# Patient Record
Sex: Male | Born: 1955
Health system: Southern US, Community
[De-identification: ages and names within clinical notes are randomized; demographics above are authoritative.]

## PROBLEM LIST (undated history)

## (undated) DIAGNOSIS — I499 Cardiac arrhythmia, unspecified: Secondary | ICD-10-CM

## (undated) DIAGNOSIS — I2510 Atherosclerotic heart disease of native coronary artery without angina pectoris: Secondary | ICD-10-CM

## (undated) DIAGNOSIS — R42 Dizziness and giddiness: Secondary | ICD-10-CM

## (undated) DIAGNOSIS — S72001A Fracture of unspecified part of neck of right femur, initial encounter for closed fracture: Secondary | ICD-10-CM

## (undated) DIAGNOSIS — K7460 Unspecified cirrhosis of liver: Secondary | ICD-10-CM

## (undated) DIAGNOSIS — I10 Essential (primary) hypertension: Secondary | ICD-10-CM

## (undated) DIAGNOSIS — K219 Gastro-esophageal reflux disease without esophagitis: Secondary | ICD-10-CM

## (undated) DIAGNOSIS — M1711 Unilateral primary osteoarthritis, right knee: Secondary | ICD-10-CM

## (undated) DIAGNOSIS — R9431 Abnormal electrocardiogram [ECG] [EKG]: Secondary | ICD-10-CM

## (undated) DIAGNOSIS — I4891 Unspecified atrial fibrillation: Secondary | ICD-10-CM

## (undated) DIAGNOSIS — R7989 Other specified abnormal findings of blood chemistry: Secondary | ICD-10-CM

## (undated) DIAGNOSIS — R945 Abnormal results of liver function studies: Secondary | ICD-10-CM

## (undated) DIAGNOSIS — K769 Liver disease, unspecified: Secondary | ICD-10-CM

## (undated) DIAGNOSIS — M1990 Unspecified osteoarthritis, unspecified site: Secondary | ICD-10-CM

## (undated) DIAGNOSIS — M1710 Unilateral primary osteoarthritis, unspecified knee: Secondary | ICD-10-CM

## (undated) DIAGNOSIS — Z7289 Other problems related to lifestyle: Secondary | ICD-10-CM

## (undated) DIAGNOSIS — D696 Thrombocytopenia, unspecified: Secondary | ICD-10-CM

## (undated) DIAGNOSIS — S72009A Fracture of unspecified part of neck of unspecified femur, initial encounter for closed fracture: Secondary | ICD-10-CM

## (undated) DIAGNOSIS — E785 Hyperlipidemia, unspecified: Secondary | ICD-10-CM

## (undated) HISTORY — DX: Abnormal results of liver function studies: R94.5

## (undated) HISTORY — DX: Fracture of unspecified part of neck of unspecified femur, initial encounter for closed fracture: S72.009A

## (undated) HISTORY — DX: Liver disease, unspecified: K76.9

## (undated) HISTORY — DX: Other specified abnormal findings of blood chemistry: R79.89

## (undated) HISTORY — DX: Essential (primary) hypertension: I10

## (undated) HISTORY — DX: Unilateral primary osteoarthritis, right knee: M17.11

## (undated) HISTORY — DX: Atherosclerotic heart disease of native coronary artery without angina pectoris: I25.10

## (undated) HISTORY — DX: Thrombocytopenia, unspecified: D69.6

## (undated) HISTORY — DX: Fracture of unspecified part of neck of right femur, initial encounter for closed fracture: S72.001A

## (undated) HISTORY — DX: Hyperlipidemia, unspecified: E78.5

## (undated) HISTORY — DX: Other problems related to lifestyle: Z72.89

## (undated) HISTORY — DX: Dizziness and giddiness: R42

## (undated) HISTORY — DX: Abnormal electrocardiogram (ECG) (EKG): R94.31

## (undated) HISTORY — PX: HERNIA REPAIR: SHX51

## (undated) HISTORY — DX: Unilateral primary osteoarthritis, unspecified knee: M17.10

## (undated) HISTORY — PX: FRACTURE SURGERY: SHX138

## (undated) HISTORY — DX: Unspecified cirrhosis of liver: K74.60

## (undated) HISTORY — PX: SHOULDER SURGERY: SHX246

---

## 1998-08-30 ENCOUNTER — Ambulatory Visit (HOSPITAL_BASED_OUTPATIENT_CLINIC_OR_DEPARTMENT_OTHER): Admission: RE | Admit: 1998-08-30 | Discharge: 1998-08-30 | Payer: Self-pay | Admitting: General Surgery

## 2012-05-24 ENCOUNTER — Encounter: Payer: Self-pay | Admitting: Internal Medicine

## 2012-05-24 ENCOUNTER — Encounter: Payer: Self-pay | Admitting: *Deleted

## 2012-05-24 ENCOUNTER — Ambulatory Visit (INDEPENDENT_AMBULATORY_CARE_PROVIDER_SITE_OTHER): Payer: BC Managed Care – PPO | Admitting: Internal Medicine

## 2012-05-24 VITALS — BP 140/84 | HR 68 | Ht 73.0 in | Wt 205.0 lb

## 2012-05-24 DIAGNOSIS — R931 Abnormal findings on diagnostic imaging of heart and coronary circulation: Secondary | ICD-10-CM

## 2012-05-24 DIAGNOSIS — R9389 Abnormal findings on diagnostic imaging of other specified body structures: Secondary | ICD-10-CM

## 2012-05-24 DIAGNOSIS — I10 Essential (primary) hypertension: Secondary | ICD-10-CM

## 2012-05-24 LAB — LIPID PANEL
Cholesterol: 226 mg/dL — ABNORMAL HIGH (ref 0–200)
HDL: 107.3 mg/dL (ref >39.00)
Total CHOL/HDL Ratio: 2
Triglycerides: 51 mg/dL (ref 0.0–149.0)
VLDL: 10.2 mg/dL (ref 0.0–40.0)

## 2012-05-24 LAB — LDL CHOLESTEROL, DIRECT: Direct LDL: 108.9 mg/dL

## 2012-05-24 NOTE — Progress Notes (Signed)
HPI Patient is a 57 yo wh was referred from [facility] family medicine for cardiac evaluation. The patient has a histroy of HTN  He was seen in February.  EKG was done that was felt to be abnormal. Cardiac CT was done that showed a calcium score of 784.  LAD was 553; LCx was 295  LM 26.  The patient denies CP  No SOB  He is active.  No Known Allergies  Current Outpatient Prescriptions  Medication Sig Dispense Refill  . aspirin 81 MG tablet Take 81 mg by mouth daily.      . naproxen sodium (ANAPROX) 220 MG tablet Take 220 mg by mouth 2 (two) times daily with a meal.      . amLODipine-benazepril (LOTREL) 5-20 MG per capsule Take 1 capsule by mouth daily.       No current facility-administered medications for this visit.    Past Medical History  Diagnosis Date  . Hypertension   . Elevated liver function tests   . Chronic liver disease   . Abnormal EKG   . Dizziness     No past surgical history on file.  No family history on file.  History   Social History  . Marital Status: Divorced    Spouse Name: N/A    Number of Children: N/A  . Years of Education: N/A   Occupational History  . Not on file.   Social History Main Topics  . Smoking status: Never Smoker   . Smokeless tobacco: Not on file  . Alcohol Use: Not on file  . Drug Use: Not on file  . Sexually Active: Not on file   Other Topics Concern  . Not on file   Social History Narrative  . No narrative on file    Review of Systems:  All systems reviewed.  They are negative to the above problem except as previously stated.  Vital Signs: BP 140/84  Pulse 68  Ht 6\' 1"  (1.854 m)  Wt 205 lb (92.987 kg)  BMI 27.05 kg/m2  Physical Exam Patient is in NAD HEENT:  Normocephalic, atraumatic. EOMI, PERRLA.  Neck: JVP is normal.  No bruits.  Lungs: clear to auscultation. No rales no wheezes.  Heart: Regular rate and rhythm. Normal S1, S2. No S3.   No significant murmurs. PMI not displaced.  Abdomen:  Supple, nontender.  Normal bowel sounds. No masses. No hepatomegaly.  Extremities:   Good distal pulses throughout. No lower extremity edema.  Musculoskeletal :moving all extremities.  Neuro:   alert and oriented x3.  CN II-XII grossly intact.  EKG  SR 68 bpm.  Assessment and Plan:  1.  CAD  The patient's recent CT shows indeed that he has CAD  He is asymptomatic. I would recomm a stress myoview to evaluate for ischemia  2.  HTN  Continue meds  3.  HL  WIll need fasting lipids today  He had coffee this AM.

## 2012-05-24 NOTE — Patient Instructions (Addendum)
LABS TODAY:  Lipid Panel  Your physician has requested that you have an exercise stress myoview. For further information please visit [email or web address] Please follow instruction sheet, as given.

## 2012-05-26 ENCOUNTER — Other Ambulatory Visit: Payer: Self-pay | Admitting: *Deleted

## 2012-05-26 ENCOUNTER — Telehealth: Payer: Self-pay | Admitting: Internal Medicine

## 2012-05-26 ENCOUNTER — Encounter: Payer: Self-pay | Admitting: Internal Medicine

## 2012-05-26 DIAGNOSIS — E785 Hyperlipidemia, unspecified: Secondary | ICD-10-CM

## 2012-05-26 MED ORDER — ATORVASTATIN CALCIUM 20 MG PO TABS: 20.0000 mg | ORAL_TABLET | Freq: Every day | ORAL | Status: DC

## 2012-05-26 NOTE — Telephone Encounter (Signed)
New Problem:    Patient's wife called in wanting to know if Dr. Tenny Craw had decided to order cholesterolol medication for her husband.  Please call back.

## 2012-05-26 NOTE — Telephone Encounter (Signed)
See lab results.  

## 2012-06-08 ENCOUNTER — Ambulatory Visit (HOSPITAL_COMMUNITY): Payer: BC Managed Care – PPO | Attending: Cardiology | Admitting: Radiology

## 2012-06-08 VITALS — BP 161/76 | HR 70 | Ht 73.0 in | Wt 200.0 lb

## 2012-06-08 DIAGNOSIS — R931 Abnormal findings on diagnostic imaging of heart and coronary circulation: Secondary | ICD-10-CM

## 2012-06-08 DIAGNOSIS — R42 Dizziness and giddiness: Secondary | ICD-10-CM | POA: Insufficient documentation

## 2012-06-08 DIAGNOSIS — R9439 Abnormal result of other cardiovascular function study: Secondary | ICD-10-CM

## 2012-06-08 DIAGNOSIS — E785 Hyperlipidemia, unspecified: Secondary | ICD-10-CM | POA: Insufficient documentation

## 2012-06-08 DIAGNOSIS — R9389 Abnormal findings on diagnostic imaging of other specified body structures: Secondary | ICD-10-CM | POA: Insufficient documentation

## 2012-06-08 DIAGNOSIS — I10 Essential (primary) hypertension: Secondary | ICD-10-CM | POA: Insufficient documentation

## 2012-06-08 MED ORDER — TECHNETIUM TC 99M SESTAMIBI GENERIC - CARDIOLITE
11.0000 | Freq: Once | INTRAVENOUS | Status: AC | PRN
  Administered 2012-06-08: 11 via INTRAVENOUS

## 2012-06-08 MED ORDER — TECHNETIUM TC 99M SESTAMIBI GENERIC - CARDIOLITE
33.0000 | Freq: Once | INTRAVENOUS | Status: AC | PRN
  Administered 2012-06-08: 33 via INTRAVENOUS

## 2012-06-08 NOTE — Progress Notes (Signed)
[facility] SITE 3 NUCLEAR MED [address] [place], [place] [address] [phone]    Cardiology Nuclear Med Study  EESA JUSTISS is a 57 y.o. male     MRN : [id number]     DOB: 1956/02/11  Procedure Date: 06/08/2012  Nuclear Med Background Indication for Stress Test:  Evaluation for Ischemia, and Abnormal Cardiac CT History:  2/14 Cardiac OZ:HYQMVHQ score=784 with calcification in LAD, LCX and LM Cardiac Risk Factors: Hypertension and Lipids  Symptoms:  No cardiac complaints, other than dizziness he relates to his BP medicine.   Nuclear Pre-Procedure Caffeine/Decaff Intake:  None > 12 hrs NPO After: 8:00pm   Lungs:  Clear. O2 Sat: 97% on room air. IV 0.9% NS with Angio Cath:  20g  IV Site: R Antecubital x 1, tolerated well IV Started by:  Irean Hong, RN  Chest Size (in):  42 Cup Size: n/a  Height: 6\' 1"  (1.854 m)  Weight:  200 lb (90.719 kg)  BMI:  Body mass index is 26.39 kg/(m^2). Tech Comments:  n/a    Nuclear Med Study 1 or 2 day study: 1 day  Stress Test Type:  Stress  Reading MD: Marca Ancona, MD  Order Authorizing Provider:  Dietrich Pates, MD  Resting Radionuclide: Technetium 67m Sestamibi  Resting Radionuclide Dose: 11.0 mCi   Stress Radionuclide:  Technetium 47m Sestamibi  Stress Radionuclide Dose: 33.0 mCi           Stress Protocol Rest HR: 70 Stress HR: 144  Rest BP: 161/76 Stress BP: 207/85  Exercise Time (min): 4:00 METS: 5.3   Predicted Max HR: 163 bpm % Max HR: 88.34 bpm Rate Pressure Product: 46962   Dose of Adenosine (mg):  n/a Dose of Lexiscan: n/a mg  Dose of Atropine (mg): n/a Dose of Dobutamine: n/a mcg/kg/min (at max HR)  Stress Test Technologist: Smiley Houseman, CMA-N  Nuclear Technologist:  Domenic Polite, CNMT     Rest Procedure:  Myocardial perfusion imaging was performed at rest 45 minutes following the intravenous administration of Technetium 58m Sestamibi.  Rest ECG: NSR - Normal EKG  Stress Procedure:  The patient  exercised on the treadmill utilizing the Bruce Protocol for four minutes. The patient stopped due to right knee pain and denied any chest pain.  Technetium 20m Sestamibi was injected at peak exercise and myocardial perfusion imaging was performed after a brief delay.  Stress ECG: No significant change from baseline ECG  QPS Raw Data Images:  Normal; no motion artifact; normal heart/lung ratio. Stress Images:  Small, mild basal inferior perfusion defect.  Rest Images:  Small, mild basal inferior perfusion defect.  Subtraction (SDS):  Fixed small, mild basal inferior perfusion defect.  Transient Ischemic Dilatation (Normal <1.22):  0.89 Lung/Heart Ratio (Normal <0.45):  0.32  Quantitative Gated Spect Images QGS EDV:  132 ml QGS ESV:  49 ml  Impression Exercise Capacity:  Poor exercise capacity. BP Response:  Hypertensive blood pressure response. Clinical Symptoms:  Knee pain ECG Impression:  No significant ST segment change suggestive of ischemia. Comparison with Prior Nuclear Study: No previous nuclear study performed  Overall Impression:  Low risk stress nuclear study.  Fixed small mild basal inferior perfusion defect with normal wall motion.  Suspect this is most likely diaphragmatic attenuation.  No ischemia.   LV Ejection Fraction: 63%.  LV Wall Motion:  NL LV Function; NL Wall Motion  Marca Ancona 06/08/2012

## 2012-06-15 ENCOUNTER — Telehealth: Payer: Self-pay | Admitting: Internal Medicine

## 2012-06-15 NOTE — Telephone Encounter (Signed)
New problem:  Test results.  

## 2012-06-16 NOTE — Telephone Encounter (Signed)
Pt.notified

## 2012-07-22 ENCOUNTER — Other Ambulatory Visit (INDEPENDENT_AMBULATORY_CARE_PROVIDER_SITE_OTHER): Payer: BC Managed Care – PPO

## 2012-07-22 DIAGNOSIS — E785 Hyperlipidemia, unspecified: Secondary | ICD-10-CM

## 2012-07-22 LAB — LIPID PANEL
Cholesterol: 209 mg/dL — ABNORMAL HIGH (ref 0–200)
HDL: 140.1 mg/dL (ref >39.00)
Total CHOL/HDL Ratio: 1
Triglycerides: 36 mg/dL (ref 0.0–149.0)
VLDL: 7.2 mg/dL (ref 0.0–40.0)

## 2012-07-22 LAB — AST: AST: 97 U/L — ABNORMAL HIGH (ref 0–37)

## 2012-07-22 LAB — LDL CHOLESTEROL, DIRECT: Direct LDL: 58.5 mg/dL

## 2012-07-29 ENCOUNTER — Telehealth: Payer: Self-pay | Admitting: Internal Medicine

## 2012-07-29 ENCOUNTER — Other Ambulatory Visit: Payer: Self-pay | Admitting: *Deleted

## 2012-07-29 DIAGNOSIS — E785 Hyperlipidemia, unspecified: Secondary | ICD-10-CM

## 2012-07-29 MED ORDER — ATORVASTATIN CALCIUM 20 MG PO TABS: 10.0000 mg | ORAL_TABLET | Freq: Every day | ORAL | Status: DC

## 2012-07-29 NOTE — Telephone Encounter (Signed)
Spoke with pt

## 2012-07-29 NOTE — Telephone Encounter (Signed)
New problem   Pt want to know if his atorvastatin is going to be change. Please call pt

## 2014-02-18 ENCOUNTER — Encounter (HOSPITAL_COMMUNITY): Payer: Self-pay | Admitting: Emergency Medicine

## 2014-02-18 ENCOUNTER — Emergency Department (HOSPITAL_COMMUNITY): Payer: BC Managed Care – PPO

## 2014-02-18 ENCOUNTER — Inpatient Hospital Stay (HOSPITAL_COMMUNITY)
Admission: EM | Admit: 2014-02-18 | Discharge: 2014-02-20 | DRG: 482 | Disposition: A | Discharge status: Home / Self Care | Payer: BC Managed Care – PPO | Attending: Internal Medicine | Admitting: Internal Medicine

## 2014-02-18 DIAGNOSIS — F1090 Alcohol use, unspecified, uncomplicated: Secondary | ICD-10-CM | POA: Diagnosis present

## 2014-02-18 DIAGNOSIS — S72009A Fracture of unspecified part of neck of unspecified femur, initial encounter for closed fracture: Secondary | ICD-10-CM

## 2014-02-18 DIAGNOSIS — R9431 Abnormal electrocardiogram [ECG] [EKG]: Secondary | ICD-10-CM | POA: Diagnosis present

## 2014-02-18 DIAGNOSIS — F1099 Alcohol use, unspecified with unspecified alcohol-induced disorder: Secondary | ICD-10-CM

## 2014-02-18 DIAGNOSIS — Z419 Encounter for procedure for purposes other than remedying health state, unspecified: Secondary | ICD-10-CM

## 2014-02-18 DIAGNOSIS — Z789 Other specified health status: Secondary | ICD-10-CM | POA: Diagnosis present

## 2014-02-18 DIAGNOSIS — S72001A Fracture of unspecified part of neck of right femur, initial encounter for closed fracture: Secondary | ICD-10-CM | POA: Diagnosis present

## 2014-02-18 DIAGNOSIS — K769 Liver disease, unspecified: Secondary | ICD-10-CM | POA: Diagnosis present

## 2014-02-18 DIAGNOSIS — S72141A Displaced intertrochanteric fracture of right femur, initial encounter for closed fracture: Secondary | ICD-10-CM | POA: Diagnosis present

## 2014-02-18 DIAGNOSIS — Z7982 Long term (current) use of aspirin: Secondary | ICD-10-CM

## 2014-02-18 DIAGNOSIS — W000XXA Fall on same level due to ice and snow, initial encounter: Secondary | ICD-10-CM | POA: Diagnosis present

## 2014-02-18 DIAGNOSIS — I10 Essential (primary) hypertension: Secondary | ICD-10-CM | POA: Diagnosis present

## 2014-02-18 DIAGNOSIS — Z7289 Other problems related to lifestyle: Secondary | ICD-10-CM | POA: Diagnosis present

## 2014-02-18 DIAGNOSIS — K5900 Constipation, unspecified: Secondary | ICD-10-CM | POA: Diagnosis present

## 2014-02-18 HISTORY — DX: Fracture of unspecified part of neck of right femur, initial encounter for closed fracture: S72.001A

## 2014-02-18 HISTORY — DX: Other specified health status: Z78.9

## 2014-02-18 HISTORY — DX: Other problems related to lifestyle: Z72.89

## 2014-02-18 HISTORY — DX: Unspecified osteoarthritis, unspecified site: M19.90

## 2014-02-18 HISTORY — DX: Alcohol use, unspecified, uncomplicated: F10.90

## 2014-02-18 LAB — I-STAT CHEM 8, ED
BUN: 15 mg/dL (ref 6–23)
Calcium, Ion: 1.27 mmol/L — ABNORMAL HIGH (ref 1.12–1.23)
Chloride: 106 mEq/L (ref 96–112)
Creatinine, Ser: 0.6 mg/dL (ref 0.50–1.35)
Glucose, Bld: 116 mg/dL — ABNORMAL HIGH (ref 70–99)
HCT: 41 % (ref 39.0–52.0)
Hemoglobin: 13.9 g/dL (ref 13.0–17.0)
Potassium: 4.3 mmol/L (ref 3.5–5.1)
Sodium: 139 mmol/L (ref 135–145)
TCO2: 20 mmol/L (ref 0–100)

## 2014-02-18 LAB — URINALYSIS, ROUTINE W REFLEX MICROSCOPIC
Bilirubin Urine: NEGATIVE
Glucose, UA: NEGATIVE mg/dL
Hgb urine dipstick: NEGATIVE
Ketones, ur: 15 mg/dL — AB
Leukocytes, UA: NEGATIVE
Nitrite: NEGATIVE
Protein, ur: NEGATIVE mg/dL
Specific Gravity, Urine: 1.016 (ref 1.005–1.030)
Urobilinogen, UA: 0.2 mg/dL (ref 0.0–1.0)
pH: 6 (ref 5.0–8.0)

## 2014-02-18 LAB — BASIC METABOLIC PANEL
Anion gap: 9 (ref 5–15)
BUN: 10 mg/dL (ref 6–23)
CO2: 22 mmol/L (ref 19–32)
Calcium: 9.2 mg/dL (ref 8.4–10.5)
Chloride: 106 mEq/L (ref 96–112)
Creatinine, Ser: 0.66 mg/dL (ref 0.50–1.35)
GFR calc Af Amer: >90 mL/min (ref >90)
GFR calc non Af Amer: >90 mL/min (ref >90)
Glucose, Bld: 110 mg/dL — ABNORMAL HIGH (ref 70–99)
Potassium: 4.2 mmol/L (ref 3.5–5.1)
Sodium: 137 mmol/L (ref 135–145)

## 2014-02-18 LAB — CBC WITH DIFFERENTIAL/PLATELET
Basophils Absolute: 0 10*3/uL (ref 0.0–0.1)
Basophils Relative: 0 % (ref 0–1)
Eosinophils Absolute: 0.2 10*3/uL (ref 0.0–0.7)
Eosinophils Relative: 2 % (ref 0–5)
HCT: 38.1 % — ABNORMAL LOW (ref 39.0–52.0)
Hemoglobin: 13 g/dL (ref 13.0–17.0)
Lymphocytes Relative: 18 % (ref 12–46)
Lymphs Abs: 1.2 10*3/uL (ref 0.7–4.0)
MCH: 31.1 pg (ref 26.0–34.0)
MCHC: 34.1 g/dL (ref 30.0–36.0)
MCV: 91.1 fL (ref 78.0–100.0)
Monocytes Absolute: 0.7 10*3/uL (ref 0.1–1.0)
Monocytes Relative: 10 % (ref 3–12)
Neutro Abs: 4.5 10*3/uL (ref 1.7–7.7)
Neutrophils Relative %: 70 % (ref 43–77)
Platelets: 104 10*3/uL — ABNORMAL LOW (ref 150–400)
RBC: 4.18 MIL/uL — ABNORMAL LOW (ref 4.22–5.81)
RDW: 13.9 % (ref 11.5–15.5)
WBC: 6.5 10*3/uL (ref 4.0–10.5)

## 2014-02-18 LAB — COMPREHENSIVE METABOLIC PANEL
ALT: 29 U/L (ref 0–53)
AST: 35 U/L (ref 0–37)
Albumin: 3.5 g/dL (ref 3.5–5.2)
Alkaline Phosphatase: 103 U/L (ref 39–117)
Anion gap: 8 (ref 5–15)
BUN: 11 mg/dL (ref 6–23)
CO2: 23 mmol/L (ref 19–32)
Calcium: 9.4 mg/dL (ref 8.4–10.5)
Chloride: 108 mEq/L (ref 96–112)
Creatinine, Ser: 0.68 mg/dL (ref 0.50–1.35)
GFR calc Af Amer: >90 mL/min (ref >90)
GFR calc non Af Amer: >90 mL/min (ref >90)
Glucose, Bld: 111 mg/dL — ABNORMAL HIGH (ref 70–99)
Potassium: 4.2 mmol/L (ref 3.5–5.1)
Sodium: 139 mmol/L (ref 135–145)
Total Bilirubin: 1.1 mg/dL (ref 0.3–1.2)
Total Protein: 6.2 g/dL (ref 6.0–8.3)

## 2014-02-18 LAB — TYPE AND SCREEN
ABO/RH(D): B POS
Antibody Screen: NEGATIVE

## 2014-02-18 LAB — PROTIME-INR
INR: 1.32 (ref 0.00–1.49)
Prothrombin Time: 16.5 seconds — ABNORMAL HIGH (ref 11.6–15.2)

## 2014-02-18 MED ORDER — SODIUM CHLORIDE 0.9 % IV SOLN
INTRAVENOUS | Status: DC
  Administered 2014-02-18 – 2014-02-20 (×2): 75 mL/h via INTRAVENOUS

## 2014-02-18 MED ORDER — ONDANSETRON HCL 4 MG PO TABS: 4.0000 mg | ORAL_TABLET | Freq: Four times a day (QID) | ORAL | Status: DC | PRN

## 2014-02-18 MED ORDER — ONDANSETRON 4 MG PO TBDP
4.0000 mg | ORAL_TABLET | Freq: Once | ORAL | Status: AC
  Administered 2014-02-18: 4 mg via ORAL
  Filled 2014-02-18: qty 1

## 2014-02-18 MED ORDER — OXYCODONE HCL 5 MG PO TABS
5.0000 mg | ORAL_TABLET | ORAL | Status: DC | PRN
  Administered 2014-02-19: 5 mg via ORAL
  Filled 2014-02-18: qty 1

## 2014-02-18 MED ORDER — ACETAMINOPHEN 650 MG RE SUPP: 650.0000 mg | Freq: Four times a day (QID) | RECTAL | Status: DC | PRN

## 2014-02-18 MED ORDER — ALUM & MAG HYDROXIDE-SIMETH 200-200-20 MG/5ML PO SUSP: 30.0000 mL | Freq: Four times a day (QID) | ORAL | Status: DC | PRN

## 2014-02-18 MED ORDER — ACETAMINOPHEN 325 MG PO TABS: 650.0000 mg | ORAL_TABLET | Freq: Four times a day (QID) | ORAL | Status: DC | PRN

## 2014-02-18 MED ORDER — HYDRALAZINE HCL 20 MG/ML IJ SOLN: 10.0000 mg | Freq: Four times a day (QID) | INTRAMUSCULAR | Status: DC | PRN

## 2014-02-18 MED ORDER — ONDANSETRON HCL 4 MG/2ML IJ SOLN
4.0000 mg | Freq: Four times a day (QID) | INTRAMUSCULAR | Status: DC | PRN
  Administered 2014-02-18 – 2014-02-19 (×2): 4 mg via INTRAVENOUS
  Filled 2014-02-18: qty 2

## 2014-02-18 MED ORDER — ATORVASTATIN CALCIUM 10 MG PO TABS
10.0000 mg | ORAL_TABLET | Freq: Every day | ORAL | Status: DC
  Administered 2014-02-20: 10 mg via ORAL
  Filled 2014-02-18 (×2): qty 1

## 2014-02-18 MED ORDER — HYDROCODONE-ACETAMINOPHEN 5-325 MG PO TABS
2.0000 | ORAL_TABLET | Freq: Once | ORAL | Status: AC
  Administered 2014-02-18: 2 via ORAL
  Filled 2014-02-18: qty 2

## 2014-02-18 MED ORDER — HYDROMORPHONE HCL 1 MG/ML IJ SOLN
0.5000 mg | INTRAMUSCULAR | Status: DC | PRN
  Administered 2014-02-18 – 2014-02-19 (×3): 1 mg via INTRAVENOUS
  Filled 2014-02-18 (×3): qty 1

## 2014-02-18 NOTE — ED Notes (Signed)
Per EMS, pt slipped and fell yesterday. Was working in yard today and felt R hip pop and had trouble bearing weight after that.

## 2014-02-18 NOTE — ED Provider Notes (Signed)
CSN: [id number]     Arrival date & time 02/18/14  1700 History   First MD Initiated Contact with Patient 02/18/14 1721     Chief Complaint  Patient presents with  . Hip Pain  . Fall     (Consider location/radiation/quality/duration/timing/severity/associated sxs/prior Treatment) HPI   Social 58 year old male who presents the emergency department with chief complaint of hip pain. Patient states that 2 nights ago he slipped coming in the door and fell directly onto his right hip and right elbow. Patient states he was sore and had pain, however, he was able to ambulate. Yesterday the patient was outside moving a large rock in his front yard when he lifted, he felt a pop in his right leg. He had immediate severe pain and was unable to ambulate on the leg. The patient states that his riding lawnmower was nearby and he rode it toward the house. He said his pain was diminished somewhat and he got up to stand on the leg when it popped again. Patient held onto the lawn mower and called for his wife who brought him up here for crutches. The patient recently had a left knee arthroscopy and had left over Norco which he took that helped relieve his pain somewhat. Pain is worse with movements of the hip. He has pain on the lateral hip as well as radiating into the left groin. He is still unable to bear weight on the hip. He denies any numbness or tingling. He has a history of right knee osteoarthritis as scheduled for arthroscopy.  Past Medical History  Diagnosis Date  . Hypertension   . Elevated liver function tests   . Chronic liver disease   . Abnormal EKG   . Dizziness    History reviewed. No pertinent past surgical history. History reviewed. No pertinent family history. History  Substance Use Topics  . Smoking status: Never Smoker   . Smokeless tobacco: Not on file  . Alcohol Use: Not on file    Review of Systems  Ten systems reviewed and are negative for acute change, except as noted in  the HPI.   Allergies  Review of patient's allergies indicates no known allergies.  Home Medications   Prior to Admission medications   Medication Sig Start Date End Date Taking? Authorizing Provider  amLODipine-benazepril (LOTREL) 5-20 MG per capsule Take 1 capsule by mouth daily.   Yes Historical Provider, MD  aspirin 81 MG tablet Take 81 mg by mouth daily.   Yes Historical Provider, MD  atorvastatin (LIPITOR) 10 MG tablet Take 10 mg by mouth daily. 11/21/13  Yes Historical Provider, MD  HYDROcodone-acetaminophen (NORCO/VICODIN) 5-325 MG per tablet Take 1-2 tablets by mouth every 6 (six) hours as needed for moderate pain.   Yes Historical Provider, MD  meloxicam (MOBIC) 15 MG tablet Take 15 mg by mouth daily.   Yes Historical Provider, MD  naproxen sodium (ANAPROX) 220 MG tablet Take 440 mg by mouth 2 (two) times daily with a meal.    Yes Historical Provider, MD  atorvastatin (LIPITOR) 20 MG tablet Take 0.5 tablets (10 mg total) by mouth daily. Patient not taking: Reported on 02/18/2014 07/29/12   Fay Records, MD   BP 170/86 mmHg  Pulse 89  SpO2 97% Physical Exam  Constitutional: He appears well-developed and well-nourished. No distress.  HENT:  Head: Normocephalic and atraumatic.  Eyes: Conjunctivae are normal. No scleral icterus.  Neck: Normal range of motion. Neck supple.  Cardiovascular: Normal rate, regular rhythm and  normal heart sounds.   Pulmonary/Chest: Effort normal and breath sounds normal. No respiratory distress.  Abdominal: Soft. There is no tenderness.  Musculoskeletal: He exhibits no edema.       Legs: Right hip examination is performed. There is a 2 inch right leg length inequality with the right leg being shorter. Patient is unsure if this is old or new. There is no tenderness to palpation of the hip, however there is pain with internal and external rotation and flexion. Pain is worse with extension of the hip. Pulses and sensation intact. Strength is decreased due  to pain.  Neurological: He is alert.  Skin: Skin is warm and dry. He is not diaphoretic.  Psychiatric: His behavior is normal.  Nursing note and vitals reviewed.   ED Course  Procedures (including critical care time) Labs Review Labs Reviewed - No data to display  Imaging Review No results found.   EKG Interpretation None      MDM   Final diagnoses:  None    Patient here with complaint of right hip pain after injury. We'll obtain a hip x-ray. I have given the patient pain medication.  8:42 PM Patient with intertrochanteric right hip fracture. I spoken with Dr. Tamera Punt, who is on call for [facility] with the patient is established as a patient. He asked that the patient remain nothing by mouth after midnight and he will see the patient first thing in the morning to schedule his surgery for tomorrow. I have discussed the findings with the patient who understands his diagnosis and agrees with plan of care. Patient's other labs are currently pending.  ? Pathologic fracture.  Patient admitted by Dr. Arnoldo Morale. I have discussed the patient with Dr. Canary Brim. Patient admitted. To medsurege. I personally reviewed the imaging tests through PACS system. I have reviewed and interpreted Lab values. I reviewed available ER/hospitalization records through the EMR  The patient appears reasonably stabilized for admission considering the current resources, flow, and capabilities available in the ED at this time, and I doubt any other [facility] requiring further screening and/or treatment in the ED prior to admission.   Margarita Mail, PA-C 02/19/14 [place], MD 02/19/14 [phone]

## 2014-02-18 NOTE — H&P (Signed)
[facility] Admission History and Physical       DESTAN FRANCHINI QVZ:[id number] DOB: 1956/02/07 DOA: 02/18/2014  Referring physician: EDP PCP: Vicenta Aly, [place]  Specialists:   Chief Complaint: Right Hip Pain    HPI: MATIN MATTIOLI is a 58 y.o. male with a history of HTN who presents to the ED with increased Right Hip pain and being unable to bear weight on his right leg today since an initial fall 2 days ago.   He reports the fall occurred when he slipped on his wet porch and fell onto his right side. He reports having pain on his right hip and elbow, but he was able to walk  At that time.  He reports that he was working in his yard today and he was squatting to pick up a boulder in his yard and when he did he heard a pop in his hip and he had increased pain.   He straightened up and heard another pop and head increased pain in his right hip  and could not bear weight on his right leg and foot.   He reports that he got on his riding Conservation officer, nature and rode back to his house and told his wife that he needed to go to the hospital.    He was evaluated in the ED and was found to have a right Intertrochanteric fracture and Orthopedics on Call for Dr. [name] were contacted for consultation, and the plan is for him to be NPO after midnight for surgery in the AM.        Review of Systems:  Constitutional: No Weight Loss, No Weight Gain, Night Sweats, Fevers, Chills, Dizziness, Fatigue, or Generalized Weakness HEENT: No Headaches, Difficulty Swallowing,Tooth/Dental Problems,Sore Throat,  No Sneezing, Rhinitis, Ear Ache, Nasal Congestion, or Post Nasal Drip,  Cardio-vascular:  No Chest pain, Orthopnea, PND, Edema in Lower Extremities, Anasarca, Dizziness, Palpitations  Resp: No Dyspnea, No DOE, No Productive Cough, No Non-Productive Cough, No Hemoptysis, No Wheezing.    GI: No Heartburn, Indigestion, Abdominal Pain, Nausea, Vomiting, Diarrhea, Hematemesis, Hematochezia, Melena,  Change in Bowel Habits,  Loss of Appetite  GU: No Dysuria, Change in Color of Urine, No Urgency or Frequency, No Flank pain.  Musculoskeletal: +Right Hip Pain, No Decreased Range of Motion, No Back Pain.  Neurologic: No Syncope, No Seizures, Muscle Weakness, Paresthesia, Vision Disturbance or Loss, No Diplopia, No Vertigo, No Difficulty Walking,  Skin: No Rash or Lesions. Psych: No Change in Mood or Affect, No Depression or Anxiety, No Memory loss, No Confusion, or Hallucinations   Past Medical History  Diagnosis Date  . Hypertension   . Elevated liver function tests   . Chronic liver disease   . Abnormal EKG   . Dizziness      History reviewed. No pertinent past surgical history.     Prior to Admission medications   Medication Sig Start Date End Date Taking? Authorizing Provider  amLODipine-benazepril (LOTREL) 5-20 MG per capsule Take 1 capsule by mouth daily.   Yes Historical Provider, MD  aspirin 81 MG tablet Take 81 mg by mouth daily.   Yes Historical Provider, MD  atorvastatin (LIPITOR) 10 MG tablet Take 10 mg by mouth daily. 11/21/13  Yes Historical Provider, MD  HYDROcodone-acetaminophen (NORCO/VICODIN) 5-325 MG per tablet Take 1-2 tablets by mouth every 6 (six) hours as needed for moderate pain.   Yes Historical Provider, MD  meloxicam (MOBIC) 15 MG tablet Take 15 mg by mouth daily.   Yes Historical  Provider, MD  naproxen sodium (ANAPROX) 220 MG tablet Take 440 mg by mouth 2 (two) times daily with a meal.    Yes Historical Provider, MD  atorvastatin (LIPITOR) 20 MG tablet Take 0.5 tablets (10 mg total) by mouth daily. Patient not taking: Reported on 02/18/2014 07/29/12   Fay Records, MD     No Known Allergies   Social History:  reports that he has never smoked. He does not have any smokeless tobacco history on file. His alcohol and drug histories are not on file.     History reviewed. No pertinent family history.     Physical Exam:  GEN:  Pleasant Well Nourished  and Well Developed 58 y.o. male  examined  and in no acute distress; cooperative with exam Filed Vitals:   02/18/14 1704 02/18/14 1829 02/18/14 1915 02/18/14 2130  BP:  146/83 130/83 142/81  Pulse: 89 69 69 70  Resp:  15    SpO2: 97% 97% 95% 96%   Blood pressure 142/81, pulse 70, resp. rate 15, SpO2 96 %. PSYCH: He is alert and oriented x4; does not appear anxious does not appear depressed; affect is normal HEENT: Normocephalic and Atraumatic, Mucous membranes pink; PERRLA; EOM intact; Fundi:  Benign;  No scleral icterus, Nares: Patent, Oropharynx: Clear, Fair Dentition,    Neck:  FROM, No Cervical Lymphadenopathy nor Thyromegaly or Carotid Bruit; No JVD; Breasts:: Not examined CHEST WALL: No tenderness CHEST: Normal respiration, clear to auscultation bilaterally HEART: Regular rate and rhythm; no murmurs rubs or gallops BACK: No kyphosis or scoliosis; No CVA tenderness ABDOMEN: Positive Bowel Sounds, Obese, Soft Non-Tender; No Masses, No Organomegaly, No Pannus; No Intertriginous candida. Rectal Exam: Not done EXTREMITIES: No Cyanosis, Clubbing, or Edema; No Ulcerations. Genitalia: not examined PULSES: 2+ and symmetric SKIN: Normal hydration no rash or ulceration CNS:  Alert and Oriented x 4,   No Focal Deficits Vascular: pulses palpable throughout    Labs on Admission:  Basic Metabolic Panel:  Recent Labs Lab 02/18/14 2021 02/18/14 2030  NA 139 137  K 4.3 4.2  CL 106 106  CO2  --  22  GLUCOSE 116* 110*  BUN 15 10  CREATININE 0.60 0.66  CALCIUM  --  9.2   Liver Function Tests: No results for input(s): AST, ALT, ALKPHOS, BILITOT, PROT, ALBUMIN in the last 168 hours. No results for input(s): LIPASE, AMYLASE in the last 168 hours. No results for input(s): AMMONIA in the last 168 hours. CBC:  Recent Labs Lab 02/18/14 2021 02/18/14 2030  WBC  --  6.5  NEUTROABS  --  4.5  HGB 13.9 13.0  HCT 41.0 38.1*  MCV  --  91.1  PLT  --  PENDING   Cardiac Enzymes: No  results for input(s): CKTOTAL, CKMB, CKMBINDEX, TROPONINI in the last 168 hours.  BNP (last 3 results) No results for input(s): PROBNP in the last 8760 hours. CBG: No results for input(s): GLUCAP in the last 168 hours.  Radiological Exams on Admission: Dg Hip Complete Right  02/18/2014   CLINICAL DATA:  Recent fall with right hip pain, additional encounter  EXAM: RIGHT HIP - COMPLETE 2+ VIEW  COMPARISON:  None.  FINDINGS: There is an intratrochanteric fracture of the proximal right femur identified with impaction and angulation at the fracture site. No other acute abnormality is noted. Pubic symphysis degenerative changes are seen.  IMPRESSION: Right intratrochanteric fracture   Electronically Signed   By: Inez Catalina M.D.   On: 02/18/2014 19:45  EKG: Ordered for Pre-Op.    Assessment/Plan:   58 y.o. male with  Principal Problem:   1.   Closed right hip fracture   NPO after Midnight   Ortho: Chandler et al. to see in AM   Pain Control PRN   Active Problems:   2.   Hypertension   PRN IV Hydralazine     3.   Chronic liver disease- Due to ETOH hx, reports cutting down, and havign improvement in LFTs,    Monitor LFTs     4.   Alcohol use- Reported Decrease, and reports no problems without Alcohol   CIWA Protocol , Monitor for Signs of Withdrawal    Institute PRN     5.   DVT Prophylaxis    SCDs    Code Status:   FULL CODE    Family Communication:    Wife at Bedside Disposition Plan:      Inpatient  Time spent:  [place] Pager [phone]   If [place] Please Contact the Day Rounding Team MD for [facility]  If 7PM-7AM, Please Contact Night-Floor Coverage  [email or web address] Password [facility] 02/18/2014, 9:39 PM

## 2014-02-19 ENCOUNTER — Encounter (HOSPITAL_COMMUNITY): Payer: Self-pay | Admitting: Anesthesiology

## 2014-02-19 ENCOUNTER — Inpatient Hospital Stay: Admit: 2014-02-19 | Payer: Self-pay | Admitting: Orthopedic Surgery

## 2014-02-19 ENCOUNTER — Inpatient Hospital Stay (HOSPITAL_COMMUNITY): Payer: BC Managed Care – PPO

## 2014-02-19 ENCOUNTER — Inpatient Hospital Stay (HOSPITAL_COMMUNITY): Payer: BC Managed Care – PPO | Admitting: Anesthesiology

## 2014-02-19 ENCOUNTER — Encounter (HOSPITAL_COMMUNITY)
Admission: EM | Disposition: A | Discharge status: Home / Self Care | Payer: Self-pay | Source: Home / Self Care | Attending: Internal Medicine

## 2014-02-19 DIAGNOSIS — K5900 Constipation, unspecified: Secondary | ICD-10-CM

## 2014-02-19 DIAGNOSIS — S72009A Fracture of unspecified part of neck of unspecified femur, initial encounter for closed fracture: Secondary | ICD-10-CM

## 2014-02-19 DIAGNOSIS — S72001D Fracture of unspecified part of neck of right femur, subsequent encounter for closed fracture with routine healing: Secondary | ICD-10-CM

## 2014-02-19 HISTORY — DX: Fracture of unspecified part of neck of unspecified femur, initial encounter for closed fracture: S72.009A

## 2014-02-19 HISTORY — PX: INTRAMEDULLARY (IM) NAIL INTERTROCHANTERIC: SHX5875

## 2014-02-19 HISTORY — PX: INTERTROCHANTERIC HIP FRACTURE SURGERY: SHX681

## 2014-02-19 LAB — CBC
HCT: 37.3 % — ABNORMAL LOW (ref 39.0–52.0)
Hemoglobin: 12.5 g/dL — ABNORMAL LOW (ref 13.0–17.0)
MCH: 31.7 pg (ref 26.0–34.0)
MCHC: 33.5 g/dL (ref 30.0–36.0)
MCV: 94.7 fL (ref 78.0–100.0)
Platelets: 104 10*3/uL — ABNORMAL LOW (ref 150–400)
RBC: 3.94 MIL/uL — ABNORMAL LOW (ref 4.22–5.81)
RDW: 14.2 % (ref 11.5–15.5)
WBC: 7.2 10*3/uL (ref 4.0–10.5)

## 2014-02-19 LAB — BASIC METABOLIC PANEL
Anion gap: 8 (ref 5–15)
BUN: 11 mg/dL (ref 6–23)
CO2: 23 mmol/L (ref 19–32)
Calcium: 9 mg/dL (ref 8.4–10.5)
Chloride: 107 mEq/L (ref 96–112)
Creatinine, Ser: 0.63 mg/dL (ref 0.50–1.35)
GFR calc Af Amer: >90 mL/min (ref >90)
GFR calc non Af Amer: >90 mL/min (ref >90)
Glucose, Bld: 93 mg/dL (ref 70–99)
Potassium: 4 mmol/L (ref 3.5–5.1)
Sodium: 138 mmol/L (ref 135–145)

## 2014-02-19 LAB — ABO/RH: ABO/RH(D): B POS

## 2014-02-19 LAB — MRSA PCR SCREENING: MRSA by PCR: NEGATIVE

## 2014-02-19 SURGERY — FIXATION, FRACTURE, INTERTROCHANTERIC, WITH INTRAMEDULLARY ROD
Anesthesia: General | Site: Hip | Laterality: Right

## 2014-02-19 MED ORDER — MIDAZOLAM HCL 2 MG/2ML IJ SOLN: 0.5000 mg | Freq: Once | INTRAMUSCULAR | Status: DC | PRN

## 2014-02-19 MED ORDER — HYDROCODONE-ACETAMINOPHEN 5-325 MG PO TABS: 1.0000 | ORAL_TABLET | ORAL | Status: DC | PRN

## 2014-02-19 MED ORDER — ONDANSETRON HCL 4 MG/2ML IJ SOLN: 4.0000 mg | Freq: Four times a day (QID) | INTRAMUSCULAR | Status: DC | PRN

## 2014-02-19 MED ORDER — POLYETHYLENE GLYCOL 3350 17 G PO PACK
17.0000 g | PACK | Freq: Every day | ORAL | Status: DC
Start: 2014-02-19 — End: 2014-02-20
  Administered 2014-02-19 – 2014-02-20 (×2): 17 g via ORAL
  Filled 2014-02-19 (×2): qty 1

## 2014-02-19 MED ORDER — EPHEDRINE SULFATE 50 MG/ML IJ SOLN
INTRAMUSCULAR | Status: DC | PRN
Start: 2014-02-19 — End: 2014-02-19
  Administered 2014-02-19 (×2): 5 mg via INTRAVENOUS

## 2014-02-19 MED ORDER — HYDROMORPHONE HCL 1 MG/ML IJ SOLN: 0.2500 mg | INTRAMUSCULAR | Status: DC | PRN

## 2014-02-19 MED ORDER — DEXAMETHASONE SODIUM PHOSPHATE 4 MG/ML IJ SOLN
INTRAMUSCULAR | Status: DC | PRN
  Administered 2014-02-19: 4 mg via INTRAVENOUS

## 2014-02-19 MED ORDER — DIPHENHYDRAMINE HCL 50 MG/ML IJ SOLN
INTRAMUSCULAR | Status: AC
  Filled 2014-02-19: qty 1

## 2014-02-19 MED ORDER — NEOSTIGMINE METHYLSULFATE 10 MG/10ML IV SOLN
INTRAVENOUS | Status: AC
  Filled 2014-02-19: qty 1

## 2014-02-19 MED ORDER — ASPIRIN EC 325 MG PO TBEC: 325.0000 mg | DELAYED_RELEASE_TABLET | Freq: Two times a day (BID) | ORAL | Status: DC

## 2014-02-19 MED ORDER — LIDOCAINE HCL (CARDIAC) 20 MG/ML IV SOLN
INTRAVENOUS | Status: DC | PRN
Start: 2014-02-19 — End: 2014-02-19
  Administered 2014-02-19: 35 mg via INTRAVENOUS

## 2014-02-19 MED ORDER — CEFAZOLIN SODIUM-DEXTROSE 2-3 GM-% IV SOLR
2.0000 g | Freq: Four times a day (QID) | INTRAVENOUS | Status: AC
  Administered 2014-02-19 (×2): 2 g via INTRAVENOUS
  Filled 2014-02-19 (×2): qty 50

## 2014-02-19 MED ORDER — MIDAZOLAM HCL 2 MG/2ML IJ SOLN
INTRAMUSCULAR | Status: AC
  Filled 2014-02-19: qty 2

## 2014-02-19 MED ORDER — MORPHINE SULFATE 2 MG/ML IJ SOLN: 0.5000 mg | INTRAMUSCULAR | Status: DC | PRN

## 2014-02-19 MED ORDER — PHENYLEPHRINE HCL 10 MG/ML IJ SOLN
10.0000 mg | INTRAVENOUS | Status: DC | PRN
  Administered 2014-02-19: 15 ug/min via INTRAVENOUS

## 2014-02-19 MED ORDER — AMLODIPINE BESY-BENAZEPRIL HCL 5-20 MG PO CAPS: 1.0000 | ORAL_CAPSULE | Freq: Every day | ORAL | Status: DC

## 2014-02-19 MED ORDER — CHLORHEXIDINE GLUCONATE 4 % EX LIQD
60.0000 mL | Freq: Once | CUTANEOUS | Status: AC
  Administered 2014-02-19: 4 via TOPICAL
  Filled 2014-02-19 (×3): qty 60

## 2014-02-19 MED ORDER — MENTHOL 3 MG MT LOZG: 1.0000 | LOZENGE | OROMUCOSAL | Status: DC | PRN

## 2014-02-19 MED ORDER — METOCLOPRAMIDE HCL 5 MG/ML IJ SOLN: 5.0000 mg | Freq: Three times a day (TID) | INTRAMUSCULAR | Status: DC | PRN

## 2014-02-19 MED ORDER — FENTANYL CITRATE 0.05 MG/ML IJ SOLN
INTRAMUSCULAR | Status: DC | PRN
  Administered 2014-02-19: 150 ug via INTRAVENOUS
  Administered 2014-02-19: 50 ug via INTRAVENOUS
  Administered 2014-02-19 (×4): 25 ug via INTRAVENOUS
  Administered 2014-02-19: 50 ug via INTRAVENOUS

## 2014-02-19 MED ORDER — GLYCOPYRROLATE 0.2 MG/ML IJ SOLN
INTRAMUSCULAR | Status: DC | PRN
  Administered 2014-02-19: .6 mg via INTRAVENOUS

## 2014-02-19 MED ORDER — BENAZEPRIL HCL 20 MG PO TABS
20.0000 mg | ORAL_TABLET | Freq: Every day | ORAL | Status: DC
  Administered 2014-02-19 – 2014-02-20 (×2): 20 mg via ORAL
  Filled 2014-02-19 (×2): qty 1

## 2014-02-19 MED ORDER — PROPOFOL 10 MG/ML IV BOLUS
INTRAVENOUS | Status: DC | PRN
  Administered 2014-02-19: 200 mg via INTRAVENOUS
  Administered 2014-02-19: 30 mg via INTRAVENOUS

## 2014-02-19 MED ORDER — FENTANYL CITRATE 0.05 MG/ML IJ SOLN
INTRAMUSCULAR | Status: AC
  Filled 2014-02-19: qty 5

## 2014-02-19 MED ORDER — METOCLOPRAMIDE HCL 5 MG PO TABS
5.0000 mg | ORAL_TABLET | Freq: Three times a day (TID) | ORAL | Status: DC | PRN
  Filled 2014-02-19: qty 2

## 2014-02-19 MED ORDER — DEXAMETHASONE SODIUM PHOSPHATE 4 MG/ML IJ SOLN
INTRAMUSCULAR | Status: AC
  Filled 2014-02-19: qty 1

## 2014-02-19 MED ORDER — ROCURONIUM BROMIDE 100 MG/10ML IV SOLN
INTRAVENOUS | Status: DC | PRN
  Administered 2014-02-19: 50 mg via INTRAVENOUS

## 2014-02-19 MED ORDER — NEOSTIGMINE METHYLSULFATE 10 MG/10ML IV SOLN
INTRAVENOUS | Status: DC | PRN
  Administered 2014-02-19: 4 mg via INTRAVENOUS

## 2014-02-19 MED ORDER — ONDANSETRON HCL 4 MG PO TABS: 4.0000 mg | ORAL_TABLET | Freq: Four times a day (QID) | ORAL | Status: DC | PRN

## 2014-02-19 MED ORDER — ACETAMINOPHEN 325 MG PO TABS
650.0000 mg | ORAL_TABLET | Freq: Four times a day (QID) | ORAL | Status: DC | PRN
  Administered 2014-02-19 – 2014-02-20 (×4): 650 mg via ORAL
  Filled 2014-02-19 (×4): qty 2

## 2014-02-19 MED ORDER — CEFAZOLIN SODIUM 10 G IJ SOLR
3.0000 g | INTRAMUSCULAR | Status: DC
  Filled 2014-02-19: qty 3000

## 2014-02-19 MED ORDER — PROMETHAZINE HCL 25 MG/ML IJ SOLN: 6.2500 mg | INTRAMUSCULAR | Status: DC | PRN

## 2014-02-19 MED ORDER — GLYCOPYRROLATE 0.2 MG/ML IJ SOLN
INTRAMUSCULAR | Status: AC
  Filled 2014-02-19: qty 4

## 2014-02-19 MED ORDER — DEXTROSE 5 % IV SOLN
3.0000 g | INTRAVENOUS | Status: AC
  Administered 2014-02-19: 3 g via INTRAVENOUS
  Filled 2014-02-19: qty 3000

## 2014-02-19 MED ORDER — MIDAZOLAM HCL 5 MG/5ML IJ SOLN
INTRAMUSCULAR | Status: DC | PRN
  Administered 2014-02-19: 2 mg via INTRAVENOUS

## 2014-02-19 MED ORDER — LACTATED RINGERS IV SOLN
INTRAVENOUS | Status: DC | PRN
  Administered 2014-02-19 (×2): via INTRAVENOUS

## 2014-02-19 MED ORDER — PHENOL 1.4 % MT LIQD: 1.0000 | OROMUCOSAL | Status: DC | PRN

## 2014-02-19 MED ORDER — DEXAMETHASONE SODIUM PHOSPHATE 10 MG/ML IJ SOLN
INTRAMUSCULAR | Status: AC
  Filled 2014-02-19: qty 2

## 2014-02-19 MED ORDER — ASPIRIN EC 325 MG PO TBEC
325.0000 mg | DELAYED_RELEASE_TABLET | Freq: Two times a day (BID) | ORAL | Status: DC
  Administered 2014-02-19 – 2014-02-20 (×2): 325 mg via ORAL
  Filled 2014-02-19 (×3): qty 1

## 2014-02-19 MED ORDER — PHENYLEPHRINE HCL 10 MG/ML IJ SOLN
INTRAMUSCULAR | Status: DC | PRN
  Administered 2014-02-19: 80 ug via INTRAVENOUS
  Administered 2014-02-19: 40 ug via INTRAVENOUS
  Administered 2014-02-19 (×2): 80 ug via INTRAVENOUS

## 2014-02-19 MED ORDER — 0.9 % SODIUM CHLORIDE (POUR BTL) OPTIME
TOPICAL | Status: DC | PRN
  Administered 2014-02-19: 1000 mL

## 2014-02-19 MED ORDER — ACETAMINOPHEN 650 MG RE SUPP: 650.0000 mg | Freq: Four times a day (QID) | RECTAL | Status: DC | PRN

## 2014-02-19 MED ORDER — OXYCODONE HCL 5 MG PO TABS
5.0000 mg | ORAL_TABLET | ORAL | Status: DC | PRN
  Administered 2014-02-19: 10 mg via ORAL
  Administered 2014-02-19: 5 mg via ORAL
  Administered 2014-02-20 (×3): 10 mg via ORAL
  Filled 2014-02-19 (×5): qty 2

## 2014-02-19 MED ORDER — HYDROCODONE-ACETAMINOPHEN 5-325 MG PO TABS: 1.0000 | ORAL_TABLET | Freq: Four times a day (QID) | ORAL | Status: DC | PRN

## 2014-02-19 MED ORDER — MEPERIDINE HCL 25 MG/ML IJ SOLN: 6.2500 mg | INTRAMUSCULAR | Status: DC | PRN

## 2014-02-19 MED ORDER — DIPHENHYDRAMINE HCL 50 MG/ML IJ SOLN
10.0000 mg | Freq: Once | INTRAMUSCULAR | Status: AC
  Administered 2014-02-19: 12.5 mg via INTRAVENOUS

## 2014-02-19 MED ORDER — AMLODIPINE BESYLATE 5 MG PO TABS
5.0000 mg | ORAL_TABLET | Freq: Every day | ORAL | Status: DC
  Administered 2014-02-19 – 2014-02-20 (×2): 5 mg via ORAL
  Filled 2014-02-19 (×2): qty 1

## 2014-02-19 SURGICAL SUPPLY — 42 items
BIT DRILL 4.3MMS DISTAL GRDTED (BIT) ×1 IMPLANT
BNDG COHESIVE 4X5 TAN STRL (GAUZE/BANDAGES/DRESSINGS) ×3 IMPLANT
BNDG GAUZE ELAST 4 BULKY (GAUZE/BANDAGES/DRESSINGS) ×3 IMPLANT
CHLORAPREP W/TINT 26ML (MISCELLANEOUS) ×3 IMPLANT
CORTICAL BONE SCR 5.0MM X 46MM (Screw) ×3 IMPLANT
COVER PERINEAL POST (MISCELLANEOUS) ×3 IMPLANT
COVER SURGICAL LIGHT HANDLE (MISCELLANEOUS) ×3 IMPLANT
DRAPE INCISE IOBAN 66X45 STRL (DRAPES) ×3 IMPLANT
DRAPE STERI IOBAN 125X83 (DRAPES) ×3 IMPLANT
DRAPE SURG 17X23 STRL (DRAPES) ×12 IMPLANT
DRILL 4.3MMS DISTAL GRADUATED (BIT) ×3
DRSG MEPILEX BORDER 4X4 (GAUZE/BANDAGES/DRESSINGS) ×3 IMPLANT
DRSG PAD ABDOMINAL 8X10 ST (GAUZE/BANDAGES/DRESSINGS) ×3 IMPLANT
ELECT REM PT RETURN 9FT ADLT (ELECTROSURGICAL) ×3
ELECTRODE REM PT RTRN 9FT ADLT (ELECTROSURGICAL) ×1 IMPLANT
GLOVE BIO SURGEON STRL SZ7 (GLOVE) ×3 IMPLANT
GLOVE BIO SURGEON STRL SZ7.5 (GLOVE) ×3 IMPLANT
GLOVE BIOGEL PI IND STRL 8 (GLOVE) ×1 IMPLANT
GLOVE BIOGEL PI INDICATOR 8 (GLOVE) ×2
GOWN STRL REUS W/ TWL LRG LVL3 (GOWN DISPOSABLE) ×2 IMPLANT
GOWN STRL REUS W/TWL LRG LVL3 (GOWN DISPOSABLE) ×4
GUIDEPIN 3.2X17.5 THRD DISP (PIN) ×6 IMPLANT
GUIDEWIRE BALL NOSE 100CM (WIRE) ×3 IMPLANT
HFN RH 130 DEG 11MM X 420MM (Nail) ×3 IMPLANT
HIP FR NAIL LAG SCREW 10.5X110 (Orthopedic Implant) ×3 IMPLANT
KIT ROOM TURNOVER OR (KITS) ×3 IMPLANT
LINER BOOT UNIVERSAL DISP (MISCELLANEOUS) ×3 IMPLANT
MANIFOLD NEPTUNE II (INSTRUMENTS) ×3 IMPLANT
NS IRRIG 1000ML POUR BTL (IV SOLUTION) ×3 IMPLANT
PACK GENERAL/GYN (CUSTOM PROCEDURE TRAY) ×3 IMPLANT
PAD ARMBOARD 7.5X6 YLW CONV (MISCELLANEOUS) ×6 IMPLANT
SCREW CORTICL BON 5.0MM X 46MM (Screw) ×1 IMPLANT
SCREW LAG HIP FR NAIL 10.5X110 (Orthopedic Implant) ×1 IMPLANT
SCREWDRIVER HEX TIP 3.5MM (MISCELLANEOUS) ×3 IMPLANT
STAPLER VISISTAT 35W (STAPLE) ×3 IMPLANT
SUT VIC AB 1 CT1 27 (SUTURE) ×2
SUT VIC AB 1 CT1 27XBRD ANBCTR (SUTURE) ×1 IMPLANT
SUT VIC AB 2-0 CT1 27 (SUTURE) ×2
SUT VIC AB 2-0 CT1 TAPERPNT 27 (SUTURE) ×1 IMPLANT
TOWEL OR 17X24 6PK STRL BLUE (TOWEL DISPOSABLE) ×3 IMPLANT
TOWEL OR 17X26 10 PK STRL BLUE (TOWEL DISPOSABLE) ×3 IMPLANT
WATER STERILE IRR 1000ML POUR (IV SOLUTION) ×3 IMPLANT

## 2014-02-19 NOTE — Transfer of Care (Addendum)
Immediate Anesthesia Transfer of Care Note  Patient: Kerry Adams  Procedure(s) Performed: Procedure(s): INTRAMEDULLARY (IM) NAIL INTERTROCHANTRIC (Right)  Patient Location: PACU  Anesthesia Type:General  Level of Consciousness: awake, alert  and oriented  Airway & Oxygen Therapy: Patient Spontanous Breathing and Patient connected to nasal cannula oxygen  Post-op Assessment: Report given to PACU RN and Post -op Vital signs reviewed and stable  Post vital signs: Reviewed and stable  Complications: No apparent anesthesia complications    Pt states that he is comfortable. Denies pain.

## 2014-02-19 NOTE — Progress Notes (Signed)
Orthopedic Tech Progress Note Patient Details:  Kerry Adams [birth year] [id number]  Ortho Devices Ortho Device/Splint Location: trapeze bar patient helper Ortho Device/Splint Interventions: Application   [name] 02/19/2014, 3:12 PM

## 2014-02-19 NOTE — Anesthesia Preprocedure Evaluation (Addendum)
Anesthesia Evaluation  Patient identified by MRN, date of birth, ID band Patient awake    Reviewed: Allergy & Precautions, H&P , NPO status , Patient's Chart, lab work & pertinent test results  History of Anesthesia Complications Negative for: history of anesthetic complications  Airway Mallampati: II  TM Distance: >3 FB Neck ROM: Full    Dental  (+) Dental Advisory Given, Teeth Intact   Pulmonary neg pulmonary ROS,  breath sounds clear to auscultation        Cardiovascular hypertension, Pt. on medications - angina+ CAD (non-obstructive on cardiac CT) Rhythm:Regular Rate:Normal  '14 Myoview: normal   Neuro/Psych negative neurological ROS     GI/Hepatic negative GI ROS, H/o elevated LFTs with alcohol use   Endo/Other  negative endocrine ROS  Renal/GU negative Renal ROS     Musculoskeletal   Abdominal   Peds  Hematology negative hematology ROS (+)   Anesthesia Other Findings   Reproductive/Obstetrics                           Anesthesia Physical Anesthesia Plan  ASA: II  Anesthesia Plan: General   Post-op Pain Management:    Induction: Intravenous  Airway Management Planned: Oral ETT  Additional Equipment:   Intra-op Plan:   Post-operative Plan: Extubation in OR  Informed Consent: I have reviewed the patients History and Physical, chart, labs and discussed the procedure including the risks, benefits and alternatives for the proposed anesthesia with the patient or authorized representative who has indicated his/her understanding and acceptance.   Dental advisory given  Plan Discussed with: CRNA and Surgeon  Anesthesia Plan Comments: (Plan routine monitors, GETA)        Anesthesia Quick Evaluation

## 2014-02-19 NOTE — Anesthesia Procedure Notes (Signed)
Procedure Name: Intubation Date/Time: 02/19/2014 9:42 AM Performed by: Garner Nash Pre-anesthesia Checklist: Patient identified, Emergency Drugs available, Suction available, Patient being monitored and Timeout performed Patient Re-evaluated:Patient Re-evaluated prior to inductionOxygen Delivery Method: Circle system utilized Preoxygenation: Pre-oxygenation with 100% oxygen Intubation Type: IV induction Ventilation: Mask ventilation without difficulty Laryngoscope Size: Mac and 4 Grade View: Grade I Tube size: 7.5 mm Number of attempts: 1 Airway Equipment and Method: Stylet Placement Confirmation: ETT inserted through vocal cords under direct vision,  CO2 detector,  breath sounds checked- equal and bilateral and positive ETCO2 Secured at: 23 cm Tube secured with: Tape Dental Injury: Teeth and Oropharynx as per pre-operative assessment

## 2014-02-19 NOTE — Discharge Instructions (Signed)
Discharge Instructions after Hip Surgery ° ° °You can bear weight and ambulate as tolerated on both legs °Use ice on the hip intermittently over the first 48 hours after surgery.  °Pain medicine has been prescribed for you.  °Use your medicine liberally over the first 48 hours, and then you can begin to taper your use. You may take Extra Strength Tylenol or Tylenol only in place of the pain pills. DO NOT take ANY nonsteroidal anti-inflammatory pain medications: Advil, Motrin, Ibuprofen, Aleve, Naproxen or Naprosyn.  °Take aspirin or anticoagulant medication as prescribed for 2 weeks after surgery. Please notify if allergic or sensitivity to aspirin. °You may remove your dressing after two days.  °You may shower 5 days after surgery. The incisions CANNOT get wet prior to 5 days. Simply allow the water to wash over the site and then pat dry. Do not rub the incisions. ° ° ° °Please call [phone] during normal business hours or [phone] after hours for any problems. Including the following: ° °- excessive redness of the incisions °- drainage for more than 4 days °- fever of more than 101.5 F ° °*Please note that pain medications will not be refilled after hours or on weekends. ° °

## 2014-02-19 NOTE — Progress Notes (Signed)
PROGRESS NOTE  Kerry Adams CXK:[id number] DOB: [birth year] DOA: 02/18/2014 PCP: Vicenta Aly, FNP  HPI/Recap of past 24 hours: Patient is a 58 year old male past medical history of liver disease from alcohol use plus hypertension who presented to the emergency room after a fall 2 days prior and found to have a right-sided hip fracture. Following admission, patient was taken to the operating room on the early morning of 12/27.  Seen after surgery, patient doing okay. Has not had bowel movement for 2 days, otherwise no complaints  Assessment/Plan: Principal Problem:   Closed right hip fracture: Monitor closely for blood loss expected from surgery. Patient's wife is adamant that he did not go to skilled nursing. Will see with physical therapy if he is able to go home with home health or even possibly be candidate for inpatient rehabilitation Active Problems:   Hypertension: Stable   Chronic liver disease   Alcohol use: Has not had a drink in several days, monitor closely for I'll call withdrawal   Hip fracture requiring operative repair Constipation: We'll try some MiraLAX  Code Status: Full code  Family Communication: Wife at the bedside  Disposition Plan: Skilled nursing in a few days   Consultants:  Orthopedic surgery  Procedures:  Status post right IM nail  Antibiotics:  Ancef times one for surgery   Objective: BP 153/92 mmHg  Pulse 90  Temp(Src) 99.3 F (37.4 C) (Oral)  Resp 18  Wt 96.389 kg (212 lb 8 oz)  SpO2 97%  Intake/Output Summary (Last 24 hours) at 02/19/14 1424 Last data filed at 02/19/14 1300  Gross per 24 hour  Intake   1740 ml  Output   1000 ml  Net    740 ml   Filed Weights   02/18/14 2211  Weight: 96.389 kg (212 lb 8 oz)    Exam:   General:  Alert and oriented 3, no acute distress  Cardiovascular: Regular rate and rhythm, S1-S2  Respiratory: Clear to auscultation bilaterally  Abdomen: Soft, obese, nontender, distended, few  bowel sounds  Musculoskeletal: No clubbing or cyanosis, trace edema   Data Reviewed: Basic Metabolic Panel:  Recent Labs Lab 02/18/14 2021 02/18/14 2030 02/18/14 2110 02/19/14 0530  NA 139 137 139 138  K 4.3 4.2 4.2 4.0  CL 106 106 108 107  CO2  --  22 23 23   GLUCOSE 116* 110* 111* 93  BUN 15 10 11 11   CREATININE 0.60 0.66 0.68 0.63  CALCIUM  --  9.2 9.4 9.0   Liver Function Tests:  Recent Labs Lab 02/18/14 2110  AST 35  ALT 29  ALKPHOS 103  BILITOT 1.1  PROT 6.2  ALBUMIN 3.5   No results for input(s): LIPASE, AMYLASE in the last 168 hours. No results for input(s): AMMONIA in the last 168 hours. CBC:  Recent Labs Lab 02/18/14 2021 02/18/14 2030 02/19/14 0530  WBC  --  6.5 7.2  NEUTROABS  --  4.5  --   HGB 13.9 13.0 12.5*  HCT 41.0 38.1* 37.3*  MCV  --  91.1 94.7  PLT  --  104* 104*   Cardiac Enzymes:   No results for input(s): CKTOTAL, CKMB, CKMBINDEX, TROPONINI in the last 168 hours. BNP (last 3 results) No results for input(s): PROBNP in the last 8760 hours. CBG: No results for input(s): GLUCAP in the last 168 hours.  Recent Results (from the past 240 hour(s))  MRSA PCR Screening     Status: None   Collection Time: 02/18/14 10:56  PM  Result Value Ref Range Status   MRSA by PCR NEGATIVE NEGATIVE Final    Comment:        The GeneXpert MRSA Assay (FDA approved for NASAL specimens only), is one component of a comprehensive MRSA colonization surveillance program. It is not intended to diagnose MRSA infection nor to guide or monitor treatment for MRSA infections.      Studies: Dg Hip Complete Right  02/18/2014   CLINICAL DATA:  Recent fall with right hip pain, additional encounter  EXAM: RIGHT HIP - COMPLETE 2+ VIEW  COMPARISON:  None.  FINDINGS: There is an intratrochanteric fracture of the proximal right femur identified with impaction and angulation at the fracture site. No other acute abnormality is noted. Pubic symphysis degenerative  changes are seen.  IMPRESSION: Right intratrochanteric fracture   Electronically Signed   By: Inez Catalina M.D.   On: 02/18/2014 19:45    Scheduled Meds: . amLODipine-benazepril  1 capsule Oral Daily  . aspirin EC  325 mg Oral BID  . atorvastatin  10 mg Oral Daily  .  ceFAZolin (ANCEF) IV  2 g Intravenous Q6H    Continuous Infusions: . sodium chloride 75 mL/hr (02/18/14 2230)     Time spent: 15 minutes  [facility] Pager [phone]. If 7PM-7AM, please contact night-coverage at [email or web address], password [facility] 02/19/2014, 2:24 PM  LOS: 1 day

## 2014-02-19 NOTE — Consult Note (Signed)
Reason for Consult: Right hip fracture Referring Physician: Dr. Rufina Falco Kerry Adams is an 58 y.o. male.  HPI: 58 year old male well-known to me, who had a fall onto his right hip 3 days ago. He initially had soreness, but yesterday was trying to move a large rock and felt a pop in his hip. He immediately had severe pain. He was seen in the emergency department where he was found to have a right intertrochanteric proximal femur fracture. I was consulted for evaluation and management. Denies other injuries with the fall.  He recently had a left knee arthroscopy and was recovering.  Past Medical History  Diagnosis Date  . Hypertension   . Elevated liver function tests   . Chronic liver disease   . Abnormal EKG   . Dizziness     History reviewed. No pertinent past surgical history.  History reviewed. No pertinent family history.  Social History:  reports that he has never smoked. He does not have any smokeless tobacco history on file. His alcohol and drug histories are not on file.  Allergies: No Known Allergies  Medications: I have reviewed the patient's current medications.  Results for orders placed or performed during the hospital encounter of 02/18/14 (from the past 48 hour(s))  Type and screen     Status: None   Collection Time: 02/18/14  8:10 PM  Result Value Ref Range   ABO/RH(D) B POS    Antibody Screen NEG    Sample Expiration 02/21/2014   ABO/Rh     Status: None   Collection Time: 02/18/14  8:10 PM  Result Value Ref Range   ABO/RH(D) B POS   I-stat chem 8, ed     Status: Abnormal   Collection Time: 02/18/14  8:21 PM  Result Value Ref Range   Sodium 139 135 - 145 mmol/L   Potassium 4.3 3.5 - 5.1 mmol/L   Chloride 106 96 - 112 mEq/L   BUN 15 6 - 23 mg/dL   Creatinine, Ser 0.60 0.50 - 1.35 mg/dL   Glucose, Bld 116 (H) 70 - 99 mg/dL   Calcium, Ion 1.27 (H) 1.12 - 1.23 mmol/L   TCO2 20 0 - 100 mmol/L   Hemoglobin 13.9 13.0 - 17.0 g/dL   HCT 41.0 39.0 - 52.0 %   Basic metabolic panel     Status: Abnormal   Collection Time: 02/18/14  8:30 PM  Result Value Ref Range   Sodium 137 135 - 145 mmol/L    Comment: Please note change in reference range.   Potassium 4.2 3.5 - 5.1 mmol/L    Comment: Please note change in reference range.   Chloride 106 96 - 112 mEq/L   CO2 22 19 - 32 mmol/L   Glucose, Bld 110 (H) 70 - 99 mg/dL   BUN 10 6 - 23 mg/dL   Creatinine, Ser 0.66 0.50 - 1.35 mg/dL   Calcium 9.2 8.4 - 10.5 mg/dL   GFR calc non Af Amer >90 >90 mL/min   GFR calc Af Amer >90 >90 mL/min    Comment: (NOTE) The eGFR has been calculated using the CKD EPI equation. This calculation has not been validated in all clinical situations. eGFR's persistently <90 mL/min signify possible Chronic Kidney Disease.    Anion gap 9 5 - 15  CBC WITH DIFFERENTIAL     Status: Abnormal   Collection Time: 02/18/14  8:30 PM  Result Value Ref Range   WBC 6.5 4.0 - 10.5 K/uL  RBC 4.18 (L) 4.22 - 5.81 MIL/uL   Hemoglobin 13.0 13.0 - 17.0 g/dL   HCT 38.1 (L) 39.0 - 52.0 %   MCV 91.1 78.0 - 100.0 fL   MCH 31.1 26.0 - 34.0 pg   MCHC 34.1 30.0 - 36.0 g/dL   RDW 13.9 11.5 - 15.5 %   Platelets 104 (L) 150 - 400 K/uL    Comment: REPEATED TO VERIFY PLATELET COUNT CONFIRMED BY SMEAR    Neutrophils Relative % 70 43 - 77 %   Neutro Abs 4.5 1.7 - 7.7 K/uL   Lymphocytes Relative 18 12 - 46 %   Lymphs Abs 1.2 0.7 - 4.0 K/uL   Monocytes Relative 10 3 - 12 %   Monocytes Absolute 0.7 0.1 - 1.0 K/uL   Eosinophils Relative 2 0 - 5 %   Eosinophils Absolute 0.2 0.0 - 0.7 K/uL   Basophils Relative 0 0 - 1 %   Basophils Absolute 0.0 0.0 - 0.1 K/uL  Protime-INR     Status: Abnormal   Collection Time: 02/18/14  8:30 PM  Result Value Ref Range   Prothrombin Time 16.5 (H) 11.6 - 15.2 seconds   INR 1.32 0.00 - 1.49  Urinalysis, Routine w reflex microscopic     Status: Abnormal   Collection Time: 02/18/14  8:43 PM  Result Value Ref Range   Color, Urine YELLOW YELLOW   APPearance  CLEAR CLEAR   Specific Gravity, Urine 1.016 1.005 - 1.030   pH 6.0 5.0 - 8.0   Glucose, UA NEGATIVE NEGATIVE mg/dL   Hgb urine dipstick NEGATIVE NEGATIVE   Bilirubin Urine NEGATIVE NEGATIVE   Ketones, ur 15 (A) NEGATIVE mg/dL   Protein, ur NEGATIVE NEGATIVE mg/dL   Urobilinogen, UA 0.2 0.0 - 1.0 mg/dL   Nitrite NEGATIVE NEGATIVE   Leukocytes, UA NEGATIVE NEGATIVE    Comment: MICROSCOPIC NOT DONE ON URINES WITH NEGATIVE PROTEIN, BLOOD, LEUKOCYTES, NITRITE, OR GLUCOSE <1000 mg/dL.  Comprehensive metabolic panel     Status: Abnormal   Collection Time: 02/18/14  9:10 PM  Result Value Ref Range   Sodium 139 135 - 145 mmol/L    Comment: Please note change in reference range.   Potassium 4.2 3.5 - 5.1 mmol/L    Comment: Please note change in reference range.   Chloride 108 96 - 112 mEq/L   CO2 23 19 - 32 mmol/L   Glucose, Bld 111 (H) 70 - 99 mg/dL   BUN 11 6 - 23 mg/dL   Creatinine, Ser 0.68 0.50 - 1.35 mg/dL   Calcium 9.4 8.4 - 10.5 mg/dL   Total Protein 6.2 6.0 - 8.3 g/dL   Albumin 3.5 3.5 - 5.2 g/dL   AST 35 0 - 37 U/L   ALT 29 0 - 53 U/L   Alkaline Phosphatase 103 39 - 117 U/L   Total Bilirubin 1.1 0.3 - 1.2 mg/dL   GFR calc non Af Amer >90 >90 mL/min   GFR calc Af Amer >90 >90 mL/min    Comment: (NOTE) The eGFR has been calculated using the CKD EPI equation. This calculation has not been validated in all clinical situations. eGFR's persistently <90 mL/min signify possible Chronic Kidney Disease.    Anion gap 8 5 - 15  MRSA PCR Screening     Status: None   Collection Time: 02/18/14 10:56 PM  Result Value Ref Range   MRSA by PCR NEGATIVE NEGATIVE    Comment:        The GeneXpert MRSA  Assay (FDA approved for NASAL specimens only), is one component of a comprehensive MRSA colonization surveillance program. It is not intended to diagnose MRSA infection nor to guide or monitor treatment for MRSA infections.   Basic metabolic panel     Status: None   Collection Time:  02/19/14  5:30 AM  Result Value Ref Range   Sodium 138 135 - 145 mmol/L    Comment: Please note change in reference range.   Potassium 4.0 3.5 - 5.1 mmol/L    Comment: Please note change in reference range.   Chloride 107 96 - 112 mEq/L   CO2 23 19 - 32 mmol/L   Glucose, Bld 93 70 - 99 mg/dL   BUN 11 6 - 23 mg/dL   Creatinine, Ser 0.63 0.50 - 1.35 mg/dL   Calcium 9.0 8.4 - 10.5 mg/dL   GFR calc non Af Amer >90 >90 mL/min   GFR calc Af Amer >90 >90 mL/min    Comment: (NOTE) The eGFR has been calculated using the CKD EPI equation. This calculation has not been validated in all clinical situations. eGFR's persistently <90 mL/min signify possible Chronic Kidney Disease.    Anion gap 8 5 - 15  CBC     Status: Abnormal   Collection Time: 02/19/14  5:30 AM  Result Value Ref Range   WBC 7.2 4.0 - 10.5 K/uL   RBC 3.94 (L) 4.22 - 5.81 MIL/uL   Hemoglobin 12.5 (L) 13.0 - 17.0 g/dL   HCT 37.3 (L) 39.0 - 52.0 %   MCV 94.7 78.0 - 100.0 fL   MCH 31.7 26.0 - 34.0 pg   MCHC 33.5 30.0 - 36.0 g/dL   RDW 14.2 11.5 - 15.5 %   Platelets 104 (L) 150 - 400 K/uL    Comment: CONSISTENT WITH PREVIOUS RESULT    Dg Hip Complete Right  02/18/2014   CLINICAL DATA:  Recent fall with right hip pain, additional encounter  EXAM: RIGHT HIP - COMPLETE 2+ VIEW  COMPARISON:  None.  FINDINGS: There is an intratrochanteric fracture of the proximal right femur identified with impaction and angulation at the fracture site. No other acute abnormality is noted. Pubic symphysis degenerative changes are seen.  IMPRESSION: Right intratrochanteric fracture   Electronically Signed   By: Inez Catalina M.D.   On: 02/18/2014 19:45    Review of Systems  All other systems reviewed and are negative.  Blood pressure 141/93, pulse 71, temperature 98.4 F (36.9 C), temperature source Oral, resp. rate 18, weight 96.389 kg (212 lb 8 oz), SpO2 99 %. Physical Exam  Constitutional: He is oriented to person, place, and time. He  appears well-developed and well-nourished.  HENT:  Head: Atraumatic.  Eyes: EOM are normal.  Cardiovascular: Intact distal pulses.   Respiratory: Effort normal.  Musculoskeletal:  RLE shortened, ER, NVID No other musculoskeletal TTP or pain with ROM.  Neurological: He is alert and oriented to person, place, and time.  Skin: Skin is warm and dry.  Psychiatric: He has a normal mood and affect.    Assessment/Plan: Right hip intertrochanteric proximal femur fracture Plan: Right intramedullary nail Risks / benefits of surgery discussed Consent on chart  NPO for OR Preop antibiotics   Kerry Adams 02/19/2014, 8:00 AM

## 2014-02-19 NOTE — Op Note (Signed)
Procedure(s): INTRAMEDULLARY (IM) NAIL INTERTROCHANTRIC Procedure Note  LILY KERNEN male 58 y.o. 02/19/2014  Procedure(s) and Anesthesia Type:    * RIGHT INTRAMEDULLARY (IM) NAIL INTERTROCHANTRIC - General  Surgeon(s) and Role:    * [name], MD - Primary   Indications:  58 y.o. male s/p fall with right hip fracture. Indicated for surgery to promote early ambulation, pain control and prevent complications of bed rest.     Surgeon: [name]   Assistants: Jeanmarie Hubert PA-C (Danielle was present and scrubbed throughout the procedure and was essential in positioning, retraction, exposure, and closure)  Anesthesia: General endotracheal anesthesia    Procedure Detail  INTRAMEDULLARY (IM) NAIL INTERTROCHANTRIC  Findings: Biomet cephalo-medullary nail with a 420 mm nail by 11 mm. 110 mm lag screw. Anatomic reduction.  Estimated Blood Loss:  200 mL         Drains: none  Blood Given: none          Specimens: none        Complications:  * No complications entered in OR log *         Disposition: PACU - hemodynamically stable.         Condition: stable    Procedure:  The patient was identified in the preoperative holding area  where I personally marked the operative site after verifying site, side,  and procedure with the patient. She was taken back to the operating  room where general anesthesia was induced without complication. She was  placed on the fracture table with the right lower extremity in traction,  and opposite lower extremity in a flexed abducted position. The arms were well  padded. Fluoroscopic imaging was used to verify reduction with gentle traction  and internal rotation. The right hip was then prepped and draped in the standard sterile fashion. An approximately 3 cm incision was made proximal  to the palpable greater trochanter tip. Dissection was carried down to  the tip and the short guidewire was placed under  fluoroscopic imaging.  The proximal entry reamer was used to open the canal and the ball-tipped  guidewire was then placed and advanced down the femoral canal. This was  used to obtain a measurement for the implant and then was subsequently  over reamed up to 12.5 mm by 0.5 mm increments. The 11 x 420 mm nail was then  advanced over the guidewire without difficulty and the proximal jig was  then placed and a small 1.5 cm incision was made on the lateral thigh to  advance the lag screw guide against the lateral aspect of the femur.  The guide pin was advanced and its position was verified in AP and  lateral planes to be centered in the head.  The guidewire was over  reamed and the appropriate size lag screw was advanced. The  proximal set screw was then advanced, backed off a quarter turn to allow  sliding. AP and lateral imaging demonstrated appropriate position of  the screw and reduction of the fracture. The proximal jig was then  removed. Attention was turned to the knee. Where using perfect  circles technique on the lateral x-ray, one distal interlocking screw  was placed from lateral to medial through a percutaneous incision.  Final fluoroscopic imaging in AP and lateral planes at the hip and the  knee demonstrated near anatomic reduction with appropriate length and  position of the hardware. All wounds were then copiously irrigated with  normal saline and subsequently closed in  layers with #1 Vicryl in a deep  fascia layer, 2-0 Vicryl in a deep dermal layer, and staples for skin  closure. Sterile dressings were then applied including 4x4s and Mepilex  dressings. The patient was then taken off the fracture table,  transferred to the stretcher, and taken to the recovery room in stable  condition after she was extubated.   POSTOPERATIVE PLAN: He will be weightbearing as tolerated on the operative extremity. She will have DVT prophylaxis of aspirin twice a day and SCDs.

## 2014-02-19 NOTE — Anesthesia Postprocedure Evaluation (Signed)
  Anesthesia Post-op Note  Patient: Kerry Adams  Procedure(s) Performed: Procedure(s): INTRAMEDULLARY (IM) NAIL INTERTROCHANTRIC (Right)  Patient Location: PACU  Anesthesia Type:General  Level of Consciousness: awake, alert , oriented and patient cooperative  Airway and Oxygen Therapy: Patient Spontanous Breathing and Patient connected to nasal cannula oxygen  Post-op Pain: none  Post-op Assessment: Post-op Vital signs reviewed, Patient's Cardiovascular Status Stable, Respiratory Function Stable, Patent Airway, No signs of Nausea or vomiting and Pain level controlled  Post-op Vital Signs: Reviewed and stable  Last Vitals:  Filed Vitals:   02/19/14 1113  BP: 140/78  Pulse: 79  Temp:   Resp: 21    Complications: No apparent anesthesia complications

## 2014-02-20 ENCOUNTER — Encounter (HOSPITAL_COMMUNITY): Payer: Self-pay | Admitting: General Practice

## 2014-02-20 LAB — CBC
HCT: 35.2 % — ABNORMAL LOW (ref 39.0–52.0)
Hemoglobin: 11.7 g/dL — ABNORMAL LOW (ref 13.0–17.0)
MCH: 31.3 pg (ref 26.0–34.0)
MCHC: 33.2 g/dL (ref 30.0–36.0)
MCV: 94.1 fL (ref 78.0–100.0)
Platelets: 116 10*3/uL — ABNORMAL LOW (ref 150–400)
RBC: 3.74 MIL/uL — ABNORMAL LOW (ref 4.22–5.81)
RDW: 14.1 % (ref 11.5–15.5)
WBC: 9.5 10*3/uL (ref 4.0–10.5)

## 2014-02-20 MED ORDER — INFLUENZA VAC SPLIT QUAD 0.5 ML IM SUSY: 0.5000 mL | PREFILLED_SYRINGE | INTRAMUSCULAR | Status: DC

## 2014-02-20 NOTE — Progress Notes (Signed)
CARE MANAGEMENT NOTE 02/20/2014  Patient:  Kerry Adams, Kerry Adams   Account Number:  [id number]  Date Initiated:  02/20/2014  Documentation initiated by:  [facility]  Subjective/Objective Assessment:   rt intertrochanteric hip fx, s/p IM nail     Action/Plan:   PT  eval-no home care or equipment recommended   Anticipated DC Date:  02/20/2014   Anticipated DC Plan:  [place]  CM consult      Choice offered to / List presented to:             Status of service:  Completed, signed off Medicare Important Message given?   (If response is "NO", the following Medicare IM given date fields will be blank) Date Medicare IM given:   Medicare IM given by:   Date Additional Medicare IM given:   Additional Medicare IM given by:    Discharge Disposition:  HOME/SELF CARE  Per UR Regulation:  Reviewed for med. necessity/level of care/duration of stay

## 2014-02-20 NOTE — Progress Notes (Signed)
PATIENT ID: Kerry Adams  MRN: [id number]  DOB/AGE:  Dec 18, 1955 / 58 y.o.  1 Day Post-Op Procedure(s) (LRB): INTRAMEDULLARY (IM) NAIL INTERTROCHANTRIC (Right)    PROGRESS NOTE Subjective: Patient is alert, oriented, no Nausea, no Vomiting, yes passing gas, no Bowel Movement. Taking PO well. Denies SOB, Chest or Calf Pain. Using Incentive Spirometer, PAS in place. Ambulate 50% to right lower extremity Patient reports pain as 6 on 0-10 scale  .    Objective: Vital signs in last 24 hours: Filed Vitals:   02/19/14 2034 02/20/14 0145 02/20/14 0512 02/20/14 0800  BP: 114/73 106/68 115/82   Pulse: 85 67 65   Temp: 99.5 F (37.5 C) 98.4 F (36.9 C) 97.8 F (36.6 C)   TempSrc: Oral Oral Oral   Resp: 19 16 17 18   Weight:      SpO2: 98% 95% 98% 98%      Intake/Output from previous day: I/O last 3 completed shifts: In: 2040 [P.O.:540; I.V.:1500] Out: 2065 [Urine:1990; Blood:75]   Intake/Output this shift: Total I/O In: 360 [P.O.:360] Out: -    LABORATORY DATA:  Recent Labs  02/18/14 2030 02/18/14 2110 02/19/14 0530  WBC 6.5  --  7.2  HGB 13.0  --  12.5*  HCT 38.1*  --  37.3*  PLT 104*  --  104*  NA 137 139 138  K 4.2 4.2 4.0  CL 106 108 107  CO2 22 23 23   BUN 10 11 11   CREATININE 0.66 0.68 0.63  GLUCOSE 110* 111* 93  INR 1.32  --   --   CALCIUM 9.2 9.4 9.0    Examination: Neurologically intact Neurovascular intact Sensation intact distally Intact pulses distally Dorsiflexion/Plantar flexion intact Incision: dressing C/D/I No cellulitis present Compartment soft} XR AP&Lat of hip shows well placed\fixed THA  Assessment:   1 Day Post-Op Procedure(s) (LRB): INTRAMEDULLARY (IM) NAIL INTERTROCHANTRIC (Right) ADDITIONAL DIAGNOSIS:  Expected Acute Blood Loss Anemia, Hypertension and chronic liver disease  Plan: PT/OT WBAT, THA  posterior precautions  DVT Prophylaxis: SCDx72 hrs, ASA 325 mg BID x 2 weeks  DISCHARGE PLAN: Home, today after pt meets PT  goals  DISCHARGE NEEDS: HHPT, HHRN, Walker and 3-in-1 comode seat

## 2014-02-20 NOTE — Progress Notes (Signed)
Utilization review completed. Lucca Greggs, RN, BSN. 

## 2014-02-20 NOTE — Discharge Summary (Signed)
Patient ID: Kerry Adams MRN: [id number] DOB/AGE: 10/11/1955 58 y.o.  Admit date: 02/18/2014 Discharge date: 02/20/2014  Admission Diagnoses:  Principal Problem:   Closed right hip fracture Active Problems:   Hypertension   Chronic liver disease   Alcohol use   Hip fracture requiring operative repair   Discharge Diagnoses:  Same  Past Medical History  Diagnosis Date  . Hypertension   . Elevated liver function tests   . Chronic liver disease   . Abnormal EKG   . Dizziness   . Arthritis     osteo knees and hips     Surgeries: Procedure(s): INTRAMEDULLARY (IM) NAIL INTERTROCHANTRIC on 02/18/2014 - 02/19/2014   Consultants: Treatment Team:  [name], MD  Discharged Condition: Improved  Hospital Course: Kerry Adams is an 58 y.o. male who was admitted 02/18/2014 for operative treatment ofClosed right hip fracture. Patient has severe unremitting pain that affects sleep, daily activities, and work/hobbies. After pre-op clearance the patient was taken to the operating room on 02/18/2014 - 02/19/2014 and underwent  Procedure(s): INTRAMEDULLARY (IM) NAIL INTERTROCHANTRIC.    Patient was given perioperative antibiotics: Anti-infectives    Start     Dose/Rate Route Frequency Ordered Stop   02/19/14 1400  ceFAZolin (ANCEF) IVPB 2 g/50 mL premix     2 g100 mL/hr over 30 Minutes Intravenous Every 6 hours 02/19/14 1205 02/19/14 2142   02/19/14 0900  ceFAZolin (ANCEF) 3 g in dextrose 5 % 50 mL IVPB     3 g160 mL/hr over 30 Minutes Intravenous On call to O.R. 02/19/14 6237 02/19/14 0923   02/19/14 0600  ceFAZolin (ANCEF) 3 g in dextrose 5 % 50 mL IVPB  Status:  Discontinued    Comments:  To give intraoperatively   3 g160 mL/hr over 30 Minutes Intravenous On call to O.R. 02/19/14 0126 02/19/14 6283       Patient was given sequential compression devices, early ambulation, and chemoprophylaxis to prevent DVT.  Patient benefited maximally from hospital stay and  there were no complications.    Recent vital signs: Patient Vitals for the past 24 hrs:  BP Temp Temp src Pulse Resp SpO2  02/20/14 0800 - - - - 18 98 %  02/20/14 0512 115/82 mmHg 97.8 F (36.6 C) Oral 65 17 98 %  02/20/14 0145 106/68 mmHg 98.4 F (36.9 C) Oral 67 16 95 %  02/19/14 2034 114/73 mmHg 99.5 F (37.5 C) Oral 85 19 98 %  02/19/14 1600 - - - - 18 98 %  02/19/14 1341 (!) 153/92 mmHg 99.3 F (37.4 C) Oral 90 18 97 %  02/19/14 1153 (!) 149/85 mmHg 98.2 F (36.8 C) - - 18 96 %  02/19/14 1128 (!) 150/87 mmHg 98.3 F (36.8 C) - 82 16 98 %     Recent laboratory studies:  Recent Labs  02/18/14 2030 02/18/14 2110 02/19/14 0530  WBC 6.5  --  7.2  HGB 13.0  --  12.5*  HCT 38.1*  --  37.3*  PLT 104*  --  104*  NA 137 139 138  K 4.2 4.2 4.0  CL 106 108 107  CO2 22 23 23   BUN 10 11 11   CREATININE 0.66 0.68 0.63  GLUCOSE 110* 111* 93  INR 1.32  --   --   CALCIUM 9.2 9.4 9.0     Discharge Medications:     Medication List    STOP taking these medications        aspirin 81  MG tablet  Replaced by:  aspirin EC 325 MG tablet     naproxen sodium 220 MG tablet  Commonly known as:  ANAPROX      TAKE these medications        amLODipine-benazepril 5-20 MG per capsule  Commonly known as:  LOTREL  Take 1 capsule by mouth daily.     aspirin EC 325 MG tablet  Take 1 tablet (325 mg total) by mouth 2 (two) times daily.     atorvastatin 20 MG tablet  Commonly known as:  LIPITOR  Take 0.5 tablets (10 mg total) by mouth daily.     atorvastatin 10 MG tablet  Commonly known as:  LIPITOR  Take 10 mg by mouth daily.     HYDROcodone-acetaminophen 5-325 MG per tablet  Commonly known as:  NORCO  Take 1-2 tablets by mouth every 4 (four) hours as needed for moderate pain or severe pain.     meloxicam 15 MG tablet  Commonly known as:  MOBIC  Take 15 mg by mouth daily.        Diagnostic Studies: Dg Hip Complete Right  02/18/2014   CLINICAL DATA:  Recent fall with  right hip pain, additional encounter  EXAM: RIGHT HIP - COMPLETE 2+ VIEW  COMPARISON:  None.  FINDINGS: There is an intratrochanteric fracture of the proximal right femur identified with impaction and angulation at the fracture site. No other acute abnormality is noted. Pubic symphysis degenerative changes are seen.  IMPRESSION: Right intratrochanteric fracture   Electronically Signed   By: Inez Catalina M.D.   On: 02/18/2014 19:45   Dg Femur Right  02/19/2014   CLINICAL DATA:  Right femur fracture repair  EXAM: RIGHT FEMUR - 2 VIEW; DG C-ARM 61-120 MIN  COMPARISON:  02/18/2014  FINDINGS: 4 intraoperative views of the right femur submitted. The patient is status post repair of intertrochanteric right femoral fracture. A intra medullary rod and metallic fixation pin is noted in right femur. A locking screw is noted in distal femur. There is anatomic alignment.  IMPRESSION: Status post intertrochanteric fracture repair of proximal right femur. There is intra medullary rod. A metallic fixation pin is noted in proximal right femur. A locking screw is noted in distal femur. There is anatomic alignment.  Fluoroscopy time was 47 seconds.   Electronically Signed   By: Lahoma Crocker M.D.   On: 02/19/2014 12:20   Dg Femur Right Port  02/19/2014   CLINICAL DATA:  Hip fracture, ORIF  EXAM: PORTABLE RIGHT FEMUR - 2 VIEW  COMPARISON:  Portable exam 1113 hr compared to intraoperative images of 02/19/2014  FINDINGS: IM nail with compression screw identified in RIGHT femur across a reduced intertrochanteric fracture.  Distal locking screw present.  Osseous demineralization.  No additional fracture or dislocation.  Degenerative changes RIGHT knee.  IMPRESSION: Post ORIF of a reduced RIGHT intertrochanteric fracture.   Electronically Signed   By: Lavonia Dana M.D.   On: 02/19/2014 13:08   Dg C-arm 1-60 Min  02/19/2014   CLINICAL DATA:  Right femur fracture repair  EXAM: RIGHT FEMUR - 2 VIEW; DG C-ARM 61-120 MIN  COMPARISON:   02/18/2014  FINDINGS: 4 intraoperative views of the right femur submitted. The patient is status post repair of intertrochanteric right femoral fracture. A intra medullary rod and metallic fixation pin is noted in right femur. A locking screw is noted in distal femur. There is anatomic alignment.  IMPRESSION: Status post intertrochanteric fracture repair of proximal right femur.  There is intra medullary rod. A metallic fixation pin is noted in proximal right femur. A locking screw is noted in distal femur. There is anatomic alignment.  Fluoroscopy time was 47 seconds.   Electronically Signed   By: Lahoma Crocker M.D.   On: 02/19/2014 12:20    Disposition: Final discharge disposition not confirmed      Discharge Instructions    Call MD / Call 911    Complete by:  As directed   If you experience chest pain or shortness of breath, CALL 911 and be transported to the hospital emergency room.  If you develope a fever above 101 F, pus (white drainage) or increased drainage or redness at the wound, or calf pain, call your surgeon's office.     Constipation Prevention    Complete by:  As directed   Drink plenty of fluids.  Prune juice may be helpful.  You may use a stool softener, such as Colace (over the counter) 100 mg twice a day.  Use MiraLax (over the counter) for constipation as needed.     DO NOT drive, shower or take a tub bath until instructed by your physician    Complete by:  As directed      Diet - low sodium heart healthy    Complete by:  As directed      Discharge instructions    Complete by:  As directed   Follow up in office with Dr. Tamera Punt in 10-14 days.     Discharge wound care:    Complete by:  As directed   If you have a hip bandage, keep it clean and dry.  Change your bandage as instructed by your health care providers.  If your bandage has been discontinued, keep your incision clean and dry.  Pat dry after bathing.  DO NOT put lotion or powder on your incision.     Do not sit on  low chairs, stoools or toilet seats, as it may be difficult to get up from low surfaces    Complete by:  As directed      Driving restrictions    Complete by:  As directed   No driving for 2 weeks     Increase activity slowly as tolerated    Complete by:  As directed      Patient may shower    Complete by:  As directed   You may shower without a dressing once there is no drainage.  Do not wash over the wound.  If drainage remains, cover wound with plastic wrap and then shower.     Weight bearing as tolerated    Complete by:  As directed            Follow-up Information    Follow up with [name], MD. Schedule an appointment as soon as possible for a visit in 1 week.   Specialty:  Orthopedic Surgery   Contact information:   [place] [address] [phone]        Signed: Hardin Negus, Rhylee Pucillo R 02/20/2014, 11:14 AM

## 2014-02-20 NOTE — Evaluation (Signed)
Physical Therapy Evaluation Patient Details Name: Kerry Adams MRN: [id number] DOB: 09-Jul-1955 Today's Date: 02/20/2014   History of Present Illness  58 y.o. male admitted to [facility] on 02/18/14 due to fall with resultant R hip fx requring IM nailing on 02/19/14.  Pt with significant PMHx of recent L knee arthroscopy (per pt ~4 weeks ago), HTN, R shoulder surgery, chronic liver disease, dizziness, and arthritis of knees and hips.    Clinical Impression  Pt is POD #1 s/p hip surgery and is moving well, mostly supervision level for all mobility and stairs.  HEP given and reviewed and pt reports he will be compliant with these.  PT will follow acutely until d/c, however, if pt and MD decide, he is safe to d/c home today.  PT recommending he do his HEP and at 2 week f/u with ortho MD discuss the need or not need for OP PT f/u.      Follow Up Recommendations No PT follow up;Other (comment) (defer OP PT to MD at f/u appointment)    Equipment Recommendations  None recommended by PT    Recommendations for Other Services   NA    Precautions / Restrictions Restrictions Weight Bearing Restrictions: Yes RLE Weight Bearing: Weight bearing as tolerated      Mobility  Bed Mobility Overal bed mobility: Modified Independent             General bed mobility comments: slow, but able to get OOB without assist.   Transfers Overall transfer level: Needs assistance Equipment used: Rolling walker (2 wheeled) Transfers: Sit to/from Stand Sit to Stand: Supervision         General transfer comment: supervision for safety and verbal cues for safe hand placement during transitions.   Ambulation/Gait Ambulation/Gait assistance: Supervision Ambulation Distance (Feet): 200 Feet Assistive device: Rolling walker (2 wheeled) Gait Pattern/deviations: Step-through pattern;Antalgic Gait velocity: decreased Gait velocity interpretation: Below normal speed for age/gender General Gait Details: mildly  antalgic gait pattern, which improved with increased gait distance.  RW adjusted down for his height.   Stairs Stairs: Yes Stairs assistance: Supervision Stair Management: One rail Right;Step to pattern;Forwards;With crutches Number of Stairs: 5 General stair comments: Verbal cues for correct LE sequencing and crutch use.  Pt preferred using the crutch for stability vs both railings.          Balance Overall balance assessment: Needs assistance Sitting-balance support: Feet supported;No upper extremity supported Sitting balance-Leahy Scale: Good     Standing balance support: No upper extremity supported;Bilateral upper extremity supported;Single extremity supported Standing balance-Leahy Scale: Good                               Pertinent Vitals/Pain Pain Assessment: 0-10 Pain Score: 5  Pain Location: right hip fracture, sore Pain Descriptors / Indicators: Burning;Aching;Sore Pain Intervention(s): Limited activity within patient's tolerance;Monitored during session;Repositioned    Home Living Family/patient expects to be discharged to:: Private residence Living Arrangements: Spouse/significant other Available Help at Discharge: Family;Available 24 hours/day Type of Home: House Home Access: Stairs to enter Entrance Stairs-Rails: Right;Left;Can reach both Entrance Stairs-Number of Steps: 2 Home Layout: Two level;Able to live on main level with bedroom/bathroom Home Equipment: Gilford Rile - 2 wheels;Crutches;Shower seat (walk in shower)      Prior Function Level of Independence: Independent         Comments: still works, Aeronautical engineer, part time- standing, minimal lifting.      Hand Dominance  Dominant Hand: Right    Extremity/Trunk Assessment   Upper Extremity Assessment: Defer to OT evaluation           Lower Extremity Assessment: RLE deficits/detail;LLE deficits/detail RLE Deficits / Details: right leg with normal post op pain and weakness.   Ankle 4/5, knee 3+/5, and hip 2+/5. LLE Deficits / Details: Left leg with recent L knee arthroscopy (per pt ~4 weeks ago).  decreased knee flexion ROM to ~110 degrees.  Strength grossly 4/5.  Pt was doing his own HEP program and was not recieving any formal PT for Knee.   Cervical / Trunk Assessment: Normal  Communication   Communication: No difficulties  Cognition Arousal/Alertness: Awake/alert Behavior During Therapy: WFL for tasks assessed/performed Overall Cognitive Status: Within Functional Limits for tasks assessed                         Exercises Total Joint Exercises Ankle Circles/Pumps: AROM;Both;10 reps;Supine Quad Sets: AROM;Both;10 reps;Supine Short Arc Quad: AROM;Both;10 reps;Supine Heel Slides: AROM;Both;10 reps;Supine Hip ABduction/ADduction: AROM;Both;10 reps;Supine;Standing (hip abduction in standing R only) Long Arc Quad: AROM;Both;10 reps;Supine Knee Flexion: AROM;Right;10 reps;Standing Marching in Standing: AROM;Right;10 reps;Standing Standing Hip Extension: AROM;Right;10 reps;Standing      Assessment/Plan    PT Assessment Patient needs continued PT services  PT Diagnosis Difficulty walking;Abnormality of gait;Generalized weakness;Acute pain   PT Problem List Decreased strength;Decreased range of motion;Decreased activity tolerance;Decreased balance;Decreased mobility;Decreased knowledge of use of DME;Pain  PT Treatment Interventions DME instruction;Gait training;Stair training;Functional mobility training;Therapeutic activities;Therapeutic exercise;Balance training;Neuromuscular re-education;Patient/family education;Modalities   PT Goals (Current goals can be found in the Care Plan section) Acute Rehab PT Goals Patient Stated Goal: to get back to normal ASAP PT Goal Formulation: With patient Time For Goal Achievement: 02/27/14 Potential to Achieve Goals: Good    Frequency Min 5X/week    End of Session   Activity Tolerance: Patient tolerated  treatment well Patient left: in chair;with call bell/phone within reach Nurse Communication: Mobility status         Time: 1215-1257 PT Time Calculation (min) (ACUTE ONLY): 42 min   Charges:   PT Evaluation $Initial PT Evaluation Tier I: 1 Procedure PT Treatments $Gait Training: 8-22 mins $Therapeutic Exercise: 8-22 mins        Ladye Macnaughton B. Kipton Skillen, [place], DPT [phone]   02/20/2014, 1:14 PM

## 2014-02-20 NOTE — Progress Notes (Signed)
[name] to be D/C'd Home per MD order. Discussed with the patient and all questions fully answered.    Medication List    STOP taking these medications        aspirin 81 MG tablet  Replaced by:  aspirin EC 325 MG tablet     naproxen sodium 220 MG tablet  Commonly known as:  ANAPROX      TAKE these medications        amLODipine-benazepril 5-20 MG per capsule  Commonly known as:  LOTREL  Take 1 capsule by mouth daily.     aspirin EC 325 MG tablet  Take 1 tablet (325 mg total) by mouth 2 (two) times daily.     atorvastatin 20 MG tablet  Commonly known as:  LIPITOR  Take 0.5 tablets (10 mg total) by mouth daily.     atorvastatin 10 MG tablet  Commonly known as:  LIPITOR  Take 10 mg by mouth daily.     HYDROcodone-acetaminophen 5-325 MG per tablet  Commonly known as:  NORCO  Take 1-2 tablets by mouth every 4 (four) hours as needed for moderate pain or severe pain.     meloxicam 15 MG tablet  Commonly known as:  MOBIC  Take 15 mg by mouth daily.        VVS, Skin clean, dry and intact without evidence of skin break down, no evidence of skin tears noted.  IV catheter discontinued intact. Site without signs and symptoms of complications. Dressing and pressure applied.  An After Visit Summary was printed and given to the patient.  Patient escorted via [place], and D/C home via private auto.  [name]  02/20/2014 4:52 PM

## 2014-02-20 NOTE — Progress Notes (Signed)
Occupational Therapy Evaluation Patient Details Name: Kerry Adams MRN: [id number] DOB: [birth year] Today's Date: 02/20/2014    History of Present Illness 58 y.o. male admitted to [facility] on 02/18/14 due to fall with resultant R hip fx requring IM nailing on 02/19/14.  Pt with significant PMHx of recent L knee arthroscopy (per pt ~4 weeks ago), HTN, R shoulder surgery, chronic liver disease, dizziness, and arthritis of knees and hips.     Clinical Impression   PTA pt lived at home and was independent with ADLs. Pt is moving well today at Supervision level and was able to don underwear without assist. Pt will require min (A) for LB ADLs and all education and training completed. Acute OT to sign off.     Follow Up Recommendations  No OT follow up;Supervision - Intermittent    Equipment Recommendations  None recommended by OT    Recommendations for Other Services       Precautions / Restrictions Restrictions Weight Bearing Restrictions: Yes RLE Weight Bearing: Weight bearing as tolerated      Mobility Bed Mobility Overal bed mobility: Modified Independent             General bed mobility comments: improved mobility with use of leg lifter  Transfers Overall transfer level: Needs assistance Equipment used: Rolling walker (2 wheeled) Transfers: Sit to/from Stand Sit to Stand: Supervision         General transfer comment: supervision for safety and verbal cues for safe hand placement during transitions.          ADL Overall ADL's : Needs assistance/impaired Eating/Feeding: Independent;Sitting   Grooming: Supervision/safety;Standing   Upper Body Bathing: Set up;Sitting   Lower Body Bathing: Minimal assistance;Sit to/from stand   Upper Body Dressing : Set up;Sitting   Lower Body Dressing: Minimal assistance;Sit to/from stand   Toilet Transfer: Supervision/safety;Ambulation;BSC;RW       Tub/ Shower Transfer: Supervision/safety;Ambulation;3 in 1;Rolling  walker   Functional mobility during ADLs: Supervision/safety;Rolling walker General ADL Comments: Pt d/c home today and addressed all ADL concerns. Educated pt on compensatory techniques, bathroom transfers, and  fall prevention. Pt donned underpants without assist using compensatory technique. Discussed bed mobility and demonstrated use of leg lifter (tie, belt, sheet, etc). Pt satisfied and assisted to dress to prepare for d/c.         Perception Perception Perception Tested?: No   Praxis Praxis Praxis tested?: Within functional limits    Pertinent Vitals/Pain Pain Assessment: 0-10 Pain Score: 7  Pain Location: right hip Pain Descriptors / Indicators: Sore Pain Intervention(s): Limited activity within patient's tolerance;Monitored during session;Repositioned;Ice applied     Hand Dominance Right   Extremity/Trunk Assessment Upper Extremity Assessment Upper Extremity Assessment: Overall WFL for tasks assessed   Lower Extremity Assessment Lower Extremity Assessment: Defer to PT evaluation   Cervical / Trunk Assessment Cervical / Trunk Assessment: Normal   Communication Communication Communication: No difficulties   Cognition Arousal/Alertness: Awake/alert Behavior During Therapy: WFL for tasks assessed/performed Overall Cognitive Status: Within Functional Limits for tasks assessed                                Home Living Family/patient expects to be discharged to:: Private residence Living Arrangements: Spouse/significant other Available Help at Discharge: Family;Available 24 hours/day Type of Home: House Home Access: Stairs to enter [employer] of Steps: 2 Entrance Stairs-Rails: Right;Left;Can reach both Home Layout: Two level;Able to live on main level with  bedroom/bathroom     Bathroom Shower/Tub: Walk-in shower;Curtain         Home Equipment: Environmental consultant - 2 wheels;Crutches;Shower seat;Bedside commode          Prior  Functioning/Environment Level of Independence: Independent        Comments: still works, Aeronautical engineer, part time- standing, minimal lifting.     OT Diagnosis: Generalized weakness;Acute pain    End of Session Equipment Utilized During Treatment: Gait belt;Rolling walker  Activity Tolerance: Patient tolerated treatment well Patient left: Other (comment);with family/visitor present;with nursing/sitter in room   Time: [phone] OT Time Calculation (min): 33 min Charges:  OT General Charges $OT Visit: 1 Procedure OT Evaluation $Initial OT Evaluation Tier I: 1 Procedure OT Treatments $Self Care/Home Management : 23-37 mins G-Codes:    Kerry Adams 2014/02/27, 5:31 PM  Kerry Adams, OTR/L Occupational Therapist [phone] (pager)

## 2014-12-24 NOTE — Progress Notes (Signed)
Cardiology Office Note   Date:  12/25/2014   ID:  Kerry Adams, Kerry Adams 04-Nov-1955, MRN [id number]  PCP:  Vicenta Aly, FNP  Cardiologist:   Dorris Carnes, MD   No chief complaint on file.  Preop risk assessment     History of Present Illness: Kerry Adams is a 59 y.o. male with a  EKG was done that was felt to be abnormal. Cardiac CT was done that showed a calcium score of 784. LAD was 553; LCx was 295 LM 26. I saw him in 2014  Myoview after this visit showed normal perfusion.  He denies CP  Breathing is OK   Does lifing  Can bike.  Otherwise limited by knee         Current Outpatient Prescriptions  Medication Sig Dispense Refill  . acetaminophen (TYLENOL) 500 MG tablet Take 1,000 mg by mouth every 6 (six) hours as needed for mild pain.    Marland Kitchen amLODipine-benazepril (LOTREL) 5-20 MG per capsule Take 1 capsule by mouth daily.    Marland Kitchen aspirin 81 MG tablet Take 81 mg by mouth daily.    Marland Kitchen atorvastatin (LIPITOR) 20 MG tablet Take 10 mg by mouth daily.    Marland Kitchen OVER THE COUNTER MEDICATION Take 1 tablet by mouth daily. Med Name: ACID REDUCER     No current facility-administered medications for this visit.    Allergies:   Review of patient's allergies indicates no known allergies.   Past Medical History  Diagnosis Date  . Hypertension   . Elevated liver function tests   . Chronic liver disease   . Abnormal EKG   . Dizziness   . Arthritis     osteo knees and hips     Past Surgical History  Procedure Laterality Date  . Intertrochanteric hip fracture surgery Right 02/19/2014  . Shoulder surgery Right   . Intramedullary (im) nail intertrochanteric Right 02/19/2014    Procedure: INTRAMEDULLARY (IM) NAIL INTERTROCHANTRIC;  Surgeon: [name], MD;  Location: [facility];  Service: Orthopedics;  Laterality: Right;     Social History:  The patient  reports that he has never smoked. He has never used smokeless tobacco. He reports that he drinks alcohol. He reports that  he does not use illicit drugs.   Family History:  The patient's family history includes Healthy in his brother; Hypertension in his mother; Stroke in his father.    ROS:  Please see the history of present illness. All other systems are reviewed and  Negative to the above problem except as noted.    PHYSICAL EXAM: VS:  BP 124/76 mmHg  Pulse 80  Ht 6\' 1"  (1.854 m)  Wt 231 lb 6.4 oz (104.962 kg)  BMI 30.54 kg/m2  GEN: Well nourished, well developed, in no acute distress HEENT: normal Neck: no JVD, carotid bruits, or masses Cardiac: RRR; no murmurs, rubs, or gallops,no edema  Respiratory:  clear to auscultation bilaterally, normal work of breathing GI: soft, nontender, nondistended, + BS  No hepatomegaly  MS: no deformity Moving all extremities   Skin: warm and dry, no rash Neuro:  Strength and sensation are intact Psych: euthymic mood, full affect   EKG:  EKG is ordered today.  SR 80     Lipid Panel    Component Value Date/Time   CHOL 209* 07/22/2012 0759   TRIG 36.0 07/22/2012 0759   HDL 140.10 07/22/2012 0759   CHOLHDL 1 07/22/2012 0759   VLDL 7.2 07/22/2012 0759   LDLDIRECT 58.5 07/22/2012  0759      Wt Readings from Last 3 Encounters:  12/25/14 231 lb 6.4 oz (104.962 kg)  02/18/14 212 lb 8 oz (96.389 kg)  06/08/12 200 lb (90.719 kg)      ASSESSMENT AND PLAN:  1.  Preop evaluation.  Pt is being evaluated for knee surgery  From a cardiac standpoint I feel he is at low risk and OK to proceed  2.  CAD  Pt with evid of CAD on cardiac CT  Myoview neg  I am not convincied of any acitve symptoms  Will need to f/u with lpids  3.  HTN  BP good  One spell of dizziness appears situational  I would not change meds  4.  HL  Will check wth labs from Dr Tonette Bihari office    F/u 1 year   Signed, Dorris Carnes, MD  12/25/2014 4:55 PM    [address], [place],   [address] Phone: [phone]; Fax: [phone]

## 2014-12-25 ENCOUNTER — Encounter: Payer: Self-pay | Admitting: Internal Medicine

## 2014-12-25 ENCOUNTER — Ambulatory Visit (INDEPENDENT_AMBULATORY_CARE_PROVIDER_SITE_OTHER): Payer: BLUE CROSS/BLUE SHIELD | Admitting: Internal Medicine

## 2014-12-25 VITALS — BP 124/76 | HR 80 | Ht 73.0 in | Wt 231.4 lb

## 2014-12-25 DIAGNOSIS — Z0000 Encounter for general adult medical examination without abnormal findings: Secondary | ICD-10-CM

## 2014-12-25 NOTE — Patient Instructions (Signed)
Medication Instructions:  Your physician recommends that you continue on your current medications as directed. Please refer to the Current Medication list given to you today.   Labwork: none  Testing/Procedures: none  Follow-Up: Your physician wants you to follow-up in: 12 months with Dr. Harrington Challenger. You will receive a reminder letter in the mail two months in advance. If you don't receive a letter, please call our office to schedule the follow-up appointment.   Any Other Special Instructions Will Be Listed Below (If Applicable).     If you need a refill on your cardiac medications before your next appointment, please call your pharmacy.

## 2014-12-27 ENCOUNTER — Telehealth: Payer: Self-pay | Admitting: *Deleted

## 2014-12-27 NOTE — Telephone Encounter (Signed)
Signed surgical clearance for [facility] placed in nurse fax bin in medical records to be faxed.

## 2014-12-27 NOTE — Telephone Encounter (Signed)
Signed surgical clearance request

## 2015-02-02 ENCOUNTER — Other Ambulatory Visit: Payer: Self-pay | Admitting: Orthopedic Surgery

## 2015-02-08 ENCOUNTER — Telehealth: Payer: Self-pay | Admitting: Internal Medicine

## 2015-02-08 NOTE — Telephone Encounter (Signed)
New Message  Pt wife calling to see if Dr Harrington Challenger would prescribe Lotrel for pt- since he was just seen in office (10/31). Please call back and discuss.

## 2015-02-09 MED ORDER — AMLODIPINE BESY-BENAZEPRIL HCL 5-20 MG PO CAPS: 1.0000 | ORAL_CAPSULE | Freq: Every day | ORAL | Status: DC

## 2015-02-09 NOTE — Telephone Encounter (Signed)
Refilled medication; called patient's wife to inform.

## 2015-02-20 ENCOUNTER — Encounter (HOSPITAL_COMMUNITY): Payer: Self-pay

## 2015-02-20 ENCOUNTER — Ambulatory Visit (HOSPITAL_COMMUNITY)
Admission: RE | Admit: 2015-02-20 | Discharge: 2015-02-20 | Disposition: A | Discharge status: Home / Self Care | Payer: BLUE CROSS/BLUE SHIELD | Source: Ambulatory Visit | Attending: Orthopedic Surgery | Admitting: Orthopedic Surgery

## 2015-02-20 ENCOUNTER — Encounter (HOSPITAL_COMMUNITY)
Admission: RE | Admit: 2015-02-20 | Discharge: 2015-02-20 | Disposition: A | Discharge status: Home / Self Care | Payer: BLUE CROSS/BLUE SHIELD | Source: Ambulatory Visit | Attending: Orthopedic Surgery | Admitting: Orthopedic Surgery

## 2015-02-20 DIAGNOSIS — Z01818 Encounter for other preprocedural examination: Secondary | ICD-10-CM | POA: Insufficient documentation

## 2015-02-20 DIAGNOSIS — Z01812 Encounter for preprocedural laboratory examination: Secondary | ICD-10-CM | POA: Diagnosis not present

## 2015-02-20 HISTORY — DX: Gastro-esophageal reflux disease without esophagitis: K21.9

## 2015-02-20 LAB — COMPREHENSIVE METABOLIC PANEL WITH GFR
ALT: 56 U/L (ref 17–63)
AST: 51 U/L — ABNORMAL HIGH (ref 15–41)
Albumin: 4.1 g/dL (ref 3.5–5.0)
Alkaline Phosphatase: 100 U/L (ref 38–126)
Anion gap: 10 (ref 5–15)
BUN: 10 mg/dL (ref 6–20)
CO2: 25 mmol/L (ref 22–32)
Calcium: 9.8 mg/dL (ref 8.9–10.3)
Chloride: 104 mmol/L (ref 101–111)
Creatinine, Ser: 0.66 mg/dL (ref 0.61–1.24)
GFR calc Af Amer: >60 mL/min
GFR calc non Af Amer: >60 mL/min
Glucose, Bld: 114 mg/dL — ABNORMAL HIGH (ref 65–99)
Potassium: 4.1 mmol/L (ref 3.5–5.1)
Sodium: 139 mmol/L (ref 135–145)
Total Bilirubin: 1.4 mg/dL — ABNORMAL HIGH (ref 0.3–1.2)
Total Protein: 7 g/dL (ref 6.5–8.1)

## 2015-02-20 LAB — URINALYSIS, ROUTINE W REFLEX MICROSCOPIC
Bilirubin Urine: NEGATIVE
Glucose, UA: NEGATIVE mg/dL
Hgb urine dipstick: NEGATIVE
Ketones, ur: NEGATIVE mg/dL
Leukocytes, UA: NEGATIVE
Nitrite: NEGATIVE
Protein, ur: NEGATIVE mg/dL
Specific Gravity, Urine: 1.011 (ref 1.005–1.030)
pH: 6.5 (ref 5.0–8.0)

## 2015-02-20 LAB — PROTIME-INR
INR: 1.13 (ref 0.00–1.49)
Prothrombin Time: 14.7 seconds (ref 11.6–15.2)

## 2015-02-20 LAB — CBC WITH DIFFERENTIAL/PLATELET
Basophils Absolute: 0 10*3/uL (ref 0.0–0.1)
Basophils Relative: 1 %
Eosinophils Absolute: 0.1 10*3/uL (ref 0.0–0.7)
Eosinophils Relative: 1 %
HCT: 45.4 % (ref 39.0–52.0)
Hemoglobin: 15.4 g/dL (ref 13.0–17.0)
Lymphocytes Relative: 26 %
Lymphs Abs: 1.3 10*3/uL (ref 0.7–4.0)
MCH: 32.2 pg (ref 26.0–34.0)
MCHC: 33.9 g/dL (ref 30.0–36.0)
MCV: 94.8 fL (ref 78.0–100.0)
Monocytes Absolute: 0.8 10*3/uL (ref 0.1–1.0)
Monocytes Relative: 16 %
Neutro Abs: 2.8 10*3/uL (ref 1.7–7.7)
Neutrophils Relative %: 56 %
Platelets: 111 10*3/uL — ABNORMAL LOW (ref 150–400)
RBC: 4.79 MIL/uL (ref 4.22–5.81)
RDW: 13.3 % (ref 11.5–15.5)
WBC: 4.9 10*3/uL (ref 4.0–10.5)

## 2015-02-20 LAB — APTT: aPTT: 31 seconds (ref 24–37)

## 2015-02-20 LAB — SURGICAL PCR SCREEN
MRSA, PCR: NEGATIVE
Staphylococcus aureus: POSITIVE — AB

## 2015-02-20 NOTE — Pre-Procedure Instructions (Signed)
WAIN MURANO  02/20/2015      [id number] - [place], [place] - [address] [place] [address] Phone: [phone] Fax: [phone]  [facility] [place], [place] [address] Phone: [phone] Fax: [phone]    Your procedure is scheduled on Thursday, January 5th   Report to [facility] Admitting at 8:45 Am   Call this number if you have problems the morning of surgery:  [phone]   Remember:  Do not eat food or drink liquids after midnight Wednesday.  Take these medicines the morning of surgery with A SIP OF WATER : Rantidine   Do not wear jewelry - no rings or watches.  Do not wear lotions or colognes.    You may NOT wear deodorant the day of surgery.             Men may shave face and neck.   Do not bring valuables to the hospital.  [facility] is not responsible for any belongings or valuables.  Contacts, dentures or bridgework may not be worn into surgery.  Leave your suitcase in the car.  After surgery it may be brought to your room. For patients admitted to the hospital, discharge time will be determined by your treatment team.   Name and phone number of your driver:     Please read over the following fact sheets that you were given. Pain Booklet, Coughing and Deep Breathing, MRSA Information and Surgical Site Infection Prevention

## 2015-02-20 NOTE — Progress Notes (Addendum)
PCP is Dr. Cherylann Banas @ [facility]? EKG (2012 or 13) done by her and she was concerned over results.  Send him to see Dr. Dorris Carnes - LOV 11/2014 EKG then was NSR Stress test (he said) 2-3 yrs ago.  Normal.  He said he had Echo--don't see anything in EPIC Denies any chest pains or discomfort.

## 2015-02-28 MED ORDER — CEFAZOLIN SODIUM-DEXTROSE 2-3 GM-% IV SOLR
2.0000 g | INTRAVENOUS | Status: AC
  Administered 2015-03-01: 2 g via INTRAVENOUS
  Filled 2015-02-28: qty 50

## 2015-02-28 MED ORDER — POVIDONE-IODINE 7.5 % EX SOLN
Freq: Once | CUTANEOUS | Status: DC
  Filled 2015-02-28: qty 118

## 2015-03-01 ENCOUNTER — Encounter (HOSPITAL_COMMUNITY): Payer: Self-pay | Admitting: *Deleted

## 2015-03-01 ENCOUNTER — Encounter (HOSPITAL_COMMUNITY)
Admission: RE | Disposition: A | Discharge status: Home Health | Payer: Self-pay | Source: Ambulatory Visit | Attending: Orthopedic Surgery

## 2015-03-01 ENCOUNTER — Inpatient Hospital Stay (HOSPITAL_COMMUNITY): Payer: BLUE CROSS/BLUE SHIELD | Admitting: Anesthesiology

## 2015-03-01 ENCOUNTER — Inpatient Hospital Stay (HOSPITAL_COMMUNITY)
Admission: RE | Admit: 2015-03-01 | Discharge: 2015-03-02 | DRG: 470 | Disposition: A | Discharge status: Home Health | Payer: BLUE CROSS/BLUE SHIELD | Source: Ambulatory Visit | Attending: Orthopedic Surgery | Admitting: Orthopedic Surgery

## 2015-03-01 DIAGNOSIS — Z791 Long term (current) use of non-steroidal anti-inflammatories (NSAID): Secondary | ICD-10-CM

## 2015-03-01 DIAGNOSIS — Z7982 Long term (current) use of aspirin: Secondary | ICD-10-CM

## 2015-03-01 DIAGNOSIS — M1710 Unilateral primary osteoarthritis, unspecified knee: Secondary | ICD-10-CM

## 2015-03-01 DIAGNOSIS — Z79899 Other long term (current) drug therapy: Secondary | ICD-10-CM

## 2015-03-01 DIAGNOSIS — M1712 Unilateral primary osteoarthritis, left knee: Secondary | ICD-10-CM | POA: Diagnosis present

## 2015-03-01 DIAGNOSIS — K219 Gastro-esophageal reflux disease without esophagitis: Secondary | ICD-10-CM | POA: Diagnosis present

## 2015-03-01 DIAGNOSIS — I10 Essential (primary) hypertension: Secondary | ICD-10-CM | POA: Diagnosis present

## 2015-03-01 HISTORY — PX: TOTAL KNEE ARTHROPLASTY: SHX125

## 2015-03-01 HISTORY — DX: Unilateral primary osteoarthritis, unspecified knee: M17.10

## 2015-03-01 SURGERY — ARTHROPLASTY, KNEE, TOTAL
Anesthesia: Spinal | Laterality: Left

## 2015-03-01 MED ORDER — BENAZEPRIL HCL 20 MG PO TABS
20.0000 mg | ORAL_TABLET | Freq: Every day | ORAL | Status: DC
  Administered 2015-03-02: 20 mg via ORAL
  Filled 2015-03-01: qty 1

## 2015-03-01 MED ORDER — DIPHENHYDRAMINE HCL 12.5 MG/5ML PO ELIX: 12.5000 mg | ORAL_SOLUTION | ORAL | Status: DC | PRN

## 2015-03-01 MED ORDER — HYDROMORPHONE HCL 1 MG/ML IJ SOLN
INTRAMUSCULAR | Status: AC
Start: 2015-03-01 — End: 2015-03-02
  Filled 2015-03-01: qty 1

## 2015-03-01 MED ORDER — ONDANSETRON HCL 4 MG PO TABS: 4.0000 mg | ORAL_TABLET | Freq: Four times a day (QID) | ORAL | Status: DC | PRN

## 2015-03-01 MED ORDER — CEFAZOLIN SODIUM-DEXTROSE 2-3 GM-% IV SOLR
2.0000 g | Freq: Four times a day (QID) | INTRAVENOUS | Status: AC
  Administered 2015-03-01 (×2): 2 g via INTRAVENOUS
  Filled 2015-03-01 (×2): qty 50

## 2015-03-01 MED ORDER — MORPHINE SULFATE (PF) 2 MG/ML IV SOLN
2.0000 mg | INTRAVENOUS | Status: DC | PRN
  Administered 2015-03-01 – 2015-03-02 (×4): 2 mg via INTRAVENOUS
  Filled 2015-03-01 (×4): qty 1

## 2015-03-01 MED ORDER — MIDAZOLAM HCL 2 MG/2ML IJ SOLN
INTRAMUSCULAR | Status: AC
  Filled 2015-03-01: qty 2

## 2015-03-01 MED ORDER — DOCUSATE SODIUM 100 MG PO CAPS
100.0000 mg | ORAL_CAPSULE | Freq: Two times a day (BID) | ORAL | Status: DC
  Administered 2015-03-01 – 2015-03-02 (×2): 100 mg via ORAL
  Filled 2015-03-01 (×2): qty 1

## 2015-03-01 MED ORDER — PHENYLEPHRINE HCL 10 MG/ML IJ SOLN
10.0000 mg | INTRAVENOUS | Status: DC | PRN
  Administered 2015-03-01: 75 ug via INTRAVENOUS

## 2015-03-01 MED ORDER — PHENOL 1.4 % MT LIQD: 1.0000 | OROMUCOSAL | Status: DC | PRN

## 2015-03-01 MED ORDER — 0.9 % SODIUM CHLORIDE (POUR BTL) OPTIME
TOPICAL | Status: DC | PRN
  Administered 2015-03-01: 1000 mL

## 2015-03-01 MED ORDER — POLYETHYLENE GLYCOL 3350 17 G PO PACK: 17.0000 g | PACK | Freq: Every day | ORAL | Status: DC | PRN

## 2015-03-01 MED ORDER — PROPOFOL 500 MG/50ML IV EMUL
INTRAVENOUS | Status: DC | PRN
  Administered 2015-03-01: 12:00:00 via INTRAVENOUS
  Administered 2015-03-01: 75 ug/kg/min via INTRAVENOUS

## 2015-03-01 MED ORDER — METHOCARBAMOL 500 MG PO TABS
500.0000 mg | ORAL_TABLET | Freq: Four times a day (QID) | ORAL | Status: DC | PRN
  Administered 2015-03-01 – 2015-03-02 (×3): 500 mg via ORAL
  Filled 2015-03-01 (×2): qty 1

## 2015-03-01 MED ORDER — ATORVASTATIN CALCIUM 10 MG PO TABS
10.0000 mg | ORAL_TABLET | Freq: Every day | ORAL | Status: DC
  Administered 2015-03-01 – 2015-03-02 (×2): 10 mg via ORAL
  Filled 2015-03-01 (×2): qty 1

## 2015-03-01 MED ORDER — ONDANSETRON HCL 4 MG/2ML IJ SOLN
4.0000 mg | Freq: Four times a day (QID) | INTRAMUSCULAR | Status: DC | PRN
Start: 2015-03-01 — End: 2015-03-02
  Administered 2015-03-02: 4 mg via INTRAVENOUS
  Filled 2015-03-01: qty 2

## 2015-03-01 MED ORDER — AMLODIPINE BESY-BENAZEPRIL HCL 5-20 MG PO CAPS: 1.0000 | ORAL_CAPSULE | Freq: Every day | ORAL | Status: DC

## 2015-03-01 MED ORDER — METHOCARBAMOL 500 MG PO TABS
ORAL_TABLET | ORAL | Status: AC
  Filled 2015-03-01: qty 1

## 2015-03-01 MED ORDER — PROMETHAZINE HCL 25 MG/ML IJ SOLN: 6.2500 mg | INTRAMUSCULAR | Status: DC | PRN

## 2015-03-01 MED ORDER — ASPIRIN EC 325 MG PO TBEC
325.0000 mg | DELAYED_RELEASE_TABLET | Freq: Two times a day (BID) | ORAL | Status: DC
  Administered 2015-03-01 – 2015-03-02 (×2): 325 mg via ORAL
  Filled 2015-03-01 (×2): qty 1

## 2015-03-01 MED ORDER — FENTANYL CITRATE (PF) 100 MCG/2ML IJ SOLN
100.0000 ug | Freq: Once | INTRAMUSCULAR | Status: DC
  Filled 2015-03-01: qty 2

## 2015-03-01 MED ORDER — FENTANYL CITRATE (PF) 100 MCG/2ML IJ SOLN
INTRAMUSCULAR | Status: DC | PRN
  Administered 2015-03-01: 25 ug via INTRAVENOUS
  Administered 2015-03-01: 50 ug via INTRAVENOUS
  Administered 2015-03-01: 25 ug via INTRAVENOUS

## 2015-03-01 MED ORDER — LACTATED RINGERS IV SOLN
INTRAVENOUS | Status: DC
  Administered 2015-03-01 (×2): via INTRAVENOUS

## 2015-03-01 MED ORDER — MENTHOL 3 MG MT LOZG: 1.0000 | LOZENGE | OROMUCOSAL | Status: DC | PRN

## 2015-03-01 MED ORDER — ALUM & MAG HYDROXIDE-SIMETH 200-200-20 MG/5ML PO SUSP: 30.0000 mL | ORAL | Status: DC | PRN

## 2015-03-01 MED ORDER — ACETAMINOPHEN 500 MG PO TABS
1000.0000 mg | ORAL_TABLET | Freq: Four times a day (QID) | ORAL | Status: AC
  Administered 2015-03-01 – 2015-03-02 (×4): 1000 mg via ORAL
  Filled 2015-03-01 (×4): qty 2

## 2015-03-01 MED ORDER — FENTANYL CITRATE (PF) 250 MCG/5ML IJ SOLN
INTRAMUSCULAR | Status: AC
  Filled 2015-03-01: qty 5

## 2015-03-01 MED ORDER — METOCLOPRAMIDE HCL 5 MG PO TABS: 5.0000 mg | ORAL_TABLET | Freq: Three times a day (TID) | ORAL | Status: DC | PRN

## 2015-03-01 MED ORDER — SODIUM CHLORIDE 0.9 % IR SOLN
Status: DC | PRN
  Administered 2015-03-01: 1
  Administered 2015-03-01: 1000 mL

## 2015-03-01 MED ORDER — METHOCARBAMOL 1000 MG/10ML IJ SOLN
500.0000 mg | Freq: Four times a day (QID) | INTRAVENOUS | Status: DC | PRN
  Filled 2015-03-01: qty 5

## 2015-03-01 MED ORDER — TRANEXAMIC ACID 1000 MG/10ML IV SOLN
1000.0000 mg | INTRAVENOUS | Status: AC
  Administered 2015-03-01: 1000 mg via INTRAVENOUS
  Filled 2015-03-01: qty 10

## 2015-03-01 MED ORDER — METOCLOPRAMIDE HCL 5 MG/ML IJ SOLN: 5.0000 mg | Freq: Three times a day (TID) | INTRAMUSCULAR | Status: DC | PRN

## 2015-03-01 MED ORDER — OXYCODONE HCL 5 MG PO TABS
ORAL_TABLET | ORAL | Status: AC
Start: 2015-03-01 — End: 2015-03-02
  Filled 2015-03-01: qty 2

## 2015-03-01 MED ORDER — PHENYLEPHRINE HCL 10 MG/ML IJ SOLN
INTRAMUSCULAR | Status: DC | PRN
  Administered 2015-03-01: 100 ug via INTRAVENOUS

## 2015-03-01 MED ORDER — OXYCODONE HCL 5 MG PO TABS
5.0000 mg | ORAL_TABLET | ORAL | Status: DC | PRN
  Administered 2015-03-01 (×2): 10 mg via ORAL
  Administered 2015-03-01: 5 mg via ORAL
  Administered 2015-03-02 (×3): 10 mg via ORAL
  Filled 2015-03-01 (×5): qty 2

## 2015-03-01 MED ORDER — BISACODYL 5 MG PO TBEC: 5.0000 mg | DELAYED_RELEASE_TABLET | Freq: Every day | ORAL | Status: DC | PRN

## 2015-03-01 MED ORDER — BUPIVACAINE IN DEXTROSE 0.75-8.25 % IT SOLN
INTRATHECAL | Status: DC | PRN
  Administered 2015-03-01: 2 mL via INTRATHECAL

## 2015-03-01 MED ORDER — KCL IN DEXTROSE-NACL 20-5-0.45 MEQ/L-%-% IV SOLN
INTRAVENOUS | Status: DC
  Administered 2015-03-01 – 2015-03-02 (×2): via INTRAVENOUS
  Filled 2015-03-01 (×2): qty 1000

## 2015-03-01 MED ORDER — HYDROMORPHONE HCL 1 MG/ML IJ SOLN
0.2500 mg | INTRAMUSCULAR | Status: DC | PRN
  Administered 2015-03-01: 0.5 mg via INTRAVENOUS
  Administered 2015-03-01: 0.53 mg via INTRAVENOUS
  Administered 2015-03-01 (×2): 0.5 mg via INTRAVENOUS

## 2015-03-01 MED ORDER — MIDAZOLAM HCL 2 MG/2ML IJ SOLN
INTRAMUSCULAR | Status: DC | PRN
  Administered 2015-03-01: 2 mg via INTRAVENOUS
  Administered 2015-03-01 (×2): 1 mg via INTRAVENOUS

## 2015-03-01 MED ORDER — ZOLPIDEM TARTRATE 5 MG PO TABS: 5.0000 mg | ORAL_TABLET | Freq: Every evening | ORAL | Status: DC | PRN

## 2015-03-01 MED ORDER — AMLODIPINE BESYLATE 5 MG PO TABS
5.0000 mg | ORAL_TABLET | Freq: Every day | ORAL | Status: DC
  Administered 2015-03-02: 5 mg via ORAL
  Filled 2015-03-01: qty 1

## 2015-03-01 MED ORDER — MIDAZOLAM HCL 2 MG/2ML IJ SOLN
2.0000 mg | Freq: Once | INTRAMUSCULAR | Status: DC
  Filled 2015-03-01: qty 2

## 2015-03-01 MED ORDER — FLEET ENEMA 7-19 GM/118ML RE ENEM: 1.0000 | ENEMA | Freq: Once | RECTAL | Status: DC | PRN

## 2015-03-01 MED ORDER — HYDROMORPHONE HCL 1 MG/ML IJ SOLN
INTRAMUSCULAR | Status: AC
  Filled 2015-03-01: qty 1

## 2015-03-01 MED ORDER — FENTANYL CITRATE (PF) 100 MCG/2ML IJ SOLN
INTRAMUSCULAR | Status: AC
  Filled 2015-03-01: qty 2

## 2015-03-01 SURGICAL SUPPLY — 71 items
BANDAGE ELASTIC 4 VELCRO ST LF (GAUZE/BANDAGES/DRESSINGS) IMPLANT
BANDAGE ESMARK 6X9 LF (GAUZE/BANDAGES/DRESSINGS) ×1 IMPLANT
BLADE SAGITTAL 25.0X1.19X90 (BLADE) ×2 IMPLANT
BLADE SAGITTAL 25.0X1.19X90MM (BLADE) ×1
BLADE SAW SGTL 13X75X1.27 (BLADE) ×3 IMPLANT
BLADE SURG ROTATE 9660 (MISCELLANEOUS) IMPLANT
BNDG ELASTIC 6X10 VLCR STRL LF (GAUZE/BANDAGES/DRESSINGS) IMPLANT
BNDG ELASTIC 6X15 VLCR STRL LF (GAUZE/BANDAGES/DRESSINGS) ×3 IMPLANT
BNDG ESMARK 6X9 LF (GAUZE/BANDAGES/DRESSINGS) ×3
BNDG GAUZE ELAST 4 BULKY (GAUZE/BANDAGES/DRESSINGS) ×3 IMPLANT
BOWL SMART MIX CTS (DISPOSABLE) ×3 IMPLANT
CAP KNEE TOTAL 3 SIGMA ×3 IMPLANT
CEMENT HV SMART SET (Cement) ×6 IMPLANT
CHLORAPREP W/TINT 26ML (MISCELLANEOUS) ×3 IMPLANT
CLOSURE WOUND 1/2 X4 (GAUZE/BANDAGES/DRESSINGS) ×2
COVER SURGICAL LIGHT HANDLE (MISCELLANEOUS) ×3 IMPLANT
CUFF TOURNIQUET SINGLE 34IN LL (TOURNIQUET CUFF) ×3 IMPLANT
DRAPE EXTREMITY T 121X128X90 (DRAPE) ×3 IMPLANT
DRAPE IMP U-DRAPE 54X76 (DRAPES) ×3 IMPLANT
DRAPE PROXIMA HALF (DRAPES) ×3 IMPLANT
DRAPE U-SHAPE 47X51 STRL (DRAPES) ×3 IMPLANT
DRSG ADAPTIC 3X8 NADH LF (GAUZE/BANDAGES/DRESSINGS) ×6 IMPLANT
DRSG AQUACEL AG ADV 3.5X10 (GAUZE/BANDAGES/DRESSINGS) ×3 IMPLANT
DRSG PAD ABDOMINAL 8X10 ST (GAUZE/BANDAGES/DRESSINGS) ×3 IMPLANT
ELECT CAUTERY BLADE 6.4 (BLADE) ×3 IMPLANT
ELECT REM PT RETURN 9FT ADLT (ELECTROSURGICAL) ×3
ELECTRODE REM PT RTRN 9FT ADLT (ELECTROSURGICAL) ×1 IMPLANT
EVACUATOR 1/8 PVC DRAIN (DRAIN) IMPLANT
GAUZE SPONGE 4X4 12PLY STRL (GAUZE/BANDAGES/DRESSINGS) ×3 IMPLANT
GLOVE BIO SURGEON STRL SZ7 (GLOVE) ×6 IMPLANT
GLOVE BIO SURGEON STRL SZ7.5 (GLOVE) ×6 IMPLANT
GLOVE BIOGEL PI IND STRL 6.5 (GLOVE) ×1 IMPLANT
GLOVE BIOGEL PI IND STRL 7.0 (GLOVE) ×1 IMPLANT
GLOVE BIOGEL PI IND STRL 7.5 (GLOVE) ×2 IMPLANT
GLOVE BIOGEL PI IND STRL 8 (GLOVE) ×1 IMPLANT
GLOVE BIOGEL PI INDICATOR 6.5 (GLOVE) ×2
GLOVE BIOGEL PI INDICATOR 7.0 (GLOVE) ×2
GLOVE BIOGEL PI INDICATOR 7.5 (GLOVE) ×4
GLOVE BIOGEL PI INDICATOR 8 (GLOVE) ×2
GOWN STRL REUS W/ TWL LRG LVL3 (GOWN DISPOSABLE) ×4 IMPLANT
GOWN STRL REUS W/ TWL XL LVL3 (GOWN DISPOSABLE) ×4 IMPLANT
GOWN STRL REUS W/TWL LRG LVL3 (GOWN DISPOSABLE) ×8
GOWN STRL REUS W/TWL XL LVL3 (GOWN DISPOSABLE) ×8
HANDPIECE INTERPULSE COAX TIP (DISPOSABLE) ×2
HOOD PEEL AWAY FACE SHEILD DIS (HOOD) ×9 IMPLANT
IMMOBILIZER KNEE 20 (SOFTGOODS) IMPLANT
IMMOBILIZER KNEE 22 UNIV (SOFTGOODS) ×3 IMPLANT
KIT BASIN OR (CUSTOM PROCEDURE TRAY) ×3 IMPLANT
KIT ROOM TURNOVER OR (KITS) ×3 IMPLANT
MANIFOLD NEPTUNE II (INSTRUMENTS) ×3 IMPLANT
NS IRRIG 1000ML POUR BTL (IV SOLUTION) ×3 IMPLANT
PACK TOTAL JOINT (CUSTOM PROCEDURE TRAY) ×3 IMPLANT
PACK UNIVERSAL I (CUSTOM PROCEDURE TRAY) ×3 IMPLANT
PAD ARMBOARD 7.5X6 YLW CONV (MISCELLANEOUS) ×6 IMPLANT
PAD CAST 4YDX4 CTTN HI CHSV (CAST SUPPLIES) ×1 IMPLANT
PADDING CAST COTTON 4X4 STRL (CAST SUPPLIES) ×2
PADDING CAST COTTON 6X4 STRL (CAST SUPPLIES) ×3 IMPLANT
PENCIL BUTTON HOLSTER BLD 10FT (ELECTRODE) ×3 IMPLANT
SET HNDPC FAN SPRY TIP SCT (DISPOSABLE) ×1 IMPLANT
STRIP CLOSURE SKIN 1/2X4 (GAUZE/BANDAGES/DRESSINGS) ×4 IMPLANT
SUCTION FRAZIER TIP 10 FR DISP (SUCTIONS) ×3 IMPLANT
SUT ETHIBOND NAB CT1 #1 30IN (SUTURE) ×3 IMPLANT
SUT MNCRL AB 4-0 PS2 18 (SUTURE) ×3 IMPLANT
SUT VIC AB 0 CT1 27 (SUTURE) ×4
SUT VIC AB 0 CT1 27XBRD ANBCTR (SUTURE) ×2 IMPLANT
SUT VIC AB 1 CTB1 27 (SUTURE) ×6 IMPLANT
SUT VIC AB 2-0 CT1 27 (SUTURE) ×6
SUT VIC AB 2-0 CT1 TAPERPNT 27 (SUTURE) ×3 IMPLANT
TOWEL OR 17X24 6PK STRL BLUE (TOWEL DISPOSABLE) ×3 IMPLANT
TOWEL OR 17X26 10 PK STRL BLUE (TOWEL DISPOSABLE) ×3 IMPLANT
TRAY FOLEY CATH 16FRSI W/METER (SET/KITS/TRAYS/PACK) ×3 IMPLANT

## 2015-03-01 NOTE — Anesthesia Preprocedure Evaluation (Signed)
Anesthesia Evaluation  Patient identified by MRN, date of birth, ID band Patient awake    Reviewed: Allergy & Precautions, NPO status , Patient's Chart, lab work & pertinent test results  Airway Mallampati: II  TM Distance: >3 FB Neck ROM: Full    Dental no notable dental hx.    Pulmonary neg pulmonary ROS,    Pulmonary exam normal breath sounds clear to auscultation       Cardiovascular hypertension, Pt. on medications Normal cardiovascular exam Rhythm:Regular Rate:Normal     Neuro/Psych negative neurological ROS  negative psych ROS   GI/Hepatic Neg liver ROS, GERD  ,  Endo/Other  negative endocrine ROS  Renal/GU negative Renal ROS  negative genitourinary   Musculoskeletal negative musculoskeletal ROS (+)   Abdominal   Peds negative pediatric ROS (+)  Hematology negative hematology ROS (+)   Anesthesia Other Findings   Reproductive/Obstetrics negative OB ROS                             Anesthesia Physical Anesthesia Plan  ASA: II  Anesthesia Plan: Spinal   Post-op Pain Management:    Induction: Intravenous  Airway Management Planned: Simple Face Mask  Additional Equipment:   Intra-op Plan:   Post-operative Plan:   Informed Consent: I have reviewed the patients History and Physical, chart, labs and discussed the procedure including the risks, benefits and alternatives for the proposed anesthesia with the patient or authorized representative who has indicated his/her understanding and acceptance.   Dental advisory given  Plan Discussed with: CRNA and Surgeon  Anesthesia Plan Comments:         Anesthesia Quick Evaluation

## 2015-03-01 NOTE — H&P (Signed)
TOTAL KNEE ADMISSION H&P  Patient is being admitted for left total knee arthroplasty.  Subjective:  Chief Complaint:left knee pain.  HPI: Kerry Adams, 60 y.o. male, has a history of pain and functional disability in the left knee due to arthritis and has failed non-surgical conservative treatments for greater than 12 weeks to includeNSAID's and/or analgesics, corticosteriod injections, viscosupplementation injections, supervised PT with diminished ADL's post treatment and activity modification.  Onset of symptoms was gradual, starting 3 years ago with gradually worsening course since that time. The patient noted prior procedures on the knee to include  arthroscopy on the left knee(s).  Patient currently rates pain in the left knee(s) at 8 out of 10 with activity. Patient has worsening of pain with activity and weight bearing, pain that interferes with activities of daily living and joint swelling.  Patient has evidence of subchondral sclerosis and joint space narrowing by imaging studies.  There is no active infection.  Patient Active Problem List   Diagnosis Date Noted  . Hip fracture requiring operative repair ([place]) 02/19/2014  . Closed right hip fracture ([place]) 02/18/2014  . Hypertension 02/18/2014  . Chronic liver disease 02/18/2014  . Alcohol use ([place]) 02/18/2014   Past Medical History  Diagnosis Date  . Hypertension   . Elevated liver function tests   . Chronic liver disease   . Abnormal EKG   . Dizziness   . Arthritis     osteo knees and hips   . GERD (gastroesophageal reflux disease)     Past Surgical History  Procedure Laterality Date  . Intertrochanteric hip fracture surgery Right 02/19/2014  . Shoulder surgery Right   . Intramedullary (im) nail intertrochanteric Right 02/19/2014    Procedure: INTRAMEDULLARY (IM) NAIL INTERTROCHANTRIC;  Surgeon: [name], MD;  Location: [facility];  Service: Orthopedics;  Laterality: Right;  . Fracture surgery      right  femur  . Hernia repair      umbilical    Prescriptions prior to admission  Medication Sig Dispense Refill Last Dose  . amLODipine-benazepril (LOTREL) 5-20 MG capsule Take 1 capsule by mouth daily. 90 capsule 3 02/28/2015 at Unknown time  . aspirin 81 MG tablet Take 81 mg by mouth daily.   Past Week at Unknown time  . atorvastatin (LIPITOR) 20 MG tablet Take 10 mg by mouth daily.   02/28/2015 at Unknown time  . naproxen sodium (ANAPROX) 220 MG tablet Take 440 mg by mouth 2 (two) times daily with a meal.   Past Week at Unknown time  . RaNITidine HCl (ACID REDUCER PO) Take 1 tablet by mouth daily.   03/01/2015 at 0700   No Known Allergies  Social History  Substance Use Topics  . Smoking status: Never Smoker   . Smokeless tobacco: Never Used  . Alcohol Use: 19.2 oz/week    32 Cans of beer per week     Comment: 4 beers /day    Family History  Problem Relation Age of Onset  . Hypertension Mother   . Stroke Father   . Healthy Brother      Review of Systems  All other systems reviewed and are negative.   Objective:  Physical Exam  Constitutional: He is oriented to person, place, and time. He appears well-developed and well-nourished.  HENT:  Head: Atraumatic.  Eyes: EOM are normal.  Cardiovascular: Intact distal pulses.   Respiratory: Effort normal.  Musculoskeletal:  L knee pain with limited ROM.  Mod effusion.  Neurological: He is  alert and oriented to person, place, and time.  Skin: Skin is warm and dry.  Psychiatric: He has a normal mood and affect.    Vital signs in last 24 hours: Temp:  [97.3 F (36.3 C)] 97.3 F (36.3 C) (01/05 0912) Pulse Rate:  [65] 65 (01/05 0912) Resp:  [18] 18 (01/05 0912) BP: (125)/(82) 125/82 mmHg (01/05 0912) SpO2:  [97 %] 97 % (01/05 0912) Weight:  [104.101 kg (229 lb 8 oz)] 104.101 kg (229 lb 8 oz) (01/05 0912)  Labs:   Estimated body mass index is 30.29 kg/(m^2) as calculated from the following:   Height as of 02/20/15: 6\' 1"  (1.854  m).   Weight as of this encounter: 104.101 kg (229 lb 8 oz).   Imaging Review Plain radiographs demonstrate severe degenerative joint disease of the left knee(s). The overall alignment ismild varus. The bone quality appears to be excellent for age and reported activity level.  Assessment/Plan:  End stage arthritis, left knee   The patient history, physical examination, clinical judgment of the provider and imaging studies are consistent with end stage degenerative joint disease of the left knee(s) and total knee arthroplasty is deemed medically necessary. The treatment options including medical management, injection therapy arthroscopy and arthroplasty were discussed at length. The risks and benefits of total knee arthroplasty were presented and reviewed. The risks due to aseptic loosening, infection, stiffness, patella tracking problems, thromboembolic complications and other imponderables were discussed. The patient acknowledged the explanation, agreed to proceed with the plan and consent was signed. Patient is being admitted for inpatient treatment for surgery, pain control, PT, OT, prophylactic antibiotics, VTE prophylaxis, progressive ambulation and ADL's and discharge planning. The patient is planning to be discharged home with home health services

## 2015-03-01 NOTE — Progress Notes (Signed)
Orthopedic Tech Progress Note Patient Details:  Kerry Adams 02-25-1955 [id number]  CPM Left Knee CPM Left Knee: On Left Knee Flexion (Degrees): 60 Left Knee Extension (Degrees): 0 Additional Comments: Trapeze bar and foot roll   [name] 03/01/2015, 2:46 PM

## 2015-03-01 NOTE — Op Note (Signed)
Procedure(s): TOTAL KNEE ARTHROPLASTY Procedure Note  Kerry Adams male 60 y.o. 03/01/2015  Procedure(s) and Anesthesia Type:     LEFT TOTAL KNEE ARTHROPLASTY - Choice  Surgeon(s) and Role:    * [name], MD - Primary   Indications:  60 y.o. male  With endstage left knee arthritis. Pain and dysfunction interfered with quality of life and nonoperative treatment with activity modification, NSAIDS and injections failed.     Surgeon: [name]   Assistants: Jeanmarie Hubert PA-C [facility] was present and scrubbed throughout the procedure and was essential in positioning, retraction, exposure, and closure)  Anesthesia: Spinal anesthesia    Procedure Detail  Findings: DePuy Sigma  Size 5 femur, size 4 tibia, 10 mm bearing, 41 mm patellar resurfacing. Bone quality was excellent. Full range of motion was restored.   Estimated Blood Loss:  200 mL         Drains:None  Blood Given: none          Specimens: none        Complications:  * No complications entered in OR log *         Disposition: PACU - hemodynamically stable.         Condition: stable   Procedure:   The patient was identified in the preoperative holding area where I personally marked the operative extremity and then was taken to the operating room where He was transferred to the operative table with all extremities padded to   protect against neurovascular compromise.  The patient received 1 g of   IV Ancef preoperatively.  After general endotracheal anesthesia was   induced without complication, the left lower extremity was prepped and draped in the standard sterile fashion.  The appropriate time out procedure was carried out with myself the OR staff and anesthesia staff all verifying site, side and procedure.  An esmarch dressing was used to exsanguinate the limb, and the tourniquet was elevated to 350 mmHg.  An approximately 15-cm incision was made over the anterior knee.   Dissection  was carried down through the subcutaneous tissue and a medial   flap was developed over the vastus medialis.  The knife was then used to   create a medial parapatellar arthrotomy.  A medial release was performed   subperiosteally and dissection was carried beneath the patellar tendon.   The bulk of the patellar fat pad was excised.  Medial and lateral   synovectomy was done over the anterior distal femur.  A small amount of   periosteum was removed in a half-moon shape over the distal femur to   allow later visualization when cutting.  The patella was everted, and   the knee was then flexed.  The medial meniscus was excised.  The osteophytes in the notch were excised using an osteotome. The ACL and   PCL were excised.  The tibia was then subluxated anteriorly, and a bent   Hohmann retractor was placed just lateral to the lateral meniscus.  The   lateral meniscus was then excised.  The proximal tibia was exposed.  The   tibia was then reduced, and Z-retractors were placed medially and   laterally.  The intramedullary drill was used to establish the  intramedullary canal.  The valgus cutting guide was then placed at 5  degrees and a 10-mm cut.  It was pinned in place, and the distal valgus   cut was made with a saw.  Cutting guide was then removed, and attention  was turned to the tibia where again the tibia was subluxated anteriorly   and Z-retractor medially and bent Hohmann laterally.  The extramedullary   tibial guide was then used to establish the proximal tibial cut based on   the palpable anterior tibial crest and based on preoperative templating.   The guide was pinned and the tibia was cut taking care to protect medial   and lateral collateral ligaments.  After the cut was made, the guide was removed,   and the knee was extended.  A 10-mm spacer block was found to be  A good fit and well balanced. the knee easily came to full extension.   The knee was flexed and posterior upsizing  guide was used to obtain a   size 5 femur.  It was pinned for size 5, and the AP cutting guide was   placed.  The cutting guide was used to ensure no anterior notching, and   the anterior-posterior cuts were then made protecting ligaments.   Anterior, posterior, and chamfer cuts were made.  Guide was removed and   flexion gap was assessed with a 10-mm spacer block and found to be well   balanced.  Notch cutting guide was then placed, and the femoral notch was cut.   Guide was removed.  The femoral trial implant was placed and found to   fit well.  Tibial tray was then placed with a 10 tibial trial.  The knee   was balanced in flexion/extension.  Tibial rotation of the tray was   marked with a Bovie on the anterior proximal tibia.  The patella was   then everted and prepared.  The caliper was used to measure the thickness of   the patella.  After patellar cut, the patellar drilling guide was used to   drill the appropriate pegs and trial patella was placed with good   reapproximation of the patellar height and good tracking with no   significant patellar lift-off.  The trial was then removed, and the   trial distal femur and proximal tibial components were removed.  Tibial   retractors were again placed and the tibia was exposed for preparation.   Size 4 tibial tray was found to fit well, it was pinned in place in the   appropriate rotation as previously marked, and excess osteophytes were   removed.  The tibia was then prepared by reaming and broaching.  Pulse   lavage were then used to clean the femur proximal tibia as well as   patella.  They were dried, and the final implants were opened, the   cement was mixed.  Prior to cementing and bone plug from the notch cut   was placed in the distal femur intramedullary hole effectively plugging   it.  The tibia was then cemented and tibial tray was impacted and cement was   cleaned.  It was re-impacted and again the cement was cleaned.    Attention was then turned to the femur where the cement was placed, and   the femur was impacted taking care not to place in flexion.  It was   impacted in place and cement was cleaned.  Tibial trial was then   inserted.  The knee was brought into extension and held into extension   with a slight valgus force. The patella was then cemented and clamped.   This position was held until the cement was completely hardened.  The   knee was then flexed, again  tibial trial was removed.  Bone hook was   used to distract femur from the tibia and moist lap was used to clean   the cement from the posterior femur.  This was copiously irrigated and   all cement was removed.  The final tibial  tray was inserted and impacted   into place.  Final range of motion and stability was excellent with good   patellar tracking.  Tourniquet was let down for a total tourniquet time   of 110 minutes at 350 mmHg.  Small bleeders were coagulated   using electrocautery.  The joint was then irrigated copiously with pulse lavage.  A  drain was placed out the superolateral knee and subsequently the knee was closed with #1 Vicryl  for closure of the arthrotomy,0 Vicryl,  2-0 Vicryl and 4-0 Monocryl for skin closure.    Sterile dressings were then applied including Adaptic, 4x4s,   ABDs, Kerlix, and a large Ace bandage from the toes to the thigh.  The   patient was then placed in a knee immobilizer, allowed to awaken from   general anesthesia, transferred to stretcher, and taken to the recovery   room in stable condition.      POSTOPERATIVE PLAN:  The patient will be on  Aspirin twice daily and SCDs for VTE prophylaxis.  Physical Therapy and CPM will be started in the hospital.

## 2015-03-01 NOTE — Transfer of Care (Signed)
Immediate Anesthesia Transfer of Care Note  Patient: Kerry Adams  Procedure(s) Performed: Procedure(s) with comments: TOTAL KNEE ARTHROPLASTY (Left) - Left knee arthroplasty  Patient Location: PACU  Anesthesia Type:MAC and Spinal  Level of Consciousness: awake, alert  and oriented  Airway & Oxygen Therapy: Patient Spontanous Breathing  Post-op Assessment: Report given to RN, Post -op Vital signs reviewed and stable and Patient moving all extremities  Post vital signs: Reviewed and stable  Last Vitals:  Filed Vitals:   03/01/15 0912 03/01/15 1401  BP: 125/82 120/92  Pulse: 65 95  Temp: 36.3 C 36.3 C  Resp: 18 21    Complications: No apparent anesthesia complications

## 2015-03-01 NOTE — Anesthesia Postprocedure Evaluation (Signed)
Anesthesia Post Note  Patient: Kerry Adams  Procedure(s) Performed: Procedure(s) (LRB): TOTAL KNEE ARTHROPLASTY (Left)  Patient location during evaluation: PACU Anesthesia Type: MAC and Regional Level of consciousness: awake and awake and alert Pain management: pain level controlled Vital Signs Assessment: post-procedure vital signs reviewed and stable Respiratory status: spontaneous breathing and nonlabored ventilation Anesthetic complications: no    Last Vitals:  Filed Vitals:   03/01/15 0912 03/01/15 1401  BP: 125/82 120/92  Pulse: 65 95  Temp: 36.3 C 36.3 C  Resp: 18 21    Last Pain:  Filed Vitals:   03/01/15 1405  PainSc: 0-No pain                 Hattye Siegfried COKER

## 2015-03-01 NOTE — Progress Notes (Signed)
Utilization review completed.  

## 2015-03-01 NOTE — Anesthesia Procedure Notes (Signed)
Spinal Patient location during procedure: OR Staffing Performed by: anesthesiologist  Preanesthetic Checklist Completed: patient identified, site marked, surgical consent, pre-op evaluation, timeout performed, IV checked, risks and benefits discussed and monitors and equipment checked Spinal Block Patient position: sitting Prep: Betadine Patient monitoring: heart rate, continuous pulse ox and blood pressure Injection technique: single-shot Needle Needle type: Spinocan  Needle gauge: 22 G Needle length: 9 cm Additional Notes Attempt x 3 prior to obtaining CSF   Expiration date of kit checked and confirmed. Patient tolerated procedure well, without complications.

## 2015-03-02 ENCOUNTER — Encounter (HOSPITAL_COMMUNITY): Payer: Self-pay | Admitting: Orthopedic Surgery

## 2015-03-02 LAB — CBC
HCT: 35.6 % — ABNORMAL LOW (ref 39.0–52.0)
Hemoglobin: 11.8 g/dL — ABNORMAL LOW (ref 13.0–17.0)
MCH: 31.5 pg (ref 26.0–34.0)
MCHC: 33.1 g/dL (ref 30.0–36.0)
MCV: 94.9 fL (ref 78.0–100.0)
Platelets: 127 10*3/uL — ABNORMAL LOW (ref 150–400)
RBC: 3.75 MIL/uL — ABNORMAL LOW (ref 4.22–5.81)
RDW: 12.9 % (ref 11.5–15.5)
WBC: 7.5 10*3/uL (ref 4.0–10.5)

## 2015-03-02 LAB — BASIC METABOLIC PANEL
Anion gap: 7 (ref 5–15)
BUN: 8 mg/dL (ref 6–20)
CO2: 26 mmol/L (ref 22–32)
Calcium: 8.6 mg/dL — ABNORMAL LOW (ref 8.9–10.3)
Chloride: 101 mmol/L (ref 101–111)
Creatinine, Ser: 0.61 mg/dL (ref 0.61–1.24)
GFR calc Af Amer: >60 mL/min (ref >60)
GFR calc non Af Amer: >60 mL/min (ref >60)
Glucose, Bld: 135 mg/dL — ABNORMAL HIGH (ref 65–99)
Potassium: 3.7 mmol/L (ref 3.5–5.1)
Sodium: 134 mmol/L — ABNORMAL LOW (ref 135–145)

## 2015-03-02 MED ORDER — OXYCODONE-ACETAMINOPHEN 5-325 MG PO TABS: 1.0000 | ORAL_TABLET | ORAL | Status: DC | PRN

## 2015-03-02 MED ORDER — ASPIRIN EC 325 MG PO TBEC: 325.0000 mg | DELAYED_RELEASE_TABLET | Freq: Two times a day (BID) | ORAL | Status: DC

## 2015-03-02 MED ORDER — METHOCARBAMOL 500 MG PO TABS: 500.0000 mg | ORAL_TABLET | Freq: Three times a day (TID) | ORAL | Status: DC

## 2015-03-02 MED ORDER — DOCUSATE SODIUM 100 MG PO CAPS: 100.0000 mg | ORAL_CAPSULE | Freq: Three times a day (TID) | ORAL | Status: DC | PRN

## 2015-03-02 NOTE — Progress Notes (Signed)
Physical Therapy Treatment Patient Details Name: Kerry Adams MRN: [id number] DOB: April 25, 1955 Today's Date: 03/02/2015    History of Present Illness 60 yo admitted for L TKA. PMHx: R hip fx, HTN, right knee OA    PT Comments    Pt remains pleasant and motivated for activity. Pt with increased gait distance, knee ROM and stair training this session. Wife and pt educated for HEP, stairs and progression with all questions answered. Pt in bone foam end of session and safe for D/C home.   Follow Up Recommendations  Home health PT     Equipment Recommendations       Recommendations for Other Services       Precautions / Restrictions Precautions Precautions: Knee Restrictions LLE Weight Bearing: Weight bearing as tolerated    Mobility  Bed Mobility               General bed mobility comments: in chair on arrival  Transfers Overall transfer level: Needs assistance     Sit to Stand: Supervision         General transfer comment: cues for hand placement and safety  Ambulation/Gait Ambulation/Gait assistance: Supervision Ambulation Distance (Feet): 120 Feet Assistive device: Rolling walker (2 wheeled) Gait Pattern/deviations: Step-through pattern;Decreased stride length;Decreased dorsiflexion - left   Gait velocity interpretation: Below normal speed for age/gender General Gait Details: cues for posture, position in RW, looking up and heel strike on LLE   Stairs Stairs: Yes Stairs assistance: Min assist Stair Management: Backwards;With walker Number of Stairs: 4 General stair comments: cues for sequence and min assist for RW stability, handout provided and wife educated  Wheelchair Mobility    Modified Rankin (Stroke Patients Only)       Balance                                    Cognition Arousal/Alertness: Awake/alert Behavior During Therapy: WFL for tasks assessed/performed Overall Cognitive Status: Within Functional Limits for  tasks assessed                      Exercises Total Joint Exercises Hip ABduction/ADduction: AROM;Both;15 reps;Seated Straight Leg Raises: AROM;Seated;Left;10 reps Long Arc Quad: AROM;Left;15 reps;Seated Goniometric ROM: 5-75 Marching in Standing: AROM;Left;15 reps;Seated    General Comments        Pertinent Vitals/Pain Pain Score: 6  Pain Location: left knee Pain Descriptors / Indicators: Aching Pain Intervention(s): Limited activity within patient's tolerance;Monitored during session;Premedicated before session;Repositioned;Ice applied    Home Living                      Prior Function            PT Goals (current goals can now be found in the care plan section) Progress towards PT goals: Progressing toward goals    Frequency       PT Plan Current plan remains appropriate    Co-evaluation             End of Session   Activity Tolerance: Patient tolerated treatment well Patient left: in chair;with call bell/phone within reach;with family/visitor present     Time: [id number] PT Time Calculation (min) (ACUTE ONLY): 33 min  Charges:  $Gait Training: 8-22 mins $Therapeutic Exercise: 8-22 mins                    G Codes:  Lanetta Inch Beth 03/02/2015, 12:18 PM Elwyn Reach, [place]

## 2015-03-02 NOTE — Discharge Instructions (Signed)

## 2015-03-02 NOTE — Progress Notes (Signed)
   PATIENT ID: Kerry Adams   1 Day Post-Op Procedure(s) (LRB): TOTAL KNEE ARTHROPLASTY (Left)  Subjective: Doing well, up in the chair. Reports feeling a little dizzy getting up this morning but improving. Pain overnight but improving.   Objective:  Filed Vitals:   03/02/15 0219 03/02/15 0500  BP: 134/73 140/75  Pulse: 77 74  Temp: 98.4 F (36.9 C) 98.2 F (36.8 C)  Resp: 20 20     L knee dressing c/d/i Wiggles toes Calf soft, nontender Distally NVI  Labs:   Recent Labs  03/02/15 0552  HGB 11.8*   Recent Labs  03/02/15 0552  WBC 7.5  RBC 3.75*  HCT 35.6*  PLT 127*   Recent Labs  03/02/15 0552  NA 134*  K 3.7  CL 101  CO2 26  BUN 8  CREATININE 0.61  GLUCOSE 135*  CALCIUM 8.6*    Assessment and Plan: 1 day s/p L TKA Up with PT today, did well this am If cleared by PT and pain under control with oral Percocet, can d/c home today Scripts in chart WBAT D/c knee immobilizer Dressing change by nurse right before d/c this afternoon to an Aquacell dressing (in room) Fu with Dr. Tamera Punt in 2 weeks  VTE proph: ASA 325mg  BID x 3 weeks, SCDs

## 2015-03-02 NOTE — Discharge Summary (Signed)
Patient ID: JULIES PAUMEN MRN: [id number] DOB/AGE: 1955-08-02 60 y.o.  Admit date: 03/01/2015 Discharge date: 03/02/2015  Admission Diagnoses:  Active Problems:   Osteoarthritis of knee, unilateral   Discharge Diagnoses:  Same  Past Medical History  Diagnosis Date  . Hypertension   . Elevated liver function tests   . Chronic liver disease   . Abnormal EKG   . Dizziness   . Arthritis     osteo knees and hips   . GERD (gastroesophageal reflux disease)     Surgeries: Procedure(s): TOTAL KNEE ARTHROPLASTY on 03/01/2015   Consultants:    Discharged Condition: Improved  Hospital Course: [place] is an 60 y.o. male who was admitted 03/01/2015 for operative treatment of left knee osteoarthritis. Patient has severe unremitting pain that affects sleep, daily activities, and work/hobbies. After pre-op clearance the patient was taken to the operating room on 03/01/2015 and underwent  Procedure(s): TOTAL KNEE ARTHROPLASTY.    Patient was given perioperative antibiotics: Anti-infectives    Start     Dose/Rate Route Frequency Ordered Stop   03/01/15 1700  ceFAZolin (ANCEF) IVPB 2 g/50 mL premix     2 g 100 mL/hr over 30 Minutes Intravenous Every 6 hours 03/01/15 1632 03/01/15 2312   03/01/15 1030  ceFAZolin (ANCEF) IVPB 2 g/50 mL premix     2 g 100 mL/hr over 30 Minutes Intravenous To ShortStay Surgical 02/28/15 1137 03/01/15 1058       Patient was given sequential compression devices, early ambulation, and ASA 325mg  BID to prevent DVT.  Patient benefited maximally from hospital stay and there were no complications.    Recent vital signs: Patient Vitals for the past 24 hrs:  BP Temp Temp src Pulse Resp SpO2  03/02/15 0953 140/75 mmHg - - - - -  03/02/15 0500 140/75 mmHg 98.2 F (36.8 C) Oral 74 20 93 %  03/02/15 0219 134/73 mmHg 98.4 F (36.9 C) Oral 77 20 94 %  03/01/15 2100 (!) 145/80 mmHg 98.2 F (36.8 C) Oral 87 20 96 %  03/01/15 1631 127/77 mmHg 97.9 F (36.6 C) Oral  74 18 95 %  03/01/15 1545 131/81 mmHg 97.8 F (36.6 C) - 69 18 97 %  03/01/15 1530 127/80 mmHg - - 64 (!) 24 97 %  03/01/15 1515 132/84 mmHg - - 73 15 94 %  03/01/15 1510 - - - 65 (!) 24 95 %  03/01/15 1500 120/76 mmHg - - 63 (!) 22 93 %  03/01/15 1445 114/79 mmHg - - 75 18 97 %  03/01/15 1430 117/80 mmHg - - 80 (!) 22 97 %     Recent laboratory studies:  Recent Labs  03/02/15 0552  WBC 7.5  HGB 11.8*  HCT 35.6*  PLT 127*  NA 134*  K 3.7  CL 101  CO2 26  BUN 8  CREATININE 0.61  GLUCOSE 135*  CALCIUM 8.6*     Discharge Medications:     Medication List    STOP taking these medications        aspirin 81 MG tablet  Replaced by:  aspirin EC 325 MG tablet     naproxen sodium 220 MG tablet  Commonly known as:  ANAPROX      TAKE these medications        ACID REDUCER PO  Take 1 tablet by mouth daily.     amLODipine-benazepril 5-20 MG capsule  Commonly known as:  LOTREL  Take 1 capsule by mouth daily.  aspirin EC 325 MG tablet  Take 1 tablet (325 mg total) by mouth 2 (two) times daily.     atorvastatin 20 MG tablet  Commonly known as:  LIPITOR  Take 10 mg by mouth daily.     docusate sodium 100 MG capsule  Commonly known as:  COLACE  Take 1 capsule (100 mg total) by mouth 3 (three) times daily as needed.     methocarbamol 500 MG tablet  Commonly known as:  ROBAXIN  Take 1 tablet (500 mg total) by mouth 3 (three) times daily.     oxyCODONE-acetaminophen 5-325 MG tablet  Commonly known as:  ROXICET  Take 1-2 tablets by mouth every 4 (four) hours as needed for severe pain.        Diagnostic Studies: Dg Chest 2 View  02/20/2015  CLINICAL DATA:  Preoperative chest x-ray.  Knee replacement. EXAM: CHEST  2 VIEW COMPARISON:  None. FINDINGS: Heart is normal size. Mediastinal contours are within normal limits. Mild peribronchial thickening. No confluent opacities or effusions. No acute bony abnormality. IMPRESSION: Mild bronchitic changes. Electronically  Signed   By: Rolm Baptise M.D.   On: 02/20/2015 10:24    Disposition: 01-Home or Self Care      Discharge Instructions    Call MD / Call 911    Complete by:  As directed   If you experience chest pain or shortness of breath, CALL 911 and be transported to the hospital emergency room.  If you develope a fever above 101 F, pus (white drainage) or increased drainage or redness at the wound, or calf pain, call your surgeon's office.     Constipation Prevention    Complete by:  As directed   Drink plenty of fluids.  Prune juice may be helpful.  You may use a stool softener, such as Colace (over the counter) 100 mg twice a day.  Use MiraLax (over the counter) for constipation as needed.     Diet - low sodium heart healthy    Complete by:  As directed      Increase activity slowly as tolerated    Complete by:  As directed      Weight bearing as tolerated    Complete by:  As directed            Follow-up Information    Follow up with [name], MD. Schedule an appointment as soon as possible for a visit in 2 weeks.   Specialty:  Orthopedic Surgery   Contact information:   [address] [place] [address] [phone]       Follow up with [facility].   Why:  They will contact you to schedule home therapy visits.   Contact information:   [address] [place] [address] [phone]        Signed: Grier Mitts 03/02/2015, 2:24 PM

## 2015-03-02 NOTE — Evaluation (Addendum)
Physical Therapy Evaluation Patient Details Name: Kerry Adams MRN: [id number] DOB: [birth year] Today's Date: 03/02/2015   History of Present Illness  60 yo admitted for L TKA. PMHx: R hip fx, HTN, right knee OA  Clinical Impression  Pt very pleasant and willing to perform all activity. Pt with decreased strength/ROM and transfers secondary to pain post op. Pt will benefit from acute therapy to maximize mobility, function, gait and ROM to decrease burden of care and normalize gait.     Follow Up Recommendations Home health PT    Equipment Recommendations  Rolling walker with 5" wheels;3in1 (PT)    Recommendations for Other Services       Precautions / Restrictions Precautions Precautions: Knee Precaution Comments: KI in room but verbal order from [place] that its not needed Restrictions LLE Weight Bearing: Weight bearing as tolerated      Mobility  Bed Mobility Overal bed mobility: Modified Independent                Transfers Overall transfer level: Needs assistance   Transfers: Sit to/from Stand Sit to Stand: Supervision         General transfer comment: cues for hand placement and safety  Ambulation/Gait Ambulation/Gait assistance: Supervision Ambulation Distance (Feet): 60 Feet Assistive device: Rolling walker (2 wheeled) Gait Pattern/deviations: Step-through pattern;Decreased stride length;Decreased dorsiflexion - left   Gait velocity interpretation: Below normal speed for age/gender General Gait Details: cues for posture, position in RW, looking up and heel strike on LLE  Stairs            Wheelchair Mobility    Modified Rankin (Stroke Patients Only)       Balance                                             Pertinent Vitals/Pain Pain Assessment: 0-10 Pain Score: 7  Pain Location: left knee pain Pain Descriptors / Indicators: Aching;Throbbing Pain Intervention(s): Limited activity within patient's  tolerance;Premedicated before session;Repositioned;Ice applied;Monitored during session    Home Living Family/patient expects to be discharged to:: Private residence Living Arrangements: Spouse/significant other Available Help at Discharge: Available 24 hours/day;Family Type of Home: House Home Access: Stairs to enter Entrance Stairs-Rails: Right;Left;Can reach both Entrance Stairs-Number of Steps: 2 Home Layout: Two level;Able to live on main level with bedroom/bathroom Home Equipment: Crutches;Toilet riser      Prior Function Level of Independence: Independent         Comments: works as a Aeronautical engineer, 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 due to knee pain     Hand Dominance        Extremity/Trunk Assessment   Upper Extremity Assessment: Overall WFL for tasks assessed           Lower Extremity Assessment: LLE deficits/detail   LLE Deficits / Details: decreased strength and ROM due to post op pain  Cervical / Trunk Assessment: Normal  Communication   Communication: No difficulties  Cognition Arousal/Alertness: Awake/alert Behavior During Therapy: WFL for tasks assessed/performed Overall Cognitive Status: Within Functional Limits for tasks assessed                      General Comments      Exercises Total Joint Exercises Quad Sets: AROM;Left;10 reps;Supine Heel Slides: AAROM;Left;10 reps;Supine Straight Leg Raises: AROM;Left;5 reps;Supine      Assessment/Plan  PT Assessment Patient needs continued PT services  PT Diagnosis Difficulty walking;Acute pain   PT Problem List Decreased strength;Decreased range of motion;Decreased activity tolerance;Decreased balance;Decreased mobility;Pain;Decreased knowledge of use of DME  PT Treatment Interventions DME instruction;Gait training;Stair training;Functional mobility training;Therapeutic activities;Therapeutic exercise;Patient/family education   PT Goals (Current goals can be  found in the Care Plan section) Acute Rehab PT Goals Patient Stated Goal: walk without pain PT Goal Formulation: With patient/family Time For Goal Achievement: 03/09/15 Potential to Achieve Goals: Good    Frequency 7X/week   Barriers to discharge        Co-evaluation               End of Session Equipment Utilized During Treatment: Gait belt Activity Tolerance: Patient tolerated treatment well Patient left: in chair;with call bell/phone within reach;with family/visitor present Nurse Communication: Mobility status;Weight bearing status         Time: 0715-0738 PT Time Calculation (min) (ACUTE ONLY): 23 min   Charges:   PT Evaluation $PT Eval Moderate Complexity: 1 Procedure PT Treatments $Gait Training: 8-22 mins   PT G CodesMelford Aase 03/02/2015, 8:11 AM Elwyn Reach, [place]

## 2015-03-02 NOTE — Care Management Note (Signed)
Case Management Note  Patient Details  Name: Kerry Adams MRN: [id number] Date of Birth: [birth year]  Subjective/Objective:          S/p left total knee arthroplasty          Action/Plan: Set up with [name] for HHPT by MD office. Spoke with patient and wife, no change in discharge plan. Patient states that he will have family available to assist after discharge. Contacted [name] with [facility], rolling walker and 3N1 delivered to patient's room. Contacted [name] with [place], no home CPM ordered.      Expected Discharge Date:                  Expected Discharge Plan:  [name]  In-House Referral:  NA  Discharge planning Services  CM Consult  Post Acute Care Choice:  Durable Medical Equipment, Home Health Choice offered to:  Patient  DME Arranged:  3-N-1, Walker rolling DME Agency:  [place]:  PT [place]:  [place]  Status of Service:  Completed, signed off  Medicare Important Message Given:    Date Medicare IM Given:    Medicare IM give by:    Date Additional Medicare IM Given:    Additional Medicare Important Message give by:     If discussed at [place] of Stay Meetings, dates discussed:    Additional Comments:  Nila Nephew, RN 03/02/2015, 12:41 PM

## 2015-03-02 NOTE — Progress Notes (Signed)
Occupational Therapy Evaluation Patient Details Name: Kerry Adams MRN: [id number] DOB: March 28, 1955 Today's Date: 03/02/2015    History of Present Illness 60 yo admitted for L TKA. PMHx: R hip fx, HTN, right knee OA   Clinical Impression   Patient up in chair upon OT arrival.  He is able to perform functional mobility at supervision level.  OT educated patient and wife on ADLs, AE and DME use.  No further OT needed.  Patient plans to d/c home today.    Follow Up Recommendations  No OT follow up;Supervision/Assistance - 24 hour    Equipment Recommendations  None recommended by OT    Recommendations for Other Services       Precautions / Restrictions Precautions Precautions: Knee Restrictions LLE Weight Bearing: Weight bearing as tolerated      Mobility Bed Mobility Overal bed mobility:  (not assessed- pt up in chair)             General bed mobility comments: in chair on arrival  Transfers Overall transfer level: Needs assistance Equipment used: Rolling walker (2 wheeled) Transfers: Sit to/from Stand Sit to Stand: Supervision         General transfer comment: verbal cues for hand placement    Balance                                            ADL Overall ADL's : Needs assistance/impaired Eating/Feeding: Independent;Sitting   Grooming: Wash/dry hands;Set up;Sitting                   Toilet Transfer: Supervision/safety;Ambulation           Functional mobility during ADLs: Supervision/safety General ADL Comments: Patient up in chair, fully dressed, upon OT arrival. Patient reports wife assisted him with getting dressed and she will be home to assist him with ADLs.  Therapist educated patient and wife on ADL LB dressing technique and use of reacher as needed at home.  Also recommended use of 3n1 over toilet at home and as a shower chair.       Vision     Perception     Praxis      Pertinent Vitals/Pain Pain  Assessment: 0-10 Pain Score: 5  Pain Location: left knee Pain Descriptors / Indicators: Aching Pain Intervention(s): Premedicated before session     Hand Dominance     Extremity/Trunk Assessment Upper Extremity Assessment Upper Extremity Assessment: Overall WFL for tasks assessed           Communication Communication Communication: No difficulties   Cognition Arousal/Alertness: Awake/alert Behavior During Therapy: WFL for tasks assessed/performed Overall Cognitive Status: Within Functional Limits for tasks assessed                     General Comments       Exercises       Shoulder Instructions      Home Living Family/patient expects to be discharged to:: Private residence Living Arrangements: Spouse/significant other Available Help at Discharge: Available 24 hours/day;Family Type of Home: House Home Access: Stairs to enter [employer] of Steps: 2 Entrance Stairs-Rails: Right;Left;Can reach both Home Layout: Two level;Able to live on main level with bedroom/bathroom     Bathroom Shower/Tub: Walk-in shower;Curtain   Bathroom Toilet: Handicapped height     Home Equipment: Crutches;Toilet riser (3n1 had just been delivered to room)  Prior Functioning/Environment Level of Independence: Independent        Comments: works as a Aeronautical engineer, 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

## 2015-04-26 ENCOUNTER — Other Ambulatory Visit: Payer: Self-pay | Admitting: Orthopedic Surgery

## 2015-05-09 NOTE — Pre-Procedure Instructions (Signed)
DENCIL FETTEROLF  05/09/2015      [id number] - [place], [place] - [address] [place] [address] Phone: [phone] Fax: [phone]  [facility] - [place], [place] [address] Phone: [phone] Fax: [phone]    Your procedure is scheduled on March 29  Report to [facility] at [phone].  Call this number if you have problems the morning of surgery:  [phone]   Remember:  Do not eat food or drink liquids after midnight.  Take these medicines the morning of surgery with A SIP OF WATER tylenol if needed, ranitidine HCl   Stop taking aspirin, Ibuprofen, Advil, Motrin, Aleve, Herbal medications, Fish Oil, BC's, Goody's   Do not wear jewelry, make-up or nail polish.  Do not wear lotions, powders, or perfumes.  You may wear deodorant.  Do not shave 48 hours prior to surgery.  Men may shave face and neck.  Do not bring valuables to the hospital.  [facility] is not responsible for any belongings or valuables.  Contacts, dentures or bridgework may not be worn into surgery.  Leave your suitcase in the car.  After surgery it may be brought to your room.  For patients admitted to the hospital, discharge time will be determined by your treatment team.  Patients discharged the day of surgery will not be allowed to drive home.  Special instructions:   - Preparing for Surgery  Before surgery, you can play an important role.  Because skin is not sterile, your skin needs to be as free of germs as possible.  You can reduce the number of germs on you skin by washing with CHG (chlorahexidine gluconate) soap before surgery.  CHG is an antiseptic cleaner which kills germs and bonds with the skin to continue killing germs even after washing.  Please DO NOT use if you have an allergy to CHG or antibacterial soaps.  If  your skin becomes reddened/irritated stop using the CHG and inform your nurse when you arrive at Short Stay.  Do not shave (including legs and underarms) for at least 48 hours prior to the first CHG shower.  You may shave your face.  Please follow these instructions carefully:   1.  Shower with CHG Soap the night before surgery and the   morning of Surgery.  2.  If you choose to wash your hair, wash your hair first as usual with your  normal shampoo.  3.  After you shampoo, rinse your hair and body thoroughly to remove the  Shampoo.  4.  Use CHG as you would any other liquid soap.  You can apply chg directly   to the skin and wash gently with scrungie or a clean washcloth.  5.  Apply the CHG Soap to your body ONLY FROM THE NECK DOWN.   Do not use on open wounds or open sores.  Avoid contact with your eyes,   ears, mouth and genitals (private parts).  Wash genitals (private parts)  with your normal soap.  6.  Wash thoroughly, paying special attention to the area where your surgery  will be performed.  7.  Thoroughly rinse your body with warm water from the neck down.  8.  DO NOT shower/wash with your normal soap after using and rinsing off  the CHG Soap.  9.  Pat yourself dry with a clean towel.            10.  Wear clean pajamas.            11.  Place clean sheets on your bed the night of your first shower and do not  sleep with pets.  Day of Surgery  Do not apply any lotions/deoderants the morning of surgery.  Please wear clean clothes to the hospital/surgery center.     Please read over the following fact sheets that you were given. Pain Booklet, Coughing and Deep Breathing, MRSA Information and Surgical Site Infection Prevention

## 2015-05-10 ENCOUNTER — Encounter (HOSPITAL_COMMUNITY)
Admission: RE | Admit: 2015-05-10 | Discharge: 2015-05-10 | Disposition: A | Discharge status: Home / Self Care | Payer: BLUE CROSS/BLUE SHIELD | Source: Ambulatory Visit | Attending: Orthopedic Surgery | Admitting: Orthopedic Surgery

## 2015-05-10 ENCOUNTER — Encounter (HOSPITAL_COMMUNITY): Payer: Self-pay

## 2015-05-10 DIAGNOSIS — M1711 Unilateral primary osteoarthritis, right knee: Secondary | ICD-10-CM | POA: Insufficient documentation

## 2015-05-10 DIAGNOSIS — Z0183 Encounter for blood typing: Secondary | ICD-10-CM | POA: Diagnosis not present

## 2015-05-10 DIAGNOSIS — Z01812 Encounter for preprocedural laboratory examination: Secondary | ICD-10-CM | POA: Diagnosis present

## 2015-05-10 LAB — CBC WITH DIFFERENTIAL/PLATELET
Basophils Absolute: 0 10*3/uL (ref 0.0–0.1)
Basophils Relative: 0 %
Eosinophils Absolute: 0.1 10*3/uL (ref 0.0–0.7)
Eosinophils Relative: 1 %
HCT: 44.9 % (ref 39.0–52.0)
Hemoglobin: 15.3 g/dL (ref 13.0–17.0)
Lymphocytes Relative: 36 %
Lymphs Abs: 1.8 10*3/uL (ref 0.7–4.0)
MCH: 32.6 pg (ref 26.0–34.0)
MCHC: 34.1 g/dL (ref 30.0–36.0)
MCV: 95.5 fL (ref 78.0–100.0)
Monocytes Absolute: 0.5 10*3/uL (ref 0.1–1.0)
Monocytes Relative: 10 %
Neutro Abs: 2.6 10*3/uL (ref 1.7–7.7)
Neutrophils Relative %: 53 %
Platelets: 144 10*3/uL — ABNORMAL LOW (ref 150–400)
RBC: 4.7 MIL/uL (ref 4.22–5.81)
RDW: 14.3 % (ref 11.5–15.5)
WBC: 5 10*3/uL (ref 4.0–10.5)

## 2015-05-10 LAB — COMPREHENSIVE METABOLIC PANEL
ALT: 66 U/L — ABNORMAL HIGH (ref 17–63)
AST: 69 U/L — ABNORMAL HIGH (ref 15–41)
Albumin: 4.1 g/dL (ref 3.5–5.0)
Alkaline Phosphatase: 104 U/L (ref 38–126)
Anion gap: 12 (ref 5–15)
BUN: 10 mg/dL (ref 6–20)
CO2: 18 mmol/L — ABNORMAL LOW (ref 22–32)
Calcium: 9.6 mg/dL (ref 8.9–10.3)
Chloride: 108 mmol/L (ref 101–111)
Creatinine, Ser: 0.65 mg/dL (ref 0.61–1.24)
GFR calc Af Amer: >60 mL/min (ref >60)
GFR calc non Af Amer: >60 mL/min (ref >60)
Glucose, Bld: 86 mg/dL (ref 65–99)
Potassium: 4.3 mmol/L (ref 3.5–5.1)
Sodium: 138 mmol/L (ref 135–145)
Total Bilirubin: 0.4 mg/dL (ref 0.3–1.2)
Total Protein: 7.3 g/dL (ref 6.5–8.1)

## 2015-05-10 LAB — URINALYSIS, ROUTINE W REFLEX MICROSCOPIC
Bilirubin Urine: NEGATIVE
Glucose, UA: NEGATIVE mg/dL
Hgb urine dipstick: NEGATIVE
Ketones, ur: NEGATIVE mg/dL
Leukocytes, UA: NEGATIVE
Nitrite: NEGATIVE
Protein, ur: NEGATIVE mg/dL
Specific Gravity, Urine: 1.007 (ref 1.005–1.030)
pH: 5 (ref 5.0–8.0)

## 2015-05-10 LAB — SURGICAL PCR SCREEN
MRSA, PCR: NEGATIVE
Staphylococcus aureus: NEGATIVE

## 2015-05-10 LAB — PROTIME-INR
INR: 1.19 (ref 0.00–1.49)
Prothrombin Time: 15.3 seconds — ABNORMAL HIGH (ref 11.6–15.2)

## 2015-05-10 LAB — TYPE AND SCREEN
ABO/RH(D): B POS
Antibody Screen: NEGATIVE

## 2015-05-10 LAB — APTT: aPTT: 29 seconds (ref 24–37)

## 2015-05-10 NOTE — Progress Notes (Signed)
PCP - Vicenta Aly Cardiologist - Dr. Dorris Carnes  EKG- 12/25/14 CXR - 02/20/15  Echo-denies Stress test - 06/08/2012 Cardiac Cath - denies  Patient denies chest pain and shortness of breath at PAT appointment.  Patient states he received cardiac clearance from Dr. Harrington Challenger prior to previous knee surgery in January.

## 2015-05-22 MED ORDER — DEXTROSE-NACL 5-0.45 % IV SOLN: INTRAVENOUS | Status: DC

## 2015-05-22 MED ORDER — CEFAZOLIN SODIUM-DEXTROSE 2-4 GM/100ML-% IV SOLN
2.0000 g | INTRAVENOUS | Status: AC
  Administered 2015-05-23: 2 g via INTRAVENOUS
  Filled 2015-05-22: qty 100

## 2015-05-22 NOTE — H&P (Signed)
TOTAL KNEE ADMISSION H&P  Patient is being admitted for right total knee arthroplasty.  Subjective:  Chief Complaint:right knee pain.  HPI: Kerry Adams, 60 y.o. male, has a history of pain and functional disability in the right knee due to trauma and arthritis and has failed non-surgical conservative treatments for greater than 12 weeks to includeNSAID's and/or analgesics, corticosteriod injections, viscosupplementation injections, flexibility and strengthening excercises and activity modification.  Onset of symptoms was gradual, starting several years ago with gradually worsening course since that time. The patient noted prior procedures on the knee to include  intermedualary rod for hip fracture on the right knee(s).  Patient currently rates pain in the right knee(s) at 10 out of 10 with activity. Patient has night pain, worsening of pain with activity and weight bearing, pain that interferes with activities of daily living, pain with passive range of motion and crepitus.  Patient has evidence of subchondral sclerosis, periarticular osteophytes and joint space narrowing by imaging studies.   There is no active infection.  Patient Active Problem List   Diagnosis Date Noted  . Osteoarthritis of knee, unilateral 03/01/2015  . Hip fracture requiring operative repair ([place]) 02/19/2014  . Closed right hip fracture ([place]) 02/18/2014  . Hypertension 02/18/2014  . Chronic liver disease 02/18/2014  . Alcohol use ([place]) 02/18/2014   Past Medical History  Diagnosis Date  . Hypertension   . Elevated liver function tests   . Chronic liver disease   . Abnormal EKG   . Dizziness   . Arthritis     osteo knees and hips   . GERD (gastroesophageal reflux disease)     treats with OTC Ranitidine    Past Surgical History  Procedure Laterality Date  . Intertrochanteric hip fracture surgery Right 02/19/2014  . Shoulder surgery Right   . Intramedullary (im) nail intertrochanteric Right 02/19/2014   Procedure: INTRAMEDULLARY (IM) NAIL INTERTROCHANTRIC;  Surgeon: [name], MD;  Location: [facility];  Service: Orthopedics;  Laterality: Right;  . Fracture surgery      right femur  . Hernia repair      umbilical  . Total knee arthroplasty Left 03/01/2015    Procedure: TOTAL KNEE ARTHROPLASTY;  Surgeon: [name], MD;  Location: [facility];  Service: Orthopedics;  Laterality: Left;  Left knee arthroplasty    No prescriptions prior to admission   No Known Allergies  Social History  Substance Use Topics  . Smoking status: Never Smoker   . Smokeless tobacco: Never Used  . Alcohol Use: 19.2 oz/week    32 Cans of beer per week     Comment: 6 beers /day    Family History  Problem Relation Age of Onset  . Hypertension Mother   . Stroke Father   . Healthy Brother      Review of Systems  Constitutional: Negative.   HENT: Negative.   Eyes: Negative.   Respiratory: Negative.   Cardiovascular: Negative.        HTN  Gastrointestinal: Negative.   Genitourinary: Negative.   Musculoskeletal: Positive for joint pain.  Skin: Negative.   Neurological: Negative.   Endo/Heme/Allergies: Negative.   Psychiatric/Behavioral: Negative.     Objective:  Physical Exam  Constitutional: He is oriented to person, place, and time. He appears well-developed and well-nourished.  HENT:  Head: Normocephalic and atraumatic.  Eyes: Pupils are equal, round, and reactive to light.  Neck: Normal range of motion. Neck supple.  Cardiovascular: Intact distal pulses.   Respiratory: Effort normal.  Musculoskeletal:  He exhibits tenderness.  Neurological: He is alert and oriented to person, place, and time.  Skin: Skin is warm and dry.  Psychiatric: He has a normal mood and affect. His behavior is normal. Judgment and thought content normal.    Vital signs in last 24 hours:    Labs:   Estimated body mass index is 30.29 kg/(m^2) as calculated from the following:   Height as of 02/20/15: 6\' 1"   (1.854 m).   Weight as of 02/20/15: 104.101 kg (229 lb 8 oz).   Imaging Review Plain radiographs demonstrate AP and lateral views of the right femur are taken and reviewed in office today.  Patient's intramedullary nail appears to be well-placed and well fixed.  Patient's knee does have severe end-stage osteoarthritis of the medial compartment.   Assessment/Plan:  End-stage arthritis right knee complicated by previous hip fracture with intramedullary rod.  The patient history, physical examination, clinical judgment of the provider and imaging studies are consistent with end stage degenerative joint disease of the right knee(s) and total knee arthroplasty is deemed medically necessary. The treatment options including medical management, injection therapy arthroscopy and arthroplasty were discussed at length. The risks and benefits of total knee arthroplasty were presented and reviewed. The risks due to aseptic loosening, infection, stiffness, patella tracking problems, thromboembolic complications and other imponderables were discussed. The patient acknowledged the explanation, agreed to proceed with the plan and consent was signed. Patient is being admitted for inpatient treatment for surgery, pain control, PT, OT, prophylactic antibiotics, VTE prophylaxis, progressive ambulation and ADL's and discharge planning. The patient is planning to be discharged home with home health services

## 2015-05-23 ENCOUNTER — Encounter (HOSPITAL_COMMUNITY): Payer: Self-pay | Admitting: Surgery

## 2015-05-23 ENCOUNTER — Inpatient Hospital Stay (HOSPITAL_COMMUNITY): Payer: BLUE CROSS/BLUE SHIELD | Admitting: Emergency Medicine

## 2015-05-23 ENCOUNTER — Inpatient Hospital Stay (HOSPITAL_COMMUNITY)
Admission: AD | Admit: 2015-05-23 | Discharge: 2015-05-24 | DRG: 470 | Disposition: A | Discharge status: Home Health | Payer: BLUE CROSS/BLUE SHIELD | Source: Ambulatory Visit | Attending: Orthopedic Surgery | Admitting: Orthopedic Surgery

## 2015-05-23 ENCOUNTER — Encounter (HOSPITAL_COMMUNITY)
Admission: AD | Disposition: A | Discharge status: Home Health | Payer: Self-pay | Source: Ambulatory Visit | Attending: Orthopedic Surgery

## 2015-05-23 ENCOUNTER — Inpatient Hospital Stay (HOSPITAL_COMMUNITY): Payer: BLUE CROSS/BLUE SHIELD | Admitting: Anesthesiology

## 2015-05-23 DIAGNOSIS — K219 Gastro-esophageal reflux disease without esophagitis: Secondary | ICD-10-CM | POA: Diagnosis present

## 2015-05-23 DIAGNOSIS — M1711 Unilateral primary osteoarthritis, right knee: Principal | ICD-10-CM | POA: Diagnosis present

## 2015-05-23 DIAGNOSIS — Z8249 Family history of ischemic heart disease and other diseases of the circulatory system: Secondary | ICD-10-CM | POA: Diagnosis not present

## 2015-05-23 DIAGNOSIS — D62 Acute posthemorrhagic anemia: Secondary | ICD-10-CM | POA: Diagnosis not present

## 2015-05-23 DIAGNOSIS — I10 Essential (primary) hypertension: Secondary | ICD-10-CM | POA: Diagnosis present

## 2015-05-23 DIAGNOSIS — K769 Liver disease, unspecified: Secondary | ICD-10-CM | POA: Diagnosis present

## 2015-05-23 DIAGNOSIS — Z8781 Personal history of (healed) traumatic fracture: Secondary | ICD-10-CM

## 2015-05-23 DIAGNOSIS — Z96652 Presence of left artificial knee joint: Secondary | ICD-10-CM | POA: Diagnosis present

## 2015-05-23 DIAGNOSIS — M25561 Pain in right knee: Secondary | ICD-10-CM | POA: Diagnosis present

## 2015-05-23 HISTORY — DX: Unilateral primary osteoarthritis, right knee: M17.11

## 2015-05-23 HISTORY — PX: TOTAL KNEE ARTHROPLASTY: SHX125

## 2015-05-23 SURGERY — ARTHROPLASTY, KNEE, TOTAL
Anesthesia: Spinal | Laterality: Right

## 2015-05-23 MED ORDER — ACETAMINOPHEN 650 MG RE SUPP: 650.0000 mg | Freq: Four times a day (QID) | RECTAL | Status: DC | PRN

## 2015-05-23 MED ORDER — METHOCARBAMOL 500 MG PO TABS
500.0000 mg | ORAL_TABLET | Freq: Four times a day (QID) | ORAL | Status: DC | PRN
  Administered 2015-05-23 – 2015-05-24 (×3): 500 mg via ORAL
  Filled 2015-05-23 (×3): qty 1

## 2015-05-23 MED ORDER — AMLODIPINE BESY-BENAZEPRIL HCL 5-20 MG PO CAPS: 1.0000 | ORAL_CAPSULE | Freq: Every day | ORAL | Status: DC

## 2015-05-23 MED ORDER — TRANEXAMIC ACID 1000 MG/10ML IV SOLN
2000.0000 mg | INTRAVENOUS | Status: DC | PRN
  Administered 2015-05-23: 2000 mg via TOPICAL

## 2015-05-23 MED ORDER — TRANEXAMIC ACID 1000 MG/10ML IV SOLN
1000.0000 mg | INTRAVENOUS | Status: AC
  Administered 2015-05-23: 1000 mg via INTRAVENOUS
  Filled 2015-05-23: qty 10

## 2015-05-23 MED ORDER — FENTANYL CITRATE (PF) 250 MCG/5ML IJ SOLN
INTRAMUSCULAR | Status: AC
  Filled 2015-05-23: qty 5

## 2015-05-23 MED ORDER — PHENOL 1.4 % MT LIQD: 1.0000 | OROMUCOSAL | Status: DC | PRN

## 2015-05-23 MED ORDER — BUPIVACAINE HCL (PF) 0.5 % IJ SOLN
INTRAMUSCULAR | Status: AC
  Filled 2015-05-23: qty 30

## 2015-05-23 MED ORDER — ONDANSETRON HCL 4 MG/2ML IJ SOLN: 4.0000 mg | Freq: Four times a day (QID) | INTRAMUSCULAR | Status: DC | PRN

## 2015-05-23 MED ORDER — ACETAMINOPHEN 325 MG PO TABS: 650.0000 mg | ORAL_TABLET | Freq: Four times a day (QID) | ORAL | Status: DC | PRN

## 2015-05-23 MED ORDER — PROPOFOL 10 MG/ML IV BOLUS
INTRAVENOUS | Status: AC
  Filled 2015-05-23: qty 20

## 2015-05-23 MED ORDER — ONDANSETRON HCL 4 MG PO TABS: 4.0000 mg | ORAL_TABLET | Freq: Four times a day (QID) | ORAL | Status: DC | PRN

## 2015-05-23 MED ORDER — METOCLOPRAMIDE HCL 5 MG PO TABS: 5.0000 mg | ORAL_TABLET | Freq: Three times a day (TID) | ORAL | Status: DC | PRN

## 2015-05-23 MED ORDER — ONDANSETRON HCL 4 MG/2ML IJ SOLN: 4.0000 mg | Freq: Once | INTRAMUSCULAR | Status: DC | PRN

## 2015-05-23 MED ORDER — FENTANYL CITRATE (PF) 100 MCG/2ML IJ SOLN
INTRAMUSCULAR | Status: AC
  Filled 2015-05-23: qty 2

## 2015-05-23 MED ORDER — TRANEXAMIC ACID 650 MG PO TABS
1300.0000 mg | ORAL_TABLET | Freq: Once | ORAL | Status: AC
  Administered 2015-05-23: 1300 mg via ORAL
  Filled 2015-05-23: qty 2

## 2015-05-23 MED ORDER — BUPIVACAINE HCL 0.5 % IJ SOLN
INTRAMUSCULAR | Status: DC | PRN
  Administered 2015-05-23: 20 mL

## 2015-05-23 MED ORDER — SODIUM CHLORIDE 0.9 % IJ SOLN
INTRAMUSCULAR | Status: DC | PRN
  Administered 2015-05-23 (×2): 10 mL

## 2015-05-23 MED ORDER — ONDANSETRON HCL 4 MG/2ML IJ SOLN
INTRAMUSCULAR | Status: AC
  Filled 2015-05-23: qty 2

## 2015-05-23 MED ORDER — TAMSULOSIN HCL 0.4 MG PO CAPS
0.4000 mg | ORAL_CAPSULE | Freq: Every day | ORAL | Status: DC
  Administered 2015-05-23: 0.4 mg via ORAL
  Filled 2015-05-23: qty 1

## 2015-05-23 MED ORDER — PHENYLEPHRINE HCL 10 MG/ML IJ SOLN
10.0000 mg | INTRAVENOUS | Status: DC | PRN
  Administered 2015-05-23: 50 ug/min via INTRAVENOUS

## 2015-05-23 MED ORDER — LACTATED RINGERS IV SOLN
INTRAVENOUS | Status: DC | PRN
  Administered 2015-05-23 (×2): via INTRAVENOUS

## 2015-05-23 MED ORDER — METHOCARBAMOL 500 MG PO TABS: 500.0000 mg | ORAL_TABLET | Freq: Two times a day (BID) | ORAL | Status: DC

## 2015-05-23 MED ORDER — 0.9 % SODIUM CHLORIDE (POUR BTL) OPTIME
TOPICAL | Status: DC | PRN
  Administered 2015-05-23: 1000 mL

## 2015-05-23 MED ORDER — BUPIVACAINE LIPOSOME 1.3 % IJ SUSP
20.0000 mL | INTRAMUSCULAR | Status: DC
  Filled 2015-05-23: qty 20

## 2015-05-23 MED ORDER — PHENYLEPHRINE 40 MCG/ML (10ML) SYRINGE FOR IV PUSH (FOR BLOOD PRESSURE SUPPORT)
PREFILLED_SYRINGE | INTRAVENOUS | Status: AC
  Filled 2015-05-23: qty 10

## 2015-05-23 MED ORDER — FLEET ENEMA 7-19 GM/118ML RE ENEM: 1.0000 | ENEMA | Freq: Once | RECTAL | Status: DC | PRN

## 2015-05-23 MED ORDER — ASPIRIN EC 325 MG PO TBEC: 325.0000 mg | DELAYED_RELEASE_TABLET | Freq: Two times a day (BID) | ORAL | Status: DC

## 2015-05-23 MED ORDER — SENNOSIDES-DOCUSATE SODIUM 8.6-50 MG PO TABS: 1.0000 | ORAL_TABLET | Freq: Every evening | ORAL | Status: DC | PRN

## 2015-05-23 MED ORDER — KCL IN DEXTROSE-NACL 20-5-0.45 MEQ/L-%-% IV SOLN
INTRAVENOUS | Status: DC
  Filled 2015-05-23: qty 1000

## 2015-05-23 MED ORDER — MIDAZOLAM HCL 2 MG/2ML IJ SOLN
INTRAMUSCULAR | Status: AC
  Filled 2015-05-23: qty 2

## 2015-05-23 MED ORDER — PHENYLEPHRINE HCL 10 MG/ML IJ SOLN
INTRAMUSCULAR | Status: DC | PRN
  Administered 2015-05-23 (×3): 80 ug via INTRAVENOUS

## 2015-05-23 MED ORDER — ALUM & MAG HYDROXIDE-SIMETH 200-200-20 MG/5ML PO SUSP: 30.0000 mL | ORAL | Status: DC | PRN

## 2015-05-23 MED ORDER — FAMOTIDINE 20 MG PO TABS
10.0000 mg | ORAL_TABLET | Freq: Every day | ORAL | Status: DC
  Administered 2015-05-24: 10 mg via ORAL
  Filled 2015-05-23: qty 1

## 2015-05-23 MED ORDER — ATORVASTATIN CALCIUM 10 MG PO TABS
10.0000 mg | ORAL_TABLET | Freq: Every day | ORAL | Status: DC
  Administered 2015-05-23: 10 mg via ORAL
  Filled 2015-05-23: qty 1

## 2015-05-23 MED ORDER — BUPIVACAINE LIPOSOME 1.3 % IJ SUSP
INTRAMUSCULAR | Status: DC | PRN
  Administered 2015-05-23: 20 mL

## 2015-05-23 MED ORDER — LACTATED RINGERS IV SOLN
INTRAVENOUS | Status: DC
  Administered 2015-05-23: 12:00:00 via INTRAVENOUS

## 2015-05-23 MED ORDER — MENTHOL 3 MG MT LOZG: 1.0000 | LOZENGE | OROMUCOSAL | Status: DC | PRN

## 2015-05-23 MED ORDER — SODIUM CHLORIDE 0.9 % IR SOLN
Status: DC | PRN
  Administered 2015-05-23: 3000 mL

## 2015-05-23 MED ORDER — BISACODYL 5 MG PO TBEC: 5.0000 mg | DELAYED_RELEASE_TABLET | Freq: Every day | ORAL | Status: DC | PRN

## 2015-05-23 MED ORDER — HYDROMORPHONE HCL 1 MG/ML IJ SOLN
1.0000 mg | INTRAMUSCULAR | Status: DC | PRN
  Administered 2015-05-23 (×2): 1 mg via INTRAVENOUS
  Filled 2015-05-23 (×2): qty 1

## 2015-05-23 MED ORDER — TRANEXAMIC ACID 1000 MG/10ML IV SOLN
2000.0000 mg | INTRAVENOUS | Status: DC
  Filled 2015-05-23: qty 20

## 2015-05-23 MED ORDER — ASPIRIN EC 325 MG PO TBEC
325.0000 mg | DELAYED_RELEASE_TABLET | Freq: Every day | ORAL | Status: DC
  Administered 2015-05-24: 325 mg via ORAL
  Filled 2015-05-23: qty 1

## 2015-05-23 MED ORDER — DOCUSATE SODIUM 100 MG PO CAPS
100.0000 mg | ORAL_CAPSULE | Freq: Two times a day (BID) | ORAL | Status: DC
  Administered 2015-05-23 – 2015-05-24 (×2): 100 mg via ORAL
  Filled 2015-05-23 (×2): qty 1

## 2015-05-23 MED ORDER — DIPHENHYDRAMINE HCL 12.5 MG/5ML PO ELIX: 12.5000 mg | ORAL_SOLUTION | ORAL | Status: DC | PRN

## 2015-05-23 MED ORDER — BENAZEPRIL HCL 20 MG PO TABS
20.0000 mg | ORAL_TABLET | Freq: Every day | ORAL | Status: DC
  Administered 2015-05-23 – 2015-05-24 (×2): 20 mg via ORAL
  Filled 2015-05-23 (×2): qty 1

## 2015-05-23 MED ORDER — METHOCARBAMOL 1000 MG/10ML IJ SOLN
500.0000 mg | Freq: Four times a day (QID) | INTRAVENOUS | Status: DC | PRN
  Filled 2015-05-23: qty 5

## 2015-05-23 MED ORDER — CEFUROXIME SODIUM 1.5 G IJ SOLR
INTRAMUSCULAR | Status: AC
  Filled 2015-05-23: qty 1.5

## 2015-05-23 MED ORDER — FENTANYL CITRATE (PF) 100 MCG/2ML IJ SOLN
25.0000 ug | INTRAMUSCULAR | Status: DC | PRN
  Administered 2015-05-23: 25 ug via INTRAVENOUS

## 2015-05-23 MED ORDER — CHLORHEXIDINE GLUCONATE 4 % EX LIQD: 60.0000 mL | Freq: Once | CUTANEOUS | Status: DC

## 2015-05-23 MED ORDER — AMLODIPINE BESYLATE 5 MG PO TABS
5.0000 mg | ORAL_TABLET | Freq: Every day | ORAL | Status: DC
  Administered 2015-05-23 – 2015-05-24 (×2): 5 mg via ORAL
  Filled 2015-05-23 (×2): qty 1

## 2015-05-23 MED ORDER — OXYCODONE HCL 5 MG PO TABS
5.0000 mg | ORAL_TABLET | ORAL | Status: DC | PRN
  Administered 2015-05-23 – 2015-05-24 (×7): 10 mg via ORAL
  Filled 2015-05-23 (×7): qty 2

## 2015-05-23 MED ORDER — PROPOFOL 500 MG/50ML IV EMUL
INTRAVENOUS | Status: DC | PRN
  Administered 2015-05-23: 75 ug/kg/min via INTRAVENOUS

## 2015-05-23 MED ORDER — OXYCODONE-ACETAMINOPHEN 5-325 MG PO TABS: 1.0000 | ORAL_TABLET | ORAL | Status: DC | PRN

## 2015-05-23 MED ORDER — FENTANYL CITRATE (PF) 100 MCG/2ML IJ SOLN
INTRAMUSCULAR | Status: DC | PRN
  Administered 2015-05-23 (×5): 50 ug via INTRAVENOUS

## 2015-05-23 MED ORDER — ONDANSETRON HCL 4 MG/2ML IJ SOLN
INTRAMUSCULAR | Status: DC | PRN
  Administered 2015-05-23: 4 mg via INTRAVENOUS

## 2015-05-23 MED ORDER — MIDAZOLAM HCL 5 MG/5ML IJ SOLN
INTRAMUSCULAR | Status: DC | PRN
  Administered 2015-05-23 (×2): 2 mg via INTRAVENOUS

## 2015-05-23 MED ORDER — METOCLOPRAMIDE HCL 5 MG/ML IJ SOLN: 5.0000 mg | Freq: Three times a day (TID) | INTRAMUSCULAR | Status: DC | PRN

## 2015-05-23 SURGICAL SUPPLY — 53 items
BANDAGE ACE 6X5 VEL STRL LF (GAUZE/BANDAGES/DRESSINGS) ×3 IMPLANT
BANDAGE ESMARK 6X9 LF (GAUZE/BANDAGES/DRESSINGS) ×1 IMPLANT
BLADE SAG 18X100X1.27 (BLADE) ×3 IMPLANT
BLADE SAW SGTL 13X75X1.27 (BLADE) ×3 IMPLANT
BLADE SURG ROTATE 9660 (MISCELLANEOUS) IMPLANT
BNDG ELASTIC 6X10 VLCR STRL LF (GAUZE/BANDAGES/DRESSINGS) ×3 IMPLANT
BNDG ESMARK 6X9 LF (GAUZE/BANDAGES/DRESSINGS) ×3
BOWL SMART MIX CTS (DISPOSABLE) ×3 IMPLANT
CAPT KNEE TOTAL 3 ATTUNE ×3 IMPLANT
CEMENT HV SMART SET (Cement) ×6 IMPLANT
COVER SURGICAL LIGHT HANDLE (MISCELLANEOUS) ×3 IMPLANT
CUFF TOURNIQUET SINGLE 34IN LL (TOURNIQUET CUFF) ×3 IMPLANT
CUFF TOURNIQUET SINGLE 44IN (TOURNIQUET CUFF) IMPLANT
DRAPE EXTREMITY T 121X128X90 (DRAPE) ×3 IMPLANT
DRAPE U-SHAPE 47X51 STRL (DRAPES) ×3 IMPLANT
DRSG AQUACEL AG ADV 3.5X10 (GAUZE/BANDAGES/DRESSINGS) ×3 IMPLANT
DURAPREP 26ML APPLICATOR (WOUND CARE) ×6 IMPLANT
ELECT REM PT RETURN 9FT ADLT (ELECTROSURGICAL) ×3
ELECTRODE REM PT RTRN 9FT ADLT (ELECTROSURGICAL) ×1 IMPLANT
EVACUATOR 1/8 PVC DRAIN (DRAIN) IMPLANT
GLOVE BIO SURGEON STRL SZ7.5 (GLOVE) ×3 IMPLANT
GLOVE BIO SURGEON STRL SZ8.5 (GLOVE) ×3 IMPLANT
GLOVE BIOGEL PI IND STRL 8 (GLOVE) ×1 IMPLANT
GLOVE BIOGEL PI IND STRL 9 (GLOVE) ×1 IMPLANT
GLOVE BIOGEL PI INDICATOR 8 (GLOVE) ×2
GLOVE BIOGEL PI INDICATOR 9 (GLOVE) ×2
GOWN STRL REUS W/ TWL LRG LVL3 (GOWN DISPOSABLE) ×1 IMPLANT
GOWN STRL REUS W/ TWL XL LVL3 (GOWN DISPOSABLE) ×2 IMPLANT
GOWN STRL REUS W/TWL LRG LVL3 (GOWN DISPOSABLE) ×2
GOWN STRL REUS W/TWL XL LVL3 (GOWN DISPOSABLE) ×4
HANDPIECE INTERPULSE COAX TIP (DISPOSABLE) ×2
HOOD PEEL AWAY FACE SHEILD DIS (HOOD) ×6 IMPLANT
KIT BASIN OR (CUSTOM PROCEDURE TRAY) ×3 IMPLANT
KIT ROOM TURNOVER OR (KITS) ×3 IMPLANT
MANIFOLD NEPTUNE II (INSTRUMENTS) ×3 IMPLANT
NEEDLE SPNL 18GX3.5 QUINCKE PK (NEEDLE) IMPLANT
NS IRRIG 1000ML POUR BTL (IV SOLUTION) ×3 IMPLANT
PACK TOTAL JOINT (CUSTOM PROCEDURE TRAY) ×3 IMPLANT
PAD ARMBOARD 7.5X6 YLW CONV (MISCELLANEOUS) ×6 IMPLANT
SET HNDPC FAN SPRY TIP SCT (DISPOSABLE) ×1 IMPLANT
SUT VIC AB 0 CT1 27 (SUTURE) ×2
SUT VIC AB 0 CT1 27XBRD ANBCTR (SUTURE) ×1 IMPLANT
SUT VIC AB 1 CTX 36 (SUTURE) ×2
SUT VIC AB 1 CTX36XBRD ANBCTR (SUTURE) ×1 IMPLANT
SUT VIC AB 2-0 CT1 27 (SUTURE)
SUT VIC AB 2-0 CT1 TAPERPNT 27 (SUTURE) IMPLANT
SUT VIC AB 3-0 CT1 27 (SUTURE) ×2
SUT VIC AB 3-0 CT1 TAPERPNT 27 (SUTURE) ×1 IMPLANT
SYR 50ML LL SCALE MARK (SYRINGE) ×3 IMPLANT
TOWEL OR 17X24 6PK STRL BLUE (TOWEL DISPOSABLE) ×3 IMPLANT
TOWEL OR 17X26 10 PK STRL BLUE (TOWEL DISPOSABLE) ×3 IMPLANT
TRAY CATH 16FR W/PLASTIC CATH (SET/KITS/TRAYS/PACK) IMPLANT
WATER STERILE IRR 1000ML POUR (IV SOLUTION) ×9 IMPLANT

## 2015-05-23 NOTE — Anesthesia Preprocedure Evaluation (Addendum)
Anesthesia Evaluation  Patient identified by MRN, date of birth, ID band Patient awake    Reviewed: Allergy & Precautions, NPO status , Patient's Chart, lab work & pertinent test results  History of Anesthesia Complications Negative for: history of anesthetic complications  Airway Mallampati: II  TM Distance: >3 FB Neck ROM: Full    Dental no notable dental hx. (+) Dental Advisory Given   Pulmonary neg pulmonary ROS,    Pulmonary exam normal breath sounds clear to auscultation       Cardiovascular hypertension, Pt. on medications and Pt. on home beta blockers Normal cardiovascular exam Rhythm:Regular Rate:Normal     Neuro/Psych negative neurological ROS  negative psych ROS   GI/Hepatic Neg liver ROS, GERD  Medicated and Controlled,  Endo/Other  negative endocrine ROS  Renal/GU negative Renal ROS  negative genitourinary   Musculoskeletal  (+) Arthritis ,   Abdominal   Peds negative pediatric ROS (+)  Hematology negative hematology ROS (+)   Anesthesia Other Findings   Reproductive/Obstetrics negative OB ROS                            Anesthesia Physical Anesthesia Plan  ASA: II  Anesthesia Plan: Spinal   Post-op Pain Management:    Induction: Intravenous  Airway Management Planned:   Additional Equipment:   Intra-op Plan:   Post-operative Plan:   Informed Consent: I have reviewed the patients History and Physical, chart, labs and discussed the procedure including the risks, benefits and alternatives for the proposed anesthesia with the patient or authorized representative who has indicated his/her understanding and acceptance.   Dental advisory given  Plan Discussed with: CRNA  Anesthesia Plan Comments:         Anesthesia Quick Evaluation

## 2015-05-23 NOTE — Discharge Instructions (Signed)

## 2015-05-23 NOTE — Transfer of Care (Signed)
Immediate Anesthesia Transfer of Care Note  Patient: Kerry Adams  Procedure(s) Performed: Procedure(s): TOTAL KNEE ARTHROPLASTY W/HARDWARE REMOVAL FEMUR (Right)  Patient Location: PACU  Anesthesia Type:Spinal  Level of Consciousness: awake  Airway & Oxygen Therapy: Patient Spontanous Breathing and Patient connected to nasal cannula oxygen  Post-op Assessment: Report given to RN and Post -op Vital signs reviewed and stable  Post vital signs: Reviewed and stable  Last Vitals:  Filed Vitals:   05/23/15 1101  BP: 174/100  Pulse: 78  Temp: 36.8 C  Resp: 20    Complications: No apparent anesthesia complications

## 2015-05-23 NOTE — Interval H&P Note (Signed)
History and Physical Interval Note:  05/23/2015 12:47 PM  Kerry Adams  has presented today for surgery, with the diagnosis of RIGHT KNEE OSTEOARTHRITIS WITH INTACT HARDWARE FEMORAL ROD  The various methods of treatment have been discussed with the patient and family. After consideration of risks, benefits and other options for treatment, the patient has consented to  Procedure(s): TOTAL KNEE ARTHROPLASTY W/HARDWARE REMOVAL FEMUR (Right) as a surgical intervention .  The patient's history has been reviewed, patient examined, no change in status, stable for surgery.  I have reviewed the patient's chart and labs.  Questions were answered to the patient's satisfaction.     Kerin Salen

## 2015-05-23 NOTE — Op Note (Signed)
PATIENT ID:      Kerry Adams  MRN:     [id number] DOB/AGE:    Jul 13, 1955 / 60 y.o.       OPERATIVE REPORT    DATE OF PROCEDURE:  05/23/2015       PREOPERATIVE DIAGNOSIS:   RIGHT KNEE OSTEOARTHRITIS RETAINED FEMORAL ROD      Estimated body mass index is 29.53 kg/(m^2) as calculated from the following:   Height as of this encounter: 6\' 1"  (1.854 m).   Weight as of this encounter: 101.521 kg (223 lb 13 oz).                                                        POSTOPERATIVE DIAGNOSIS:   RIGHT KNEE OSTEOARTHRITIS WITH INTACT HARDWARE                                                                       PROCEDURE:  Procedure(s): TOTAL KNEE ARTHROPLASTY Using DepuyAttune RP implants #8R Femur, #9Tibia, 5 mm Attune RP bearing, 41 Patella     SURGEON: Sven Pinheiro J    ASSISTANT:   [employer] K[employer]   (Present and scrubbed throughout the case, critical for assistance with exposure, retraction, instrumentation, and closure.)         ANESTHESIA: Spinal, 20cc Exparel, 20cc 0.5% Marcaine  EBL: 400  FLUID REPLACEMENT: 1600 crystalloid  TOURNIQUET TIME: 33min  Drains: None  Tranexamic Acid: 1gm iv 2gm topical   COMPLICATIONS:  None         INDICATIONS FOR PROCEDURE: The patient has  RIGHT KNEE OSTEOARTHRITIS WITH INTACT HARDWARE FEMORAL ROD, Var deformities, XR shows bone on bone arthritis, lateral subluxation of tibia, and trochanteric nail in the right side that ends 5 cm above the femoral notch. Careful measurement shows that placement of a standard femoral component should be possible however the patient has consented to removal of the femoral rod if it is a way of placing the femoral component.. Patient has failed all conservative measures including anti-inflammatory medicines, narcotics, attempts at  exercise and weight loss, cortisone injections and viscosupplementation.  Risks and benefits of surgery have been discussed, questions answered.   DESCRIPTION OF PROCEDURE: The patient  identified by armband, received  IV antibiotics, in the holding area at [facility]. Patient taken to the operating room, appropriate anesthetic  monitors were attached, and Spinal anesthesia was  induced. Tourniquet  applied high to the operative thigh. Lateral post and foot positioner  applied to the table, the lower extremity was then prepped and draped  in usual sterile fashion from the toes to the tourniquet. Time-out procedure was performed. We began the operation, with the knee flexed 120 degrees, by making the anterior midline incision starting at handbreadth above the patella going over the patella 1 cm medial to and 4 cm distal to the tibial tubercle. Small bleeders in the skin and the  subcutaneous tissue identified and cauterized. Transverse retinaculum was incised and reflected medially and a medial parapatellar arthrotomy was accomplished. the patella was everted and theprepatellar fat pad  resected. The superficial medial collateral  ligament was then elevated from anterior to posterior along the proximal  flare of the tibia and anterior half of the menisci resected. The knee was hyperflexed exposing bone on bone arthritis. Peripheral and notch osteophytes as well as the cruciate ligaments were then resected. We continued to  work our way around posteriorly along the proximal tibia, and externally  rotated the tibia subluxing it out from underneath the femur. A McHale  retractor was placed through the notch and a lateral Hohmann retractor  placed, and we then drilled through the proximal tibia in line with the  axis of the tibia followed by an intramedullary guide rod and 2-degree  posterior slope cutting guide. The tibial cutting guide, 3 degree posterior sloped, was pinned into place allowing resection of 0 mm of bone medially and 13 mm of bone laterally. Satisfied with the tibial resection, we then cut a small notch in the center of the trochlea so that it was flat with the  anterior surface of the femur. We then took the femoral guide rod and related on the anterior portion of the femur going from mid femur distally to the notch and the anterior surface and holding the proximal aspect of the rod centered with manual pressure. We then applied the distal femoral cutting guide  set at 9 mm, with 5 degrees of valgus. This was pinned along the  epicondylar axis. At this point, the distal femoral cut was accomplished without difficulty. We then sized for a #8 right femoral component and pinned the guide in 3 degrees of external rotation. The chamfer cutting guide was pinned into place. The anterior, posterior, and chamfer cuts were accomplished without difficulty followed by  the Attune RP box cutting guide and the box cut. We also removed posterior osteophytes from the posterior femoral condyles. At this  time, the knee was brought into full extension. We checked our  extension and flexion gaps and found them symmetric for a 10 mm bearing. Distracting in extension with a lamina spreader, the posterior horns of the menisci were removed, and Exparel, diluted to 60 cc, with 20cc NS, and 20cc 0.5% Marcaine,was injected into the capsule and synovium of the knee. The posterior patella cut was accomplished with the 9.5 mm Attune cutting guide, sized for a 27mm dome, and the fixation pegs drilled.The knee  was then once again hyperflexed exposing the proximal tibia. We sized for a # 9 tibial base plate, applied the smokestack and the conical reamer followed by the the Delta fin keel punch. We then hammered into place the Attune RP trial femoral component, drilled the lugs, inserted a  10 mm trial bearing, trial patellar button, and took the knee through range of motion from 0-130 degrees. No thumb pressure was required for patellar Tracking. At this point, the limb was wrapped with an Esmarch bandage and the tourniquet inflated to 350 mmHg. All trial components were removed, mating surfaces  irrigated with pulse lavage, and dried with suction and sponges. A double batch of DePuy HV cement with 1500 mg of Zinacef was mixed and applied to all bony metallic mating surfaces except for the posterior condyles of the femur itself. In order, we  hammered into place the tibial tray and removed excess cement, the femoral component and removed excess cement. The final Attune RP bearing  was inserted, and the knee brought to full extension with compression.  The patellar button was clamped into place, and excess cement  removed.  While the cement cured the wound was irrigated out with normal saline solution pulse lavage. Ligament stability and patellar tracking were checked and found to be excellent. The parapatellar arthrotomy was closed with  running #1 Vicryl suture. The subcutaneous tissue with 0 and 2-0 undyed  Vicryl suture, and the skin with running 3-0 SQ vicryl. A dressing of Xeroform,  4 x 4, dressing sponges, Webril, and Ace wrap applied. The patient  awakened, and taken to recovery room without difficulty.   Frederik Pear J 05/23/2015, 2:58 PM

## 2015-05-23 NOTE — Progress Notes (Signed)
Orthopedic Tech Progress Note Patient Details:  Kerry Adams [birth year] [id number]  CPM Right Knee CPM Right Knee: On Right Knee Flexion (Degrees): 40 Right Knee Extension (Degrees): 0  Ortho Devices Ortho Device/Splint Location: applied ohf to bed Ortho Device/Splint Interventions: Ordered, Application   Braulio Bosch 05/23/2015, 3:44 PM

## 2015-05-23 NOTE — Anesthesia Postprocedure Evaluation (Signed)
Anesthesia Post Note  Patient: Kerry Adams  Procedure(s) Performed: Procedure(s) (LRB): TOTAL KNEE ARTHROPLASTY W/HARDWARE REMOVAL FEMUR (Right)  Patient location during evaluation: PACU Anesthesia Type: General Level of consciousness: awake Pain management: pain level controlled Vital Signs Assessment: post-procedure vital signs reviewed and stable Respiratory status: spontaneous breathing Cardiovascular status: stable Anesthetic complications: no    Last Vitals:  Filed Vitals:   05/23/15 1530 05/23/15 1545  BP: 109/75 116/74  Pulse: 71 67  Temp:    Resp: 18 22    Last Pain:  Filed Vitals:   05/23/15 1547  PainSc: 0-No pain                 EDWARDS,Anasofia Micallef

## 2015-05-23 NOTE — Progress Notes (Signed)
Orthopedic Tech Progress Note Patient Details:  Kerry Adams 12-27-1955 [id number] Ortho visit adjusted cpm Patient ID: Kerry Adams, male   DOB: [birth year], 60 y.o.   MRN: [id number]   Kerry Adams 05/23/2015, 6:04 PM

## 2015-05-23 NOTE — Anesthesia Procedure Notes (Signed)
Spinal Patient location during procedure: OR Staffing Anesthesiologist: Dorreen Valiente Performed by: anesthesiologist  Preanesthetic Checklist Completed: patient identified, site marked, surgical consent, pre-op evaluation, timeout performed, IV checked, risks and benefits discussed and monitors and equipment checked Spinal Block Patient position: sitting Prep: DuraPrep Patient monitoring: continuous pulse ox, blood pressure and heart rate Approach: midline Location: L3-4 Injection technique: single-shot Needle Needle type: Pencan  Needle gauge: 24 G Needle length: 9 cm Additional Notes Functioning IV was confirmed and monitors were applied. Sterile prep and drape, including hand hygiene and sterile gloves were used. The patient was positioned and the spine was prepped. The skin was anesthetized with lidocaine.  Free flow of clear CSF was obtained prior to injecting local anesthetic into the CSF.  The spinal needle aspirated freely following injection.  The needle was carefully withdrawn.  The patient tolerated the procedure well.  Lauretta Grill, MD

## 2015-05-24 ENCOUNTER — Encounter (HOSPITAL_COMMUNITY): Payer: Self-pay | Admitting: Orthopedic Surgery

## 2015-05-24 LAB — CBC
HCT: 35.7 % — ABNORMAL LOW (ref 39.0–52.0)
Hemoglobin: 11.6 g/dL — ABNORMAL LOW (ref 13.0–17.0)
MCH: 31.2 pg (ref 26.0–34.0)
MCHC: 32.5 g/dL (ref 30.0–36.0)
MCV: 96 fL (ref 78.0–100.0)
Platelets: 133 10*3/uL — ABNORMAL LOW (ref 150–400)
RBC: 3.72 MIL/uL — ABNORMAL LOW (ref 4.22–5.81)
RDW: 14.2 % (ref 11.5–15.5)
WBC: 7.5 10*3/uL (ref 4.0–10.5)

## 2015-05-24 LAB — BASIC METABOLIC PANEL
Anion gap: 7 (ref 5–15)
BUN: 12 mg/dL (ref 6–20)
CO2: 26 mmol/L (ref 22–32)
Calcium: 8.8 mg/dL — ABNORMAL LOW (ref 8.9–10.3)
Chloride: 102 mmol/L (ref 101–111)
Creatinine, Ser: 0.8 mg/dL (ref 0.61–1.24)
GFR calc Af Amer: >60 mL/min (ref >60)
GFR calc non Af Amer: >60 mL/min (ref >60)
Glucose, Bld: 153 mg/dL — ABNORMAL HIGH (ref 65–99)
Potassium: 4.5 mmol/L (ref 3.5–5.1)
Sodium: 135 mmol/L (ref 135–145)

## 2015-05-24 NOTE — Care Management Note (Signed)
Case Management Note  Patient Details  Name: DRAXTON PARROW MRN: [id number] Date of Birth: 12-19-1955  Subjective/Objective:             S/p right total knee arthroplasty       Action/Plan: Set up with [name] for HHPT by MD office. Spoke with patient and his wife, no change in discharge plan. [employer] will be delivering CPM to patient's home, he as rolling walker and 3N1 from previous surgery. Patient's wife will be assisting him after discharge.  Expected Discharge Date:                  Expected Discharge Plan:  [facility]  In-House Referral:  NA  Discharge planning Services  CM Consult  Post Acute Care Choice:  Durable Medical Equipment, Home Health Choice offered to:  Patient  DME Arranged:  CPM DME Agency:  [employer]  HH Arranged:  PT HH Agency:  [facility]  Status of Service:  Completed, signed off  Medicare Important Message Given:    Date Medicare IM Given:    Medicare IM give by:    Date Additional Medicare IM Given:    Additional Medicare Important Message give by:     If discussed at [place] of Stay Meetings, dates discussed:    Additional Comments:  Nila Nephew, RN 05/24/2015, 1:48 PM

## 2015-05-24 NOTE — Progress Notes (Signed)
Utilization review completed.  

## 2015-05-24 NOTE — Discharge Summary (Signed)
Patient ID: OSIAS FISCH MRN: [id number] DOB/AGE: 1956/01/29 60 y.o.  Admit date: 05/23/2015 Discharge date: 05/24/2015  Admission Diagnoses:  Active Problems:   Primary osteoarthritis of right knee   Discharge Diagnoses:  Same  Past Medical History  Diagnosis Date  . Hypertension   . Elevated liver function tests   . Chronic liver disease   . Abnormal EKG   . Dizziness   . Arthritis     osteo knees and hips   . GERD (gastroesophageal reflux disease)     treats with OTC Ranitidine    Surgeries: Procedure(s): TOTAL KNEE ARTHROPLASTY W/HARDWARE REMOVAL FEMUR on 05/23/2015   Consultants:    Discharged Condition: Improved  Hospital Course: KEMAR CILIBERTI is an 60 y.o. male who was admitted 05/23/2015 for operative treatment of<principal problem not specified>. Patient has severe unremitting pain that affects sleep, daily activities, and work/hobbies. After pre-op clearance the patient was taken to the operating room on 05/23/2015 and underwent  Procedure(s): TOTAL KNEE ARTHROPLASTY W/HARDWARE REMOVAL FEMUR.    Patient was given perioperative antibiotics: Anti-infectives    Start     Dose/Rate Route Frequency Ordered Stop   05/23/15 1230  ceFAZolin (ANCEF) IVPB 2g/100 mL premix     2 g 200 mL/hr over 30 Minutes Intravenous To ShortStay Surgical 05/22/15 1057 05/23/15 1257       Patient was given sequential compression devices, early ambulation, and chemoprophylaxis to prevent DVT.  Patient benefited maximally from hospital stay and there were no complications.    Recent vital signs: Patient Vitals for the past 24 hrs:  BP Temp Pulse Resp SpO2  05/24/15 1126 120/74 mmHg - - - -  05/24/15 0300 130/78 mmHg 97.6 F (36.4 C) 78 18 95 %  05/23/15 1731 127/82 mmHg 97.7 F (36.5 C) 74 18 95 %  05/23/15 1700 127/84 mmHg - 65 (!) 21 99 %  05/23/15 1630 (!) 135/92 mmHg 98 F (36.7 C) 76 16 100 %  05/23/15 1615 118/80 mmHg - 64 20 97 %  05/23/15 1600 116/80 mmHg - 65 (!)  22 97 %  05/23/15 1545 116/74 mmHg - 67 (!) 22 94 %  05/23/15 1530 109/75 mmHg - 71 18 94 %  05/23/15 1513 100/72 mmHg 97.8 F (36.6 C) 75 18 96 %     Recent laboratory studies:  Recent Labs  05/24/15 0325  WBC 7.5  HGB 11.6*  HCT 35.7*  PLT 133*  NA 135  K 4.5  CL 102  CO2 26  BUN 12  CREATININE 0.80  GLUCOSE 153*  CALCIUM 8.8*     Discharge Medications:     Medication List    STOP taking these medications        acetaminophen 500 MG tablet  Commonly known as:  TYLENOL      TAKE these medications        ACID REDUCER PO  Take 1 tablet by mouth daily.     amLODipine-benazepril 5-20 MG capsule  Commonly known as:  LOTREL  Take 1 capsule by mouth daily.     aspirin EC 325 MG tablet  Take 1 tablet (325 mg total) by mouth 2 (two) times daily.     atorvastatin 20 MG tablet  Commonly known as:  LIPITOR  Take 10 mg by mouth daily.     docusate sodium 100 MG capsule  Commonly known as:  COLACE  Take 1 capsule (100 mg total) by mouth 3 (three) times daily as needed.  methocarbamol 500 MG tablet  Commonly known as:  ROBAXIN  Take 1 tablet (500 mg total) by mouth 2 (two) times daily with a meal.     oxyCODONE-acetaminophen 5-325 MG tablet  Commonly known as:  ROXICET  Take 1 tablet by mouth every 4 (four) hours as needed.        Diagnostic Studies: No results found.  Disposition: 06-Home-Health Care Svc      Discharge Instructions    CPM    Complete by:  As directed   Continuous passive motion machine (CPM):      Use the CPM from 0 to 60  for 5 hours per day.      You may increase by 10 degrees per day.  You may break it up into 2 or 3 sessions per day.      Use CPM for 2 weeks or until you are told to stop.     Call MD / Call 911    Complete by:  As directed   If you experience chest pain or shortness of breath, CALL 911 and be transported to the hospital emergency room.  If you develope a fever above 101 F, pus (white drainage) or increased  drainage or redness at the wound, or calf pain, call your surgeon's office.     Constipation Prevention    Complete by:  As directed   Drink plenty of fluids.  Prune juice may be helpful.  You may use a stool softener, such as Colace (over the counter) 100 mg twice a day.  Use MiraLax (over the counter) for constipation as needed.     Diet - low sodium heart healthy    Complete by:  As directed      Driving restrictions    Complete by:  As directed   No driving for 2 weeks     Increase activity slowly as tolerated    Complete by:  As directed      Patient may shower    Complete by:  As directed   You may shower without a dressing once there is no drainage.  Do not wash over the wound.  If drainage remains, cover wound with plastic wrap and then shower.           Follow-up Information    Follow up with Kerin Salen, MD In 2 weeks.   Specialty:  Orthopedic Surgery   Contact information:   [place] [address] [phone]       Follow up with [facility]   Why:  They will contact you to schedule home therapy visits.   Contact information:   [address] [place] [address] [phone]        Signed: Hardin Negus, ERIC R 05/24/2015, 2:42 PM

## 2015-05-24 NOTE — Progress Notes (Signed)
Orthopedic Tech Progress Note Patient Details:  Kerry Adams Nov 27, 1955 [id number]  Patient ID: Kerry Adams, male   DOB: Jul 24, 1955, 60 y.o.   MRN: [id number] Applied cpm 0-35. Pt. had to much pain at 39 to stay there. Karolee Stamps 05/24/2015, 6:08 AM

## 2015-05-24 NOTE — Progress Notes (Signed)
Physical Therapy Treatment Patient Details Name: Kerry Adams MRN: [id number] DOB: 21-Mar-1955 Today's Date: 05/24/2015    History of Present Illness 60 yo admitted for Rt TKA. PMHx: R hip fx, HTN, L TKA    PT Comments    Pt continues to progress with HEP, gait and transfers. Pt and wife performed stairs, education for gait deviation and correction provided, all questions answered and pt safe for D/C home.   Follow Up Recommendations  Home health PT     Equipment Recommendations       Recommendations for Other Services       Precautions / Restrictions Precautions Precautions: Knee Precaution Booklet Issued: No Precaution Comments: no objects under Restrictions Weight Bearing Restrictions: Yes RLE Weight Bearing: Weight bearing as tolerated    Mobility  Bed Mobility               General bed mobility comments: in chair on arrival  Transfers Overall transfer level: Needs assistance Equipment used: Rolling walker (2 wheeled) Transfers: Sit to/from Stand Sit to Stand: Modified independent (Device/Increase time)         General transfer comment: Supervision for safety as pt reported increased stiffness after keeping leg straight/elevated. Verbal cues for safe hand placement on seated surfaces.  Ambulation/Gait Ambulation/Gait assistance: Supervision Ambulation Distance (Feet): 300 Feet Assistive device: Rolling walker (2 wheeled) Gait Pattern/deviations: Step-through pattern;Decreased stride length   Gait velocity interpretation: Below normal speed for age/gender General Gait Details: pt with initial step to pattern, progressed to step through with bil dorsiflexion. Pt with cues to decrease ankle inversion on right   Stairs Stairs: Yes Stairs assistance: Min assist Stair Management: Backwards;With walker Number of Stairs: 2 General stair comments: x 2 trials with RW, wife present to assist and initial cues for sequence  Wheelchair Mobility     Modified Rankin (Stroke Patients Only)       Balance Overall balance assessment: Needs assistance Sitting-balance support: No upper extremity supported;Feet supported Sitting balance-Leahy Scale: Good     Standing balance support: Bilateral upper extremity supported;During functional activity Standing balance-Leahy Scale: Fair                      Cognition Arousal/Alertness: Awake/alert Behavior During Therapy: WFL for tasks assessed/performed Overall Cognitive Status: Within Functional Limits for tasks assessed                      Exercises Total Joint Exercises Heel Slides: AROM;Right;15 reps;Seated Straight Leg Raises: AROM;Right;15 reps;Seated Long Arc Quad: AROM;Seated;Right;15 reps Goniometric ROM: 9-64    General Comments        Pertinent Vitals/Pain Pain Assessment: 0-10 Pain Score: 5  Pain Location: right knee Pain Descriptors / Indicators: Aching Pain Intervention(s): Limited activity within patient's tolerance;Monitored during session;Premedicated before session;Repositioned;Ice applied    Home Living Family/patient expects to be discharged to:: Private residence Living Arrangements: Spouse/significant other Available Help at Discharge: Family;Available 24 hours/day Type of Home: House Home Access: Stairs to enter Entrance Stairs-Rails: Right;Left;Can reach both Home Layout: Two level;Able to live on main level with bedroom/bathroom Home Equipment: Bedside commode;Crutches;Walker - 2 wheels;Cane - single point;Grab bars - tub/shower;Hand held shower head;Shower seat;Toilet riser      Prior Function Level of Independence: Independent      Comments: works as a Aeronautical engineer, 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 and to be able to walk  around the entire time Progress towards PT goals: Progressing toward goals    Frequency       PT Plan Current plan remains appropriate    Co-evaluation             End of Session Equipment Utilized During Treatment: Gait belt Activity Tolerance: Patient tolerated treatment well Patient left: in chair;with call bell/phone within reach;with family/visitor present;Other (comment) (pt in bone foam)     Time: [id number] PT Time Calculation (min) (ACUTE ONLY): 27 min  Charges:  $Gait Training: 8-22 mins $Therapeutic Exercise: 8-22 mins                    G Codes:      Melford Aase June 01, 2015, 12:27 PM Elwyn Reach, [place]

## 2015-05-24 NOTE — Progress Notes (Signed)
Occupational Therapy Evaluation Patient Details Name: Kerry Adams MRN: [id number] DOB: [birth year] Today's Date: 05/24/2015    History of Present Illness 60 yo admitted for Rt TKA. PMHx: R hip fx, HTN, L TKA   Clinical Impression   PTA, pt was independent with ADLs and mobility and had a recent L TKA performed 2 months ago. Pt currently able to complete ADLs and transfers with supervision level assist.  Reviewed knee precautions, use of CPM/0 degree bone foam, gradually increasing activity level, compensatory strategies for ADLs, energy conservation, and fall prevention. Discussed proper footwear, proper body mechanics, taking breaks, making time for exercises/stretches/icing knees before/during/after work. All education has been completed and pt has no further questions. Pt with no further acute OT needs. OT signing off.    Follow Up Recommendations  No OT follow up;Supervision - Intermittent    Equipment Recommendations  None recommended by OT    Recommendations for Other Services       Precautions / Restrictions Precautions Precautions: Knee Precaution Booklet Issued: No Precaution Comments: Reviewed not placing pillow, ice pack or other object under knee Restrictions Weight Bearing Restrictions: Yes RLE Weight Bearing: Weight bearing as tolerated      Mobility Bed Mobility Overal bed mobility: Modified Independent Bed Mobility: Supine to Sit     Supine to sit: Modified independent (Device/Increase time)     General bed mobility comments: Pt up in chair on OT arrival  Transfers Overall transfer level: Needs assistance Equipment used: Rolling walker (2 wheeled) Transfers: Sit to/from Stand Sit to Stand: Supervision         General transfer comment: Supervision for safety as pt reported increased stiffness after keeping leg straight/elevated. Verbal cues for safe hand placement on seated surfaces.    Balance Overall balance assessment: Needs  assistance Sitting-balance support: No upper extremity supported;Feet supported Sitting balance-Leahy Scale: Good     Standing balance support: Bilateral upper extremity supported;During functional activity Standing balance-Leahy Scale: Fair                              ADL Overall ADL's : Needs assistance/impaired     Grooming: Wash/dry hands;Supervision/safety;Standing   Upper Body Bathing: Supervision/ safety;Sitting   Lower Body Bathing: Supervison/ safety;Sit to/from stand   Upper Body Dressing : Supervision/safety;Sitting   Lower Body Dressing: Supervision/safety;Cueing for compensatory techniques;Sit to/from stand Lower Body Dressing Details (indicate cue type and reason): Cues to dress RLE first and undress it last Toilet Transfer: Supervision/safety;RW;BSC;Ambulation;Cueing for safety Toilet Transfer Details (indicate cue type and reason): Cues to feel BSC on back of legs before reaching back to sit Toileting- Clothing Manipulation and Hygiene: Supervision/safety;Sit to/from stand   Tub/ Shower Transfer: Walk-in shower;Supervision/safety;Cueing for sequencing;Ambulation;Rolling walker Tub/Shower Transfer Details (indicate cue type and reason): Cues for proper step sequence with RW Functional mobility during ADLs: Supervision/safety;Rolling walker General ADL Comments: Reviewed knee precautions, use of CPM/0 degree bone foam, gradually increasing activity level, compensatory strategies for ADLs, energy conservation, home safety, and fall prevention. Had lengthy discussion with pt about work as pt reported that he went back to work 4 weeks after L TKA and had increased bilateral knee pain due to being on his feet all day. Discussed proper footwear, proper body mechanics, taking breaks, making time for exercises/stretches/icing knees before/during/after work.     Vision Vision Assessment?: No apparent visual deficits   Perception     Praxis      Pertinent  Vitals/Pain  Pain Assessment: 0-10 Pain Score: 8  Pain Location: R knee Pain Descriptors / Indicators: Aching Pain Intervention(s): Limited activity within patient's tolerance;Monitored during session;Repositioned;Premedicated before session     Hand Dominance Right   Extremity/Trunk Assessment Upper Extremity Assessment Upper Extremity Assessment: Overall WFL for tasks assessed   Lower Extremity Assessment Lower Extremity Assessment: RLE deficits/detail RLE Deficits / Details: decreased strength and ROm as expected post op LLE Deficits / Details: recent TKA   Cervical / Trunk Assessment Cervical / Trunk Assessment: Normal   Communication Communication Communication: No difficulties   Cognition Arousal/Alertness: Awake/alert Behavior During Therapy: WFL for tasks assessed/performed Overall Cognitive Status: Within Functional Limits for tasks assessed                     General Comments       Exercises       Shoulder Instructions      Home Living Family/patient expects to be discharged to:: Private residence Living Arrangements: Spouse/significant other Available Help at Discharge: Family;Available 24 hours/day Type of Home: House Home Access: Stairs to enter [employer] of Steps: 2 Entrance Stairs-Rails: Right;Left;Can reach both Home Layout: Two level;Able to live on main level with bedroom/bathroom     Bathroom Shower/Tub: Walk-in Hydrologist: Standard     Home Equipment: Bedside commode;Crutches;Walker - 2 wheels;Cane - single point;Grab bars - tub/shower;Hand held shower head;Shower seat;Toilet riser          Prior Functioning/Environment Level of Independence: Independent        Comments: works as a Aeronautical engineer, 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 and to be able to walk around the entire time OT Goal Formulation: With patient Time For Goal Achievement: 06/07/15 Potential to Achieve Goals: Good  OT Frequency:     Barriers to D/C:            Co-evaluation              End of Session Equipment Utilized During Treatment: Gait belt;Rolling walker CPM Right Knee CPM Right Knee: Off Nurse Communication: Mobility status  Activity Tolerance: Patient tolerated treatment well Patient left: in chair;with call bell/phone within reach;with family/visitor present   Time: 0940-1000 OT Time Calculation (min): 20 min Charges:  OT General Charges $OT Visit: 1 Procedure OT Evaluation $OT Eval Moderate Complexity: 1 Procedure G-Codes:    Redmond Baseman, OTR/L [phone] 05/24/2015, 11:42 AM

## 2015-05-24 NOTE — Evaluation (Signed)
Physical Therapy Evaluation Patient Details Name: Kerry Adams MRN: [id number] DOB: 12-30-1955 Today's Date: 05/24/2015   History of Present Illness  60 yo admitted for Rt TKA. PMHx: R hip fx, HTN, L TKA  Clinical Impression  Pt very pleasant, eager to move and familiar from Lt TKA. Pt and wife educated for knee precaution, heel roll, HEP, plan, transfers and gait. Pt with decreased strength and ROM as expected post op as well as decreased mobility and gait who will benefit from acute therapy to maximize independence, strength and function.     Follow Up Recommendations Home health PT    Equipment Recommendations  None recommended by PT    Recommendations for Other Services       Precautions / Restrictions Precautions Precautions: Knee Restrictions RLE Weight Bearing: Weight bearing as tolerated      Mobility  Bed Mobility Overal bed mobility: Modified Independent Bed Mobility: Supine to Sit     Supine to sit: Modified independent (Device/Increase time)        Transfers Overall transfer level: Needs assistance   Transfers: Sit to/from Stand Sit to Stand: Supervision         General transfer comment: cues for hand placement x 1  Ambulation/Gait Ambulation/Gait assistance: Min guard Ambulation Distance (Feet): 200 Feet Assistive device: Rolling walker (2 wheeled) Gait Pattern/deviations: Step-to pattern;Step-through pattern;Decreased dorsiflexion - left;Decreased dorsiflexion - right   Gait velocity interpretation: Below normal speed for age/gender General Gait Details: initially step to pattern with transition to step through. Cues for increased dorsiflexion bil and sequence  Stairs            Wheelchair Mobility    Modified Rankin (Stroke Patients Only)       Balance                                             Pertinent Vitals/Pain Pain Assessment: 0-10 Pain Score: 6  Pain Location: right knee Pain Descriptors /  Indicators: Throbbing;Aching Pain Intervention(s): Limited activity within patient's tolerance;Premedicated before session;Repositioned    Home Living Family/patient expects to be discharged to:: Private residence Living Arrangements: Spouse/significant other Available Help at Discharge: Family;Available 24 hours/day Type of Home: House Home Access: Stairs to enter Entrance Stairs-Rails: Right;Left;Can reach both Entrance Stairs-Number of Steps: 2 Home Layout: Two level;Able to live on main level with bedroom/bathroom Home Equipment: Crutches;Toilet riser;Walker - 2 wheels;Cane - single point;Bedside commode      Prior Function Level of Independence: Independent         Comments: works as a Aeronautical engineer, 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 due to knee pain     Hand Dominance        Extremity/Trunk Assessment   Upper Extremity Assessment: Overall WFL for tasks assessed           Lower Extremity Assessment: RLE deficits/detail;LLE deficits/detail RLE Deficits / Details: decreased strength and ROm as expected post op LLE Deficits / Details: grossly 110 degrees of flexion  Cervical / Trunk Assessment: Normal  Communication   Communication: No difficulties  Cognition Arousal/Alertness: Awake/alert Behavior During Therapy: WFL for tasks assessed/performed Overall Cognitive Status: Within Functional Limits for tasks assessed                      General Comments      Exercises Total Joint Exercises  Heel Slides: AAROM;Right;10 reps;Supine Straight Leg Raises: AROM;Right;10 reps;Supine Long Arc Quad: AROM;Right;10 reps;Seated      Assessment/Plan    PT Assessment Patient needs continued PT services  PT Diagnosis Difficulty walking;Acute pain   PT Problem List Decreased strength;Decreased range of motion;Decreased activity tolerance;Pain;Decreased mobility  PT Treatment Interventions DME instruction;Gait training;Stair  training;Functional mobility training;Therapeutic activities;Therapeutic exercise;Patient/family education   PT Goals (Current goals can be found in the Care Plan section) Acute Rehab PT Goals Patient Stated Goal: return to work PT Goal Formulation: With patient/family Time For Goal Achievement: 05/31/15 Potential to Achieve Goals: Good    Frequency 7X/week   Barriers to discharge        Co-evaluation               End of Session Equipment Utilized During Treatment: Gait belt Activity Tolerance: Patient tolerated treatment well Patient left: in chair;with call bell/phone within reach;with family/visitor present Nurse Communication: Mobility status         Time: [id number] PT Time Calculation (min) (ACUTE ONLY): 31 min   Charges:   PT Evaluation $PT Eval Moderate Complexity: 1 Procedure PT Treatments $Gait Training: 8-22 mins   PT G CodesMelford Aase 05/24/2015, 8:35 AM Elwyn Reach, [place]

## 2015-05-24 NOTE — Progress Notes (Signed)
Patient ID: Kerry Adams, male   DOB: Aug 03, 1955, 60 y.o.   MRN: [id number] PATIENT ID: Kerry Adams  MRN: [id number]  DOB/AGE:  [birth year] / 60 y.o.  1 Day Post-Op Procedure(s) (LRB): TOTAL KNEE ARTHROPLASTY W/HARDWARE REMOVAL FEMUR (Right)    PROGRESS NOTE Subjective: Patient is alert, oriented, no Nausea, no Vomiting, yes passing gas. Taking PO well. Denies SOB, Chest or Calf Pain. Using Incentive Spirometer, PAS in place. Ambulate WBAT, CPM 0-40 Patient reports pain as 4/10 .    Objective: Vital signs in last 24 hours: Filed Vitals:   05/23/15 1630 05/23/15 1700 05/23/15 1731 05/24/15 0300  BP: 135/92 127/84 127/82 130/78  Pulse: 76 65 74 78  Temp: 98 F (36.7 C)  97.7 F (36.5 C) 97.6 F (36.4 C)  TempSrc:      Resp: 16 21 18 18   Height:      Weight:      SpO2: 100% 99% 95% 95%      Intake/Output from previous day: I/O last 3 completed shifts: In: 1000 [I.V.:1000] Out: 150 [Blood:150]   Intake/Output this shift:     LABORATORY DATA:  Recent Labs  05/24/15 0325  WBC 7.5  HGB 11.6*  HCT 35.7*  PLT 133*  NA 135  K 4.5  CL 102  CO2 26  BUN 12  CREATININE 0.80  GLUCOSE 153*  CALCIUM 8.8*    Examination: Neurologically intact ABD soft Neurovascular intact Sensation intact distally Intact pulses distally Dorsiflexion/Plantar flexion intact Incision: dressing C/D/I No cellulitis present Compartment soft}  Assessment:   1 Day Post-Op Procedure(s) (LRB): TOTAL KNEE ARTHROPLASTY W/HARDWARE REMOVAL FEMUR (Right) ADDITIONAL DIAGNOSIS: Expected Acute Blood Loss Anemia, Hypertension, chronic liver disease  Plan: PT/OT WBAT, CPM 5/hrs day until ROM 0-90 degrees, then D/C CPM DVT Prophylaxis:  SCDx72hrs, ASA 325 mg BID x 2 weeks DISCHARGE PLAN: Home, today if passes PT DISCHARGE NEEDS: HHPT, CPM, Walker and 3-in-1 comode seat     Kerry Adams J 05/24/2015, 6:59 AM

## 2016-10-22 ENCOUNTER — Encounter: Payer: Self-pay | Admitting: Nurse Practitioner

## 2018-03-23 ENCOUNTER — Telehealth: Payer: Self-pay | Admitting: *Deleted

## 2018-03-23 NOTE — Telephone Encounter (Signed)
REFERRAL SENT TO SCHEDULING AND NOTES ON FILE FROM [place], [place] [phone].

## 2018-04-08 ENCOUNTER — Ambulatory Visit: Payer: BLUE CROSS/BLUE SHIELD | Admitting: Internal Medicine

## 2018-05-27 ENCOUNTER — Telehealth: Payer: Self-pay | Admitting: Internal Medicine

## 2018-05-27 NOTE — Telephone Encounter (Signed)
Spoke to patient's wife. Provided MyChart activation code. She will help him get signed up with MyChart. I will call her back to arrange changing his visit to virtual on 4/13 with Dr. Harrington Challenger.

## 2018-05-27 NOTE — Telephone Encounter (Signed)
New Message   Pts wife is calling about upcoming appt and doesn't think a virtual visit would be a good option for the pt  Please call pt back

## 2018-06-01 ENCOUNTER — Telehealth: Payer: Self-pay

## 2018-06-01 NOTE — Telephone Encounter (Signed)
    Virtual Visit Pre-Appointment Phone Call  Steps For Call:  1. Confirm consent - "In the setting of the current Covid19 crisis, you are scheduled for a VIDEO visit with your provider on 4/13 at 8:40am.  Just as we do with many in-office visits, in order for you to participate in this visit, we must obtain consent.  If you'd like, I can send this to your mychart (if signed up) or email for you to review.  Otherwise, I can obtain your verbal consent now.  All virtual visits are billed to your insurance company just like a normal visit would be.  By agreeing to a virtual visit, we'd like you to understand that the technology does not allow for your provider to perform an examination, and thus may limit your provider's ability to fully assess your condition.  Finally, though the technology is pretty good, we cannot assure that it will always work on either your or our end, and in the setting of a video visit, we may have to convert it to a phone-only visit.  In either situation, we cannot ensure that we have a secure connection.  Are you willing to proceed?"  2. Give patient instructions for WebEx download to smartphone as below if video visit  3. Advise patient to be prepared with any vital sign or heart rhythm information, their current medicines, and a piece of paper and pen handy for any instructions they may receive the day of their visit  4. Inform patient they will receive a phone call 15 minutes prior to their appointment time (may be from unknown caller ID) so they should be prepared to answer  5. Confirm that appointment type is correct in Epic appointment notes (video vs telephone)    TELEPHONE CALL NOTE  Kerry Adams has been deemed a candidate for a follow-up tele-health visit to limit community exposure during the Covid-19 pandemic. I spoke with the patient via phone to ensure availability of phone/video source, confirm preferred email & phone number, and discuss instructions and  expectations.  I reminded Kerry Adams to be prepared with any vital sign and/or heart rhythm information that could potentially be obtained via home monitoring, at the time of his visit. I reminded Kerry Adams to expect a phone call at the time of his visit if his visit.  Did the patient verbally acknowledge consent to treatment? YES  Wilma Flavin, RN 06/01/2018 9:02 AM

## 2018-06-04 ENCOUNTER — Encounter: Payer: Self-pay | Admitting: Internal Medicine

## 2018-06-07 ENCOUNTER — Encounter

## 2018-06-07 ENCOUNTER — Other Ambulatory Visit: Payer: Self-pay

## 2018-06-07 ENCOUNTER — Telehealth (INDEPENDENT_AMBULATORY_CARE_PROVIDER_SITE_OTHER): Payer: Self-pay | Admitting: Internal Medicine

## 2018-06-07 ENCOUNTER — Encounter: Payer: Self-pay | Admitting: Internal Medicine

## 2018-06-07 ENCOUNTER — Telehealth: Payer: Self-pay | Admitting: Internal Medicine

## 2018-06-07 VITALS — BP 114/75 | HR 93 | Ht 73.0 in | Wt 212.0 lb

## 2018-06-07 DIAGNOSIS — I499 Cardiac arrhythmia, unspecified: Secondary | ICD-10-CM

## 2018-06-07 DIAGNOSIS — I10 Essential (primary) hypertension: Secondary | ICD-10-CM

## 2018-06-07 DIAGNOSIS — E782 Mixed hyperlipidemia: Secondary | ICD-10-CM

## 2018-06-07 DIAGNOSIS — I2510 Atherosclerotic heart disease of native coronary artery without angina pectoris: Secondary | ICD-10-CM

## 2018-06-07 DIAGNOSIS — F1010 Alcohol abuse, uncomplicated: Secondary | ICD-10-CM

## 2018-06-07 DIAGNOSIS — Z789 Other specified health status: Secondary | ICD-10-CM

## 2018-06-07 DIAGNOSIS — Z7289 Other problems related to lifestyle: Secondary | ICD-10-CM

## 2018-06-07 DIAGNOSIS — E785 Hyperlipidemia, unspecified: Secondary | ICD-10-CM

## 2018-06-07 NOTE — Telephone Encounter (Signed)
Received MyChart message after virtual visit with Dr. Harrington Challenger today.  Per pt's wife his BP cuff shows heart beat is irregular. Called and spoke to patient's wife and informed that Zio patch (long term monitor) will be ordered and sent in mail to him.  His wife states that he is self pay and would like to get an estimate of the cost prior to having the monitor sent to him.  I adv that our monitor technician may know an approximate cost and if not our billing department may.  Again, they would like to know prior to agreeing to the monitor.  I have placed order for 14 day holter (zio) and will route to monitor dept for follow up.

## 2018-06-07 NOTE — Progress Notes (Signed)
Virtual Visit via Telephone Note   This visit type was conducted due to national recommendations for restrictions regarding the COVID-19 Pandemic (e.g. social distancing) in an effort to limit this patient's exposure and mitigate transmission in our community.  Due to his co-morbid illnesses, this patient is at least at moderate risk for complications without adequate follow up.  This format is felt to be most appropriate for this patient at this time.  The patient did not have access to video technology/had technical difficulties with video requiring transitioning to audio format only (telephone).  All issues noted in this document were discussed and addressed.  No physical exam could be performed with this format.  Please refer to the patient's chart for his  consent to telehealth for [facility].   Evaluation Performed:  Follow-up visit  Date:  06/07/2018   ID:  Kerry Adams 1955/06/04, MRN [id number]  Patient Location: Home  Provider Location: Home  PCP:  Vicenta Aly, [place]  Cardiologist:  No primary care provider on file. Magalie Almon Electrophysiologist:  None   Chief Complaint:  F/U of CAD and HL  History of Present Illness:    Kerry Adams is a 63 y.o. male who presents via audio/video conferencing for a telehealth visit today.    I last saw the pt in 2016   He has a history of CAD (CTscan wit hscore of 784; LM 26; LAD 553; LCx 295)   Myovue after was normal.   The patient also has a history of HTN, HL,(stopped statins), ETOH abuse with cirrhosis, thrombocytopenia  The pt deneis CP   No SOB  Works in yard   Has exercise bike No PND   No edema   No dizziness  The patient does not have symptoms concerning for COVID-19 infection (fever, chills, cough, or new shortness of breath).    Past Medical History:  Diagnosis Date  . Abnormal EKG   . Alcohol use 02/18/2014  . Arthritis    osteo knees and hips   . CAD (coronary artery disease)    WITHOUT ANGINA PECTORIS,  UNSPECIFIED VESSEL OR LESION TYPE, UNSPECIFIED WHETHER NATIVE OR TRANSPLANTED HEART  . Chronic liver disease   . Cirrhosis ([place])   . Closed right hip fracture ([employer]) 02/18/2014  . Dizziness   . Elevated liver function tests   . GERD (gastroesophageal reflux disease)    treats with OTC Ranitidine  . Hip fracture requiring operative repair ([place]) 02/19/2014  . Hyperlipidemia   . Hypertension   . Osteoarthritis of knee, unilateral 03/01/2015  . Primary osteoarthritis of right knee 05/23/2015  . Thrombocytopenia ([place])    Past Surgical History:  Procedure Laterality Date  . FRACTURE SURGERY     right femur  . HERNIA REPAIR     umbilical  . INTERTROCHANTERIC HIP FRACTURE SURGERY Right 02/19/2014  . INTRAMEDULLARY (IM) NAIL INTERTROCHANTERIC Right 02/19/2014   Procedure: INTRAMEDULLARY (IM) NAIL INTERTROCHANTRIC;  Surgeon: [name], MD;  Location: [place];  Service: Orthopedics;  Laterality: Right;  . SHOULDER SURGERY Right   . TOTAL KNEE ARTHROPLASTY Left 03/01/2015   Procedure: TOTAL KNEE ARTHROPLASTY;  Surgeon: [name], MD;  Location: [place];  Service: Orthopedics;  Laterality: Left;  Left knee arthroplasty  . TOTAL KNEE ARTHROPLASTY Right 05/23/2015   Procedure: TOTAL KNEE ARTHROPLASTY W/HARDWARE REMOVAL FEMUR;  Surgeon: Frederik Pear, MD;  Location: [place];  Service: Orthopedics;  Laterality: Right;     Current Meds  Medication Sig  . amLODipine-benazepril (LOTREL)  5-20 MG capsule Take 1 capsule by mouth daily.  Marland Kitchen amoxicillin (AMOXIL) 500 MG capsule Take 2,000 mg by mouth as directed. Before dental work  . aspirin 325 MG tablet Take 325 mg by mouth daily.  . Potassium 99 MG TABS Take 99 mg by mouth daily.  . RaNITidine HCl (ACID REDUCER PO) Take 1 tablet by mouth daily.     Allergies:   Patient has no known allergies.   Social History   Tobacco Use  . Smoking status: Never Smoker  . Smokeless tobacco: Never Used  Substance Use Topics  . Alcohol use: Yes     Alcohol/week: 32.0 standard drinks    Types: 32 Cans of beer per week    Comment: 6 beers /day  . Drug use: No     Family Hx: The patient's family history includes Healthy in his brother; Hypertension in his mother; Stroke in his father.  ROS:   Please see the history of present illness.     All other systems reviewed and are negative.   Prior CV studies:   The following studies were reviewed today:   Labs/Other Tests and Data Reviewed:    EKG:  Not done   Recent Labs: No results found for requested labs within last 8760 hours.   Recent Lipid Panel Lab Results  Component Value Date/Time   CHOL 209 (H) 07/22/2012 07:59 AM   TRIG 36.0 07/22/2012 07:59 AM   HDL 140.10 07/22/2012 07:59 AM   CHOLHDL 1 07/22/2012 07:59 AM   LDLDIRECT 58.5 07/22/2012 07:59 AM    Wt Readings from Last 3 Encounters:  06/07/18 212 lb (96.2 kg)  05/23/15 223 lb 13 oz (101.5 kg)  05/10/15 223 lb 12.8 oz (101.5 kg)     Objective:    Vital Signs:  BP 114/75   Pulse 93   Ht 6\' 1"  (1.854 m)   Wt 212 lb (96.2 kg)   BMI 27.97 kg/m    Well nourished, well developed male in no acute distress.  ASSESSMENT & PLAN:    1. CAD   Pt activie  No symptoms of angina    Cut ASA to 81 mg    2   HTN   BP running good on current meds  3   Irregular heart beat   Noted to be irregular by primary NP  EKG not done    Pt denies palpitations.   Call if occurs or if BP cuff reads irreg  4   HL   LDL 106, HDL 65   Not watching diet much    I would recomm a statin Crestor 10   Pt will reflect and call  5  EtOH   Watch alcohol amts   COVID-19 Education: The signs and symptoms of COVID-19 were discussed with the patient and how to seek care for testing (follow up with PCP or arrange E-visit).  The importance of social distancing was discussed today.  Time:   Today, I have spent 30 minutes with the patient with telehealth technology discussing the above problems.     Medication Adjustments/Labs and  Tests Ordered: Current medicines are reviewed at length with the patient today.  Concerns regarding medicines are outlined above.  Tests Ordered: No orders of the defined types were placed in this encounter.  Medication Changes: No orders of the defined types were placed in this encounter.   Disposition:  Follow up December 20202  Signed, Dorris Carnes, MD  06/07/2018 8:42 AM  [facility]

## 2018-06-07 NOTE — Patient Instructions (Signed)
Medication Instructions:  No changes If you need a refill on your cardiac medications before your next appointment, please call your pharmacy.   Lab work: none If you have labs (blood work) drawn today and your tests are completely normal, you will receive your results only by: Marland Kitchen MyChart Message (if you have MyChart) OR . A paper copy in the mail If you have any lab test that is abnormal or we need to change your treatment, we will call you to review the results.  Testing/Procedures: none  Follow-Up: At [facility], you and your health needs are our priority.  As part of our continuing mission to provide you with exceptional heart care, we have created designated Provider Care Teams.  These Care Teams include your primary Cardiologist (physician) and Advanced Practice Providers (APPs -  Physician Assistants and Nurse Practitioners) who all work together to provide you with the care you need, when you need it. . We will plan for follow up in December, 2020 with Kerry Adams.  Please call the office at [phone] two months in advance to schedule an appointment.  Any Other Special Instructions Will Be Listed Below (If Applicable).

## 2018-06-07 NOTE — Addendum Note (Signed)
Addended by: Rodman Key on: 06/07/2018 02:16 PM   Modules accepted: Orders

## 2018-06-07 NOTE — Telephone Encounter (Signed)
Have a Zio patch mailed to him to pick up rhythm

## 2018-06-08 ENCOUNTER — Telehealth: Payer: Self-pay | Admitting: Radiology

## 2018-06-08 NOTE — Telephone Encounter (Signed)
Monitor has been mailed.

## 2018-06-08 NOTE — Telephone Encounter (Signed)
Enrolled patient for a 14 day Zio long term Monitor to be mailed due to Covid-19. Went over instructions with wife and they know to expect monitor in 3-4 days. Patient will be self pay. They are gonna call the company and verify how much they will have to pay before applying the monitor.

## 2018-06-12 ENCOUNTER — Ambulatory Visit (INDEPENDENT_AMBULATORY_CARE_PROVIDER_SITE_OTHER): Payer: Self-pay

## 2018-06-12 DIAGNOSIS — I499 Cardiac arrhythmia, unspecified: Secondary | ICD-10-CM

## 2018-07-05 ENCOUNTER — Other Ambulatory Visit: Payer: Self-pay

## 2018-07-12 ENCOUNTER — Telehealth: Payer: Self-pay

## 2018-07-12 DIAGNOSIS — I4891 Unspecified atrial fibrillation: Secondary | ICD-10-CM

## 2018-07-12 MED ORDER — RIVAROXABAN 20 MG PO TABS: 20.0000 mg | ORAL_TABLET | Freq: Every day | ORAL | 3 refills | Status: DC

## 2018-07-12 MED ORDER — AMLODIPINE BESYLATE 5 MG PO TABS: 5.0000 mg | ORAL_TABLET | Freq: Every day | ORAL | 3 refills | Status: DC

## 2018-07-12 NOTE — Telephone Encounter (Signed)
lpmtcb 5/18 Dr Harrington Challenger' recommendations.

## 2018-07-12 NOTE — Telephone Encounter (Signed)
-----   Message from Frederik Schmidt, RN sent at 07/12/2018  8:30 AM EDT -----  ----- Message ----- From: Fay Records, MD Sent: 07/10/2018   2:25 PM EDT To: Rebeca Alert [facility] Triage  Spoke to patient re monitor results Atrial fib   New   He does not sense REcom: Stop ASA Start Xarelto 20 mg Needs echo Need CBC, CMET, TSH  Check lipids as well   WIfe notes BP is a little low    WOuld stop lotrel and switch to amlodipine 5    [facility] [address]

## 2018-07-12 NOTE — Telephone Encounter (Signed)
Follow up:    Patient calling and would like know if he needs to fast. Please call patient.

## 2018-07-12 NOTE — Telephone Encounter (Signed)
I spoke to the patient's wife Arbie Cookey) and recommended fasting prior to labs drawn Wednesday morning.  They verbalized understanding.

## 2018-07-12 NOTE — Telephone Encounter (Signed)
Notes recorded by Frederik Schmidt, RN on 07/12/2018 at 9:41 AM EDT The patient and his wife Arbie Cookey) have been notified of the result and verbalized understanding. All questions (if any) were answered. Frederik Schmidt, RN 07/12/2018 9:40 AM

## 2018-07-14 ENCOUNTER — Other Ambulatory Visit: Payer: Self-pay | Admitting: *Deleted

## 2018-07-14 ENCOUNTER — Other Ambulatory Visit: Payer: Self-pay

## 2018-07-14 ENCOUNTER — Other Ambulatory Visit (HOSPITAL_COMMUNITY): Payer: Self-pay

## 2018-07-14 DIAGNOSIS — I4891 Unspecified atrial fibrillation: Secondary | ICD-10-CM

## 2018-07-15 LAB — CBC
Hematocrit: 45.7 % (ref 37.5–51.0)
Hemoglobin: 16.5 g/dL (ref 13.0–17.7)
MCH: 33.5 pg — ABNORMAL HIGH (ref 26.6–33.0)
MCHC: 36.1 g/dL — ABNORMAL HIGH (ref 31.5–35.7)
MCV: 93 fL (ref 79–97)
Platelets: 129 10*3/uL — ABNORMAL LOW (ref 150–450)
RBC: 4.92 x10E6/uL (ref 4.14–5.80)
RDW: 12.7 % (ref 11.6–15.4)
WBC: 5.3 10*3/uL (ref 3.4–10.8)

## 2018-07-15 LAB — LIPID PANEL
Chol/HDL Ratio: 2.2 ratio (ref 0.0–5.0)
Cholesterol, Total: 223 mg/dL — ABNORMAL HIGH (ref 100–199)
HDL: 100 mg/dL (ref >39)
LDL Calculated: 110 mg/dL — ABNORMAL HIGH (ref 0–99)
Triglycerides: 64 mg/dL (ref 0–149)
VLDL Cholesterol Cal: 13 mg/dL (ref 5–40)

## 2018-07-15 LAB — COMPREHENSIVE METABOLIC PANEL
ALT: 61 IU/L — ABNORMAL HIGH (ref 0–44)
AST: 108 IU/L — ABNORMAL HIGH (ref 0–40)
Albumin/Globulin Ratio: 1.3 (ref 1.2–2.2)
Albumin: 4.2 g/dL (ref 3.8–4.8)
Alkaline Phosphatase: 185 IU/L — ABNORMAL HIGH (ref 39–117)
BUN/Creatinine Ratio: 9 — ABNORMAL LOW (ref 10–24)
BUN: 6 mg/dL — ABNORMAL LOW (ref 8–27)
Bilirubin Total: 1.5 mg/dL — ABNORMAL HIGH (ref 0.0–1.2)
CO2: 22 mmol/L (ref 20–29)
Calcium: 9.7 mg/dL (ref 8.6–10.2)
Chloride: 102 mmol/L (ref 96–106)
Creatinine, Ser: 0.68 mg/dL — ABNORMAL LOW (ref 0.76–1.27)
GFR calc Af Amer: 117 mL/min/{1.73_m2} (ref >59)
GFR calc non Af Amer: 102 mL/min/{1.73_m2} (ref >59)
Globulin, Total: 3.3 g/dL (ref 1.5–4.5)
Glucose: 109 mg/dL — ABNORMAL HIGH (ref 65–99)
Potassium: 4.5 mmol/L (ref 3.5–5.2)
Sodium: 141 mmol/L (ref 134–144)
Total Protein: 7.5 g/dL (ref 6.0–8.5)

## 2018-07-15 LAB — TSH: TSH: 2.79 u[IU]/mL (ref 0.450–4.500)

## 2018-07-16 ENCOUNTER — Telehealth (HOSPITAL_COMMUNITY): Payer: Self-pay | Admitting: Cardiology

## 2018-07-16 NOTE — Telephone Encounter (Signed)

## 2018-07-20 ENCOUNTER — Ambulatory Visit (HOSPITAL_COMMUNITY): Payer: Self-pay | Attending: Cardiovascular Disease

## 2018-07-20 ENCOUNTER — Other Ambulatory Visit: Payer: Self-pay

## 2018-07-20 DIAGNOSIS — I4891 Unspecified atrial fibrillation: Secondary | ICD-10-CM

## 2018-07-21 ENCOUNTER — Telehealth: Payer: Self-pay | Admitting: *Deleted

## 2018-07-21 DIAGNOSIS — R7989 Other specified abnormal findings of blood chemistry: Secondary | ICD-10-CM

## 2018-07-21 NOTE — Telephone Encounter (Signed)
**Note De-Identified Kerry Adams Obfuscation** I called the pt and he gave permission for me to talk with his wife Kerry Adams concerning this matter.  I did provide Kerry Adams with [name] and [name] pt asst programs phone number and I advised her that we have left the pt 2 bottles of Xarelto samples and a free 30 day co-pay card in the downstairs lobby at the [facility] office on [address].   She verbalized understanding and states that the pt is coming to the office for lab work on Friday 5/29 and will pick up then.

## 2018-07-21 NOTE — Telephone Encounter (Signed)
Informed patient and his wife of results/recommendations by Dr. Harrington Challenger.  He will come Friday to repeat LFTs.   Awaiting echo results from yesterday. Continuing to monitor BP averages 115-120/80-85  Concerned about cost of Xarelto. He will pick up 30 day free trial card Friday at registration.  Aware I will forward to [employer]. [employer] to review/discuss income guidelines for patient assistance.  Pt does not have insurance and pays cash.  Reviewed recent med changes:  Has stopped aspirin. Has started Xarelto  Has stopped lotrel and started amlodipine

## 2018-07-21 NOTE — Telephone Encounter (Signed)
Michalene, would you like to leave the pt a 2 week supply of samples along with free 30 day and a $10 copay card as well.

## 2018-07-21 NOTE — Telephone Encounter (Signed)
Tye [place] it is ok to give the pt 2 bottles of samples and a free 30 day card.  I will contact the pt and give him the phone number to reach [facility] and and [facility] Pt asst program to ask questions about limitations/applying.

## 2018-07-23 ENCOUNTER — Other Ambulatory Visit: Payer: Self-pay | Admitting: *Deleted

## 2018-07-23 ENCOUNTER — Other Ambulatory Visit: Payer: Self-pay

## 2018-07-23 DIAGNOSIS — R7989 Other specified abnormal findings of blood chemistry: Secondary | ICD-10-CM

## 2018-07-23 LAB — HEPATIC FUNCTION PANEL
ALT: 51 IU/L — ABNORMAL HIGH (ref 0–44)
AST: 100 IU/L — ABNORMAL HIGH (ref 0–40)
Albumin: 4 g/dL (ref 3.8–4.8)
Alkaline Phosphatase: 156 IU/L — ABNORMAL HIGH (ref 39–117)
Bilirubin Total: 1.3 mg/dL — ABNORMAL HIGH (ref 0.0–1.2)
Bilirubin, Direct: 0.63 mg/dL — ABNORMAL HIGH (ref 0.00–0.40)
Total Protein: 6.8 g/dL (ref 6.0–8.5)

## 2018-07-28 ENCOUNTER — Encounter (HOSPITAL_COMMUNITY): Payer: Self-pay | Admitting: *Deleted

## 2018-08-18 ENCOUNTER — Telehealth: Payer: Self-pay | Admitting: Internal Medicine

## 2018-08-18 ENCOUNTER — Encounter: Payer: Self-pay | Admitting: *Deleted

## 2018-08-18 DIAGNOSIS — Z01812 Encounter for preprocedural laboratory examination: Secondary | ICD-10-CM

## 2018-08-18 DIAGNOSIS — I4891 Unspecified atrial fibrillation: Secondary | ICD-10-CM

## 2018-08-18 NOTE — Telephone Encounter (Signed)
Spoke with the pts wife and she stated she is calling back to speak with Teacher, music, as instructed to, to get the pts DCCV arranged, since he's been on Xarelto for one month.  Informed the pts wife that I will route this communication to Dr Harrington Challenger RN to further follow-up with both parties, upon her return to the office.  Wife verbalized understanding and agrees with this plan.

## 2018-08-18 NOTE — Telephone Encounter (Signed)
Scheduled virtual visit with dr. Harrington Challenger for updating H&P 08/19/18 Scheduled DCCV with Dr. Harrell Gave Monday 6/29 at 9:45 am Scheduled covid testing for Friday, 6/26 1:10 pm Can come for labs at [facility] [address] office prior.       COVID-19 Pre-Screening Questions:  . In the past 7 to 10 days have you had a cough,  shortness of breath, headache, congestion, fever (100 or greater) body aches, chills, sore throat, or sudden loss of taste or sense of smell?   NO . Have you been around anyone with known Covid 19.  NO . Have you been around anyone who is awaiting Covid 19 test results in the past 7 to 10 days?  NO . Have you been around anyone who has been exposed to Covid 19, or has mentioned symptoms of Covid 19 within the past 7 to 10 days?  NO  If you have any concerns/questions about symptoms patients report during screening (either on the phone or at threshold). Contact the provider seeing the patient or DOD for further guidance.  If neither are available contact a member of the leadership team.

## 2018-08-18 NOTE — Telephone Encounter (Signed)
  Wife is calling because Mr Kerry Adams is done with the 30 days of Xarelto and they are wondering when his appt will be made for his cardioversion. They have not heard anything

## 2018-08-19 ENCOUNTER — Other Ambulatory Visit: Payer: Self-pay

## 2018-08-19 ENCOUNTER — Telehealth (INDEPENDENT_AMBULATORY_CARE_PROVIDER_SITE_OTHER): Payer: Self-pay | Admitting: Internal Medicine

## 2018-08-19 ENCOUNTER — Encounter: Payer: Self-pay | Admitting: Internal Medicine

## 2018-08-19 VITALS — BP 123/82 | HR 62 | Ht 73.0 in | Wt 211.0 lb

## 2018-08-19 DIAGNOSIS — I4819 Other persistent atrial fibrillation: Secondary | ICD-10-CM

## 2018-08-19 DIAGNOSIS — E782 Mixed hyperlipidemia: Secondary | ICD-10-CM

## 2018-08-19 DIAGNOSIS — I10 Essential (primary) hypertension: Secondary | ICD-10-CM

## 2018-08-19 DIAGNOSIS — I2510 Atherosclerotic heart disease of native coronary artery without angina pectoris: Secondary | ICD-10-CM

## 2018-08-19 NOTE — Telephone Encounter (Signed)
Erroneous encounter

## 2018-08-19 NOTE — Progress Notes (Signed)
Virtual Visit via Telephone Note   This visit type was conducted due to national recommendations for restrictions regarding the COVID-19 Pandemic (e.g. social distancing) in an effort to limit this patient's exposure and mitigate transmission in our community.  Due to his co-morbid illnesses, this patient is at least at moderate risk for complications without adequate follow up.  This format is felt to be most appropriate for this patient at this time.  The patient did not have access to video technology/had technical difficulties with video requiring transitioning to audio format only (telephone).  All issues noted in this document were discussed and addressed.  No physical exam could be performed with this format.  Please refer to the patient's chart for his  consent to telehealth for [facility].   Date:  08/19/2018   ID:  Kerry, Adams [birth year], MRN [id number]  Patient Location: Home Provider Location: Home  PCP:  Vicenta Aly, [place]  Cardiologist: Harrington Challenger   Evaluation Performed:  Follow-Up Visit  Chief Complaint:  Follow up of atrial fib  History of Present Illness:    Kerry Adams is a 63 y.o. male withI last saw the pt in 2016   He has a history of CAD (CTscan wit hscore of 784; LM 26; LAD 553; LCx 295)   Myovue after was normal.   The patient also has a history of HTN, HL,(stopped statins), ETOH abuse with cirrhosis, thrombocytopenia  I saw the patient virtually in April   He complained of an irregular heart beat   I recomm that he be set up for a monitor  The pt was in atrial fibrillation throughout.   The patient was started on anticoalugation Echo was then ordered LVEF and RVEF were normal    Atria noted to be enlarged   He was set up for cardioversion  Since seen he denies palpitations.   Breathing is OK  No dizziness  No CP His wife says that he takes Xarelto in am, not always with food however    The patient does not have symptoms concerning for COVID-19  infection (fever, chills, cough, or new shortness of breath).    Past Medical History:  Diagnosis Date  . Abnormal EKG   . Alcohol use 02/18/2014  . Arthritis    osteo knees and hips   . CAD (coronary artery disease)    WITHOUT ANGINA PECTORIS, UNSPECIFIED VESSEL OR LESION TYPE, UNSPECIFIED WHETHER NATIVE OR TRANSPLANTED HEART  . Chronic liver disease   . Cirrhosis ([place])   . Closed right hip fracture ([place]) 02/18/2014  . Dizziness   . Elevated liver function tests   . GERD (gastroesophageal reflux disease)    treats with OTC Ranitidine  . Hip fracture requiring operative repair ([place]) 02/19/2014  . Hyperlipidemia   . Hypertension   . Osteoarthritis of knee, unilateral 03/01/2015  . Primary osteoarthritis of right knee 05/23/2015  . Thrombocytopenia ([place])    Past Surgical History:  Procedure Laterality Date  . FRACTURE SURGERY     right femur  . HERNIA REPAIR     umbilical  . INTERTROCHANTERIC HIP FRACTURE SURGERY Right 02/19/2014  . INTRAMEDULLARY (IM) NAIL INTERTROCHANTERIC Right 02/19/2014   Procedure: INTRAMEDULLARY (IM) NAIL INTERTROCHANTRIC;  Surgeon: [name], MD;  Location: [place];  Service: Orthopedics;  Laterality: Right;  . SHOULDER SURGERY Right   . TOTAL KNEE ARTHROPLASTY Left 03/01/2015   Procedure: TOTAL KNEE ARTHROPLASTY;  Surgeon: [name], MD;  Location: [place];  Service: Orthopedics;  Laterality: Left;  Left knee arthroplasty  . TOTAL KNEE ARTHROPLASTY Right 05/23/2015   Procedure: TOTAL KNEE ARTHROPLASTY W/HARDWARE REMOVAL FEMUR;  Surgeon: Frederik Pear, MD;  Location: [place];  Service: Orthopedics;  Laterality: Right;     Current Meds  Medication Sig  . amLODipine (NORVASC) 5 MG tablet Take 1 tablet (5 mg total) by mouth daily.  Marland Kitchen amoxicillin (AMOXIL) 500 MG capsule Take 2,000 mg by mouth as directed. Before dental work  . famotidine (PEPCID) 10 MG tablet Take 10 mg by mouth 2 (two) times daily.  . Potassium 99 MG TABS Take 99 mg by mouth  daily.  . rivaroxaban (XARELTO) 20 MG TABS tablet Take 1 tablet (20 mg total) by mouth daily with supper. (Patient taking differently: Take 20 mg by mouth every morning. )     Allergies:   Patient has no known allergies.   Social History   Tobacco Use  . Smoking status: Never Smoker  . Smokeless tobacco: Never Used  Substance Use Topics  . Alcohol use: Yes    Alcohol/week: 32.0 standard drinks    Types: 32 Cans of beer per week    Comment: 6 beers /day  . Drug use: No     Family Hx: The patient's family history includes Healthy in his brother; Hypertension in his mother; Stroke in his father.  ROS:   Please see the history of present illness.    All other systems reviewed and are negative.   Prior CV studies:   The following studies were reviewed today: Echo 07/20/18 IMPRESSIONS    1. The left ventricle has normal systolic function with an ejection fraction of 60-65%. The cavity size was normal. There is mildly increased left ventricular wall thickness. Left ventricular diastolic Doppler parameters are indeterminate secondary to  atrial fibrillation.  2. The right ventricle has normal systolic function. The cavity was normal. There is no increase in right ventricular wall thickness.  3. Left atrial size was severely dilated.  4. Right atrial size was severely dilated.  5. There is mild mitral annular calcification present.  6. The aortic valve is tricuspid. Aortic valve regurgitation was not assessed by color flow Doppler.  FINDINGS  Left Ventricle: The left ventricle has normal systolic function, with an ejection fraction of 60-65%. The cavity size was normal. There is mildly increased left ventricular wall thickness. Left ventricular diastolic Doppler parameters are indeterminate  secondary to atrial fibrillation.  Right Ventricle: The right ventricle has normal systolic function. The cavity was normal. There is no increase in right ventricular wall thickness.   Left Atrium: Left atrial size was severely dilated.  Right Atrium: Right atrial size was severely dilated. Right atrial pressure is estimated at 10 mmHg.  Interatrial Septum: No atrial level shunt detected by color flow Doppler.  Pericardium: There is no evidence of pericardial effusion.  Mitral Valve: The mitral valve is normal in structure. There is mild mitral annular calcification present. Mitral valve regurgitation is mild by color flow Doppler.  Tricuspid Valve: The tricuspid valve is normal in structure. Tricuspid valve regurgitation is mild by color flow Doppler.  Aortic Valve: The aortic valve is tricuspid Aortic valve regurgitation was not assessed by color flow Doppler.  Pulmonic Valve: The pulmonic valve was not assessed. Pulmonic valve regurgitation is trivial by color flow Doppler.  Venous: The inferior vena cava measures 1.40 cm, is normal in size with greater than 50% respiratory variability.    +--------------+--------++ LEFT VENTRICLE         +--------------+--------++  PLAX 2D                +--------------+--------++ LVIDd:        4.40 cm  +--------------+--------++ LVIDs:        3.00 cm  +--------------+--------++ LV PW:        1.30 cm  +--------------+--------++ LV IVS:       1.30 cm  +--------------+--------++ LVOT diam:    2.50 cm  +--------------+--------++ LV SV:        53 ml    +--------------+--------++ LV SV Index:  23.51    +--------------+--------++ LVOT Area:    4.91 cm +--------------+--------++                        +--------------+--------++  +---------------+---------++ RIGHT VENTRICLE          +---------------+---------++ RVSP:          31.6 mmHg +---------------+---------++  +---------------+--------++-----------++ LEFT ATRIUM            Index       +---------------+--------++-----------++ LA diam:       5.90 cm 2.68 cm/m   +---------------+--------++-----------++ LA Vol (A2C):  109.0 ml49.42 ml/m +---------------+--------++-----------++ LA Vol (A4C):  143.0 ml64.84 ml/m +---------------+--------++-----------++ LA Biplane Vol:131.0 ml59.39 ml/m +---------------+--------++-----------++ +------------+---------++-----------++ RIGHT ATRIUM         Index       +------------+---------++-----------++ RA Pressure:3.00 mmHg            +------------+---------++-----------++ RA Area:    29.90 cm            +------------+---------++-----------++ RA Volume:  120.00 ml54.41 ml/m +------------+---------++-----------++  +------------+-----------++ AORTIC VALVE            +------------+-----------++ LVOT Vmax:  70.90 cm/s  +------------+-----------++ LVOT Vmean: 48.600 cm/s +------------+-----------++ LVOT VTI:   0.129 m     +------------+-----------++   +-------------+-------++ AORTA                +-------------+-------++ Ao Root diam:3.50 cm +-------------+-------++ Ao Asc diam: 3.70 cm +-------------+-------++  +--------------+----------++  +---------------+-----------++ MITRAL VALVE              TRICUSPID VALVE            +--------------+----------++  +---------------+-----------++ MV Area (PHT):4.13 cm    TR Peak grad:  28.6 mmHg   +--------------+----------++  +---------------+-----------++ MV PHT:       53.24 msec  TR Vmax:       284.00 cm/s +--------------+----------++  +---------------+-----------++ MV Decel Time:184 msec    Estimated RAP: 3.00 mmHg   +--------------+----------++  +---------------+-----------++ +--------------+-----------++ RVSP:          31.6 mmHg   MV E velocity:118.20 cm/s +---------------+-----------++ +--------------+-----------++                               +--------------+-------+                               SHUNTS                                               +--------------+-------+  Systemic VTI: 0.13 m                                +--------------+-------+                               Systemic Diam:2.50 cm                               +--------------+-------+  +---------+-------+ IVC              +---------+-------+ IVC diam:1.40 cm +---------+-------+    Skeet Latch MD Electronically signed by Skeet Latch MD Signature Date/Time: 07/20/2018/6:58:06 PM      Labs/Other Tests and Data Reviewed:    EKG:  No ECG reviewed.  Recent Labs: 07/14/2018: BUN 6; Creatinine, Ser 0.68; Hemoglobin 16.5; Platelets 129; Potassium 4.5; Sodium 141; TSH 2.790 07/23/2018: ALT 51   Recent Lipid Panel Lab Results  Component Value Date/Time   CHOL 223 (H) 07/14/2018 08:49 AM   TRIG 64 07/14/2018 08:49 AM   HDL 100 07/14/2018 08:49 AM   CHOLHDL 2.2 07/14/2018 08:49 AM   CHOLHDL 1 07/22/2012 07:59 AM   LDLCALC 110 (H) 07/14/2018 08:49 AM   LDLDIRECT 58.5 07/22/2012 07:59 AM    Wt Readings from Last 3 Encounters:  08/19/18 211 lb (95.7 kg)  06/07/18 212 lb (96.2 kg)  05/23/15 223 lb 13 oz (101.5 kg)     Objective:    Vital Signs:  BP 123/82   Pulse 62   Ht 6\' 1"  (1.854 m)   Wt 211 lb (95.7 kg)   BMI 27.84 kg/m    VITAL SIGNS:  reviewed  ASSESSMENT & PLAN:    1. Atrial fibrillation.  Plan was for cardioversion once to see if he can return/maintain SR   Unfortunately he took Xarelto with variable food   This could have affected absorption.   WIll therefore postpone cardioversion for another 3 wks    Take Xarelto with largest meal     2  CAD   No symptoms to sugg ischemia   3   HTN  FOllow  BP is OK  4  HL  LDL was 110   Liver function were elevated  On 2 recent checks   Plan for testing at [facility]. (Had [facility] in past,)  He is self pay.  Hx ETOH  COVID-19 Education: The signs and symptoms of COVID-19 were discussed with the patient and how to seek care for testing  (follow up with PCP or arrange E-visit).  The importance of social distancing was discussed today.  Time:   Today, I have spent 20 minutes with the patient with telehealth technology discussing the above problems.     Medication Adjustments/Labs and Tests Ordered: Current medicines are reviewed at length with the patient today.  Concerns regarding medicines are outlined above.   Tests Ordered: No orders of the defined types were placed in this encounter.   Medication Changes: No orders of the defined types were placed in this encounter.   Follow Up:   F/U after cardioversion  Signed, Dorris Carnes, MD  08/19/2018 8:26 AM    [place]

## 2018-08-19 NOTE — H&P (View-Only) (Signed)
Virtual Visit via Telephone Note   This visit type was conducted due to national recommendations for restrictions regarding the COVID-19 Pandemic (e.g. social distancing) in an effort to limit this patient's exposure and mitigate transmission in our community.  Due to his co-morbid illnesses, this patient is at least at moderate risk for complications without adequate follow up.  This format is felt to be most appropriate for this patient at this time.  The patient did not have access to video technology/had technical difficulties with video requiring transitioning to audio format only (telephone).  All issues noted in this document were discussed and addressed.  No physical exam could be performed with this format.  Please refer to the patient's chart for his  consent to telehealth for [facility].   Date:  08/19/2018   ID:  Kerry Adams, Kerry Adams [birth year], MRN [id number]  Patient Location: Home Provider Location: Home  PCP:  Vicenta Aly, [place]  Cardiologist: Harrington Challenger   Evaluation Performed:  Follow-Up Visit  Chief Complaint:  Follow up of atrial fib  History of Present Illness:    Kerry Adams is a 63 y.o. male withI last saw the pt in 2016   He has a history of CAD (CTscan wit hscore of 784; LM 26; LAD 553; LCx 295)   Myovue after was normal.   The patient also has a history of HTN, HL,(stopped statins), ETOH abuse with cirrhosis, thrombocytopenia  I saw the patient virtually in April   He complained of an irregular heart beat   I recomm that he be set up for a monitor  The pt was in atrial fibrillation throughout.   The patient was started on anticoalugation Echo was then ordered LVEF and RVEF were normal    Atria noted to be enlarged   He was set up for cardioversion  Since seen he denies palpitations.   Breathing is OK  No dizziness  No CP His wife says that he takes Xarelto in am, not always with food however    The patient does not have symptoms concerning for COVID-19  infection (fever, chills, cough, or new shortness of breath).    Past Medical History:  Diagnosis Date  . Abnormal EKG   . Alcohol use 02/18/2014  . Arthritis    osteo knees and hips   . CAD (coronary artery disease)    WITHOUT ANGINA PECTORIS, UNSPECIFIED VESSEL OR LESION TYPE, UNSPECIFIED WHETHER NATIVE OR TRANSPLANTED HEART  . Chronic liver disease   . Cirrhosis ([place])   . Closed right hip fracture ([place]) 02/18/2014  . Dizziness   . Elevated liver function tests   . GERD (gastroesophageal reflux disease)    treats with OTC Ranitidine  . Hip fracture requiring operative repair ([place]) 02/19/2014  . Hyperlipidemia   . Hypertension   . Osteoarthritis of knee, unilateral 03/01/2015  . Primary osteoarthritis of right knee 05/23/2015  . Thrombocytopenia ([place])    Past Surgical History:  Procedure Laterality Date  . FRACTURE SURGERY     right femur  . HERNIA REPAIR     umbilical  . INTERTROCHANTERIC HIP FRACTURE SURGERY Right 02/19/2014  . INTRAMEDULLARY (IM) NAIL INTERTROCHANTERIC Right 02/19/2014   Procedure: INTRAMEDULLARY (IM) NAIL INTERTROCHANTRIC;  Surgeon: [name], MD;  Location: [place];  Service: Orthopedics;  Laterality: Right;  . SHOULDER SURGERY Right   . TOTAL KNEE ARTHROPLASTY Left 03/01/2015   Procedure: TOTAL KNEE ARTHROPLASTY;  Surgeon: [name], MD;  Location: [place];  Service: Orthopedics;  Laterality: Left;  Left knee arthroplasty  . TOTAL KNEE ARTHROPLASTY Right 05/23/2015   Procedure: TOTAL KNEE ARTHROPLASTY W/HARDWARE REMOVAL FEMUR;  Surgeon: Frederik Pear, MD;  Location: [place];  Service: Orthopedics;  Laterality: Right;     Current Meds  Medication Sig  . amLODipine (NORVASC) 5 MG tablet Take 1 tablet (5 mg total) by mouth daily.  Marland Kitchen amoxicillin (AMOXIL) 500 MG capsule Take 2,000 mg by mouth as directed. Before dental work  . famotidine (PEPCID) 10 MG tablet Take 10 mg by mouth 2 (two) times daily.  . Potassium 99 MG TABS Take 99 mg by mouth  daily.  . rivaroxaban (XARELTO) 20 MG TABS tablet Take 1 tablet (20 mg total) by mouth daily with supper. (Patient taking differently: Take 20 mg by mouth every morning. )     Allergies:   Patient has no known allergies.   Social History   Tobacco Use  . Smoking status: Never Smoker  . Smokeless tobacco: Never Used  Substance Use Topics  . Alcohol use: Yes    Alcohol/week: 32.0 standard drinks    Types: 32 Cans of beer per week    Comment: 6 beers /day  . Drug use: No     Family Hx: The patient's family history includes Healthy in his brother; Hypertension in his mother; Stroke in his father.  ROS:   Please see the history of present illness.    All other systems reviewed and are negative.   Prior CV studies:   The following studies were reviewed today: Echo 07/20/18 IMPRESSIONS    1. The left ventricle has normal systolic function with an ejection fraction of 60-65%. The cavity size was normal. There is mildly increased left ventricular wall thickness. Left ventricular diastolic Doppler parameters are indeterminate secondary to  atrial fibrillation.  2. The right ventricle has normal systolic function. The cavity was normal. There is no increase in right ventricular wall thickness.  3. Left atrial size was severely dilated.  4. Right atrial size was severely dilated.  5. There is mild mitral annular calcification present.  6. The aortic valve is tricuspid. Aortic valve regurgitation was not assessed by color flow Doppler.  FINDINGS  Left Ventricle: The left ventricle has normal systolic function, with an ejection fraction of 60-65%. The cavity size was normal. There is mildly increased left ventricular wall thickness. Left ventricular diastolic Doppler parameters are indeterminate  secondary to atrial fibrillation.  Right Ventricle: The right ventricle has normal systolic function. The cavity was normal. There is no increase in right ventricular wall thickness.   Left Atrium: Left atrial size was severely dilated.  Right Atrium: Right atrial size was severely dilated. Right atrial pressure is estimated at 10 mmHg.  Interatrial Septum: No atrial level shunt detected by color flow Doppler.  Pericardium: There is no evidence of pericardial effusion.  Mitral Valve: The mitral valve is normal in structure. There is mild mitral annular calcification present. Mitral valve regurgitation is mild by color flow Doppler.  Tricuspid Valve: The tricuspid valve is normal in structure. Tricuspid valve regurgitation is mild by color flow Doppler.  Aortic Valve: The aortic valve is tricuspid Aortic valve regurgitation was not assessed by color flow Doppler.  Pulmonic Valve: The pulmonic valve was not assessed. Pulmonic valve regurgitation is trivial by color flow Doppler.  Venous: The inferior vena cava measures 1.40 cm, is normal in size with greater than 50% respiratory variability.    +--------------+--------++ LEFT VENTRICLE         +--------------+--------++  PLAX 2D                +--------------+--------++ LVIDd:        4.40 cm  +--------------+--------++ LVIDs:        3.00 cm  +--------------+--------++ LV PW:        1.30 cm  +--------------+--------++ LV IVS:       1.30 cm  +--------------+--------++ LVOT diam:    2.50 cm  +--------------+--------++ LV SV:        53 ml    +--------------+--------++ LV SV Index:  23.51    +--------------+--------++ LVOT Area:    4.91 cm +--------------+--------++                        +--------------+--------++  +---------------+---------++ RIGHT VENTRICLE          +---------------+---------++ RVSP:          31.6 mmHg +---------------+---------++  +---------------+--------++-----------++ LEFT ATRIUM            Index       +---------------+--------++-----------++ LA diam:       5.90 cm 2.68 cm/m   +---------------+--------++-----------++ LA Vol (A2C):  109.0 ml49.42 ml/m +---------------+--------++-----------++ LA Vol (A4C):  143.0 ml64.84 ml/m +---------------+--------++-----------++ LA Biplane Vol:131.0 ml59.39 ml/m +---------------+--------++-----------++ +------------+---------++-----------++ RIGHT ATRIUM         Index       +------------+---------++-----------++ RA Pressure:3.00 mmHg            +------------+---------++-----------++ RA Area:    29.90 cm            +------------+---------++-----------++ RA Volume:  120.00 ml54.41 ml/m +------------+---------++-----------++  +------------+-----------++ AORTIC VALVE            +------------+-----------++ LVOT Vmax:  70.90 cm/s  +------------+-----------++ LVOT Vmean: 48.600 cm/s +------------+-----------++ LVOT VTI:   0.129 m     +------------+-----------++   +-------------+-------++ AORTA                +-------------+-------++ Ao Root diam:3.50 cm +-------------+-------++ Ao Asc diam: 3.70 cm +-------------+-------++  +--------------+----------++  +---------------+-----------++ MITRAL VALVE              TRICUSPID VALVE            +--------------+----------++  +---------------+-----------++ MV Area (PHT):4.13 cm    TR Peak grad:  28.6 mmHg   +--------------+----------++  +---------------+-----------++ MV PHT:       53.24 msec  TR Vmax:       284.00 cm/s +--------------+----------++  +---------------+-----------++ MV Decel Time:184 msec    Estimated RAP: 3.00 mmHg   +--------------+----------++  +---------------+-----------++ +--------------+-----------++ RVSP:          31.6 mmHg   MV E velocity:118.20 cm/s +---------------+-----------++ +--------------+-----------++                               +--------------+-------+                               SHUNTS                                               +--------------+-------+  Systemic VTI: 0.13 m                                +--------------+-------+                               Systemic Diam:2.50 cm                               +--------------+-------+  +---------+-------+ IVC              +---------+-------+ IVC diam:1.40 cm +---------+-------+    Skeet Latch MD Electronically signed by Skeet Latch MD Signature Date/Time: 07/20/2018/6:58:06 PM      Labs/Other Tests and Data Reviewed:    EKG:  No ECG reviewed.  Recent Labs: 07/14/2018: BUN 6; Creatinine, Ser 0.68; Hemoglobin 16.5; Platelets 129; Potassium 4.5; Sodium 141; TSH 2.790 07/23/2018: ALT 51   Recent Lipid Panel Lab Results  Component Value Date/Time   CHOL 223 (H) 07/14/2018 08:49 AM   TRIG 64 07/14/2018 08:49 AM   HDL 100 07/14/2018 08:49 AM   CHOLHDL 2.2 07/14/2018 08:49 AM   CHOLHDL 1 07/22/2012 07:59 AM   LDLCALC 110 (H) 07/14/2018 08:49 AM   LDLDIRECT 58.5 07/22/2012 07:59 AM    Wt Readings from Last 3 Encounters:  08/19/18 211 lb (95.7 kg)  06/07/18 212 lb (96.2 kg)  05/23/15 223 lb 13 oz (101.5 kg)     Objective:    Vital Signs:  BP 123/82   Pulse 62   Ht 6\' 1"  (1.854 m)   Wt 211 lb (95.7 kg)   BMI 27.84 kg/m    VITAL SIGNS:  reviewed  ASSESSMENT & PLAN:    1. Atrial fibrillation.  Plan was for cardioversion once to see if he can return/maintain SR   Unfortunately he took Xarelto with variable food   This could have affected absorption.   WIll therefore postpone cardioversion for another 3 wks    Take Xarelto with largest meal     2  CAD   No symptoms to sugg ischemia   3   HTN  FOllow  BP is OK  4  HL  LDL was 110   Liver function were elevated  On 2 recent checks   Plan for testing at [facility]. (Had [facility] in past,)  He is self pay.  Hx ETOH  COVID-19 Education: The signs and symptoms of COVID-19 were discussed with the patient and how to seek care for testing  (follow up with PCP or arrange E-visit).  The importance of social distancing was discussed today.  Time:   Today, I have spent 20 minutes with the patient with telehealth technology discussing the above problems.     Medication Adjustments/Labs and Tests Ordered: Current medicines are reviewed at length with the patient today.  Concerns regarding medicines are outlined above.   Tests Ordered: No orders of the defined types were placed in this encounter.   Medication Changes: No orders of the defined types were placed in this encounter.   Follow Up:   F/U after cardioversion  Signed, Dorris Carnes, MD  08/19/2018 8:26 AM    [place]

## 2018-08-19 NOTE — Patient Instructions (Addendum)
Medication Instructions:  Take Xarelto with your supper every day to promote best absorption.  If you need a refill on your cardiac medications before your next appointment, please call your pharmacy.   Lab work: September 10, 2018 - come to [facility] anytime between  7:30 am and 4:30 pm  Testing/Procedures: COVID 19 testing - September 10, 2018 at 11:50 am  Cardioversion is rescheduled for September 13, 2018.  9:45 am.  Eartha Inch to admitting at 8:15 am.  Follow-Up: In office visit on 10/08/18 at 9:15 am with Richardson Dopp, PA-C  Any Other Special Instructions Will Be Listed Below (If Applicable). MyChart message sent with details.

## 2018-08-20 ENCOUNTER — Other Ambulatory Visit (HOSPITAL_COMMUNITY): Payer: Self-pay

## 2018-08-20 ENCOUNTER — Other Ambulatory Visit: Payer: Self-pay

## 2018-08-23 ENCOUNTER — Encounter (HOSPITAL_COMMUNITY): Payer: Self-pay

## 2018-08-23 ENCOUNTER — Ambulatory Visit (HOSPITAL_COMMUNITY): Admit: 2018-08-23 | Payer: Self-pay | Admitting: Cardiology

## 2018-08-23 SURGERY — CARDIOVERSION
Anesthesia: General

## 2018-09-10 ENCOUNTER — Other Ambulatory Visit: Payer: Self-pay

## 2018-09-10 ENCOUNTER — Other Ambulatory Visit: Payer: Self-pay | Admitting: *Deleted

## 2018-09-10 ENCOUNTER — Other Ambulatory Visit (HOSPITAL_COMMUNITY)
Admission: RE | Admit: 2018-09-10 | Discharge: 2018-09-10 | Disposition: A | Discharge status: Home / Self Care | Payer: HRSA Program | Source: Ambulatory Visit | Attending: Cardiovascular Disease | Admitting: Cardiovascular Disease

## 2018-09-10 DIAGNOSIS — I4891 Unspecified atrial fibrillation: Secondary | ICD-10-CM

## 2018-09-10 DIAGNOSIS — Z1159 Encounter for screening for other viral diseases: Secondary | ICD-10-CM | POA: Insufficient documentation

## 2018-09-10 DIAGNOSIS — Z01812 Encounter for preprocedural laboratory examination: Secondary | ICD-10-CM

## 2018-09-10 LAB — BASIC METABOLIC PANEL
BUN/Creatinine Ratio: 15 (ref 10–24)
BUN: 9 mg/dL (ref 8–27)
CO2: 21 mmol/L (ref 20–29)
Calcium: 9.4 mg/dL (ref 8.6–10.2)
Chloride: 101 mmol/L (ref 96–106)
Creatinine, Ser: 0.62 mg/dL — ABNORMAL LOW (ref 0.76–1.27)
GFR calc Af Amer: 121 mL/min/{1.73_m2} (ref >59)
GFR calc non Af Amer: 105 mL/min/{1.73_m2} (ref >59)
Glucose: 127 mg/dL — ABNORMAL HIGH (ref 65–99)
Potassium: 4.4 mmol/L (ref 3.5–5.2)
Sodium: 136 mmol/L (ref 134–144)

## 2018-09-10 LAB — CBC
Hematocrit: 43.3 % (ref 37.5–51.0)
Hemoglobin: 15.5 g/dL (ref 13.0–17.7)
MCH: 34.3 pg — ABNORMAL HIGH (ref 26.6–33.0)
MCHC: 35.8 g/dL — ABNORMAL HIGH (ref 31.5–35.7)
MCV: 96 fL (ref 79–97)
Platelets: 140 10*3/uL — ABNORMAL LOW (ref 150–450)
RBC: 4.52 x10E6/uL (ref 4.14–5.80)
RDW: 12 % (ref 11.6–15.4)
WBC: 5.7 10*3/uL (ref 3.4–10.8)

## 2018-09-10 LAB — SARS CORONAVIRUS 2 (TAT 6-24 HRS): SARS Coronavirus 2: NEGATIVE

## 2018-09-13 ENCOUNTER — Encounter (HOSPITAL_COMMUNITY): Payer: Self-pay

## 2018-09-13 ENCOUNTER — Ambulatory Visit (HOSPITAL_COMMUNITY)
Admission: RE | Admit: 2018-09-13 | Discharge: 2018-09-13 | Disposition: A | Discharge status: Home / Self Care | Payer: Self-pay | Attending: Cardiovascular Disease | Admitting: Cardiovascular Disease

## 2018-09-13 ENCOUNTER — Encounter (HOSPITAL_COMMUNITY)
Admission: RE | Disposition: A | Discharge status: Home / Self Care | Payer: Self-pay | Source: Home / Self Care | Attending: Cardiovascular Disease

## 2018-09-13 ENCOUNTER — Ambulatory Visit (HOSPITAL_COMMUNITY): Payer: Self-pay | Admitting: Anesthesiology

## 2018-09-13 ENCOUNTER — Other Ambulatory Visit: Payer: Self-pay

## 2018-09-13 DIAGNOSIS — Z79899 Other long term (current) drug therapy: Secondary | ICD-10-CM | POA: Insufficient documentation

## 2018-09-13 DIAGNOSIS — I4891 Unspecified atrial fibrillation: Secondary | ICD-10-CM | POA: Insufficient documentation

## 2018-09-13 DIAGNOSIS — I2510 Atherosclerotic heart disease of native coronary artery without angina pectoris: Secondary | ICD-10-CM | POA: Insufficient documentation

## 2018-09-13 DIAGNOSIS — Z96653 Presence of artificial knee joint, bilateral: Secondary | ICD-10-CM | POA: Insufficient documentation

## 2018-09-13 DIAGNOSIS — K219 Gastro-esophageal reflux disease without esophagitis: Secondary | ICD-10-CM | POA: Insufficient documentation

## 2018-09-13 DIAGNOSIS — Z823 Family history of stroke: Secondary | ICD-10-CM | POA: Insufficient documentation

## 2018-09-13 DIAGNOSIS — K7460 Unspecified cirrhosis of liver: Secondary | ICD-10-CM | POA: Insufficient documentation

## 2018-09-13 DIAGNOSIS — M1711 Unilateral primary osteoarthritis, right knee: Secondary | ICD-10-CM | POA: Insufficient documentation

## 2018-09-13 DIAGNOSIS — E785 Hyperlipidemia, unspecified: Secondary | ICD-10-CM | POA: Insufficient documentation

## 2018-09-13 DIAGNOSIS — Z8249 Family history of ischemic heart disease and other diseases of the circulatory system: Secondary | ICD-10-CM | POA: Insufficient documentation

## 2018-09-13 DIAGNOSIS — Z7901 Long term (current) use of anticoagulants: Secondary | ICD-10-CM | POA: Insufficient documentation

## 2018-09-13 DIAGNOSIS — I4819 Other persistent atrial fibrillation: Secondary | ICD-10-CM

## 2018-09-13 DIAGNOSIS — I10 Essential (primary) hypertension: Secondary | ICD-10-CM | POA: Insufficient documentation

## 2018-09-13 HISTORY — PX: CARDIOVERSION: SHX1299

## 2018-09-13 SURGERY — CARDIOVERSION
Anesthesia: General

## 2018-09-13 MED ORDER — LIDOCAINE HCL (CARDIAC) PF 100 MG/5ML IV SOSY
PREFILLED_SYRINGE | INTRAVENOUS | Status: DC | PRN
  Administered 2018-09-13: 60 mg via INTRAVENOUS

## 2018-09-13 MED ORDER — PROPOFOL 10 MG/ML IV BOLUS
INTRAVENOUS | Status: DC | PRN
  Administered 2018-09-13: 160 mg via INTRAVENOUS

## 2018-09-13 MED ORDER — SODIUM CHLORIDE 0.9 % IV SOLN
INTRAVENOUS | Status: DC | PRN
  Administered 2018-09-13: 09:00:00 via INTRAVENOUS

## 2018-09-13 MED ORDER — HYDROCORTISONE 1 % EX CREA: 1.0000 "application " | TOPICAL_CREAM | Freq: Three times a day (TID) | CUTANEOUS | Status: DC | PRN

## 2018-09-13 NOTE — Anesthesia Postprocedure Evaluation (Signed)
Anesthesia Post Note  Patient: Kerry EARNSHAW  Procedure(s) Performed: CARDIOVERSION (N/A )     Patient location during evaluation: Endoscopy Anesthesia Type: General Level of consciousness: awake and alert Pain management: pain level controlled Vital Signs Assessment: post-procedure vital signs reviewed and stable Respiratory status: spontaneous breathing, nonlabored ventilation, respiratory function stable and patient connected to nasal cannula oxygen Cardiovascular status: blood pressure returned to baseline and stable Postop Assessment: no apparent nausea or vomiting Anesthetic complications: no    Last Vitals:  Vitals:   09/13/18 1025 09/13/18 1035  BP: (!) 131/96 (!) 146/102  Pulse: 97 89  Resp: 19 19  Temp:    SpO2: 97% 97%    Last Pain:  Vitals:   09/13/18 1035  TempSrc:   PainSc: 0-No pain                 Jolynda Townley COKER

## 2018-09-13 NOTE — Anesthesia Procedure Notes (Signed)
Procedure Name: General with mask airway Date/Time: 09/13/2018 10:04 AM Performed by: Mariea Clonts, CRNA Pre-anesthesia Checklist: Patient identified, Timeout performed, Patient being monitored, Suction available and Emergency Drugs available Patient Re-evaluated:Patient Re-evaluated prior to induction Oxygen Delivery Method: Ambu bag Preoxygenation: Pre-oxygenation with 100% oxygen Induction Type: IV induction Ventilation: Mask ventilation without difficulty

## 2018-09-13 NOTE — Interval H&P Note (Signed)
History and Physical Interval Note:  09/13/2018 9:53 AM  Kerry Adams  has presented today for surgery, with the diagnosis of ATRIAL FIBRILLATION.  The various methods of treatment have been discussed with the patient and family. After consideration of risks, benefits and other options for treatment, the patient has consented to  Procedure(s): CARDIOVERSION (N/A) as a surgical intervention.  The patient's history has been reviewed, patient examined, no change in status, stable for surgery.  I have reviewed the patient's chart and labs.  Questions were answered to the patient's satisfaction.     Mertie Moores

## 2018-09-13 NOTE — CV Procedure (Signed)
    Cardioversion Note  KAMAL JURGENS [id number] 1955-03-20  Procedure: DC Cardioversion Indications: atrial fib   Procedure Details Consent: Obtained Time Out: Verified patient identification, verified procedure, site/side was marked, verified correct patient position, special equipment/implants available, Radiology Safety Procedures followed,  medications/allergies/relevent history reviewed, required imaging and test results available.  Performed  The patient has been on adequate anticoagulation.  The patient received IV Lidocaine 20 mg followed by Propofol 160 mg IV  for sedation.  Synchronous cardioversion was performed at 200  joules.  The cardioversion was successful.      Complications: No apparent complications Patient did not tolerate procedure well.   Thayer Headings, Brooke Bonito., MD, [facility] 09/13/2018, 10:17 AM

## 2018-09-13 NOTE — Discharge Instructions (Signed)

## 2018-09-13 NOTE — Interval H&P Note (Signed)
History and Physical Interval Note:  09/13/2018 9:59 AM  Kerry Adams  has presented today for surgery, with the diagnosis of ATRIAL FIBRILLATION.  The various methods of treatment have been discussed with the patient and family. After consideration of risks, benefits and other options for treatment, the patient has consented to  Procedure(s): CARDIOVERSION (N/A) as a surgical intervention.  The patient's history has been reviewed, patient examined, no change in status, stable for surgery.  I have reviewed the patient's chart and labs.  Questions were answered to the patient's satisfaction.     Kerry Adams

## 2018-09-13 NOTE — Anesthesia Preprocedure Evaluation (Addendum)
Anesthesia Evaluation  Patient identified by MRN, date of birth, ID band Patient awake    Reviewed: Allergy & Precautions, NPO status , Patient's Chart, lab work & pertinent test results  Airway Mallampati: III  TM Distance: >3 FB Neck ROM: Full    Dental no notable dental hx. (+) Teeth Intact   Pulmonary neg pulmonary ROS,    Pulmonary exam normal breath sounds clear to auscultation       Cardiovascular hypertension, Pt. on medications + CAD  + dysrhythmias Atrial Fibrillation  Rhythm:Irregular Rate:Tachycardia     Neuro/Psych negative neurological ROS  negative psych ROS   GI/Hepatic GERD  ,(+) Cirrhosis     substance abuse  , Hepatitis -  Endo/Other  negative endocrine ROS  Renal/GU negative Renal ROS     Musculoskeletal  (+) Arthritis , Osteoarthritis,    Abdominal   Peds  Hematology HLD Thrombocytopenia    Anesthesia Other Findings ATRIAL FIBRILLATION  Reproductive/Obstetrics                           Anesthesia Physical Anesthesia Plan  ASA: IV  Anesthesia Plan: General   Post-op Pain Management:    Induction: Intravenous  PONV Risk Score and Plan: 2 and Propofol infusion and Treatment may vary due to age or medical condition  Airway Management Planned: Mask  Additional Equipment:   Intra-op Plan:   Post-operative Plan:   Informed Consent: I have reviewed the patients History and Physical, chart, labs and discussed the procedure including the risks, benefits and alternatives for the proposed anesthesia with the patient or authorized representative who has indicated his/her understanding and acceptance.     Dental advisory given  Plan Discussed with: CRNA and Anesthesiologist  Anesthesia Plan Comments:      Anesthesia Quick Evaluation

## 2018-09-13 NOTE — Transfer of Care (Signed)
Immediate Anesthesia Transfer of Care Note  Patient: Kerry Adams  Procedure(s) Performed: CARDIOVERSION (N/A )  Patient Location: Endoscopy Unit  Anesthesia Type:General  Level of Consciousness: awake, alert  and oriented  Airway & Oxygen Therapy: Patient Spontanous Breathing  Post-op Assessment: Report given to RN, Post -op Vital signs reviewed and stable and Patient moving all extremities X 4  Post vital signs: Reviewed and stable  Last Vitals:  Vitals Value Taken Time  BP    Temp    Pulse    Resp    SpO2      Last Pain:  Vitals:   09/13/18 0900  TempSrc: Oral  PainSc: 0-No pain         Complications: No apparent anesthesia complications

## 2018-09-14 ENCOUNTER — Encounter (HOSPITAL_COMMUNITY): Payer: Self-pay | Admitting: Cardiovascular Disease

## 2018-10-06 ENCOUNTER — Telehealth: Payer: Self-pay | Admitting: *Deleted

## 2018-10-06 ENCOUNTER — Other Ambulatory Visit (INDEPENDENT_AMBULATORY_CARE_PROVIDER_SITE_OTHER): Payer: Self-pay | Admitting: *Deleted

## 2018-10-06 DIAGNOSIS — I4819 Other persistent atrial fibrillation: Secondary | ICD-10-CM

## 2018-10-06 NOTE — Telephone Encounter (Signed)
-----   Message from Liliane Shi, [place] sent at 10/06/2018  8:45 AM EDT ----- Regarding: Telemed visit Friday Can you see if he can come in for an ECG today or tomorrow so we can confirm if he is in AFib or normal sinus rhythm? Kerry Adams

## 2018-10-06 NOTE — Telephone Encounter (Signed)
S/w pt and pt's wife.  Is aware to come in today for EKG between 11:30 - 1:45.  Pt has a upcoming [place] visit on Friday with Richardson Dopp, PA and pt is post cardioversion.  Will route to [place] to [place].      COVID-19 Pre-Screening Questions:  . In the past 7 to 10 days have you had a cough,  shortness of breath, headache, congestion, fever (100 or greater) body aches, chills, sore throat, or sudden loss of taste or sense of smell? NO .  Marland Kitchen Have you been around anyone with known Covid 19.NO .  Marland Kitchen Have you been around anyone who is awaiting Covid 19 test results in the past 7 to 10 days?NO .  Marland Kitchen Have you been around anyone who has been exposed to Covid 19, or has mentioned symptoms of Covid 19 within the past 7 to 10 days?NO  If you have any concerns/questions about symptoms patients report during screening (either on the phone or at threshold). Contact the provider seeing the patient or DOD for further guidance.  If neither are available contact a member of the leadership team.

## 2018-10-06 NOTE — Telephone Encounter (Signed)
EKG performed today, signed and reviewed by Richardson Dopp, and given to medical records to be scanned into patients chart for his upcoming virtual visit with Richardson Dopp on 10/08/18.

## 2018-10-07 ENCOUNTER — Telehealth: Payer: Self-pay

## 2018-10-07 NOTE — Progress Notes (Signed)
Virtual Visit via Video Note   This visit type was conducted due to national recommendations for restrictions regarding the COVID-19 Pandemic (e.g. social distancing) in an effort to limit this patient's exposure and mitigate transmission in our community.  Due to his co-morbid illnesses, this patient is at least at moderate risk for complications without adequate follow up.  This format is felt to be most appropriate for this patient at this time.  All issues noted in this document were discussed and addressed.  A limited physical exam was performed with this format.  Please refer to the patient's chart for his consent to telehealth for [facility].   Date:  10/08/2018   ID:  Domenick, Quebedeaux 11-May-1955, MRN [id number]  Patient Location: Home Provider Location: Home  PCP:  Vicenta Aly, [place]  Cardiologist:  Dorris Carnes, MD   Electrophysiologist:  None   Evaluation Performed:  Follow-Up Visit  Chief Complaint: Follow-up on atrial fibrillation, status post cardioversion  History of Present Illness:    ARNELL SLIVINSKI is a 63 y.o. male with:  Coronary artery disease   CT 2014: Ca score 784  Myoview 2014: no ischemia  Persistent atrial fibrillation  Echocardiogram 5/20: EF 60-65  Rivaroxaban   Hypertension  Hyperlipidemia  Alcoholic cirrhosis  Thrombocytopenia  Mr. Dobie was last seen by Dr. Harrington Challenger August 19, 2018.  He was set up for cardioversion, which took place on 09/13/2018.  A follow up ecg 10/06/2018 showed normal sinus rhythm.  Today, he is doing well.  He really had no symptoms of atrial fibrillation.  He has not felt any different since the cardioversion.  He has not had chest pain, shortness of breath, dizziness.  He has not had melena, hematochezia, hematuria.  The patient does not have symptoms concerning for COVID-19 infection (fever, chills, cough, or new shortness of breath).    Past Medical History:  Diagnosis Date  . Abnormal EKG   . Alcohol  use 02/18/2014  . Arthritis    osteo knees and hips   . CAD (coronary artery disease)    WITHOUT ANGINA PECTORIS, UNSPECIFIED VESSEL OR LESION TYPE, UNSPECIFIED WHETHER NATIVE OR TRANSPLANTED HEART  . Chronic liver disease   . Cirrhosis ([place])   . Closed right hip fracture ([place]) 02/18/2014  . Dizziness   . Elevated liver function tests   . GERD (gastroesophageal reflux disease)    treats with OTC Ranitidine  . Hip fracture requiring operative repair ([place]) 02/19/2014  . Hyperlipidemia   . Hypertension   . Osteoarthritis of knee, unilateral 03/01/2015  . Primary osteoarthritis of right knee 05/23/2015  . Thrombocytopenia ([place])    Past Surgical History:  Procedure Laterality Date  . CARDIOVERSION N/A 09/13/2018   Procedure: CARDIOVERSION;  Surgeon: Acie Fredrickson Wonda Cheng, MD;  Location: [facility] ENDOSCOPY;  Service: Cardiovascular;  Laterality: N/A;  . FRACTURE SURGERY     right femur  . HERNIA REPAIR     umbilical  . INTERTROCHANTERIC HIP FRACTURE SURGERY Right 02/19/2014  . INTRAMEDULLARY (IM) NAIL INTERTROCHANTERIC Right 02/19/2014   Procedure: INTRAMEDULLARY (IM) NAIL INTERTROCHANTRIC;  Surgeon: [name], MD;  Location: [place];  Service: Orthopedics;  Laterality: Right;  . SHOULDER SURGERY Right   . TOTAL KNEE ARTHROPLASTY Left 03/01/2015   Procedure: TOTAL KNEE ARTHROPLASTY;  Surgeon: [name], MD;  Location: [place];  Service: Orthopedics;  Laterality: Left;  Left knee arthroplasty  . TOTAL KNEE ARTHROPLASTY Right 05/23/2015   Procedure: TOTAL KNEE ARTHROPLASTY W/HARDWARE REMOVAL FEMUR;  Surgeon: Frederik Pear, MD;  Location: [place];  Service: Orthopedics;  Laterality: Right;     Current Meds  Medication Sig  . acetaminophen (TYLENOL) 500 MG tablet Take 500 mg by mouth every 8 (eight) hours as needed (pain).  Marland Kitchen amLODipine (NORVASC) 5 MG tablet Take 1 tablet (5 mg total) by mouth daily.  Marland Kitchen amoxicillin (AMOXIL) 500 MG capsule Take 2,000 mg by mouth as directed. Before dental  work  . famotidine (PEPCID) 10 MG tablet Take 10 mg by mouth every morning.   . Potassium 99 MG TABS Take 99 mg by mouth daily.  . Pseudoeph-Doxylamine-DM-APAP (NYQUIL MULTI-SYMPTOM PO) Take 1 capsule by mouth at bedtime.  . rivaroxaban (XARELTO) 20 MG TABS tablet Take 1 tablet (20 mg total) by mouth daily with supper.  . trolamine salicylate (ASPERCREME) 10 % cream Apply 1 application topically as needed for muscle pain.     Allergies:   Patient has no known allergies.   Social History   Tobacco Use  . Smoking status: Never Smoker  . Smokeless tobacco: Never Used  Substance Use Topics  . Alcohol use: Yes    Alcohol/week: 32.0 standard drinks    Types: 32 Cans of beer per week    Comment: 6 beers /day  . Drug use: No     Family Hx: The patient's family history includes Healthy in his brother; Hypertension in his mother; Stroke in his father.  ROS:   Please see the history of present illness.    All other systems reviewed and are negative.   Prior CV studies:   The following studies were reviewed today:  Echo 07/20/2018 EF 60-65, mild LVH, normal RV SF, severe BAE, MAC  Cardiac monitor 06/2018 Atrial fibrillation  Occasional PVCs  Longest NSVT was 4 beats  Myoview 06/09/2012 EF 63, diaphragmatic attenuation, no ischemia, low risk  Cardiac CT 05/05/12 [facility])    Labs/Other Tests and Data Reviewed:    EKG:  An ECG dated 10/06/2018 was personally reviewed today and demonstrated:  Normal sinus rhythm, heart rate 77, normal axis, septal Q waves, QTC 434, borderline LVH  Recent Labs: 07/14/2018: TSH 2.790 07/23/2018: ALT 51 09/10/2018: BUN 9; Creatinine, Ser 0.62; Hemoglobin 15.5; Platelets 140; Potassium 4.4; Sodium 136   Recent Lipid Panel Lab Results  Component Value Date/Time   CHOL 223 (H) 07/14/2018 08:49 AM   TRIG 64 07/14/2018 08:49 AM   HDL 100 07/14/2018 08:49 AM   CHOLHDL 2.2 07/14/2018 08:49 AM   CHOLHDL 1 07/22/2012 07:59 AM   LDLCALC 110 (H)  07/14/2018 08:49 AM   LDLDIRECT 58.5 07/22/2012 07:59 AM     Wt Readings from Last 3 Encounters:  10/08/18 204 lb (92.5 kg)  09/13/18 211 lb (95.7 kg)  08/19/18 211 lb (95.7 kg)     Objective:    Vital Signs:  BP 128/89   Pulse 87   Ht _0  (1.854 m)   Wt 204 lb (92.5 kg)   BMI 26.91 kg/m    VITAL SIGNS:  reviewed GEN:  no acute distress RESPIRATORY:  Normal respiratory effort NEURO:  alert and oriented x 3, no obvious focal deficit PSYCH:  normal affect  ASSESSMENT & PLAN:    1. Persistent atrial fibrillation ECG 2 days ago demonstrated normal sinus rhythm.  He really had no symptoms with atrial fibrillation.  If he has recurrent atrial fibrillation, we can probably pursue a rate control strategy.  CHADS2-VASc=2 (HTN, CAD).  He will be 64 in May 2021.  At that point, his score would be 3.  His stroke risk is significant enough to continue on long-term anticoagulation.  He does have a history of cirrhosis which could put him at increased risk of bleeding.  I do not think he has had an EGD in the past.  It may be worthwhile to go to gastroenterology for evaluation.  If he has evidence of esophageal varices in the future, we may need to reconsider long-term anticoagulation therapy.  Follow-up in 6 months with me or Dr. Harrington Challenger.  2. Coronary artery disease involving native coronary artery of native heart without angina pectoris High calcium score on CT in 2014.  Myoview was negative for ischemia.  He has not had anginal symptoms.  He is not on aspirin as he is on Rivaroxaban.  3. Essential hypertension The patient's blood pressure is controlled on his current regimen.  Continue current therapy.   4. Alcoholic cirrhosis, unspecified whether ascites present ([place]) He drinks about 6 beers a day and 1 shot of liquor.  As noted, it may be worthwhile to refer him to gastroenterology to assess for esophageal varices.  Recent platelet count was stable.  5. Educated About Covid-19 Virus  Infection The importance of social distancing and wearing facial coverings was discussed today.  Time:   Today, I have spent 16 minutes with the patient with telehealth technology discussing the above problems.     Medication Adjustments/Labs and Tests Ordered: Current medicines are reviewed at length with the patient today.  Concerns regarding medicines are outlined above.   Tests Ordered: No orders of the defined types were placed in this encounter.   Medication Changes: No orders of the defined types were placed in this encounter.   Follow Up:  Virtual Visit or In Person in 6 month(s)  Signed, Richardson Dopp, PA-C  10/08/2018 2:05 PM    [place]

## 2018-10-07 NOTE — Telephone Encounter (Signed)
MEDS REVIEWED  Virtual Visit Pre-Appointment Phone Call  "(Name), I am calling you today to discuss your upcoming appointment. We are currently trying to limit exposure to the virus that causes COVID-19 by seeing patients at home rather than in the office."  1. "What is the BEST phone number to call the day of the visit?" - include this in appointment notes  2. "Do you have or have access to (through a family member/friend) a smartphone with video capability that we can use for your visit?" a. If yes - list this number in appt notes as "cell" (if different from BEST phone #) and list the appointment type as a VIDEO visit in appointment notes b. If no - list the appointment type as a PHONE visit in appointment notes  3. Confirm consent - "In the setting of the current Covid19 crisis, you are scheduled for a (phone or video) visit with your provider on (date) at (time).  Just as we do with many in-office visits, in order for you to participate in this visit, we must obtain consent.  If you'd like, I can send this to your mychart (if signed up) or email for you to review.  Otherwise, I can obtain your verbal consent now.  All virtual visits are billed to your insurance company just like a normal visit would be.  By agreeing to a virtual visit, we'd like you to understand that the technology does not allow for your provider to perform an examination, and thus may limit your provider's ability to fully assess your condition. If your provider identifies any concerns that need to be evaluated in person, we will make arrangements to do so.  Finally, though the technology is pretty good, we cannot assure that it will always work on either your or our end, and in the setting of a video visit, we may have to convert it to a phone-only visit.  In either situation, we cannot ensure that we have a secure connection.  Are you willing to proceed?" STAFF: Did the patient verbally acknowledge consent to telehealth  visit? Document YES/NO here: YES  4. Advise patient to be prepared - "Two hours prior to your appointment, go ahead and check your blood pressure, pulse, oxygen saturation, and your weight (if you have the equipment to check those) and write them all down. When your visit starts, your provider will ask you for this information. If you have an Apple Watch or Kardia device, please plan to have heart rate information ready on the day of your appointment. Please have a pen and paper handy nearby the day of the visit as well."  5. Give patient instructions for MyChart download to smartphone OR Doximity/Doxy.me as below if video visit (depending on what platform provider is using)  6. Inform patient they will receive a phone call 15 minutes prior to their appointment time (may be from unknown caller ID) so they should be prepared to answer    TELEPHONE CALL NOTE  Kerry Adams has been deemed a candidate for a follow-up tele-health visit to limit community exposure during the Covid-19 pandemic. I spoke with the patient via phone to ensure availability of phone/video source, confirm preferred email & phone number, and discuss instructions and expectations.  I reminded Kerry Adams to be prepared with any vital sign and/or heart rhythm information that could potentially be obtained via home monitoring, at the time of his visit. I reminded Kerry Adams to expect a phone  call prior to his visit.  Mendel Ryder, [place] 10/07/2018 10:34 AM   INSTRUCTIONS FOR DOWNLOADING THE MYCHART APP TO SMARTPHONE  - The patient must first make sure to have activated MyChart and know their login information - If Apple, go to [employer] and type in MyChart in the search bar and download the app. If Android, ask patient to go to [employer] and type in [place] in the search bar and download the app. The app is free but as with any other app downloads, their phone may require them to verify saved payment  information or Apple/Android password.  - The patient will need to then log into the app with their MyChart username and password, and select [facility] as their healthcare provider to link the account. When it is time for your visit, go to the MyChart app, find appointments, and click Begin Video Visit. Be sure to Select Allow for your device to access the Microphone and Camera for your visit. You will then be connected, and your provider will be with you shortly.  **If they have any issues connecting, or need assistance please contact MyChart service desk [phone])**  **If using a computer, in order to ensure the best quality for their visit they will need to use either of the following Internet Browsers: [employer], or Google Chrome**  IF USING DOXIMITY or DOXY.ME - The patient will receive a link just prior to their visit by text.     FULL LENGTH CONSENT FOR TELE-HEALTH VISIT   I hereby voluntarily request, consent and authorize [facility] and its employed or contracted physicians, physician assistants, nurse practitioners or other licensed health care professionals (the Practitioner), to provide me with telemedicine health care services (the "Services") as deemed necessary by the treating Practitioner. I acknowledge and consent to receive the Services by the Practitioner via telemedicine. I understand that the telemedicine visit will involve communicating with the Practitioner through live audiovisual communication technology and the disclosure of certain medical information by electronic transmission. I acknowledge that I have been given the opportunity to request an in-person assessment or other available alternative prior to the telemedicine visit and am voluntarily participating in the telemedicine visit.  I understand that I have the right to withhold or withdraw my consent to the use of telemedicine in the course of my care at any time, without affecting my right  to future care or treatment, and that the Practitioner or I may terminate the telemedicine visit at any time. I understand that I have the right to inspect all information obtained and/or recorded in the course of the telemedicine visit and may receive copies of available information for a reasonable fee.  I understand that some of the potential risks of receiving the Services via telemedicine include:  Marland Kitchen Delay or interruption in medical evaluation due to technological equipment failure or disruption; . Information transmitted may not be sufficient (e.g. poor resolution of images) to allow for appropriate medical decision making by the Practitioner; and/or  . In rare instances, security protocols could fail, causing a breach of personal health information.  Furthermore, I acknowledge that it is my responsibility to provide information about my medical history, conditions and care that is complete and accurate to the best of my ability. I acknowledge that Practitioner's advice, recommendations, and/or decision may be based on factors not within their control, such as incomplete or inaccurate data provided by me or distortions of diagnostic images or specimens that may result  from electronic transmissions. I understand that the practice of medicine is not an exact science and that Practitioner makes no warranties or guarantees regarding treatment outcomes. I acknowledge that I will receive a copy of this consent concurrently upon execution via email to the email address I last provided but may also request a printed copy by calling the office of [facility].    I understand that my insurance will be billed for this visit.   I have read or had this consent read to me. . I understand the contents of this consent, which adequately explains the benefits and risks of the Services being provided via telemedicine.  . I have been provided ample opportunity to ask questions regarding this consent and the Services  and have had my questions answered to my satisfaction. . I give my informed consent for the services to be provided through the use of telemedicine in my medical care  By participating in this telemedicine visit I agree to the above.

## 2018-10-08 ENCOUNTER — Other Ambulatory Visit: Payer: Self-pay

## 2018-10-08 ENCOUNTER — Telehealth: Payer: Self-pay

## 2018-10-08 ENCOUNTER — Telehealth (INDEPENDENT_AMBULATORY_CARE_PROVIDER_SITE_OTHER): Payer: Self-pay | Admitting: Physician Assistant

## 2018-10-08 ENCOUNTER — Encounter: Payer: Self-pay | Admitting: Physician Assistant

## 2018-10-08 VITALS — BP 128/89 | HR 87 | Ht 73.0 in | Wt 204.0 lb

## 2018-10-08 DIAGNOSIS — I4819 Other persistent atrial fibrillation: Secondary | ICD-10-CM

## 2018-10-08 DIAGNOSIS — I10 Essential (primary) hypertension: Secondary | ICD-10-CM

## 2018-10-08 DIAGNOSIS — I2510 Atherosclerotic heart disease of native coronary artery without angina pectoris: Secondary | ICD-10-CM

## 2018-10-08 DIAGNOSIS — Z7189 Other specified counseling: Secondary | ICD-10-CM

## 2018-10-08 DIAGNOSIS — K7030 Alcoholic cirrhosis of liver without ascites: Secondary | ICD-10-CM

## 2018-10-08 NOTE — Telephone Encounter (Signed)
-----   Message from Liliane Shi, [place] sent at 10/08/2018  9:37 AM EDT ----- Regarding: Telehealth visit today Good morning! Can you help with this patient? He is self pay - will not have medicare until 06/2019. He submitted some application for Xarelto and was told he makes too much money. Cost is $500/month.   Can you see if we can get him any other assistance, samples, etc for Xarelto? Or, if we can get him Eliquis or Pradaxa? Thank you! Richardson Dopp, PA-C    10/08/2018 9:37 AM

## 2018-10-08 NOTE — Patient Instructions (Signed)
Medication Instructions:  Your physician recommends that you continue on your current medications as directed. Please refer to the Current Medication list given to you today.  If you need a refill on your cardiac medications before your next appointment, please call your pharmacy.   Lab work: NONE If you have labs (blood work) drawn today and your tests are completely normal, you will receive your results only by: Marland Kitchen MyChart Message (if you have MyChart) OR . A paper copy in the mail If you have any lab test that is abnormal or we need to change your treatment, we will call you to review the results.  Testing/Procedures: NONE  Follow-Up: At [facility], you and your health needs are our priority.  As part of our continuing mission to provide you with exceptional heart care, we have created designated Provider Care Teams.  These Care Teams include your primary Cardiologist (physician) and Advanced Practice Providers (APPs -  Physician Assistants and Nurse Practitioners) who all work together to provide you with the care you need, when you need it. You will need a follow up appointment in:  6 months.  Please call our office 2 months in advance to schedule this appointment.  You may see Dorris Carnes, MD or Richardson Dopp, PA-C   Any Other Special Instructions Will Be Listed Below (If Applicable).

## 2018-10-08 NOTE — Telephone Encounter (Signed)
**Note De-Identified Kerry Adams Obfuscation** The pt came to the phone and asked me to discuss options for his Xarelto with his wife Arbie Cookey. Arbie Cookey states that they called [name] and [place] pt asst program and was advised that they earned too much for him to receive assistance through their program as they earn $54,000.00 and can only earn $51,000.00 a year to qualify.  I advised her that I am going to check the qualifications to see if maybe the pt will qualify for assistance with Eliquis or Pradaxa pt asst programs and that I will get back in touch with her.  She states that the pt has enough Xarelto to last until Tuesday 8/18. I advised her that if I have not called her back by Monday afternoon to call me as I dont want the pt to run out of Xarelto while we are working on this matter.  She verbalized understanding and thanked me for my help.

## 2018-10-11 ENCOUNTER — Ambulatory Visit: Payer: Self-pay | Admitting: Internal Medicine

## 2018-10-11 NOTE — Telephone Encounter (Signed)
**Note De-Identified [place] Obfuscation** I called the pts wife Arbie Cookey to advise that I found the income limit to apply for pt asst with [place] for Eliquis is $22,241 for a family of 2 and that I s/w Tricia at [employer] and she advised that the pt call them as there maybe other options they can offer if he earns to much to apply with them.  Arbie Cookey state that she will call them today and thanked me for my help. She is aware to call back if they decide to switch from Xarelto.

## 2018-10-12 ENCOUNTER — Telehealth: Payer: Self-pay | Admitting: Internal Medicine

## 2018-10-12 NOTE — Telephone Encounter (Signed)
Returned call to patient's wife. She states that after reviewing all of the information from our Prior Auth nurse that they have decided to switch the patient's Xarelto to Coumadin. She states that he has 2 pills left of his Xarelto. She states that the patient would like to be transitioned to coumadin and have his INR checks done with his PCP Vicenta Aly, FNP. She states that they have agreed to do it but will need orders from cardiology. Made wife aware that I will forward for review.

## 2018-10-12 NOTE — Telephone Encounter (Signed)
I am OK with patient switching to coumadin if that is what he wants Needs to have INR and CBC followed closely. His primary provider will need to order and then arrange for f/u in clnic

## 2018-10-12 NOTE — Telephone Encounter (Signed)
New message:     Patient wife calling concerning some medications please call back.

## 2018-10-13 NOTE — Telephone Encounter (Signed)
lmtb

## 2018-10-13 NOTE — Telephone Encounter (Signed)
Advised pt that Dr. Harrington Challenger is ok with him switching to coumadin and that he will need to contact his PCP for the order and follow up with Dr. Harrington Challenger. Pt agreeable.

## 2018-10-13 NOTE — Telephone Encounter (Signed)
Follow up  ° ° °Patient is returning call.  °

## 2019-03-31 NOTE — Progress Notes (Signed)
Virtual Visit via Telephone Note   This visit type was conducted due to national recommendations for restrictions regarding the COVID-19 Pandemic (e.g. social distancing) in an effort to limit this patient's exposure and mitigate transmission in our community.  Due to his co-morbid illnesses, this patient is at least at moderate risk for complications without adequate follow up.  This format is felt to be most appropriate for this patient at this time.  The patient did not have access to video technology/had technical difficulties with video requiring transitioning to audio format only (telephone).  All issues noted in this document were discussed and addressed.  No physical exam could be performed with this format.  Please refer to the patient's chart for his  consent to telehealth for [facility].   Date:  04/01/2019   ID:  Kerry Adams, DOB 27-Feb-1955, MRN [id number]  Patient Location: Home Provider Location: Office  PCP:  Kerry Adams, Kerry Adams  Cardiologist:  Kerry Carnes, Kerry Adams  Electrophysiologist:  None   F/U visit for PAFChief Complaint:   History of Present Illness:    Kerry Adams is a 64 y.o. male with a history of CAD (Ca score 59 in 2014; myovue normal), atrial fibrillation, HTN, HL, alcoholic cirrhosis and varices, thrombocytopenia I saw the pt in July 2020  He had cardioversion on July 20  Felt no difference after    He was seen by Kerry Adams as televisit after  The patient says he feels ok  Denies palpitations (he did not have before)  No SOB   NO CP    H thinks he is back in afib because hes BP cuff reads out irregular.   Deneis dizziness   The patient does not have symptoms concerning for COVID-19 infection (fever, chills, cough, or new shortness of breath).      Past Medical History:  Diagnosis Date  . Abnormal EKG   . Alcohol use 02/18/2014  . Arthritis    osteo knees and hips   . CAD (coronary artery disease)    WITHOUT ANGINA PECTORIS, UNSPECIFIED VESSEL  OR LESION TYPE, UNSPECIFIED WHETHER NATIVE OR TRANSPLANTED HEART  . Chronic liver disease   . Cirrhosis ([place])   . Closed right hip fracture ([place]) 02/18/2014  . Dizziness   . Elevated liver function tests   . GERD (gastroesophageal reflux disease)    treats with OTC Ranitidine  . Hip fracture requiring operative repair ([place]) 02/19/2014  . Hyperlipidemia   . Kerry Adams   . Osteoarthritis of knee, unilateral 03/01/2015  . Primary osteoarthritis of right knee 05/23/2015  . Thrombocytopenia ([place])    Past Surgical History:  Procedure Laterality Date  . CARDIOVERSION N/A 09/13/2018   Procedure: CARDIOVERSION;  Surgeon: Kerry Fredrickson Kerry Cheng, Kerry Adams;  Location: [facility] ENDOSCOPY;  Service: Cardiovascular;  Laterality: N/A;  . FRACTURE SURGERY     right femur  . HERNIA REPAIR     umbilical  . INTERTROCHANTERIC HIP FRACTURE SURGERY Right 02/19/2014  . INTRAMEDULLARY (IM) NAIL INTERTROCHANTERIC Right 02/19/2014   Procedure: INTRAMEDULLARY (IM) NAIL INTERTROCHANTRIC;  Surgeon: Kerry Sells, Kerry Adams;  Location: [facility];  Service: Orthopedics;  Laterality: Right;  . SHOULDER SURGERY Right   . TOTAL KNEE ARTHROPLASTY Left 03/01/2015   Procedure: TOTAL KNEE ARTHROPLASTY;  Surgeon: Kerry Ade, Kerry Adams;  Location: [facility];  Service: Orthopedics;  Laterality: Left;  Left knee arthroplasty  . TOTAL KNEE ARTHROPLASTY Right 05/23/2015   Procedure: TOTAL KNEE ARTHROPLASTY W/HARDWARE REMOVAL FEMUR;  Surgeon: Kerry Pear, Kerry Adams;  Location:  [facility] OR;  Service: Orthopedics;  Laterality: Right;     Current Meds  Medication Sig  . acetaminophen (TYLENOL) 500 MG tablet Take 500 mg by mouth every 8 (eight) hours as needed (pain).  Marland Kitchen amLODipine (NORVASC) 5 MG tablet Take 1 tablet (5 mg total) by mouth daily.  Marland Kitchen amoxicillin (AMOXIL) 500 MG capsule Take 2,000 mg by mouth as directed. Before dental work  . famotidine (PEPCID) 10 MG tablet Take 10 mg by mouth every morning.   . Potassium 99 MG TABS Take 99 mg by mouth daily.  .  Pseudoeph-Doxylamine-DM-APAP (NYQUIL MULTI-SYMPTOM PO) Take 1 capsule by mouth at bedtime.  . rivaroxaban (XARELTO) 20 MG TABS tablet Take 1 tablet (20 mg total) by mouth daily with supper.  . trolamine salicylate (ASPERCREME) 10 % cream Apply 1 application topically as needed for muscle pain.     Allergies:   Patient has no known allergies.   Social History   Tobacco Use  . Smoking status: Never Smoker  . Smokeless tobacco: Never Used  Substance Use Topics  . Alcohol use: Yes    Alcohol/week: 32.0 standard drinks    Types: 32 Cans of beer per week    Comment: 6 beers /day  . Drug use: No     Family Hx: The patient's family history includes Kerry Adams in his brother; Kerry Adams in his mother; Kerry Adams in his father.  ROS:   Please see the history of present illness.    All other systems reviewed and are negative.   Prior CV studies:   The following studies were reviewed today:   Labs/Other Tests and Data Reviewed:    EKG:  No ECG reviewed.  Recent Labs: 07/14/2018: TSH 2.790 07/23/2018: ALT 51 09/10/2018: BUN 9; Creatinine, Ser 0.62; Hemoglobin 15.5; Platelets 140; Potassium 4.4; Sodium 136   Recent Lipid Panel Lab Results  Component Value Date/Time   CHOL 223 (H) 07/14/2018 08:49 AM   TRIG 64 07/14/2018 08:49 AM   HDL 100 07/14/2018 08:49 AM   CHOLHDL 2.2 07/14/2018 08:49 AM   CHOLHDL 1 07/22/2012 07:59 AM   LDLCALC 110 (H) 07/14/2018 08:49 AM   LDLDIRECT 58.5 07/22/2012 07:59 AM    Wt Readings from Last 3 Encounters:  04/01/19 200 lb (90.7 kg)  10/08/18 204 lb (92.5 kg)  09/13/18 211 lb (95.7 kg)     Objective:    Vital Signs:  BP 124/79   Pulse (!) 110   Ht 6\' 1"  (1.854 m)   Wt 200 lb (90.7 kg)   BMI 26.39 kg/m    VITAL SIGNS:  reviewed  ASSESSMENT & PLAN:    1. Atrial fibrillation  Pt found to be in afib last year  Cardioverted  In July   Now he feels like he is backin afib because BP cuff reads irregular.   Pt is asymtpoamtic otherwise.    I  would recomm a monitor to see how his 24 hour rate control is  A monitor last spring showed his HR was 109 on average    Will also set up for CBC  2  CAD Pt with CAD on CT scan   Asymptomatic   3  HTN   BP is OK     4  HL  Continue statin  5   EtOH abuse with cirrhosis.  Pt continues to drink (12 pack per day + liquor)  Counselled on dangers.  It probably contributed to return to afib   Need to wtch CBC closely  6  COVID  Pt being cautious his wife says   Will get vaccine when available       COVID-19 Education: The signs and symptoms of COVID-19 were discussed with the patient and how to seek care for testing (follow up with PCP or arrange E-visit).  The importance of social distancing was discussed today.  Time:   Today, I have spent 25 minutes with the patient with telehealth technology discussing the above problems.     Medication Adjustments/Labs and Tests Ordered: Current medicines are reviewed at length with the patient today.  Concerns regarding medicines are outlined above.   Tests Ordered: No orders of the defined types were placed in this encounter.   Medication Changes: No orders of the defined types were placed in this encounter.   Follow Up:  In Person July  Signed, Kerry Carnes, Kerry Adams  04/01/2019 11:06 AM    [place]

## 2019-04-01 ENCOUNTER — Other Ambulatory Visit: Payer: Self-pay

## 2019-04-01 ENCOUNTER — Encounter: Payer: Self-pay | Admitting: Internal Medicine

## 2019-04-01 ENCOUNTER — Telehealth (INDEPENDENT_AMBULATORY_CARE_PROVIDER_SITE_OTHER): Payer: BC Managed Care – PPO | Admitting: Internal Medicine

## 2019-04-01 VITALS — BP 124/79 | HR 110 | Ht 73.0 in | Wt 200.0 lb

## 2019-04-01 DIAGNOSIS — I4819 Other persistent atrial fibrillation: Secondary | ICD-10-CM

## 2019-04-01 NOTE — Patient Instructions (Signed)
Medication Instructions:  Your physician recommends that you continue on your current medications as directed. Please refer to the Current Medication list given to you today.  *If you need a refill on your cardiac medications before your next appointment, please call your pharmacy*  Lab Work: CBC in the near future.  Please contact [facility] to see if appointment is required.   If you have labs (blood work) drawn today and your tests are completely normal, you will receive your results only by: Marland Kitchen MyChart Message (if you have MyChart) OR . A paper copy in the mail If you have any lab test that is abnormal or we need to change your treatment, we will call you to review the results.  Testing/Procedures: Your physician recommends that you wear a 3 day monitor.   Follow-Up: At [facility], you and your health needs are our priority.  As part of our continuing mission to provide you with exceptional heart care, we have created designated Provider Care Teams.  These Care Teams include your primary Cardiologist (physician) and Advanced Practice Providers (APPs -  Physician Assistants and Nurse Practitioners) who all work together to provide you with the care you need, when you need it.  Your next appointment:   5 month(s)  The format for your next appointment:   In Person  Provider:   You may see Dorris Carnes, MD or one of the following Advanced Practice Providers on your designated Care Team:    Richardson Dopp, PA-C  [place], [place]  Daune Perch, NP   Other Instructions

## 2019-04-05 ENCOUNTER — Telehealth: Payer: Self-pay | Admitting: Internal Medicine

## 2019-04-05 DIAGNOSIS — I4819 Other persistent atrial fibrillation: Secondary | ICD-10-CM

## 2019-04-05 NOTE — Telephone Encounter (Signed)
New message  Per patient [facility] at [facility] in [place], [place] needs the lab order faxed to them at [phone]. Patient is at the [facility] now.

## 2019-04-05 NOTE — Telephone Encounter (Signed)
CBC order released to [facility] at this time.  Attempted call back to reach them.  Had to leave a VM that order has been released.

## 2019-04-08 NOTE — Telephone Encounter (Signed)
Spoke with patient's wife. He did not stay at [facility] go back next week for lab draw (CBC) New order placed and released.

## 2019-04-11 ENCOUNTER — Ambulatory Visit (INDEPENDENT_AMBULATORY_CARE_PROVIDER_SITE_OTHER): Payer: BC Managed Care – PPO

## 2019-04-11 DIAGNOSIS — I4819 Other persistent atrial fibrillation: Secondary | ICD-10-CM

## 2019-04-19 LAB — CBC
Hematocrit: 37.7 % (ref 37.5–51.0)
Hemoglobin: 12.1 g/dL — ABNORMAL LOW (ref 13.0–17.7)
MCH: 29.3 pg (ref 26.6–33.0)
MCHC: 32.1 g/dL (ref 31.5–35.7)
MCV: 91 fL (ref 79–97)
Platelets: 203 10*3/uL (ref 150–450)
RBC: 4.13 x10E6/uL — ABNORMAL LOW (ref 4.14–5.80)
RDW: 15.7 % — ABNORMAL HIGH (ref 11.6–15.4)
WBC: 5.9 10*3/uL (ref 3.4–10.8)

## 2019-04-20 ENCOUNTER — Telehealth: Payer: Self-pay | Admitting: *Deleted

## 2019-04-20 DIAGNOSIS — R7989 Other specified abnormal findings of blood chemistry: Secondary | ICD-10-CM

## 2019-04-20 DIAGNOSIS — I2510 Atherosclerotic heart disease of native coronary artery without angina pectoris: Secondary | ICD-10-CM

## 2019-04-20 DIAGNOSIS — I4819 Other persistent atrial fibrillation: Secondary | ICD-10-CM

## 2019-04-20 NOTE — Telephone Encounter (Signed)
-----   Message from Fay Records, MD sent at 04/19/2019  9:58 PM EST ----- Hgb is down from last check Would recomm an anemia panel.    Please forward this to patient's PCP   WIll need close f/u since he is on anticoagulation

## 2019-04-20 NOTE — Telephone Encounter (Signed)
Spoke with patient's wife. Aware of results and recommendations. He has medicare wellness check on 06/25/19 and is asking if can wait until then to be seen.  Adv to discuss with PCP as to when he can come in but that CBC needs watched closely.  Will send order for anemia panel to [facility].  Aware I will forward all results to PCP.

## 2019-04-25 ENCOUNTER — Telehealth: Payer: Self-pay

## 2019-04-25 NOTE — Telephone Encounter (Signed)
[employer] calling to report monitor results.  Patient wore zio patch 2/15-2/18. Patient was in Afib 100%of the time. Patient did have some rapid rates with the highest being 197 bpm for 60 sec. Patient also had 10 runs of VT. Patient is anticoagulated. They will upload results to the site. Will forward to monitor tech to upload results.

## 2019-04-29 ENCOUNTER — Telehealth: Payer: Self-pay

## 2019-04-29 DIAGNOSIS — Z79899 Other long term (current) drug therapy: Secondary | ICD-10-CM

## 2019-04-29 DIAGNOSIS — I4819 Other persistent atrial fibrillation: Secondary | ICD-10-CM

## 2019-04-29 MED ORDER — METOPROLOL TARTRATE 25 MG PO TABS: 25.0000 mg | ORAL_TABLET | Freq: Two times a day (BID) | ORAL | 3 refills | Status: DC

## 2019-04-29 MED ORDER — MULTAQ 400 MG PO TABS: 400.0000 mg | ORAL_TABLET | Freq: Two times a day (BID) | ORAL | 3 refills | Status: DC

## 2019-04-29 NOTE — Telephone Encounter (Signed)
-----   Message from Fay Records, MD sent at 04/27/2019 10:47 PM EST ----- Patient's afib is not controlled   Rates high   Reviewed with EP Needs 1.  Metoprolol 25 bid    2. Multaq 400 bid (take with food) Needs BMET in 2 wks    F'U appt with me or  PA in 3 wks to see if rate slower or if in sinus

## 2019-04-29 NOTE — Telephone Encounter (Signed)
Spoke with the pts wife per his request re: his monitor results and she verbalized understanding of Dr. Harrington Challenger' recommendations..She reports she may not be able to pick up his meds until early next week due to some health issues she is also having. I urged her to start as soon as possible and she agreed.   I made him and appt with Richardson Dopp PA to follow up a bit longer than 3 weeks out to compensate for the time that she will picking up his meds which would not be until early next week, I also advised her to see if their pharmacy offers free delivery..since many retail pharmacies not offer that option.   She will call us if he develops any problems or difficulties with any symptoms or problems with the meds but will give him some time to adjust to them.

## 2019-05-05 ENCOUNTER — Telehealth: Payer: Self-pay | Admitting: Internal Medicine

## 2019-05-05 DIAGNOSIS — I4819 Other persistent atrial fibrillation: Secondary | ICD-10-CM

## 2019-05-05 NOTE — Telephone Encounter (Signed)
Wife of the patient called. She wanted to thank Michalene for the detailed voicemail and will do as recommended until she gets to speak with Michalene later.

## 2019-05-05 NOTE — Telephone Encounter (Signed)
Hold multaq I would keep on metoprolol See about appt with W [place] (soon)

## 2019-05-05 NOTE — Telephone Encounter (Signed)
Left detailed message on VM that pt should not take any more Multaq for now and he should be ok to continue metoprolol as long has his BP and HR are ok.  Asked that she call back so we can discuss that further.  Adv will forward to Dr. Harrington Challenger for further recommendations and then I will call her back.

## 2019-05-05 NOTE — Telephone Encounter (Signed)
  Pt c/o medication issue:  1. Name of Medication: dronedarone (MULTAQ) 400 MG tablet,   metoprolol tartrate (LOPRESSOR) 25 MG tablet   2. How are you currently taking this medication (dosage and times per day)? As written  3. Are you having a reaction (difficulty breathing--STAT)? Nausea, upset stomach, lethargy, sob when moving.  4. What is your medication issue? Pt's wife said, Pt started taking this 2 new meds Tuesday morning and right away pt shows reaction with the meds, he gets SOB while walking. She said last night he had troubles eating he gets nauseated and always feel weak. She wanted to speak with Dr. Harrington Challenger or her nurse.  Please call

## 2019-05-06 NOTE — Telephone Encounter (Signed)
Message to EP scheduling to arrange consult with Dr. Curt Bears. Order placed for referral.

## 2019-05-13 ENCOUNTER — Other Ambulatory Visit: Payer: Self-pay

## 2019-05-13 ENCOUNTER — Other Ambulatory Visit: Payer: BC Managed Care – PPO | Admitting: *Deleted

## 2019-05-13 DIAGNOSIS — Z79899 Other long term (current) drug therapy: Secondary | ICD-10-CM

## 2019-05-13 DIAGNOSIS — I4819 Other persistent atrial fibrillation: Secondary | ICD-10-CM

## 2019-05-13 LAB — BASIC METABOLIC PANEL
BUN/Creatinine Ratio: 9 — ABNORMAL LOW (ref 10–24)
BUN: 7 mg/dL — ABNORMAL LOW (ref 8–27)
CO2: 22 mmol/L (ref 20–29)
Calcium: 8.8 mg/dL (ref 8.6–10.2)
Chloride: 100 mmol/L (ref 96–106)
Creatinine, Ser: 0.77 mg/dL (ref 0.76–1.27)
GFR calc Af Amer: 111 mL/min/{1.73_m2} (ref >59)
GFR calc non Af Amer: 96 mL/min/{1.73_m2} (ref >59)
Glucose: 107 mg/dL — ABNORMAL HIGH (ref 65–99)
Potassium: 4.3 mmol/L (ref 3.5–5.2)
Sodium: 137 mmol/L (ref 134–144)

## 2019-05-16 ENCOUNTER — Telehealth: Payer: Self-pay | Admitting: Internal Medicine

## 2019-05-16 NOTE — Telephone Encounter (Signed)
Upcoming appointment cancelled.  05/25/19. Left VM that I have cancelled and also sent this information through MyChart portal.

## 2019-05-16 NOTE — Telephone Encounter (Signed)
Patient's wife states she is requesting to advise with Dr. Harrington Challenger in regards to whether or not appointment scheduled with Richardson Dopp for 05/25/19 is necessary. Please return call to advise.

## 2019-05-16 NOTE — Telephone Encounter (Signed)
This appointment was scheduled to follow up after med changes.  Pt did not tolerate the med changes and has been scheduled with EP on 05/31/19.

## 2019-05-16 NOTE — Telephone Encounter (Signed)
Yes, OK to cancel Richardson Dopp

## 2019-05-25 ENCOUNTER — Ambulatory Visit: Payer: BC Managed Care – PPO | Admitting: Physician Assistant

## 2019-05-25 ENCOUNTER — Telehealth: Payer: Self-pay | Admitting: Cardiology

## 2019-05-25 NOTE — Telephone Encounter (Signed)
Spoke with wife/pt. Pt with current ambulatory issues.  Wife made aware ok to assist pt to appt next week w/ Dr. Curt Bears. She appreciates the return call and our consideration in this matter.

## 2019-05-25 NOTE — Telephone Encounter (Signed)
Wife of the patient called. She would like permission from the office to be present during her Husband's Consultation with Dr. Curt Bears

## 2019-05-31 ENCOUNTER — Encounter: Payer: Self-pay | Admitting: Cardiology

## 2019-05-31 ENCOUNTER — Ambulatory Visit (INDEPENDENT_AMBULATORY_CARE_PROVIDER_SITE_OTHER): Payer: BC Managed Care – PPO | Admitting: Cardiology

## 2019-05-31 ENCOUNTER — Other Ambulatory Visit: Payer: Self-pay

## 2019-05-31 VITALS — BP 124/74 | HR 118 | Ht 73.0 in | Wt 200.6 lb

## 2019-05-31 DIAGNOSIS — I4819 Other persistent atrial fibrillation: Secondary | ICD-10-CM

## 2019-05-31 MED ORDER — METOPROLOL TARTRATE 50 MG PO TABS: 50.0000 mg | ORAL_TABLET | Freq: Two times a day (BID) | ORAL | 1 refills | Status: DC

## 2019-05-31 NOTE — Patient Instructions (Signed)
Medication Instructions:  Your physician has recommended you make the following change in your medication:  1. INCREASE Metoprolol Tartrate (Lopressor) to 50 mg twice daily  *If you need a refill on your cardiac medications before your next appointment, please call your pharmacy*   Lab Work: None ordered   Testing/Procedures: None ordered   Follow-Up: At [facility], you and your health needs are our priority.  As part of our continuing mission to provide you with exceptional heart care, we have created designated Provider Care Teams.  These Care Teams include your primary Cardiologist (physician) and Advanced Practice Providers (APPs -  Physician Assistants and Nurse Practitioners) who all work together to provide you with the care you need, when you need it.  We recommend signing up for the patient portal called "MyChart".  Sign up information is provided on this After Visit Summary.  MyChart is used to connect with patients for Virtual Visits (Telemedicine).  Patients are able to view lab/test results, encounter notes, upcoming appointments, etc.  Non-urgent messages can be sent to your provider as well.   To learn more about what you can do with MyChart, go to [email or web address].    Your next appointment:   3 month(s)  The format for your next appointment:   In Person  Provider:   Allegra Lai, MD   Thank you for choosing [facility]!!   Trinidad Curet, RN [phone]    Other Instructions Check your heart rates and send [place] information via MyChart    Cardiac Ablation Cardiac ablation is a procedure to disable (ablate) a small amount of heart tissue in very specific places. The heart has many electrical connections. Sometimes these connections are abnormal and can cause the heart to beat very fast or irregularly. Ablating some of the problem areas can improve the heart rhythm or return it to normal. Ablation may be done for people who:  Have  Wolff-Parkinson-White syndrome.  Have fast heart rhythms (tachycardia).  Have taken medicines for an abnormal heart rhythm (arrhythmia) that were not effective or caused side effects.  Have a high-risk heartbeat that may be life-threatening. During the procedure, a small incision is made in the neck or the groin, and a long, thin, flexible tube (catheter) is inserted into the incision and moved to the heart. Small devices (electrodes) on the tip of the catheter will send out electrical currents. A type of X-ray (fluoroscopy) will be used to help guide the catheter and to provide images of the heart. Tell a health care provider about:  Any allergies you have.  All medicines you are taking, including vitamins, herbs, eye drops, creams, and over-the-counter medicines.  Any problems you or family members have had with anesthetic medicines.  Any blood disorders you have.  Any surgeries you have had.  Any medical conditions you have, such as kidney failure.  Whether you are pregnant or may be pregnant. What are the risks? Generally, this is a safe procedure. However, problems may occur, including:  Infection.  Bruising and bleeding at the catheter insertion site.  Bleeding into the chest, especially into the sac that surrounds the heart. This is a serious complication.  Stroke or blood clots.  Damage to other structures or organs.  Allergic reaction to medicines or dyes.  Need for a permanent pacemaker if the normal electrical system is damaged. A pacemaker is a small computer that sends electrical signals to the heart and helps your heart beat normally.  The procedure not being fully effective.  This may not be recognized until months later. Repeat ablation procedures are sometimes required. What happens before the procedure?  Follow instructions from your health care provider about eating or drinking restrictions.  Ask your health care provider about: ? Changing or stopping  your regular medicines. This is especially important if you are taking diabetes medicines or blood thinners. ? Taking medicines such as aspirin and ibuprofen. These medicines can thin your blood. Do not take these medicines before your procedure if your health care provider instructs you not to.  Plan to have someone take you home from the hospital or clinic.  If you will be going home right after the procedure, plan to have someone with you for 24 hours. What happens during the procedure?  To lower your risk of infection: ? Your health care team will wash or sanitize their hands. ? Your skin will be washed with soap. ? Hair may be removed from the incision area.  An IV tube will be inserted into one of your veins.  You will be given a medicine to help you relax (sedative).  The skin on your neck or groin will be numbed.  An incision will be made in your neck or your groin.  A needle will be inserted through the incision and into a large vein in your neck or groin.  A catheter will be inserted into the needle and moved to your heart.  Dye may be injected through the catheter to help your surgeon see the area of the heart that needs treatment.  Electrical currents will be sent from the catheter to ablate heart tissue in desired areas. There are three types of energy that may be used to ablate heart tissue: ? Heat (radiofrequency energy). ? Laser energy. ? Extreme cold (cryoablation).  When the necessary tissue has been ablated, the catheter will be removed.  Pressure will be held on the catheter insertion area to prevent excessive bleeding.  A bandage (dressing) will be placed over the catheter insertion area. The procedure may vary among health care providers and hospitals. What happens after the procedure?  Your blood pressure, heart rate, breathing rate, and blood oxygen level will be monitored until the medicines you were given have worn off.  Your catheter insertion area  will be monitored for bleeding. You will need to lie still for a few hours to ensure that you do not bleed from the catheter insertion area.  Do not drive for 24 hours or as long as directed by your health care provider. Summary  Cardiac ablation is a procedure to disable (ablate) a small amount of heart tissue in very specific places. Ablating some of the problem areas can improve the heart rhythm or return it to normal.  During the procedure, electrical currents will be sent from the catheter to ablate heart tissue in desired areas. This information is not intended to replace advice given to you by your health care provider. Make sure you discuss any questions you have with your health care provider. Document Revised: 08/03/2017 Document Reviewed: 12/31/2015 Elsevier Patient Education  [place].    Dofetilide capsules What is this medicine? DOFETILIDE (doe FET il ide) is an antiarrhythmic drug. It helps make your heart beat regularly. This medicine also helps to slow rapid heartbeats. This medicine may be used for other purposes; ask your health care provider or pharmacist if you have questions. COMMON BRAND NAME(S): Tikosyn What should I tell my health care provider before I take this medicine?  They need to know if you have any of these conditions:  heart disease  history of irregular heartbeat  history of low levels of potassium or magnesium in the blood  kidney disease  liver disease  an unusual or allergic reaction to dofetilide, other medicines, foods, dyes, or preservatives  pregnant or trying to get pregnant  breast-feeding How should I use this medicine? Take this medicine by mouth with a glass of water. Follow the directions on the prescription label. Do not take with grapefruit juice. You can take it with or without food. If it upsets your stomach, take it with food. Take your medicine at regular intervals. Do not take it more often than directed. Do not  stop taking except on your doctor's advice. A special MedGuide will be given to you by the pharmacist with each prescription and refill. Be sure to read this information carefully each time. Talk to your pediatrician regarding the use of this medicine in children. Special care may be needed. Overdosage: If you think you have taken too much of this medicine contact a poison control center or emergency room at once. NOTE: This medicine is only for you. Do not share this medicine with others. What if I miss a dose? If you miss a dose, skip it. Take your next dose at the normal time. Do not take extra or 2 doses at the same time to make up for the missed dose. What may interact with this medicine? Do not take this medicine with any of the following medications:  cimetidine  cisapride  dolutegravir  dronedarone  hydrochlorothiazide  ketoconazole  megestrol  pimozide  prochlorperazine  thioridazine  trimethoprim  verapamil This medicine may also interact with the following medications:  amiloride  cannabinoids  certain antibiotics like erythromycin or clarithromycin  certain antiviral medicines for HIV or hepatitis  certain medicines for depression, anxiety, or psychotic disorders  digoxin  diltiazem  grapefruit juice  metformin  nefazodone  other medicines that prolong the QT interval (an abnormal heart rhythm)  quinine  triamterene  zafirlukast  ziprasidone This list may not describe all possible interactions. Give your health care provider a list of all the medicines, herbs, non-prescription drugs, or dietary supplements you use. Also tell them if you smoke, drink alcohol, or use illegal drugs. Some items may interact with your medicine. What should I watch for while using this medicine? Your condition will be monitored carefully while you are receiving this medicine. What side effects may I notice from receiving this medicine? Side effects that you  should report to your doctor or health care professional as soon as possible:  allergic reactions like skin rash, itching or hives, swelling of the face, lips, or tongue  breathing problems  chest pain or chest tightness  dizziness  signs and symptoms of a dangerous change in heartbeat or heart rhythm like chest pain; dizziness; fast or irregular heartbeat; palpitations; feeling faint or lightheaded, falls; breathing problems  signs and symptoms of electrolyte imbalance like severe diarrhea, unusual sweating, vomiting, loss of appetite, increased thirst  swelling of the ankles, legs, or feet  tingling, numbness in the hands or feet Side effects that usually do not require medical attention (report to your doctor or health care professional if they continue or are bothersome):  diarrhea  general ill feeling or flu-like symptoms  headache  nausea  trouble sleeping  stomach pain This list may not describe all possible side effects. Call your doctor for medical advice about side  effects. You may report side effects to FDA at 1-800-FDA-1088. Where should I keep my medicine? Keep out of the reach of children. Store at room temperature between 15 and 30 degrees C (59 and 86 degrees F). Throw away any unused medicine after the expiration date. NOTE: This sheet is a summary. It may not cover all possible information. If you have questions about this medicine, talk to your doctor, pharmacist, or health care provider.  2020 Elsevier/Gold Standard (2018-02-01 10:18:48)

## 2019-05-31 NOTE — Progress Notes (Signed)
Electrophysiology Office Note   Date:  05/31/2019   ID:  Kerry Adams, Kerry Adams 05-Jan-1956, MRN [id number]  PCP:  Vicenta Aly, FNP  Cardiologist:  Harrington Challenger Primary Electrophysiologist:  Jaylia Pettus Meredith Leeds, MD    Chief Complaint: AF   History of Present Illness: Kerry Adams is a 64 y.o. male who is being seen today for the evaluation of AF at the request of Kerry Records, MD. Presenting today for electrophysiology evaluation.  He has a history significant for alcohol abuse, coronary artery disease, cirrhosis, hypertension, hyperlipidemia, and persistent atrial fibrillation.  .  Today, he denies symptoms of palpitations, chest pain, shortness of breath, orthopnea, PND, lower extremity edema, claudication, dizziness, presyncope, syncope, bleeding, or neurologic sequela. The patient is tolerating medications without difficulties.  He wore a cardiac monitor which showed persistent atrial fibrillation and rapid rates.  He comes in today feeling well.  He does not have shortness of breath, weakness, or fatigue.  He states that his heart rates at home are between 100-120 based on his blood pressure cuff.  He does drink quite a bit, 12 beers and 2 shots a day.  He is currently being evaluated for cirrhosis.  This is down quite a bit from years ago.  He is able to do most of his daily activities without restriction.   Past Medical History:  Diagnosis Date  . Abnormal EKG   . Alcohol use 02/18/2014  . Arthritis    osteo knees and hips   . CAD (coronary artery disease)    WITHOUT ANGINA PECTORIS, UNSPECIFIED VESSEL OR LESION TYPE, UNSPECIFIED WHETHER NATIVE OR TRANSPLANTED HEART  . Chronic liver disease   . Cirrhosis ([place])   . Closed right hip fracture ([place]) 02/18/2014  . Dizziness   . Elevated liver function tests   . GERD (gastroesophageal reflux disease)    treats with OTC Ranitidine  . Hip fracture requiring operative repair ([place]) 02/19/2014  . Hyperlipidemia   . Hypertension   .  Osteoarthritis of knee, unilateral 03/01/2015  . Primary osteoarthritis of right knee 05/23/2015  . Thrombocytopenia ([place])    Past Surgical History:  Procedure Laterality Date  . CARDIOVERSION N/A 09/13/2018   Procedure: CARDIOVERSION;  Surgeon: Acie Fredrickson Wonda Cheng, MD;  [facility] END[facility]COPY;  Service: Cardiovascular;  Laterality: N/A;  . FRACTURE SURGERY     right femur  . HERNIA REPAIR     umbilical  . INTERTROCHANTERIC HIP FRACTURE SURGERY Right 02/19/2014  . INTRAMEDULLARY (IM) NAIL INTERTROCHANTERIC Right 02/19/2014   Procedure: INTRAMEDULLARY (IM) NAIL INTERTROCHANTRIC;  Surgeon: [name], MD;  Location: [facility];  Service: Orthopedics;  Laterality: Right;  . SHOULDER SURGERY Right   . TOTAL KNEE ARTHROPLASTY Left 03/01/2015   Procedure: TOTAL KNEE ARTHROPLASTY;  Surgeon: [name], MD;  Location: [facility];  Service: Orthopedics;  Laterality: Left;  Left knee arthroplasty  . TOTAL KNEE ARTHROPLASTY Right 05/23/2015   Procedure: TOTAL KNEE ARTHROPLASTY W/HARDWARE REMOVAL FEMUR;  Surgeon: Frederik Pear, MD;  Location: [facility];  Service: Orthopedics;  Laterality: Right;     Current Outpatient Medications  Medication Sig Dispense Refill  . acetaminophen (TYLENOL) 500 MG tablet Take 500 mg by mouth every 8 (eight) hours as needed (pain).    Marland Kitchen amoxicillin (AMOXIL) 500 MG capsule Take 2,000 mg by mouth as directed. Before dental work    . famotidine (PEPCID) 10 MG tablet Take 10 mg by mouth every morning.     . metoprolol tartrate (LOPRESSOR) 25 MG tablet Take  1 tablet (25 mg total) by mouth 2 (two) times daily. 180 tablet 3  . Potassium 99 MG TABS Take 99 mg by mouth daily.    . Pseudoeph-Doxylamine-DM-APAP (NYQUIL MULTI-SYMPTOM PO) Take 1 capsule by mouth at bedtime.    . rivaroxaban (XARELTO) 20 MG TABS tablet Take 1 tablet (20 mg total) by mouth daily with supper. 90 tablet 3  . trolamine salicylate (ASPERCREME) 10 % cream Apply 1 application topically as needed for muscle pain.     Marland Kitchen amLODipine (NORVASC) 5 MG tablet Take 1 tablet (5 mg total) by mouth daily. 90 tablet 3   No current facility-administered medications for this visit.    Allergies:   Patient has no known allergies.   Social History:  The patient  reports that he has never smoked. He has never used smokeless tobacco. He reports current alcohol use of about 32.0 standard drinks of alcohol per week. He reports that he does not use drugs.   Family History:  The patient's family history includes Healthy in his brother; Hypertension in his mother; Stroke in his father.    ROS:  Please see the history of present illness.   Otherwise, review of systems is positive for none.   All other systems are reviewed and negative.    PHYSICAL EXAM: VS:  BP 124/74   Pulse (!) 118   Ht 6\' 1"  (1.854 m)   Wt 200 lb 9.6 oz (91 kg)   SpO2 98%   BMI 26.47 kg/m  , BMI Body mass index is 26.47 kg/m. GEN: Well nourished, well developed, in no acute distress  HEENT: normal  Neck: no JVD, carotid bruits, or masses Cardiac: RRR; no murmurs, rubs, or gallops,no edema  Respiratory:  clear to auscultation bilaterally, normal work of breathing GI: soft, nontender, nondistended, + BS MS: no deformity or atrophy  Skin: warm and dry Neuro:  Strength and sensation are intact Psych: euthymic mood, full affect  EKG:  EKG is ordered today. Personal review of the ekg ordered shows atrial fibrillation, rate 118  Recent Labs: 07/14/2018: TSH 2.790 07/23/2018: ALT 51 04/19/2019: Hemoglobin 12.1; Platelets 203 05/13/2019: BUN 7; Creatinine, Ser 0.77; Potassium 4.3; Sodium 137    Lipid Panel     Component Value Date/Time   CHOL 223 (H) 07/14/2018 0849   TRIG 64 07/14/2018 0849   HDL 100 07/14/2018 0849   CHOLHDL 2.2 07/14/2018 0849   CHOLHDL 1 07/22/2012 0759   VLDL 7.2 07/22/2012 0759   LDLCALC 110 (H) 07/14/2018 0849   LDLDIRECT 58.5 07/22/2012 0759     Wt Readings from Last 3 Encounters:  05/31/19 200 lb 9.6 oz (91  kg)  04/01/19 200 lb (90.7 kg)  10/08/18 204 lb (92.5 kg)      Other studies Reviewed: Additional studies/ Adams that were reviewed today include: TTE 07/20/18  Review of the above Adams today demonstrates:  1. The left ventricle has normal systolic function with an ejection  fraction of 60-65%. The cavity size was normal. There is mildly increased  left ventricular wall thickness. Left ventricular diastolic Doppler  parameters are indeterminate secondary to  atrial fibrillation.  2. The right ventricle has normal systolic function. The cavity was  normal. There is no increase in right ventricular wall thickness.  3. Left atrial size was severely dilated.  4. Right atrial size was severely dilated.  5. There is mild mitral annular calcification present.  6. The aortic valve is tricuspid. Aortic valve regurgitation was not  assessed  by color flow Doppler.   Monitor 04/26/19 Atrial fibrillation   92 to 216 bpm  Average HR 130 bpm Occasional PVCs and NSVT  Longest spell 18 beats at 170 bpm   ASSESSMENT AND PLAN:  1.  Persistent atrial fibrillation: Wore a cardiac monitor that shows rapid rates during atrial fibrillation.  Currently on metoprolol, Xarelto.  CHA2DS2-VASc of 2.  He does not have much in the way of symptoms from his atrial fibrillation.  He is also drinking quite a bit.  We talked at length about stopping alcohol.  He is unsure if he wants any further rhythm control or if he just wishes for rate control.  We Cylis Ayars increase his metoprolol to 50 mg twice a day.  He Shelaine Frie check his heart rates over the next few weeks and send [place] a message to see if further medication titration is needed.  I Caydee Talkington see him back in 3 months for further discussion of possible rhythm control.  He Rodrigo Mcgranahan try to cut back on his drinking between now and then.  2.  Hypertension: Currently well controlled  3.  Alcohol abuse: Cessation encouraged.  Case discussed with referring  cardiologist  Current medicines are reviewed at length with the patient today.   The patient does not have concerns regarding his medicines.  The following changes were made today: Increase metoprolol  Labs/ tests ordered today include:  Orders Placed This Encounter  Procedures  . EKG 12-Lead     Disposition:   FU with Otila Starn 3 months associated  Signed, Shanea Karney Meredith Leeds, MD  05/31/2019 11:48 AM     [facility] [address] [place] [address] [phone] (office) [phone] (fax)

## 2019-06-08 ENCOUNTER — Other Ambulatory Visit: Payer: Self-pay | Admitting: Orthopedic Surgery

## 2019-06-08 DIAGNOSIS — M25551 Pain in right hip: Secondary | ICD-10-CM

## 2019-06-13 ENCOUNTER — Ambulatory Visit
Admission: RE | Admit: 2019-06-13 | Discharge: 2019-06-13 | Disposition: A | Discharge status: Home / Self Care | Payer: BC Managed Care – PPO | Source: Ambulatory Visit | Attending: Orthopedic Surgery | Admitting: Orthopedic Surgery

## 2019-06-13 DIAGNOSIS — M25551 Pain in right hip: Secondary | ICD-10-CM

## 2019-06-16 ENCOUNTER — Encounter: Payer: Self-pay | Admitting: *Deleted

## 2019-06-16 ENCOUNTER — Other Ambulatory Visit: Payer: Self-pay | Admitting: Hematology & Oncology

## 2019-06-16 DIAGNOSIS — R2241 Localized swelling, mass and lump, right lower limb: Secondary | ICD-10-CM

## 2019-06-16 DIAGNOSIS — R1907 Generalized intra-abdominal and pelvic swelling, mass and lump: Secondary | ICD-10-CM

## 2019-06-16 NOTE — Progress Notes (Signed)
Reached out to Kerry Adams to introduce myself as the office RN Navigator and explain our new patient process. Reviewed the reason for their referral and scheduled their new patient appointment along with labs. Provided address and directions to the office including call back phone number. Reviewed with patient any concerns they may have or any possible barriers to attending their appointment. Patient also requested that I speak to Kerry Adams, his wife, regarding appointment information.   Informed patient about my role as a navigator and that I will meet with them prior to their New Patient appointment and more fully discuss what services I can provide. At this time patient has no further questions or needs.   Reviewed referral with Dr Marin Olp. He would like the patient to have CT scans (CAP) before his appointment. He also needs a biopsy. All orders were placed and authorization obtained.   Patient is scheduled for Monday, 4/26 for scans and to see Dr Marin Olp. Called the wife and reviewed all appointment info. Also instructed her to come to [facility] and pick up contrast prior to the scan on Monday. She understood and plans on coming tomorrow. The patient will start new insurance on May 1st and she would like his biopsy to take place after that date. I told her that was fine. I did advise her however, that they would likely call and schedule it before then.

## 2019-06-20 ENCOUNTER — Encounter: Payer: Self-pay | Admitting: Hematology & Oncology

## 2019-06-20 ENCOUNTER — Other Ambulatory Visit: Payer: Self-pay

## 2019-06-20 ENCOUNTER — Ambulatory Visit (HOSPITAL_BASED_OUTPATIENT_CLINIC_OR_DEPARTMENT_OTHER)
Admission: RE | Admit: 2019-06-20 | Discharge: 2019-06-20 | Disposition: A | Discharge status: Home / Self Care | Payer: BC Managed Care – PPO | Source: Ambulatory Visit | Attending: Hematology & Oncology | Admitting: Hematology & Oncology

## 2019-06-20 ENCOUNTER — Inpatient Hospital Stay: Payer: BC Managed Care – PPO | Attending: Hematology & Oncology

## 2019-06-20 ENCOUNTER — Inpatient Hospital Stay (HOSPITAL_BASED_OUTPATIENT_CLINIC_OR_DEPARTMENT_OTHER): Payer: BC Managed Care – PPO | Admitting: Hematology & Oncology

## 2019-06-20 ENCOUNTER — Encounter: Payer: Self-pay | Admitting: *Deleted

## 2019-06-20 ENCOUNTER — Encounter (HOSPITAL_BASED_OUTPATIENT_CLINIC_OR_DEPARTMENT_OTHER): Payer: Self-pay

## 2019-06-20 DIAGNOSIS — Z79899 Other long term (current) drug therapy: Secondary | ICD-10-CM | POA: Insufficient documentation

## 2019-06-20 DIAGNOSIS — Z9181 History of falling: Secondary | ICD-10-CM | POA: Diagnosis not present

## 2019-06-20 DIAGNOSIS — M1711 Unilateral primary osteoarthritis, right knee: Secondary | ICD-10-CM | POA: Insufficient documentation

## 2019-06-20 DIAGNOSIS — C4021 Malignant neoplasm of long bones of right lower limb: Secondary | ICD-10-CM | POA: Diagnosis not present

## 2019-06-20 DIAGNOSIS — I4891 Unspecified atrial fibrillation: Secondary | ICD-10-CM | POA: Diagnosis not present

## 2019-06-20 DIAGNOSIS — C7801 Secondary malignant neoplasm of right lung: Secondary | ICD-10-CM

## 2019-06-20 DIAGNOSIS — Z7189 Other specified counseling: Secondary | ICD-10-CM

## 2019-06-20 DIAGNOSIS — C499 Malignant neoplasm of connective and soft tissue, unspecified: Secondary | ICD-10-CM

## 2019-06-20 DIAGNOSIS — R2241 Localized swelling, mass and lump, right lower limb: Secondary | ICD-10-CM | POA: Insufficient documentation

## 2019-06-20 DIAGNOSIS — Z96641 Presence of right artificial hip joint: Secondary | ICD-10-CM | POA: Insufficient documentation

## 2019-06-20 DIAGNOSIS — R1907 Generalized intra-abdominal and pelvic swelling, mass and lump: Secondary | ICD-10-CM

## 2019-06-20 DIAGNOSIS — Z7901 Long term (current) use of anticoagulants: Secondary | ICD-10-CM | POA: Diagnosis not present

## 2019-06-20 DIAGNOSIS — I10 Essential (primary) hypertension: Secondary | ICD-10-CM | POA: Insufficient documentation

## 2019-06-20 DIAGNOSIS — E785 Hyperlipidemia, unspecified: Secondary | ICD-10-CM | POA: Diagnosis not present

## 2019-06-20 DIAGNOSIS — R222 Localized swelling, mass and lump, trunk: Secondary | ICD-10-CM | POA: Insufficient documentation

## 2019-06-20 DIAGNOSIS — C801 Malignant (primary) neoplasm, unspecified: Secondary | ICD-10-CM

## 2019-06-20 DIAGNOSIS — K219 Gastro-esophageal reflux disease without esophagitis: Secondary | ICD-10-CM | POA: Diagnosis not present

## 2019-06-20 DIAGNOSIS — M25551 Pain in right hip: Secondary | ICD-10-CM | POA: Diagnosis not present

## 2019-06-20 DIAGNOSIS — C787 Secondary malignant neoplasm of liver and intrahepatic bile duct: Secondary | ICD-10-CM | POA: Diagnosis not present

## 2019-06-20 DIAGNOSIS — C7971 Secondary malignant neoplasm of right adrenal gland: Secondary | ICD-10-CM

## 2019-06-20 HISTORY — DX: Malignant neoplasm of long bones of right lower limb: C40.21

## 2019-06-20 HISTORY — DX: Secondary malignant neoplasm of right adrenal gland: C80.1

## 2019-06-20 HISTORY — DX: Secondary malignant neoplasm of right lung: C78.01

## 2019-06-20 HISTORY — DX: Malignant (primary) neoplasm, unspecified: C79.71

## 2019-06-20 HISTORY — DX: Other specified counseling: Z71.89

## 2019-06-20 HISTORY — DX: Malignant neoplasm of connective and soft tissue, unspecified: C49.9

## 2019-06-20 LAB — CBC WITH DIFFERENTIAL (CANCER CENTER ONLY)
Abs Immature Granulocytes: 0.01 10*3/uL (ref 0.00–0.07)
Basophils Absolute: 0.1 10*3/uL (ref 0.0–0.1)
Basophils Relative: 2 %
Eosinophils Absolute: 0 10*3/uL (ref 0.0–0.5)
Eosinophils Relative: 1 %
HCT: 37.9 % — ABNORMAL LOW (ref 39.0–52.0)
Hemoglobin: 12 g/dL — ABNORMAL LOW (ref 13.0–17.0)
Immature Granulocytes: 0 %
Lymphocytes Relative: 22 %
Lymphs Abs: 1.1 10*3/uL (ref 0.7–4.0)
MCH: 26.9 pg (ref 26.0–34.0)
MCHC: 31.7 g/dL (ref 30.0–36.0)
MCV: 85 fL (ref 80.0–100.0)
Monocytes Absolute: 1.1 10*3/uL — ABNORMAL HIGH (ref 0.1–1.0)
Monocytes Relative: 21 %
Neutro Abs: 2.7 10*3/uL (ref 1.7–7.7)
Neutrophils Relative %: 54 %
Platelet Count: 169 10*3/uL (ref 150–400)
RBC: 4.46 MIL/uL (ref 4.22–5.81)
RDW: 18.2 % — ABNORMAL HIGH (ref 11.5–15.5)
WBC Count: 5 10*3/uL (ref 4.0–10.5)
nRBC: 0 % (ref 0.0–0.2)

## 2019-06-20 LAB — CMP (CANCER CENTER ONLY)
ALT: 23 U/L (ref 0–44)
AST: 61 U/L — ABNORMAL HIGH (ref 15–41)
Albumin: 3.6 g/dL (ref 3.5–5.0)
Alkaline Phosphatase: 127 U/L — ABNORMAL HIGH (ref 38–126)
Anion gap: 10 (ref 5–15)
BUN: 8 mg/dL (ref 8–23)
CO2: 27 mmol/L (ref 22–32)
Calcium: 9.2 mg/dL (ref 8.9–10.3)
Chloride: 99 mmol/L (ref 98–111)
Creatinine: 0.67 mg/dL (ref 0.61–1.24)
GFR, Est AFR Am: >60 mL/min (ref >60)
GFR, Estimated: >60 mL/min (ref >60)
Glucose, Bld: 107 mg/dL — ABNORMAL HIGH (ref 70–99)
Potassium: 4.1 mmol/L (ref 3.5–5.1)
Sodium: 136 mmol/L (ref 135–145)
Total Bilirubin: 1.9 mg/dL — ABNORMAL HIGH (ref 0.3–1.2)
Total Protein: 6.6 g/dL (ref 6.5–8.1)

## 2019-06-20 LAB — LACTATE DEHYDROGENASE: LDH: 237 U/L — ABNORMAL HIGH (ref 98–192)

## 2019-06-20 LAB — CEA (IN HOUSE-CHCC): CEA (CHCC-In House): 8.33 ng/mL — ABNORMAL HIGH (ref 0.00–5.00)

## 2019-06-20 LAB — SAVE SMEAR(SSMR), FOR PROVIDER SLIDE REVIEW

## 2019-06-20 MED ORDER — IOHEXOL 300 MG/ML  SOLN
100.0000 mL | Freq: Once | INTRAMUSCULAR | Status: AC | PRN
  Administered 2019-06-20: 100 mL via INTRAVENOUS

## 2019-06-20 MED ORDER — TRAMADOL HCL 50 MG PO TABS: 50.0000 mg | ORAL_TABLET | Freq: Four times a day (QID) | ORAL | 0 refills | Status: DC | PRN

## 2019-06-20 NOTE — Progress Notes (Signed)
Referral MD  Reason for Referral: Mass in the right hip-likely sarcoma  Chief Complaint  Patient presents with  . New Patient (Initial Visit)  : I have pain in my right hip.  HPI: Kerry Adams is a very nice 64 year old white male.  He lives up in [place].  He comes in with his wife.  I actually took care I was wife's first husband about 20 years ago.  He had CML.  Kerry Adams said about a year ago, he began to have some discomfort with his right hip.  He had previously had surgery on the right hip and had a hip replacement after a fall.  This was several years ago.  His wife said that he and she used to walk quite a bit.  He then stopped walking a few months ago because of pain.  The pain seemed to get worse.  At first, it was thought to be bursitis.  He had some injections which did not help.  The following saw Dr. Redmond Baseman of [facility].  Not surprisingly, Dr. Elesa Massed, being incredibly thorough, ordered a CT scan.  This, fourth that showed a large mass in the right hip area.  This mass measured 14 x 10.2 x 7 point centimeters.  It involve the right greater trochanter with intertrochanteric extension into the inferior aspect of the right femoral neck.  No fracture was noted at the time.  At that point, Dr. Norton Pastel kindly referred Kerry Adams to the [facility] for an evaluation.  Kerry Adams had a CT scan done the morning of his visit.  This was quite remarkable.  This actually showed wide spread disease.  He had multiple hepatic metastasis.  He had a right adrenal metastasis.  Surprisingly, he was noted to have a large mass at the first costochondral junction measuring 6.1 x 7.7 cm.  He did have a nodule in the lower right lobe measuring 1.1 x 1.6 cm.  Kerry Adams has never smoked.  He has not been in the [employer].  As such he was not in [place] with any exposure to [employer].  He does drink quite a bit.  He is cutting back on his alcohol use.  He does  have atrial fibrillation.  He is on anticoagulation for this.  He has had atrial fibrillation for a couple years.  He has not lost weight.  He actually looks quite good.  There is no chest wall pain.  Has had no cough.  There is no hemoptysis.  He has had no problems with bowels or bladder.  He has had no bleeding.  He has had no fever.  He has had no headache.  Overall, I would say his performance status is ECOG 1.      Past Medical History:  Diagnosis Date  . Abnormal EKG   . Alcohol use 02/18/2014  . Arthritis    osteo knees and hips   . CAD (coronary artery disease)    WITHOUT ANGINA PECTORIS, UNSPECIFIED VESSEL OR LESION TYPE, UNSPECIFIED WHETHER NATIVE OR TRANSPLANTED HEART  . Chronic liver disease   . Cirrhosis ([place])   . Closed right hip fracture ([place]) 02/18/2014  . Dizziness   . Elevated liver function tests   . GERD (gastroesophageal reflux disease)    treats with OTC Ranitidine  . Hip fracture requiring operative repair ([place]) 02/19/2014  . Hyperlipidemia   . Hypertension   . Osteoarthritis of knee, unilateral 03/01/2015  . Primary osteoarthritis of right knee  05/23/2015  . Thrombocytopenia ([place])   :  Past Surgical History:  Procedure Laterality Date  . CARDIOVERSION N/A 09/13/2018   Procedure: CARDIOVERSION;  Surgeon: Acie Fredrickson Wonda Cheng, MD;  Location: [facility] ENDOSCOPY;  Service: Cardiovascular;  Laterality: N/A;  . FRACTURE SURGERY     right femur  . HERNIA REPAIR     umbilical  . INTERTROCHANTERIC HIP FRACTURE SURGERY Right 02/19/2014  . INTRAMEDULLARY (IM) NAIL INTERTROCHANTERIC Right 02/19/2014   Procedure: INTRAMEDULLARY (IM) NAIL INTERTROCHANTRIC;  Surgeon: [name], MD;  Location: [facility];  Service: Orthopedics;  Laterality: Right;  . SHOULDER SURGERY Right   . TOTAL KNEE ARTHROPLASTY Left 03/01/2015   Procedure: TOTAL KNEE ARTHROPLASTY;  Surgeon: [name], MD;  Location: [facility];  Service: Orthopedics;  Laterality: Left;  Left knee arthroplasty   . TOTAL KNEE ARTHROPLASTY Right 05/23/2015   Procedure: TOTAL KNEE ARTHROPLASTY W/HARDWARE REMOVAL FEMUR;  Surgeon: Frederik Pear, MD;  Location: [facility];  Service: Orthopedics;  Laterality: Right;  :   Current Outpatient Medications:  .  loperamide (IMODIUM A-D) 2 MG tablet, Take 1 mg by mouth at bedtime as needed for diarrhea or loose stools. Takes 1/2 tablet total of 1 mg at bedtime., Disp: , Rfl:  .  acetaminophen (TYLENOL) 500 MG tablet, Take 500 mg by mouth every 8 (eight) hours as needed (pain)., Disp: , Rfl:  .  amLODipine (NORVASC) 5 MG tablet, Take 1 tablet (5 mg total) by mouth daily., Disp: 90 tablet, Rfl: 3 .  amoxicillin (AMOXIL) 500 MG capsule, Take 2,000 mg by mouth as directed. Before dental work, Disp: , Rfl:  .  famotidine (PEPCID) 10 MG tablet, Take 10 mg by mouth every morning. , Disp: , Rfl:  .  metoprolol tartrate (LOPRESSOR) 50 MG tablet, Take 1 tablet (50 mg total) by mouth 2 (two) times daily., Disp: 180 tablet, Rfl: 1 .  Potassium 99 MG TABS, Take 99 mg by mouth daily., Disp: , Rfl:  .  Pseudoeph-Doxylamine-DM-APAP (NYQUIL MULTI-SYMPTOM PO), Take 1 capsule by mouth at bedtime., Disp: , Rfl:  .  rivaroxaban (XARELTO) 20 MG TABS tablet, Take 1 tablet (20 mg total) by mouth daily with supper., Disp: 90 tablet, Rfl: 3 .  traMADol (ULTRAM) 50 MG tablet, Take 1 tablet (50 mg total) by mouth every 6 (six) hours as needed., Disp: 90 tablet, Rfl: 0 .  trolamine salicylate (ASPERCREME) 10 % cream, Apply 1 application topically as needed for muscle pain., Disp: , Rfl: :  :  No Known Allergies:  Family History  Problem Relation Age of Onset  . Hypertension Mother   . Stroke Father   . Healthy Brother   :  Social History   Socioeconomic History  . Marital status: Married    Spouse name: Not on file  . Number of children: Not on file  . Years of education: Not on file  . Highest education level: Not on file  Occupational History  . Not on file  Tobacco Use  . Smoking  status: Never Smoker  . Smokeless tobacco: Never Used  Substance and Sexual Activity  . Alcohol use: Yes    Alcohol/week: 32.0 standard drinks    Types: 32 Cans of beer per week    Comment: 6 beers /day  . Drug use: No  . Sexual activity: Not on file  Other Topics Concern  . Not on file  Social History Narrative  . Not on file   Social Determinants of Health   Financial Resource Strain:   .  Difficulty of Paying Living Expenses:   Food Insecurity:   . Worried About Charity fundraiser in the Last Year:   . Arboriculturist in the Last Year:   Transportation Needs:   . Film/video editor (Medical):   Marland Kitchen Lack of Transportation (Non-Medical):   Physical Activity:   . Days of Exercise per Week:   . Minutes of Exercise per Session:   Stress:   . Feeling of Stress :   Social Connections:   . Frequency of Communication with Friends and Family:   . Frequency of Social Gatherings with Friends and Family:   . Attends Religious Services:   . Active Member of Clubs or Organizations:   . Attends Archivist Meetings:   Marland Kitchen Marital Status:   Intimate Partner Violence:   . Fear of Current or Ex-Partner:   . Emotionally Abused:   Marland Kitchen Physically Abused:   . Sexually Abused:   :  Review of Systems  Constitutional: Negative.   HENT: Negative.   Eyes: Negative.   Respiratory: Negative.   Cardiovascular: Negative.   Gastrointestinal: Negative.   Genitourinary: Negative.   Musculoskeletal: Positive for joint pain.  Skin: Negative.   Neurological: Negative.   Endo/Heme/Allergies: Negative.   Psychiatric/Behavioral: Negative.      Exam:  This is a well-developed well-nourished white male in no obvious distress.  Vital signs show temperature of 97.1.  Pulse 51.  Blood pressure 102/62.  Weight is 203 pounds.  Head and neck exam shows no ocular or oral lesions.  There are no palpable cervical or supraclavicular lymph nodes.  Lungs are clear bilaterally.  Cardiac exam irregular  rate and irregular rhythm consistent with atrial fibrillation.  There are no murmurs, rubs or bruits.  Chest wall exam shows no obvious fullness or tenderness along the chest wall.  There is no tenderness over the sternum.  Abdominal exam shows a soft abdomen with good bowel sounds.  There is no fluid wave.  There is no palpable liver or spleen tip.  Back exam shows some tenderness over the right hip.  There is some fullness over the right hip.  No tenderness over the spine, or ribs.   Extremities shows no clubbing, cyanosis or edema.  Again there might be decreased range of motion of the right hip.  He has decent pulses in his distal extremities.  Neurological exam shows no focal neurological deficits.  Skin exam shows no rashes, ecchymoses or petechia.   @IPVITALS @   Recent Labs    06/20/19 0945  WBC 5.0  HGB 12.0*  HCT 37.9*  PLT 169   Recent Labs    06/20/19 0945  NA 136  K 4.1  CL 99  CO2 27  GLUCOSE 107*  BUN 8  CREATININE 0.67  CALCIUM 9.2    Blood smear review: None  Pathology: Pending    Assessment and Plan: Kerry Adams is a very nice 64 year old white male.  I suspect he has a tough problem.  I have to believe this is going to be a sarcoma.  He does not smoke.  I cannot imagine this being any other kind of cancer outside of possibly melanoma.  I forgot to mention that he has had some skin lesions removed.  I do not think there were melanoma lesions however.  Obviously, our goal is to get a biopsy.  I would think the easiest to biopsy is going to be the right hip.  He has this mass in  the chest wall.  However, this looks like it might be intrathoracic and this might be a little bit tricky to biopsy.  I cannot imagine that he has 2 separate malignancies.  This is sarcoma, we will have to await genetic markers from our next generation sequencing to see if there might be any targeted therapy or possibly immunotherapy that we could use.  I had a long talk with he and  his wife about the possible diagnosis.  They asked me how long I thought he had to live.  I told them that if this is a sarcoma, I think even with treatment, were probably looking at less then a year.  Kerry Adams will decide whether or not he is going to have systemic therapy.  I would certainly respect his decision not to be treated with systemic therapy if he decides.  He will definitely need radiation therapy to the right hip.  I have already spoken to Dr. Teryl Lucy of Radiation Oncology and he will get Kerry Adams in for an evaluation.  I cannot imagine that surgery would be an option for his right hip.  He is already had 1 surgery for the right hip in the past.  Clearly our goal here is palliation.  He will also need Xgeva.  I think this might help.  When we see him back after the biopsy and pathology results are back, we will give him Xgeva.  I will also then talk to him about what our options are for therapy.  I would imagine that we probably would consider systemic chemotherapy.  Again he may not want to take this and just go with comfort care.  If so, I really cannot argue with that.  I spent over an hour with he and his wife.  He is very nice.  I had a long talk with him.  I reviewed the CT scan with them.  He clearly sees the problem that he is up against.

## 2019-06-20 NOTE — Progress Notes (Signed)
Initial RN Navigator Patient Visit  Name: Kerry Adams Date of Referral : 06/16/2019 Diagnosis: Mass in the right hip-likely sarcoma  Met with patient prior to their visit with MD. [name] patient "Your Patient Navigator" handout which explains my role, areas in which I am able to help, and all the contact information for myself and the office. Also gave patient MD and Navigator business card. Reviewed with patient the general overview of expected course after initial diagnosis and time frame for all steps to be completed.  New patient packet given to patient which includes: orientation to office and staff; campus directory; education on My Chart and Advance Directives; and patient centered education on cancer.   Referral to radiation oncology entered. Dr Marin Olp spoke directly to Dr Sondra Come. Awaiting scheduling of biopsy. Orders have been placed. Instructed the patient and wife to call if they haven't been scheduled by Wednesday. Will also follow along in chart.   Patient understands all follow up procedures and expectations. They have my number to reach out for any further clarification or additional needs.

## 2019-06-21 ENCOUNTER — Encounter (HOSPITAL_COMMUNITY): Payer: Self-pay

## 2019-06-21 ENCOUNTER — Telehealth: Payer: Self-pay | Admitting: Hematology & Oncology

## 2019-06-21 LAB — IGG, IGA, IGM
IgA: 457 mg/dL — ABNORMAL HIGH (ref 61–437)
IgG (Immunoglobin G), Serum: 1078 mg/dL (ref 603–1613)
IgM (Immunoglobulin M), Srm: 80 mg/dL (ref 20–172)

## 2019-06-21 LAB — PROTEIN ELECTROPHORESIS, SERUM, WITH REFLEX
A/G Ratio: 1.1 (ref 0.7–1.7)
Albumin ELP: 3.2 g/dL (ref 2.9–4.4)
Alpha-1-Globulin: 0.3 g/dL (ref 0.0–0.4)
Alpha-2-Globulin: 0.6 g/dL (ref 0.4–1.0)
Beta Globulin: 1.2 g/dL (ref 0.7–1.3)
Gamma Globulin: 0.9 g/dL (ref 0.4–1.8)
Globulin, Total: 3 g/dL (ref 2.2–3.9)
Total Protein ELP: 6.2 g/dL (ref 6.0–8.5)

## 2019-06-21 LAB — KAPPA/LAMBDA LIGHT CHAINS
Kappa free light chain: 23.3 mg/L — ABNORMAL HIGH (ref 3.3–19.4)
Kappa, lambda light chain ratio: 1.17 (ref 0.26–1.65)
Lambda free light chains: 19.9 mg/L (ref 5.7–26.3)

## 2019-06-21 NOTE — Progress Notes (Signed)
[name] Male, 64 y.o., 30-May-1955 MRN:  [id number] Phone:  [phone] Jerilynn Mages) PCP:  Vicenta Aly, FNP Coverage:  Sherre Poot [employer] Ppo Next Appt With Radiation Oncology 06/30/2019 at 1:30 PM  RE: Biopsy Received: Yesterday Message Contents  Markus Daft, MD  Ernestene Mention for CT guided biopsy of right pelvic/hip mass    [place]   Previous Messages  ----- Message -----  From: Lenore Cordia  Sent: 06/17/2019  4:31 PM EDT  To: Ir Procedure Requests  Subject: Biopsy                      Procedure Requested: CT Biopsy    Reason for Procedure: large mass involving the RIGHT hip.    Provider Requesting: Dr. Burney Gauze   Provider Telephone: [phone]    Other Info: core biopsies needed for molecular markers

## 2019-06-21 NOTE — Telephone Encounter (Signed)
No los 4/26

## 2019-06-21 NOTE — Progress Notes (Unsigned)
[name] Male, 64 y.o., [birth year] MRN:  [id number] Phone:  [phone] Jerilynn Mages) PCP:  Vicenta Aly, FNP Coverage:  Sherre Poot Blue [employer] Comm Ppo Next Appt With Radiology ([facility] 1) 07/04/2019 at 11:00 AM  RE: Biopsy Received: Yesterday Message Contents  Markus Daft, MD  Ernestene Mention for CT guided biopsy of right pelvic/hip mass    [place]   Previous Messages  ----- Message -----  From: Lenore Cordia  Sent: 06/17/2019  4:31 PM EDT  To: Ir Procedure Requests  Subject: Biopsy                      Procedure Requested: CT Biopsy    Reason for Procedure: large mass involving the RIGHT hip.    Provider Requesting: Dr. Burney Gauze   Provider Telephone: [phone]    Other Info: core biopsies needed for molecular markers

## 2019-06-22 ENCOUNTER — Other Ambulatory Visit: Payer: BC Managed Care – PPO

## 2019-06-22 ENCOUNTER — Encounter: Payer: Self-pay | Admitting: *Deleted

## 2019-06-22 ENCOUNTER — Encounter (HOSPITAL_COMMUNITY): Payer: Self-pay

## 2019-06-22 ENCOUNTER — Ambulatory Visit: Payer: BC Managed Care – PPO | Admitting: Hematology & Oncology

## 2019-06-22 NOTE — Progress Notes (Signed)
[name] Male, 64 y.o., 1955-06-17 MRN:  [id number] Phone:  [phone] Jerilynn Mages) PCP:  Vicenta Aly, FNP Coverage:  Sherre Poot [employer] Comm Ppo Next Appt With Radiology ([facility] 1) 07/04/2019 at 11:00 AM  RE: Biopsy Received: Yesterday Message Contents  Volanda Napoleon, MD  Lennox Solders E      I see no problems with holding the Xarelto for 1 day prior to bx. Thanks!!! I will pray for you!!! Laurey Arrow   Previous Messages  ----- Message -----  From: Lenore Cordia  Sent: 06/21/2019  2:19 PM EDT  To: Cordelia Poche, RN, Volanda Napoleon, MD  Subject: RE: Biopsy                    Hello   Mr. Suguitan is scheduled for May 10th.  I did tell Mrs. Weigold that you wanted this procedure done next week.  She said they are aware, but would like it on May 10th.   Also, Mr. Filar is on Xarelto and will need to hold it 1 day.  You permission is needed.   Thank you  Zeki Bedrosian  ----- Message -----  From: Cordelia Poche, RN  Sent: 06/20/2019  2:41 PM EDT

## 2019-06-22 NOTE — Progress Notes (Signed)
Patient is scheduled for Rad Onc consultation on 06/30/19 Patient is scheduled for core biopsy on 07/04/19 (later due to patient preference)  Scheduled patient's follow up appointment with Dr Marin Olp on 5/14 to discuss biopsy results and to review treatment recommendation. Called patient's wife, and she is aware of new appointment.  Kerry Adams also asks about patient getting a pneumonia vaccine when in the office. She has had to cancel patient's appointment with PCP due to all the other appointments and she'd like for him to get this here at his next appointment. Note added to chart and message sent to pharmacy and MD for confirmation. Informed her I would call her once I knew if that was possible.

## 2019-06-29 MED ORDER — METOPROLOL TARTRATE 50 MG PO TABS: 50.0000 mg | ORAL_TABLET | Freq: Two times a day (BID) | ORAL | 1 refills | Status: DC

## 2019-06-30 ENCOUNTER — Other Ambulatory Visit: Payer: Self-pay

## 2019-06-30 ENCOUNTER — Ambulatory Visit
Admission: RE | Admit: 2019-06-30 | Discharge: 2019-06-30 | Disposition: A | Discharge status: Home / Self Care | Payer: Medicare Other | Source: Ambulatory Visit | Attending: Radiation Oncology | Admitting: Radiation Oncology

## 2019-06-30 ENCOUNTER — Encounter: Payer: Self-pay | Admitting: Radiation Oncology

## 2019-06-30 VITALS — BP 108/81 | HR 82 | Temp 98.7°F | Resp 20 | Ht 73.0 in | Wt 206.8 lb

## 2019-06-30 DIAGNOSIS — Z7901 Long term (current) use of anticoagulants: Secondary | ICD-10-CM | POA: Diagnosis not present

## 2019-06-30 DIAGNOSIS — Z85820 Personal history of malignant melanoma of skin: Secondary | ICD-10-CM | POA: Diagnosis not present

## 2019-06-30 DIAGNOSIS — C7951 Secondary malignant neoplasm of bone: Secondary | ICD-10-CM | POA: Diagnosis not present

## 2019-06-30 DIAGNOSIS — I4891 Unspecified atrial fibrillation: Secondary | ICD-10-CM | POA: Insufficient documentation

## 2019-06-30 DIAGNOSIS — R222 Localized swelling, mass and lump, trunk: Secondary | ICD-10-CM | POA: Insufficient documentation

## 2019-06-30 DIAGNOSIS — Z510 Encounter for antineoplastic radiation therapy: Secondary | ICD-10-CM | POA: Diagnosis not present

## 2019-06-30 DIAGNOSIS — K7460 Unspecified cirrhosis of liver: Secondary | ICD-10-CM | POA: Diagnosis not present

## 2019-06-30 DIAGNOSIS — M1711 Unilateral primary osteoarthritis, right knee: Secondary | ICD-10-CM | POA: Insufficient documentation

## 2019-06-30 DIAGNOSIS — R1909 Other intra-abdominal and pelvic swelling, mass and lump: Secondary | ICD-10-CM | POA: Diagnosis present

## 2019-06-30 DIAGNOSIS — K219 Gastro-esophageal reflux disease without esophagitis: Secondary | ICD-10-CM | POA: Diagnosis not present

## 2019-06-30 DIAGNOSIS — C7800 Secondary malignant neoplasm of unspecified lung: Secondary | ICD-10-CM | POA: Diagnosis not present

## 2019-06-30 DIAGNOSIS — I700 Atherosclerosis of aorta: Secondary | ICD-10-CM | POA: Insufficient documentation

## 2019-06-30 DIAGNOSIS — E785 Hyperlipidemia, unspecified: Secondary | ICD-10-CM | POA: Insufficient documentation

## 2019-06-30 DIAGNOSIS — K769 Liver disease, unspecified: Secondary | ICD-10-CM | POA: Diagnosis not present

## 2019-06-30 DIAGNOSIS — I2721 Secondary pulmonary arterial hypertension: Secondary | ICD-10-CM | POA: Diagnosis not present

## 2019-06-30 DIAGNOSIS — I2510 Atherosclerotic heart disease of native coronary artery without angina pectoris: Secondary | ICD-10-CM | POA: Diagnosis not present

## 2019-06-30 DIAGNOSIS — C220 Liver cell carcinoma: Secondary | ICD-10-CM | POA: Insufficient documentation

## 2019-06-30 DIAGNOSIS — Z79899 Other long term (current) drug therapy: Secondary | ICD-10-CM | POA: Insufficient documentation

## 2019-06-30 DIAGNOSIS — C4021 Malignant neoplasm of long bones of right lower limb: Secondary | ICD-10-CM

## 2019-06-30 DIAGNOSIS — R188 Other ascites: Secondary | ICD-10-CM | POA: Insufficient documentation

## 2019-06-30 DIAGNOSIS — I10 Essential (primary) hypertension: Secondary | ICD-10-CM | POA: Diagnosis not present

## 2019-06-30 DIAGNOSIS — E279 Disorder of adrenal gland, unspecified: Secondary | ICD-10-CM | POA: Insufficient documentation

## 2019-06-30 HISTORY — DX: Unspecified atrial fibrillation: I48.91

## 2019-06-30 NOTE — Progress Notes (Signed)
Histology and Location of Primary Cancer: Unknown primary (Chondrosarcoma of RIGHT femur?)  Sites of Visceral and Bony Metastatic Disease: right hip  Location(s) of Symptomatic Metastases: RIGHT hip  Past/Anticipated chemotherapy by medical oncology, if any:  Under care of Dr. Burney Gauze 06/20/2019 I have to believe this is going to be a sarcoma.  He does not smoke.  I cannot imagine this being any other kind of cancer outside of possibly melanoma.  I forgot to mention that he has had some skin lesions removed.  I do not think there were melanoma lesions however. He has this mass in the chest wall.  However, this looks like it might be intrathoracic and this might be a little bit tricky to biopsy. This is sarcoma, we will have to await genetic markers from our next generation sequencing to see if there might be any targeted therapy or possibly immunotherapy that we could use. Kerry Adams will decide whether or not he is going to have systemic therapy.  I would certainly respect his decision not to be treated with systemic therapy if he decides. I cannot imagine that surgery would be an option for his right hip.  He is already had 1 surgery for the right hip in the past. Clearly our goal here is palliation. He will also need Xgeva.  I think this might help. When we see him back after the biopsy and pathology results are back, we will give him Xgeva.  I will also then talk to him about what our options are for therapy."  Pain on a scale of 0-10 is: 2   Ambulatory status? Walker? Wheelchair?: yes both  SAFETY ISSUES:  Prior radiation? no  Pacemaker/ICD? no  Possible current pregnancy? male  Is the patient on methotrexate? no  Current Complaints / other details:   CT guided biopsy of right hip scheduled for 07/04/2019, with F/U with Dr. Marin Olp on 07/08/2019 to go over results and discuss patient's options.

## 2019-06-30 NOTE — Progress Notes (Signed)
  Radiation Oncology         [phone]) [phone] ________________________________  Name: Kerry Adams MRN: [id number]  Date: 06/30/2019  DOB: [birth year]  SIMULATION AND TREATMENT PLANNING NOTE    ICD-10-CM   1. 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  Identity was confirmed.  All relevant records and images related to the planned course of therapy were reviewed.  The patient freely provided informed written consent to proceed with treatment after reviewing the details related to the planned course of therapy. The consent form was witnessed and verified by the simulation staff.  Then, the patient was set-up in a stable reproducible supine position for radiation therapy.  CT images were obtained.  Surface markings were placed.  The CT images were loaded into the planning software.  Then the target and avoidance structures were contoured.  Treatment planning then occurred.  The radiation prescription was entered and confirmed.  Then, I designed and supervised the construction of a total of 5 medically necessary complex treatment devices.  I have requested : Isodose Plan.  I have ordered:dose calc.  PLAN:  The patient will receive 30 Gy in 10 fractions.  -----------------------------------  Blair Promise, PhD, MD  This document serves as a record of services personally performed by Gery Pray, MD. It was created on his behalf by Clerance Lav, a trained medical scribe. The creation of this record is based on the scribe's personal observations and the provider's statements to them. This document has been checked and approved by the attending provider.

## 2019-06-30 NOTE — Progress Notes (Signed)
Radiation Oncology         [phone] ________________________________  Initial Outpatient Consultation  Name: Kerry Adams MRN: [id number]  Date: 06/30/2019  DOB: 28-Aug-1955  CC:Vicenta Aly, FNP  Gery Pray, MD   REFERRING PHYSICIAN: Gery Pray, MD  DIAGNOSIS: The encounter diagnosis was Chondrosarcoma of femur, right ([place]).  Mass in the right hip - likely sarcoma with metastatic disease to the liver adrenal gland and chest  HISTORY OF PRESENT ILLNESS::Kerry Adams is a 64 y.o. male who is accompanied by wife. The patient presented to Dr. Redmond Baseman of [place] last month for six-month history of right hip pain without recent injury. CT of right hip on 06/13/2019 showed a large destructive soft tissue mass involving the right greater trochanter with intertrochanteric extension into the inferior aspect of the right femoral neck, worrisome for malignancy. There was no evidence of a pathologic fracture or hardware loosening from right hip fracture with fixation in December of 2015.  CT of chest/abdomen/pelvis on 06/20/2019 showed cirrhosis with multiple hyper-attenuating lesions in the liver that may be due to multifocal hepatocellular carcinoma or metastatic disease. There was also right adrenal metastasis, large expansile lytic lesions of the right first costochondral junction and proximal right femur (metstatic disease vs chondrosarcoma), bilateral lower lobe ill-defined peribronchovascular nodular consolidation (metastatic disease vs infectious/inflammatory etiology), mild ascites, and a possible punctate left renal stone.  The patient was referred to Dr. Marin Olp and was seen in consultation on 06/20/2019. At that time, he recommended biopsy of right hip. If this is sarcoma, Dr. Marin Olp recommends awaiting genetic markers from next generation sequencing to see if there may be any targeted therapy or possibly immunotherapy that could be beneficial. The patient was  unsure whether or not he wanted to go through with systemic therapy at that time.  PREVIOUS RADIATION THERAPY: No  PAST MEDICAL HISTORY:  Past Medical History:  Diagnosis Date  . Abnormal EKG   . Alcohol use 02/18/2014  . Arthritis    osteo knees and hips   . Atrial fibrillation ([place])   . CAD (coronary artery disease)    WITHOUT ANGINA PECTORIS, UNSPECIFIED VESSEL OR LESION TYPE, UNSPECIFIED WHETHER NATIVE OR TRANSPLANTED HEART  . Chondrosarcoma of femur, right ([place]) 06/20/2019  . Chronic liver disease   . Cirrhosis ([place])   . Closed right hip fracture ([place]) 02/18/2014  . Dizziness   . Elevated liver function tests   . GERD (gastroesophageal reflux disease)    treats with OTC Ranitidine  . Goals of care, counseling/discussion 06/20/2019  . Hip fracture requiring operative repair ([place]) 02/19/2014  . Hyperlipidemia   . Hypertension   . Metastasis to right adrenal gland of unknown origin ([place]) 06/20/2019  . 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 Osteoarthritis of knee, unilateral 03/01/2015  . Primary osteoarthritis of right knee 05/23/2015  . Thrombocytopenia ([place])     PAST SURGICAL HISTORY: Past Surgical History:  Procedure Laterality Date  . CARDIOVERSION N/A 09/13/2018   Procedure: CARDIOVERSION;  Surgeon: Acie Fredrickson Wonda Cheng, MD;  Location: [facility] ENDOSCOPY;  Service: Cardiovascular;  Laterality: N/A;  . FRACTURE SURGERY     right femur  . HERNIA REPAIR     umbilical  . INTERTROCHANTERIC HIP FRACTURE SURGERY Right 02/19/2014  . INTRAMEDULLARY (IM) NAIL INTERTROCHANTERIC Right 02/19/2014   Procedure: INTRAMEDULLARY (IM) NAIL INTERTROCHANTRIC;  Surgeon: [name], MD;  Location: [place];  Service: Orthopedics;  Laterality: Right;  .  SHOULDER SURGERY Right   . TOTAL KNEE ARTHROPLASTY Left 03/01/2015   Procedure: TOTAL KNEE ARTHROPLASTY;  Surgeon: [name], MD;  Location: [facility];  Service: Orthopedics;   Laterality: Left;  Left knee arthroplasty  . TOTAL KNEE ARTHROPLASTY Right 05/23/2015   Procedure: TOTAL KNEE ARTHROPLASTY W/HARDWARE REMOVAL FEMUR;  Surgeon: Frederik Pear, MD;  Location: [facility];  Service: Orthopedics;  Laterality: Right;    FAMILY HISTORY:  Family History  Problem Relation Age of Onset  . Hypertension Mother   . Stroke Father   . Healthy Brother     SOCIAL HISTORY:  Social History   Tobacco Use  . Smoking status: Never Smoker  . Smokeless tobacco: Never Used  Substance Use Topics  . Alcohol use: Yes    Alcohol/week: 32.0 standard drinks    Types: 32 Cans of beer per week    Comment: 6 beers /day  . Drug use: No    ALLERGIES: No Known Allergies  MEDICATIONS:  Current Outpatient Medications  Medication Sig Dispense Refill  . acetaminophen (TYLENOL) 500 MG tablet Take 500 mg by mouth every 8 (eight) hours as needed (pain).    Marland Kitchen amoxicillin (AMOXIL) 500 MG capsule Take 2,000 mg by mouth as directed. Before dental work    . famotidine (PEPCID) 10 MG tablet Take 10 mg by mouth every morning.     . loperamide (IMODIUM A-D) 2 MG tablet Take 1 mg by mouth at bedtime as needed for diarrhea or loose stools. Takes 1/2 tablet total of 1 mg at bedtime.    . metoprolol tartrate (LOPRESSOR) 50 MG tablet Take 1 tablet (50 mg total) by mouth 2 (two) times daily. 180 tablet 1  . Potassium 99 MG TABS Take 99 mg by mouth daily.    . Pseudoeph-Doxylamine-DM-APAP (NYQUIL MULTI-SYMPTOM PO) Take 1 capsule by mouth at bedtime.    . rivaroxaban (XARELTO) 20 MG TABS tablet Take 1 tablet (20 mg total) by mouth daily with supper. 90 tablet 3  . amLODipine (NORVASC) 5 MG tablet Take 1 tablet (5 mg total) by mouth daily. 90 tablet 3  . traMADol (ULTRAM) 50 MG tablet Take 1 tablet (50 mg total) by mouth every 6 (six) hours as needed. (Patient not taking: Reported on 06/30/2019) 90 tablet 0  . trolamine salicylate (ASPERCREME) 10 % cream Apply 1 application topically as needed for muscle pain.      No current facility-administered medications for this encounter.    REVIEW OF SYSTEMS:  A 10+ POINT REVIEW OF SYSTEMS WAS OBTAINED including neurology, dermatology, psychiatry, cardiac, respiratory, lymph, extremities, GI, GU, musculoskeletal, constitutional, reproductive, HEENT.  Patient reports pain in the right hip worsened by standing.  He has been instructed to use a walker and to limit weightbearing on his right side   PHYSICAL EXAM:  height is 6\' 1"  (1.854 m) and weight is 206 lb 12.8 oz (93.8 kg). His temperature is 98.7 F (37.1 C). His blood pressure is 108/81 and his pulse is 82. His respiration is 20 and oxygen saturation is 100%.   General: Alert and oriented, in no acute distress HEENT: Head is normocephalic. Extraocular movements are intact.  Neck: Neck is supple, no palpable cervical or supraclavicular lymphadenopathy. Heart: Regular in rate and rhythm with no murmurs, rubs, or gallops. Chest: Clear to auscultation bilaterally, with no rhonchi, wheezes, or rales. Abdomen: Soft, nontender, nondistended, with no rigidity or guarding. Extremities: No cyanosis or edema.  Scars noted along both knees from prior knee replacements Lymphatics: see  Neck Exam Skin: No concerning lesions. Musculoskeletal: symmetric strength and muscle tone throughout. Neurologic: Cranial nerves II through XII are grossly intact. No obvious focalities. Speech is fluent. Coordination is intact. Psychiatric: Judgment and insight are intact. Affect is appropriate.   ECOG = 1  0 - Asymptomatic (Fully active, able to carry on all predisease activities without restriction)  1 - Symptomatic but completely ambulatory (Restricted in physically strenuous activity but ambulatory and able to carry out work of a light or sedentary nature. For example, light housework, office work)  2 - Symptomatic, <50% in bed during the day (Ambulatory and capable of all self care but unable to carry out any work activities.  Up and about more than 50% of waking hours)  3 - Symptomatic, >50% in bed, but not bedbound (Capable of only limited self-care, confined to bed or chair 50% or more of waking hours)  4 - Bedbound (Completely disabled. Cannot carry on any self-care. Totally confined to bed or chair)  5 - Death   Eustace Pen MM, Creech RH, Tormey DC, et al. [id number]). "Toxicity and response criteria of the [facility]". [place] Oncol. 5 (6): 649-55  LABORATORY DATA:  Lab Results  Component Value Date   WBC 5.0 06/20/2019   HGB 12.0 (L) 06/20/2019   HCT 37.9 (L) 06/20/2019   MCV 85.0 06/20/2019   PLT 169 06/20/2019   NEUTROABS 2.7 06/20/2019   Lab Results  Component Value Date   NA 136 06/20/2019   K 4.1 06/20/2019   CL 99 06/20/2019   CO2 27 06/20/2019   GLUCOSE 107 (H) 06/20/2019   CREATININE 0.67 06/20/2019   CALCIUM 9.2 06/20/2019      RADIOGRAPHY: CT Chest W Contrast  Result Date: 06/20/2019 CLINICAL DATA:  Right hip mass.  Question primary malignancy. EXAM: CT CHEST, ABDOMEN, AND PELVIS WITH CONTRAST TECHNIQUE: Multidetector CT imaging of the chest, abdomen and pelvis was performed following the standard protocol during bolus administration of intravenous contrast. CONTRAST:  19mL OMNIPAQUE IOHEXOL 300 MG/ML  SOLN COMPARISON:  CT right hip 06/13/2019. FINDINGS: CT CHEST FINDINGS Cardiovascular: Atherosclerotic calcification of the aorta, aortic valve and coronary arteries. Right and left pulmonary arteries and heart are enlarged. No pericardial effusion. Mediastinum/Nodes: Low right paratracheal lymph node measures 11 mm. No pathologically enlarged hilar or axillary lymph nodes. Esophagus is unremarkable. Lungs/Pleura: Image quality is somewhat degraded by respiratory motion. Ill-defined areas of peribronchovascular nodular consolidation in the lower lobes measure up to 1.1 x 1.6 cm in the superior segment right lower lobe (4/69). No pleural fluid. Airway is unremarkable.  Musculoskeletal: Large expansile mass centered at the first costochondral junction measures 6.1 x 7.7 cm (2/14). Multiple old bilateral rib fractures. Degenerative changes in the spine. CT ABDOMEN PELVIS FINDINGS Hepatobiliary: Liver is enlarged, measuring 19.7 cm and the margin is irregular. Multiple hyperattenuating lesions in both lobes of the liver measure up to 2.7 x 3.6 cm in the left hepatic lobe (2/55) small stones layer in the gallbladder. Gallbladder wall appears edematous and/or there is pericholecystic fluid. No biliary ductal dilatation. Pancreas: Negative. Spleen: Negative. Adrenals/Urinary Tract: Heterogeneous right adrenal mass measures 3.1 x 4.5 cm. Right kidney is unremarkable. There may be a punctate stone in the lower pole left kidney. Ureters are decompressed. Bladder is low in volume which may account for bladder wall thickening. Stomach/Bowel: Stomach, small bowel, appendix and colon are unremarkable. Vascular/Lymphatic: Atherosclerotic calcification of the aorta without aneurysm. No pathologically enlarged lymph nodes. Reproductive: Prostate is visualized.  Other: Scattered ascites. Mesenteries and peritoneum are otherwise unremarkable. Musculoskeletal: Large expansile mass in the proximal right femur measures approximately 9.1 x 15.6 cm and was better evaluated on 06/13/2019. Dynamic hip screw and IM rod in the right femur. Degenerative changes in the spine. L1 and L2 compression deformities are age indeterminate. IMPRESSION: 1. Cirrhosis with multiple hyperattenuating lesions in the liver may be due to multifocal high paddle cellular carcinoma or metastatic disease. 2. Right adrenal metastasis. 3. Large expansile lytic lesions of the right first costochondral junction and proximal right femur may be metastatic but chondrosarcoma is also considered. 4. Bilateral lower lobe ill-defined peribronchovascular nodular consolidation may represent metastatic disease. An infectious/inflammatory  etiology is not excluded. 5. Mild ascites. 6. Possible punctate left renal stone. 7. Aortic atherosclerosis (ICD10-I70.0). Coronary artery calcification. 8. Enlarged pulmonary arteries, indicative pulmonary arterial hypertension. Electronically Signed   By: Lorin Picket M.D.   On: 06/20/2019 09:59   CT Abdomen Pelvis W Contrast  Result Date: 06/20/2019 CLINICAL DATA:  Right hip mass.  Question primary malignancy. EXAM: CT CHEST, ABDOMEN, AND PELVIS WITH CONTRAST TECHNIQUE: Multidetector CT imaging of the chest, abdomen and pelvis was performed following the standard protocol during bolus administration of intravenous contrast. CONTRAST:  168mL OMNIPAQUE IOHEXOL 300 MG/ML  SOLN COMPARISON:  CT right hip 06/13/2019. FINDINGS: CT CHEST FINDINGS Cardiovascular: Atherosclerotic calcification of the aorta, aortic valve and coronary arteries. Right and left pulmonary arteries and heart are enlarged. No pericardial effusion. Mediastinum/Nodes: Low right paratracheal lymph node measures 11 mm. No pathologically enlarged hilar or axillary lymph nodes. Esophagus is unremarkable. Lungs/Pleura: Image quality is somewhat degraded by respiratory motion. Ill-defined areas of peribronchovascular nodular consolidation in the lower lobes measure up to 1.1 x 1.6 cm in the superior segment right lower lobe (4/69). No pleural fluid. Airway is unremarkable. Musculoskeletal: Large expansile mass centered at the first costochondral junction measures 6.1 x 7.7 cm (2/14). Multiple old bilateral rib fractures. Degenerative changes in the spine. CT ABDOMEN PELVIS FINDINGS Hepatobiliary: Liver is enlarged, measuring 19.7 cm and the margin is irregular. Multiple hyperattenuating lesions in both lobes of the liver measure up to 2.7 x 3.6 cm in the left hepatic lobe (2/55) small stones layer in the gallbladder. Gallbladder wall appears edematous and/or there is pericholecystic fluid. No biliary ductal dilatation. Pancreas: Negative. Spleen:  Negative. Adrenals/Urinary Tract: Heterogeneous right adrenal mass measures 3.1 x 4.5 cm. Right kidney is unremarkable. There may be a punctate stone in the lower pole left kidney. Ureters are decompressed. Bladder is low in volume which may account for bladder wall thickening. Stomach/Bowel: Stomach, small bowel, appendix and colon are unremarkable. Vascular/Lymphatic: Atherosclerotic calcification of the aorta without aneurysm. No pathologically enlarged lymph nodes. Reproductive: Prostate is visualized. Other: Scattered ascites. Mesenteries and peritoneum are otherwise unremarkable. Musculoskeletal: Large expansile mass in the proximal right femur measures approximately 9.1 x 15.6 cm and was better evaluated on 06/13/2019. Dynamic hip screw and IM rod in the right femur. Degenerative changes in the spine. L1 and L2 compression deformities are age indeterminate. IMPRESSION: 1. Cirrhosis with multiple hyperattenuating lesions in the liver may be due to multifocal high paddle cellular carcinoma or metastatic disease. 2. Right adrenal metastasis. 3. Large expansile lytic lesions of the right first costochondral junction and proximal right femur may be metastatic but chondrosarcoma is also considered. 4. Bilateral lower lobe ill-defined peribronchovascular nodular consolidation may represent metastatic disease. An infectious/inflammatory etiology is not excluded. 5. Mild ascites. 6. Possible punctate left renal stone. 7. Aortic atherosclerosis (  ICD10-I70.0). Coronary artery calcification. 8. Enlarged pulmonary arteries, indicative pulmonary arterial hypertension. Electronically Signed   By: Lorin Picket M.D.   On: 06/20/2019 09:59   CT HIP RIGHT WO CONTRAST  Result Date: 06/13/2019 CLINICAL DATA:  Right hip pain for 6 months. No recent injury or history of malignancy. History of right hip fracture with fixation in December 2015. EXAM: CT OF THE RIGHT HIP WITHOUT CONTRAST TECHNIQUE: Multidetector CT imaging of  the right hip was performed according to the standard protocol. Multiplanar CT image reconstructions were also generated. COMPARISON:  Right hip radiographs 02/19/2014, 04/26/2015 and 04/07/2019. FINDINGS: Bones/Joint/Cartilage Status post dynamic screw fixation of the right hip with a long femoral intramedullary nail, incompletely visualized distally. The visualized hardware is intact without loosening. However, there is a large destructive soft tissue mass involving the right greater trochanter with intertrochanteric extension into the inferior aspect of the right femoral neck. The soft tissue mass demonstrates significant extra osseous extension superiorly, lateral to the right hip, measuring up to 14.0 cm in height on coronal image 36/8. The soft tissue components measure up to 10.2 x 7.1 cm transverse on image 29/3. No pathologic fracture is identified at this time. No other lytic osseous lesions are seen. There are moderate osteophytes at the symphysis pubis. Ligaments Suboptimally assessed by CT. Muscles and Tendons Mild atrophy of the gluteus musculature. No acute tendon findings identified. Soft tissues As above, superior extension of a soft tissue mass from the greater trochanteric region, worrisome for malignancy. No other soft tissue masses are identified. Iliofemoral atherosclerosis noted. IMPRESSION: 1. Large destructive soft tissue mass involving the right greater trochanter with intertrochanteric extension into the inferior aspect of the right femoral neck, worrisome for malignancy. This mass is amenable to percutaneous biopsy. 2. No evidence of pathologic fracture or hardware loosening at this time. 3. These results will be called to the ordering clinician or representative by the Radiologist Assistant, and communication documented in the PACS or [employer]. Electronically Signed   By: Richardean Sale M.D.   On: 06/13/2019 10:54      IMPRESSION: Mass in the right hip - likely sarcoma.   Patient does have a prior history of melanoma along the back region apparently early stage as he did not receive any adjuvant treatment for this issue.  Patient is quite symptomatic from the large mass along the right pelvis.  He would be a good candidate for palliative radiation therapy directed to this area.  He does not appear to be symptomatic from his chest mass  Today, I talked to the patient and his wife about the findings and work-up thus far.  We discussed the natural history of sarcomas and general treatment, highlighting the role of radiotherapy in the management.  We discussed the available radiation techniques, and focused on the details of logistics and delivery.  We reviewed the anticipated acute and late sequelae associated with radiation in this setting.  The patient was encouraged to ask questions that I answered to the best of my ability.  A patient consent form was discussed and signed.  We retained a copy for our records.  The patient would like to proceed with radiation and will be scheduled for CT simulation.  PLAN: Core biopsy is scheduled for 07/04/2019. The patient is scheduled to follow-up with Dr. Marin Olp on 07/08/2019 to discuss the results and plan further treatment.  Patient will proceed with simulation for treatments directed at the right pelvic mass this afternoon.  Anticipate 15 treatments. He  will begin his radiation therapy March 13.  Biopsy results should be  available March 12.    ------------------------------------------------  Blair Promise, PhD, MD  This document serves as a record of services personally performed by Gery Pray, MD. It was created on his behalf by Clerance Lav, a trained medical scribe. The creation of this record is based on the scribe's personal observations and the provider's statements to them. This document has been checked and approved by the attending provider.

## 2019-07-01 ENCOUNTER — Telehealth: Payer: Self-pay | Admitting: General Practice

## 2019-07-01 ENCOUNTER — Encounter: Payer: Self-pay | Admitting: *Deleted

## 2019-07-01 ENCOUNTER — Other Ambulatory Visit: Payer: Self-pay | Admitting: Radiology

## 2019-07-01 MED ORDER — METOPROLOL TARTRATE 50 MG PO TABS: 50.0000 mg | ORAL_TABLET | Freq: Two times a day (BID) | ORAL | 1 refills | Status: DC

## 2019-07-01 NOTE — Telephone Encounter (Signed)
[place] Psychosocial Distress Screening Clinical Social Work  Clinical Social Work was referred by distress screening protocol.  The patient scored a 5 on the Psychosocial Distress Thermometer which indicates moderate distress. Clinical Social Worker contacted patient by phone to assess for distress and other psychosocial needs. Unable to reach, left generic VM w contact information and encouragement to call back to engage w Industry resources.    ONCBCN DISTRESS SCREENING 06/30/2019  Screening Type Initial Screening  Distress experienced in past week (1-10) 5  Information Concerns Type Lack of info about diagnosis;Lack of info about treatment;Lack of info about complementary therapy choices;Lack of info about maintaining fitness  Physical Problem type Pain;Getting around;Constipation/diarrhea;Skin dry/itchy;Swollen arms/legs  Referral to clinical social work Yes  Other Can contact patient  and  his wife at home.    Clinical Social Worker follow up needed: Yes.   Attempt to call again.   If yes, follow up plan:  Beverely Pace, LCSW

## 2019-07-04 ENCOUNTER — Ambulatory Visit (HOSPITAL_COMMUNITY)
Admission: RE | Admit: 2019-07-04 | Discharge: 2019-07-04 | Disposition: A | Discharge status: Home / Self Care | Payer: Medicare Other | Source: Ambulatory Visit | Attending: Hematology & Oncology | Admitting: Hematology & Oncology

## 2019-07-04 ENCOUNTER — Other Ambulatory Visit: Payer: Self-pay

## 2019-07-04 ENCOUNTER — Encounter (HOSPITAL_COMMUNITY): Payer: Self-pay

## 2019-07-04 DIAGNOSIS — R2241 Localized swelling, mass and lump, right lower limb: Secondary | ICD-10-CM | POA: Diagnosis present

## 2019-07-04 DIAGNOSIS — I10 Essential (primary) hypertension: Secondary | ICD-10-CM | POA: Insufficient documentation

## 2019-07-04 DIAGNOSIS — Z8249 Family history of ischemic heart disease and other diseases of the circulatory system: Secondary | ICD-10-CM | POA: Insufficient documentation

## 2019-07-04 DIAGNOSIS — Z7901 Long term (current) use of anticoagulants: Secondary | ICD-10-CM | POA: Insufficient documentation

## 2019-07-04 DIAGNOSIS — Z79899 Other long term (current) drug therapy: Secondary | ICD-10-CM | POA: Diagnosis not present

## 2019-07-04 DIAGNOSIS — K7460 Unspecified cirrhosis of liver: Secondary | ICD-10-CM | POA: Diagnosis not present

## 2019-07-04 DIAGNOSIS — K219 Gastro-esophageal reflux disease without esophagitis: Secondary | ICD-10-CM | POA: Insufficient documentation

## 2019-07-04 DIAGNOSIS — C220 Liver cell carcinoma: Secondary | ICD-10-CM | POA: Insufficient documentation

## 2019-07-04 DIAGNOSIS — Z96653 Presence of artificial knee joint, bilateral: Secondary | ICD-10-CM | POA: Diagnosis not present

## 2019-07-04 DIAGNOSIS — Z510 Encounter for antineoplastic radiation therapy: Secondary | ICD-10-CM | POA: Diagnosis not present

## 2019-07-04 DIAGNOSIS — I4891 Unspecified atrial fibrillation: Secondary | ICD-10-CM | POA: Diagnosis not present

## 2019-07-04 DIAGNOSIS — I2510 Atherosclerotic heart disease of native coronary artery without angina pectoris: Secondary | ICD-10-CM | POA: Insufficient documentation

## 2019-07-04 DIAGNOSIS — C7971 Secondary malignant neoplasm of right adrenal gland: Secondary | ICD-10-CM | POA: Insufficient documentation

## 2019-07-04 DIAGNOSIS — E785 Hyperlipidemia, unspecified: Secondary | ICD-10-CM | POA: Diagnosis not present

## 2019-07-04 DIAGNOSIS — Z823 Family history of stroke: Secondary | ICD-10-CM | POA: Insufficient documentation

## 2019-07-04 HISTORY — DX: Cardiac arrhythmia, unspecified: I49.9

## 2019-07-04 LAB — CBC WITH DIFFERENTIAL/PLATELET
Abs Immature Granulocytes: 0.01 10*3/uL (ref 0.00–0.07)
Basophils Absolute: 0.1 10*3/uL (ref 0.0–0.1)
Basophils Relative: 2 %
Eosinophils Absolute: 0.1 10*3/uL (ref 0.0–0.5)
Eosinophils Relative: 2 %
HCT: 38.2 % — ABNORMAL LOW (ref 39.0–52.0)
Hemoglobin: 12.2 g/dL — ABNORMAL LOW (ref 13.0–17.0)
Immature Granulocytes: 0 %
Lymphocytes Relative: 21 %
Lymphs Abs: 0.9 10*3/uL (ref 0.7–4.0)
MCH: 26.9 pg (ref 26.0–34.0)
MCHC: 31.9 g/dL (ref 30.0–36.0)
MCV: 84.1 fL (ref 80.0–100.0)
Monocytes Absolute: 0.8 10*3/uL (ref 0.1–1.0)
Monocytes Relative: 19 %
Neutro Abs: 2.6 10*3/uL (ref 1.7–7.7)
Neutrophils Relative %: 56 %
Platelets: 220 10*3/uL (ref 150–400)
RBC: 4.54 MIL/uL (ref 4.22–5.81)
RDW: 19.5 % — ABNORMAL HIGH (ref 11.5–15.5)
WBC: 4.5 10*3/uL (ref 4.0–10.5)
nRBC: 0 % (ref 0.0–0.2)

## 2019-07-04 LAB — PROTIME-INR
INR: 1.2 (ref 0.8–1.2)
Prothrombin Time: 14.5 seconds (ref 11.4–15.2)

## 2019-07-04 MED ORDER — NALOXONE HCL 0.4 MG/ML IJ SOLN
INTRAMUSCULAR | Status: AC
  Filled 2019-07-04: qty 1

## 2019-07-04 MED ORDER — FENTANYL CITRATE (PF) 100 MCG/2ML IJ SOLN
INTRAMUSCULAR | Status: AC | PRN
  Administered 2019-07-04 (×2): 50 ug via INTRAVENOUS

## 2019-07-04 MED ORDER — SODIUM CHLORIDE 0.9 % IV SOLN: INTRAVENOUS | Status: DC

## 2019-07-04 MED ORDER — LIDOCAINE HCL (PF) 1 % IJ SOLN
INTRAMUSCULAR | Status: AC | PRN
  Administered 2019-07-04: 10 mL via INTRADERMAL

## 2019-07-04 MED ORDER — MIDAZOLAM HCL 2 MG/2ML IJ SOLN
INTRAMUSCULAR | Status: AC | PRN
  Administered 2019-07-04: 1 mg via INTRAVENOUS

## 2019-07-04 MED ORDER — MIDAZOLAM HCL 2 MG/2ML IJ SOLN
INTRAMUSCULAR | Status: AC | PRN
  Administered 2019-07-04 (×3): 1 mg via INTRAVENOUS

## 2019-07-04 MED ORDER — MIDAZOLAM HCL 2 MG/2ML IJ SOLN
INTRAMUSCULAR | Status: AC
  Filled 2019-07-04: qty 4

## 2019-07-04 MED ORDER — FENTANYL CITRATE (PF) 100 MCG/2ML IJ SOLN
INTRAMUSCULAR | Status: AC
  Filled 2019-07-04: qty 2

## 2019-07-04 MED ORDER — HYDROCODONE-ACETAMINOPHEN 5-325 MG PO TABS: 1.0000 | ORAL_TABLET | ORAL | Status: DC | PRN

## 2019-07-04 MED ORDER — FLUMAZENIL 0.5 MG/5ML IV SOLN
INTRAVENOUS | Status: AC
  Filled 2019-07-04: qty 5

## 2019-07-04 NOTE — Discharge Instructions (Signed)
Biopsy Discharge Instructions  The procedure you just had is called a biopsy.  You may feel some discomfort after the local anesthetic wears off.  Your discomfort should improve over the next several days.  AFTER YOUR BIOPSY  Rest for the remainder of the day.  Avoid heavy lifting (more than 10 lb/4.5 kg).  If you have been given a general anesthetic or other medications to help you relax, you should not operate machinery, drive or make legal decisions for 24 hours after your procedure.  Additionally, someone must be available to drive you home.  Only take over-the-counter or prescription medicines for pain, discomfort, or fever as directed by your caregiver.  This can make bleeding worse.  You may resume your usual diet after the procedure.  Avoid alcoholic beverages for 24 hours after your procedure.  Keep the skin around your biopsy site clean and dry.  You may shower after 24 hours.  Cleanse and dry the biopsy site completely after you shower.  Avoid baths and swimming for 72 hours.  Complications are very uncommon after this procedure.  Go to the nearest Emergency Department or contact your caregiver if you develop any of the following symptoms:  Worsening pain  Bleeding  Swelling at the biopsy site  Light headedness or dizziness  Shortness of Breath  Fever or chills Redness or increased pain or swelling at the biopsy site   Moderate Conscious Sedation, Adult, Care After These instructions provide you with information about caring for yourself after your procedure. Your health care provider may also give you more specific instructions. Your treatment has been planned according to current medical practices, but problems sometimes occur. Call your health care provider if you have any problems or questions after your procedure. What can I expect after the procedure? After your procedure, it is common:  To feel sleepy for several hours.  To feel clumsy and have poor balance  for several hours.  To have poor judgment for several hours.  To vomit if you eat too soon. Follow these instructions at home: For at least 24 hours after the procedure:   Do not: ? Participate in activities where you could fall or become injured. ? Drive. ? Use heavy machinery. ? Drink alcohol. ? Take sleeping pills or medicines that cause drowsiness. ? Make important decisions or sign legal documents. ? Take care of children on your own.  Rest. Eating and drinking  Follow the diet recommended by your health care provider.  If you vomit: ? Drink water, juice, or soup when you can drink without vomiting. ? Make sure you have little or no nausea before eating solid foods. General instructions  Have a responsible adult stay with you until you are awake and alert.  Take over-the-counter and prescription medicines only as told by your health care provider.  If you smoke, do not smoke without supervision.  Keep all follow-up visits as told by your health care provider. This is important. Contact a health care provider if:  You keep feeling nauseous or you keep vomiting.  You feel light-headed.  You develop a rash.  You have a fever. Get help right away if:  You have trouble breathing. This information is not intended to replace advice given to you by your health care provider. Make sure you discuss any questions you have with your health care provider. Document Revised: 01/23/2017 Document Reviewed: 06/02/2015 Elsevier Patient Education  2020 [employer]  

## 2019-07-04 NOTE — Consult Note (Signed)
Chief Complaint: Patient was seen in consultation today for CT-guided biopsy of right pelvic/hip mass  Referring Physician(s): Ennever,Peter R  Supervising Physician: Markus Daft  Patient Status: [facility] - Out-pt  History of Present Illness: Kerry Adams is a 64 y.o. male with past medical history of atrial fibrillation( on Xarelto), coronary artery disease, cirrhosis, GERD, hypertension, prior right hip fracture with pinning/intertrochanteric nail 2015, hyperlipidemia who now presents with worsening right hip pain and latest imaging findings revealing:  1. Cirrhosis with multiple hyperattenuating lesions in the liver may be due to multifocal hepatocellular carcinoma or metastatic disease. 2. Right adrenal metastasis. 3. Large expansile lytic lesions of the right first costochondral junction and proximal right femur may be metastatic but chondrosarcoma is also considered. 4. Bilateral lower lobe ill-defined peribronchovascular nodular consolidation may represent metastatic disease. An infectious/inflammatory etiology is not excluded. 5. Mild ascites. 6. Possible punctate left renal stone. 7. Aortic atherosclerosis (ICD10-I70.0). Coronary artery Calcification.  He has no prior history of cancer.  He presents today for CT-guided biopsy of the right hip mass for further evaluation.   Past Medical History:  Diagnosis Date  . Abnormal EKG   . Alcohol use 02/18/2014  . Arthritis    osteo knees and hips   . Atrial fibrillation ([place])   . CAD (coronary artery disease)    WITHOUT ANGINA PECTORIS, UNSPECIFIED VESSEL OR LESION TYPE, UNSPECIFIED WHETHER NATIVE OR TRANSPLANTED HEART  . 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 Chronic liver disease   . Cirrhosis ([place])   . Closed right hip fracture ([place]) 02/18/2014  . Dizziness   . Dysrhythmia   . Elevated liver function tests   . GERD (gastroesophageal reflux disease)    treats with OTC Ranitidine  . Goals of care,  counseling/discussion 06/20/2019  . Hip fracture requiring operative repair ([place]) 02/19/2014  . Hyperlipidemia   . Hypertension   . Metastasis to right adrenal gland of unknown origin ([place]) 06/20/2019  . 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 Osteoarthritis of knee, unilateral 03/01/2015  . Primary osteoarthritis of right knee 05/23/2015  . Thrombocytopenia ([place])     Past Surgical History:  Procedure Laterality Date  . CARDIOVERSION N/A 09/13/2018   Procedure: CARDIOVERSION;  Surgeon: Acie Fredrickson Wonda Cheng, MD;  Location: [facility] ENDOSCOPY;  Service: Cardiovascular;  Laterality: N/A;  . FRACTURE SURGERY     right femur  . HERNIA REPAIR     umbilical  . INTERTROCHANTERIC HIP FRACTURE SURGERY Right 02/19/2014  . INTRAMEDULLARY (IM) NAIL INTERTROCHANTERIC Right 02/19/2014   Procedure: INTRAMEDULLARY (IM) NAIL INTERTROCHANTRIC;  Surgeon: [name], MD;  Location: [place];  Service: Orthopedics;  Laterality: Right;  . SHOULDER SURGERY Right   . TOTAL KNEE ARTHROPLASTY Left 03/01/2015   Procedure: TOTAL KNEE ARTHROPLASTY;  Surgeon: [name], MD;  Location: [place];  Service: Orthopedics;  Laterality: Left;  Left knee arthroplasty  . TOTAL KNEE ARTHROPLASTY Right 05/23/2015   Procedure: TOTAL KNEE ARTHROPLASTY W/HARDWARE REMOVAL FEMUR;  Surgeon: Frederik Pear, MD;  Location: [place];  Service: Orthopedics;  Laterality: Right;    Allergies: Patient has no known allergies.  Medications: Prior to Admission medications   Medication Sig Start Date End Date Taking? Authorizing Provider  acetaminophen (TYLENOL) 500 MG tablet Take 500 mg by mouth every 8 (eight) hours as needed (pain).   Yes [provider]  amLODipine (NORVASC) 5 MG tablet Take 1 tablet (5 mg total) by mouth daily.  07/12/18 07/04/19 Yes Fay Records, MD  famotidine (PEPCID) 10 MG tablet Take 10 mg by mouth every morning.    Yes [provider]    loperamide (IMODIUM A-D) 2 MG tablet Take 1 mg by mouth at bedtime as needed for diarrhea or loose stools. Takes 1/2 tablet total of 1 mg at bedtime.   Yes [provider]  metoprolol tartrate (LOPRESSOR) 50 MG tablet Take 1 tablet (50 mg total) by mouth 2 (two) times daily. 07/01/19  Yes Camnitz, Will Hassell Done, MD  Potassium 99 MG TABS Take 99 mg by mouth daily.   Yes [provider]  Pseudoeph-Doxylamine-DM-APAP (NYQUIL MULTI-SYMPTOM PO) Take 1 capsule by mouth at bedtime.   Yes [provider]  trolamine salicylate (ASPERCREME) 10 % cream Apply 1 application topically as needed for muscle pain.   Yes [provider]  amoxicillin (AMOXIL) 500 MG capsule Take 2,000 mg by mouth as directed. Before dental work    [provider]  rivaroxaban (XARELTO) 20 MG TABS tablet Take 1 tablet (20 mg total) by mouth daily with supper. 07/12/18   Fay Records, MD  traMADol (ULTRAM) 50 MG tablet Take 1 tablet (50 mg total) by mouth every 6 (six) hours as needed. Patient not taking: Reported on 06/30/2019 06/20/19   Volanda Napoleon, MD     Family History  Problem Relation Age of Onset  . Hypertension Mother   . Stroke Father   . Healthy Brother     Social History   Socioeconomic History  . Marital status: Married    Spouse name: Not on file  . Number of children: Not on file  . Years of education: Not on file  . Highest education level: Not on file  Occupational History  . Not on file  Tobacco Use  . Smoking status: Never Smoker  . Smokeless tobacco: Never Used  Substance and Sexual Activity  . Alcohol use: Yes    Alcohol/week: 32.0 standard drinks    Types: 32 Cans of beer per week    Comment: 6 beers /day  . Drug use: No  . Sexual activity: Not on file  Other Topics Concern  . Not on file  Social History Narrative  . Not on file   Social Determinants of Health   Financial Resource Strain:   . Difficulty of Paying Living Expenses:   Food  Insecurity:   . Worried About Charity fundraiser in the Last Year:   . Arboriculturist in the Last Year:   Transportation Needs:   . Film/video editor (Medical):   Marland Kitchen Lack of Transportation (Non-Medical):   Physical Activity:   . Days of Exercise per Week:   . Minutes of Exercise per Session:   Stress:   . Feeling of Stress :   Social Connections:   . Frequency of Communication with Friends and Family:   . Frequency of Social Gatherings with Friends and Family:   . Attends Religious Services:   . Active Member of Clubs or Organizations:   . Attends Archivist Meetings:   Marland Kitchen Marital Status:       Review of Systems denies fever, headache, chest pain, dyspnea, cough, abdominal pain, back pain, nausea, vomiting or bleeding.  He does have right lateral hip pain  Vital Signs: BP 122/87   Pulse (!) 101   Temp 98.3 F (36.8 C) (Oral)   Resp 18   SpO2 99%   Physical Exam awake,  alert.  Chest clear to auscultation bilaterally.  Heart with slightly tachycardic rate, irregular rhythm.  Abdomen soft, positive bowel sounds, slight distention.  Bilateral lower extremity edema noted.  Imaging: CT Chest W Contrast  Result Date: 06/20/2019 CLINICAL DATA:  Right hip mass.  Question primary malignancy. EXAM: CT CHEST, ABDOMEN, AND PELVIS WITH CONTRAST TECHNIQUE: Multidetector CT imaging of the chest, abdomen and pelvis was performed following the standard protocol during bolus administration of intravenous contrast. CONTRAST:  11mL OMNIPAQUE IOHEXOL 300 MG/ML  SOLN COMPARISON:  CT right hip 06/13/2019. FINDINGS: CT CHEST FINDINGS Cardiovascular: Atherosclerotic calcification of the aorta, aortic valve and coronary arteries. Right and left pulmonary arteries and heart are enlarged. No pericardial effusion. Mediastinum/Nodes: Low right paratracheal lymph node measures 11 mm. No pathologically enlarged hilar or axillary lymph nodes. Esophagus is unremarkable. Lungs/Pleura: Image quality  is somewhat degraded by respiratory motion. Ill-defined areas of peribronchovascular nodular consolidation in the lower lobes measure up to 1.1 x 1.6 cm in the superior segment right lower lobe (4/69). No pleural fluid. Airway is unremarkable. Musculoskeletal: Large expansile mass centered at the first costochondral junction measures 6.1 x 7.7 cm (2/14). Multiple old bilateral rib fractures. Degenerative changes in the spine. CT ABDOMEN PELVIS FINDINGS Hepatobiliary: Liver is enlarged, measuring 19.7 cm and the margin is irregular. Multiple hyperattenuating lesions in both lobes of the liver measure up to 2.7 x 3.6 cm in the left hepatic lobe (2/55) small stones layer in the gallbladder. Gallbladder wall appears edematous and/or there is pericholecystic fluid. No biliary ductal dilatation. Pancreas: Negative. Spleen: Negative. Adrenals/Urinary Tract: Heterogeneous right adrenal mass measures 3.1 x 4.5 cm. Right kidney is unremarkable. There may be a punctate stone in the lower pole left kidney. Ureters are decompressed. Bladder is low in volume which may account for bladder wall thickening. Stomach/Bowel: Stomach, small bowel, appendix and colon are unremarkable. Vascular/Lymphatic: Atherosclerotic calcification of the aorta without aneurysm. No pathologically enlarged lymph nodes. Reproductive: Prostate is visualized. Other: Scattered ascites. Mesenteries and peritoneum are otherwise unremarkable. Musculoskeletal: Large expansile mass in the proximal right femur measures approximately 9.1 x 15.6 cm and was better evaluated on 06/13/2019. Dynamic hip screw and IM rod in the right femur. Degenerative changes in the spine. L1 and L2 compression deformities are age indeterminate. IMPRESSION: 1. Cirrhosis with multiple hyperattenuating lesions in the liver may be due to multifocal high paddle cellular carcinoma or metastatic disease. 2. Right adrenal metastasis. 3. Large expansile lytic lesions of the right first  costochondral junction and proximal right femur may be metastatic but chondrosarcoma is also considered. 4. Bilateral lower lobe ill-defined peribronchovascular nodular consolidation may represent metastatic disease. An infectious/inflammatory etiology is not excluded. 5. Mild ascites. 6. Possible punctate left renal stone. 7. Aortic atherosclerosis (ICD10-I70.0). Coronary artery calcification. 8. Enlarged pulmonary arteries, indicative pulmonary arterial hypertension. Electronically Signed   By: Lorin Picket M.D.   On: 06/20/2019 09:59   CT Abdomen Pelvis W Contrast  Result Date: 06/20/2019 CLINICAL DATA:  Right hip mass.  Question primary malignancy. EXAM: CT CHEST, ABDOMEN, AND PELVIS WITH CONTRAST TECHNIQUE: Multidetector CT imaging of the chest, abdomen and pelvis was performed following the standard protocol during bolus administration of intravenous contrast. CONTRAST:  157mL OMNIPAQUE IOHEXOL 300 MG/ML  SOLN COMPARISON:  CT right hip 06/13/2019. FINDINGS: CT CHEST FINDINGS Cardiovascular: Atherosclerotic calcification of the aorta, aortic valve and coronary arteries. Right and left pulmonary arteries and heart are enlarged. No pericardial effusion. Mediastinum/Nodes: Low right paratracheal lymph node measures 11 mm. No pathologically  enlarged hilar or axillary lymph nodes. Esophagus is unremarkable. Lungs/Pleura: Image quality is somewhat degraded by respiratory motion. Ill-defined areas of peribronchovascular nodular consolidation in the lower lobes measure up to 1.1 x 1.6 cm in the superior segment right lower lobe (4/69). No pleural fluid. Airway is unremarkable. Musculoskeletal: Large expansile mass centered at the first costochondral junction measures 6.1 x 7.7 cm (2/14). Multiple old bilateral rib fractures. Degenerative changes in the spine. CT ABDOMEN PELVIS FINDINGS Hepatobiliary: Liver is enlarged, measuring 19.7 cm and the margin is irregular. Multiple hyperattenuating lesions in both lobes  of the liver measure up to 2.7 x 3.6 cm in the left hepatic lobe (2/55) small stones layer in the gallbladder. Gallbladder wall appears edematous and/or there is pericholecystic fluid. No biliary ductal dilatation. Pancreas: Negative. Spleen: Negative. Adrenals/Urinary Tract: Heterogeneous right adrenal mass measures 3.1 x 4.5 cm. Right kidney is unremarkable. There may be a punctate stone in the lower pole left kidney. Ureters are decompressed. Bladder is low in volume which may account for bladder wall thickening. Stomach/Bowel: Stomach, small bowel, appendix and colon are unremarkable. Vascular/Lymphatic: Atherosclerotic calcification of the aorta without aneurysm. No pathologically enlarged lymph nodes. Reproductive: Prostate is visualized. Other: Scattered ascites. Mesenteries and peritoneum are otherwise unremarkable. Musculoskeletal: Large expansile mass in the proximal right femur measures approximately 9.1 x 15.6 cm and was better evaluated on 06/13/2019. Dynamic hip screw and IM rod in the right femur. Degenerative changes in the spine. L1 and L2 compression deformities are age indeterminate. IMPRESSION: 1. Cirrhosis with multiple hyperattenuating lesions in the liver may be due to multifocal high paddle cellular carcinoma or metastatic disease. 2. Right adrenal metastasis. 3. Large expansile lytic lesions of the right first costochondral junction and proximal right femur may be metastatic but chondrosarcoma is also considered. 4. Bilateral lower lobe ill-defined peribronchovascular nodular consolidation may represent metastatic disease. An infectious/inflammatory etiology is not excluded. 5. Mild ascites. 6. Possible punctate left renal stone. 7. Aortic atherosclerosis (ICD10-I70.0). Coronary artery calcification. 8. Enlarged pulmonary arteries, indicative pulmonary arterial hypertension. Electronically Signed   By: Lorin Picket M.D.   On: 06/20/2019 09:59   CT HIP RIGHT WO CONTRAST  Result Date:  06/13/2019 CLINICAL DATA:  Right hip pain for 6 months. No recent injury or history of malignancy. History of right hip fracture with fixation in December 2015. EXAM: CT OF THE RIGHT HIP WITHOUT CONTRAST TECHNIQUE: Multidetector CT imaging of the right hip was performed according to the standard protocol. Multiplanar CT image reconstructions were also generated. COMPARISON:  Right hip radiographs 02/19/2014, 04/26/2015 and 04/07/2019. FINDINGS: Bones/Joint/Cartilage Status post dynamic screw fixation of the right hip with a long femoral intramedullary nail, incompletely visualized distally. The visualized hardware is intact without loosening. However, there is a large destructive soft tissue mass involving the right greater trochanter with intertrochanteric extension into the inferior aspect of the right femoral neck. The soft tissue mass demonstrates significant extra osseous extension superiorly, lateral to the right hip, measuring up to 14.0 cm in height on coronal image 36/8. The soft tissue components measure up to 10.2 x 7.1 cm transverse on image 29/3. No pathologic fracture is identified at this time. No other lytic osseous lesions are seen. There are moderate osteophytes at the symphysis pubis. Ligaments Suboptimally assessed by CT. Muscles and Tendons Mild atrophy of the gluteus musculature. No acute tendon findings identified. Soft tissues As above, superior extension of a soft tissue mass from the greater trochanteric region, worrisome for malignancy. No other soft tissue masses  are identified. Iliofemoral atherosclerosis noted. IMPRESSION: 1. Large destructive soft tissue mass involving the right greater trochanter with intertrochanteric extension into the inferior aspect of the right femoral neck, worrisome for malignancy. This mass is amenable to percutaneous biopsy. 2. No evidence of pathologic fracture or hardware loosening at this time. 3. These results will be called to the ordering clinician or  representative by the Radiologist Assistant, and communication documented in the PACS or [employer]. Electronically Signed   By: Richardean Sale M.D.   On: 06/13/2019 10:54    Labs:  CBC: Recent Labs    09/10/18 1055 04/19/19 0954 06/20/19 0945 07/04/19 0930  WBC 5.7 5.9 5.0 4.5  HGB 15.5 12.1* 12.0* 12.2*  HCT 43.3 37.7 37.9* 38.2*  PLT 140* 203 169 220    COAGS: Recent Labs    07/04/19 0930  INR 1.2    BMP: Recent Labs    07/14/18 0849 09/10/18 1055 05/13/19 1110 06/20/19 0945  NA 141 136 137 136  K 4.5 4.4 4.3 4.1  CL 102 101 100 99  CO2 22 21 22 27   GLUCOSE 109* 127* 107* 107*  BUN 6* 9 7* 8  CALCIUM 9.7 9.4 8.8 9.2  CREATININE 0.68* 0.62* 0.77 0.67  GFRNONAA 102 105 96 >60  GFRAA 117 121 111 >60    LIVER FUNCTION TESTS: Recent Labs    07/14/18 0849 07/23/18 0833 06/20/19 0945  BILITOT 1.5* 1.3* 1.9*  AST 108* 100* 61*  ALT 61* 51* 23  ALKPHOS 185* 156* 127*  PROT 7.5 6.8 6.6  ALBUMIN 4.2 4.0 3.6    TUMOR MARKERS: No results for input(s): AFPTM, CEA, CA199, CHROMGRNA in the last 8760 hours.  Assessment and Plan: 64 y.o. male with past medical history of atrial fibrillation( on Xarelto), coronary artery disease, cirrhosis, GERD, hypertension, prior right hip fracture with pinning/intertrochanteric nail 2015, hyperlipidemia who now presents with worsening right hip pain and latest imaging findings revealing:  1. Cirrhosis with multiple hyperattenuating lesions in the liver may be due to multifocal hepatocellular carcinoma or metastatic disease. 2. Right adrenal metastasis. 3. Large expansile lytic lesions of the right first costochondral junction and proximal right femur may be metastatic but chondrosarcoma is also considered. 4. Bilateral lower lobe ill-defined peribronchovascular nodular consolidation may represent metastatic disease. An infectious/inflammatory etiology is not excluded. 5. Mild ascites. 6. Possible punctate left  renal stone. 7. Aortic atherosclerosis (ICD10-I70.0). Coronary artery Calcification.  He has no prior history of cancer.  He presents today for CT-guided biopsy of the right hip mass for further evaluation.Risks and benefits of procedure was discussed with the patient  including, but not limited to bleeding, infection, damage to adjacent structures or low yield requiring additional tests.  All of the questions were answered and there is agreement to proceed.  Consent signed and in chart.     Thank you for this interesting consult.  I greatly enjoyed meeting Kerry Adams and look forward to participating in their care.  A copy of this report was sent to the requesting provider on this date.  Electronically Signed: D. Rowe Robert, PA-C 07/04/2019, 10:33 AM   I spent a total of 25 minutes    in face to face in clinical consultation, greater than 50% of which was counseling/coordinating care for CT-guided biopsy of right hip mass

## 2019-07-04 NOTE — Procedures (Signed)
Interventional Radiology Procedure:   Indications: Right hip mass  Procedure: CT guided mass biopsy  Findings: Large soft tissue mass in right hip/pelvis  Complications: None     EBL: less than 20 ml  Plan: Discharge to home   Ong. Anselm Pancoast, MD  Pager: [phone]

## 2019-07-06 ENCOUNTER — Encounter: Payer: Self-pay | Admitting: *Deleted

## 2019-07-06 LAB — SURGICAL PATHOLOGY

## 2019-07-06 NOTE — Progress Notes (Signed)
  Radiation Oncology         [phone] ________________________________  Name: Kerry Adams MRN: [id number]  Date: 07/07/2019  DOB: [birth year]  Simulation Verification Note    ICD-10-CM   1. Hepatocellular carcinoma metastatic to bone (HCC)  C79.51    C22.0     NARRATIVE: The patient was brought to the treatment unit and placed in the planned treatment position. The clinical setup was verified. Then port films were obtained and uploaded to the radiation oncology medical record software.  The treatment beams were carefully compared against the planned radiation fields. The position location and shape of the radiation fields was reviewed. They targeted volume of tissue appears to be appropriately covered by the radiation beams. Organs at risk appear to be excluded as planned.  Based on my personal review, I approved the simulation verification. The patient's treatment will proceed as planned.  -----------------------------------  Blair Promise, PhD, MD  This document serves as a record of services personally performed by Gery Pray, MD. It was created on his behalf by Clerance Lav, a trained medical scribe. The creation of this record is based on the scribe's personal observations and the provider's statements to them. This document has been checked and approved by the attending provider.

## 2019-07-06 NOTE — Progress Notes (Signed)
Called Suanne Marker in pathology and requested PDL1 on specimen [phone] per Dr Marin Olp

## 2019-07-07 ENCOUNTER — Ambulatory Visit
Admission: RE | Admit: 2019-07-07 | Discharge: 2019-07-07 | Disposition: A | Discharge status: Home / Self Care | Payer: Medicare Other | Source: Ambulatory Visit | Attending: Radiation Oncology | Admitting: Radiation Oncology

## 2019-07-07 ENCOUNTER — Encounter: Payer: Self-pay | Admitting: General Practice

## 2019-07-07 ENCOUNTER — Other Ambulatory Visit: Payer: Self-pay

## 2019-07-07 ENCOUNTER — Other Ambulatory Visit: Payer: Self-pay | Admitting: *Deleted

## 2019-07-07 DIAGNOSIS — Z510 Encounter for antineoplastic radiation therapy: Secondary | ICD-10-CM | POA: Diagnosis not present

## 2019-07-07 DIAGNOSIS — C787 Secondary malignant neoplasm of liver and intrahepatic bile duct: Secondary | ICD-10-CM

## 2019-07-07 DIAGNOSIS — R2241 Localized swelling, mass and lump, right lower limb: Secondary | ICD-10-CM

## 2019-07-07 DIAGNOSIS — C499 Malignant neoplasm of connective and soft tissue, unspecified: Secondary | ICD-10-CM

## 2019-07-07 DIAGNOSIS — C7801 Secondary malignant neoplasm of right lung: Secondary | ICD-10-CM

## 2019-07-07 DIAGNOSIS — C7951 Secondary malignant neoplasm of bone: Secondary | ICD-10-CM

## 2019-07-07 DIAGNOSIS — C220 Liver cell carcinoma: Secondary | ICD-10-CM

## 2019-07-07 NOTE — Progress Notes (Signed)
[facility] Initial Psychosocial Assessment Clinical Social Work  Clinical Social Work contacted by phone to assess psychosocial, emotional, mental health, and spiritual needs of the patient.   Barriers to care/review of distress screen:  - Transportation:  Do you anticipate any problems getting to appointments?  Do you have someone who can help run errands for you if you need it?  No issues, uses walker due hip.  Wife will drive. - Help at home:  What is your living situation (alone, family, other)?  If you are physically unable to care for yourself, who would you call on to help you?  Lives w wife.   - Support system:  What does your support system look like?  Who would you call on if you needed some kind of practical help?  What if you needed someone to talk to for emotional support?  Neighbors have been helpful - Finances:  Are you concerned about finances.  Considering returning to work?  If not, applying for disability?   Is retired, has [employer].  Bills are current.  No financial needs.  What is your understanding of where you are with your cancer? Its cause?  Your treatment plan and what happens next?  Patient reports he is sore from recent biopsy.  Otherwise doing well.  Came to attention of oncologist because "I wanted to find out what was wrong w my hip."  Aware that he has "some kind of a tumor."  "I am dealing with it, I am hoping maybe something good will come out of it."  "Taking it one day at a time."  Pain in hip is limiting his ability to be outside and do things he enjoys in yard/garden - hopes for pain relief  What are your worries for the future as you begin treatment for cancer?  "I don't know right now, I cant figure it out right now."  Is on Xgeva and radiation. Aware he will get more information when he talks w oncologist post biopsy.  What are your hopes and priorities during your treatment? What is important to you? What are your goals for your care?  Loves to be outside, doing  yard work and working in his garden.  He wants to take trip to beach/mountains w wife when he is able to do so.  Wants to be able to work in yard and get out of the house.    CSW Summary:  Patient and family psychosocial functioning including strengths, limitations, and coping skills:  64 year old married male, newly diagnosed w chondrosarcoma of femur, metastatic to liver and lung.  Will have radiation for pain relief, beginning today.  Is also on Xgeva.   Had biopsy recently.  Lives w wife who is able to support him as needed.  No financial or transportation concerns.  CSW and patient discussed common feeling and emotions when being diagnosed with cancer, and the importance of support during treatment. CSW informed patient of the support team and support services at [facility]. CSW  encouraged patient to call with any questions or concerns as they arise.  Identifications of barriers to care:  None noted  Availability of community resources: None needed  Clinical Social Worker follow up needed: No.   Edwyna Shell, [place] Worker Phone:  [phone] Cell:  [phone]

## 2019-07-08 ENCOUNTER — Ambulatory Visit
Admission: RE | Admit: 2019-07-08 | Discharge: 2019-07-08 | Disposition: A | Discharge status: Home / Self Care | Payer: Medicare Other | Source: Ambulatory Visit | Attending: Radiation Oncology | Admitting: Radiation Oncology

## 2019-07-08 ENCOUNTER — Inpatient Hospital Stay: Payer: Medicare Other

## 2019-07-08 ENCOUNTER — Other Ambulatory Visit: Payer: Self-pay

## 2019-07-08 ENCOUNTER — Encounter: Payer: Self-pay | Admitting: Hematology & Oncology

## 2019-07-08 ENCOUNTER — Encounter: Payer: Self-pay | Admitting: *Deleted

## 2019-07-08 ENCOUNTER — Inpatient Hospital Stay: Payer: Medicare Other | Attending: Hematology & Oncology

## 2019-07-08 ENCOUNTER — Inpatient Hospital Stay (HOSPITAL_BASED_OUTPATIENT_CLINIC_OR_DEPARTMENT_OTHER): Payer: Medicare Other | Admitting: Hematology & Oncology

## 2019-07-08 VITALS — BP 95/68 | HR 93 | Temp 97.5°F | Resp 16 | Wt 203.0 lb

## 2019-07-08 DIAGNOSIS — C220 Liver cell carcinoma: Secondary | ICD-10-CM | POA: Insufficient documentation

## 2019-07-08 DIAGNOSIS — I2510 Atherosclerotic heart disease of native coronary artery without angina pectoris: Secondary | ICD-10-CM

## 2019-07-08 DIAGNOSIS — C7801 Secondary malignant neoplasm of right lung: Secondary | ICD-10-CM

## 2019-07-08 DIAGNOSIS — C7951 Secondary malignant neoplasm of bone: Secondary | ICD-10-CM | POA: Insufficient documentation

## 2019-07-08 DIAGNOSIS — C778 Secondary and unspecified malignant neoplasm of lymph nodes of multiple regions: Secondary | ICD-10-CM

## 2019-07-08 DIAGNOSIS — Z23 Encounter for immunization: Secondary | ICD-10-CM | POA: Diagnosis not present

## 2019-07-08 DIAGNOSIS — Z7901 Long term (current) use of anticoagulants: Secondary | ICD-10-CM | POA: Insufficient documentation

## 2019-07-08 DIAGNOSIS — C7800 Secondary malignant neoplasm of unspecified lung: Secondary | ICD-10-CM | POA: Insufficient documentation

## 2019-07-08 DIAGNOSIS — R2241 Localized swelling, mass and lump, right lower limb: Secondary | ICD-10-CM | POA: Insufficient documentation

## 2019-07-08 DIAGNOSIS — C499 Malignant neoplasm of connective and soft tissue, unspecified: Secondary | ICD-10-CM

## 2019-07-08 DIAGNOSIS — Z510 Encounter for antineoplastic radiation therapy: Secondary | ICD-10-CM | POA: Diagnosis not present

## 2019-07-08 DIAGNOSIS — Z79899 Other long term (current) drug therapy: Secondary | ICD-10-CM | POA: Insufficient documentation

## 2019-07-08 DIAGNOSIS — C787 Secondary malignant neoplasm of liver and intrahepatic bile duct: Secondary | ICD-10-CM

## 2019-07-08 HISTORY — DX: Secondary and unspecified malignant neoplasm of lymph nodes of multiple regions: C77.8

## 2019-07-08 HISTORY — DX: Secondary malignant neoplasm of unspecified lung: C22.0

## 2019-07-08 HISTORY — DX: Secondary malignant neoplasm of unspecified lung: C78.00

## 2019-07-08 LAB — CBC WITH DIFFERENTIAL (CANCER CENTER ONLY)
Abs Immature Granulocytes: 0.02 10*3/uL (ref 0.00–0.07)
Basophils Absolute: 0.1 10*3/uL (ref 0.0–0.1)
Basophils Relative: 1 %
Eosinophils Absolute: 0 10*3/uL (ref 0.0–0.5)
Eosinophils Relative: 1 %
HCT: 37.8 % — ABNORMAL LOW (ref 39.0–52.0)
Hemoglobin: 12.2 g/dL — ABNORMAL LOW (ref 13.0–17.0)
Immature Granulocytes: 0 %
Lymphocytes Relative: 25 %
Lymphs Abs: 1.2 10*3/uL (ref 0.7–4.0)
MCH: 27.4 pg (ref 26.0–34.0)
MCHC: 32.3 g/dL (ref 30.0–36.0)
MCV: 84.8 fL (ref 80.0–100.0)
Monocytes Absolute: 0.7 10*3/uL (ref 0.1–1.0)
Monocytes Relative: 15 %
Neutro Abs: 2.8 10*3/uL (ref 1.7–7.7)
Neutrophils Relative %: 58 %
Platelet Count: 211 10*3/uL (ref 150–400)
RBC: 4.46 MIL/uL (ref 4.22–5.81)
RDW: 20.2 % — ABNORMAL HIGH (ref 11.5–15.5)
WBC Count: 4.9 10*3/uL (ref 4.0–10.5)
nRBC: 0 % (ref 0.0–0.2)

## 2019-07-08 LAB — CMP (CANCER CENTER ONLY)
ALT: 26 U/L (ref 0–44)
AST: 65 U/L — ABNORMAL HIGH (ref 15–41)
Albumin: 3.5 g/dL (ref 3.5–5.0)
Alkaline Phosphatase: 140 U/L — ABNORMAL HIGH (ref 38–126)
Anion gap: 7 (ref 5–15)
BUN: 7 mg/dL — ABNORMAL LOW (ref 8–23)
CO2: 27 mmol/L (ref 22–32)
Calcium: 9.1 mg/dL (ref 8.9–10.3)
Chloride: 103 mmol/L (ref 98–111)
Creatinine: 0.67 mg/dL (ref 0.61–1.24)
GFR, Est AFR Am: >60 mL/min (ref >60)
GFR, Estimated: >60 mL/min (ref >60)
Glucose, Bld: 168 mg/dL — ABNORMAL HIGH (ref 70–99)
Potassium: 4.3 mmol/L (ref 3.5–5.1)
Sodium: 137 mmol/L (ref 135–145)
Total Bilirubin: 1.7 mg/dL — ABNORMAL HIGH (ref 0.3–1.2)
Total Protein: 6.1 g/dL — ABNORMAL LOW (ref 6.5–8.1)

## 2019-07-08 LAB — PROTIME-INR
INR: 1.5 — ABNORMAL HIGH (ref 0.8–1.2)
Prothrombin Time: 17.7 seconds — ABNORMAL HIGH (ref 11.4–15.2)

## 2019-07-08 MED ORDER — PNEUMOCOCCAL 13-VAL CONJ VACC IM SUSP
0.5000 mL | Freq: Once | INTRAMUSCULAR | Status: AC
  Administered 2019-07-08: 0.5 mL via INTRAMUSCULAR
  Filled 2019-07-08: qty 0.5

## 2019-07-08 MED ORDER — DENOSUMAB 120 MG/1.7ML ~~LOC~~ SOLN
120.0000 mg | Freq: Once | SUBCUTANEOUS | Status: AC
  Administered 2019-07-08: 120 mg via SUBCUTANEOUS

## 2019-07-08 MED ORDER — DENOSUMAB 120 MG/1.7ML ~~LOC~~ SOLN
SUBCUTANEOUS | Status: AC
  Filled 2019-07-08: qty 1.7

## 2019-07-08 NOTE — Patient Instructions (Signed)
Denosumab injection What is this medicine? DENOSUMAB (den oh sue mab) slows bone breakdown. Prolia is used to treat osteoporosis in women after menopause and in men, and in people who are taking corticosteroids for 6 months or more. Xgeva is used to treat a high calcium level due to cancer and to prevent bone fractures and other bone problems caused by multiple myeloma or cancer bone metastases. Xgeva is also used to treat giant cell tumor of the bone. This medicine may be used for other purposes; ask your health care provider or pharmacist if you have questions. COMMON BRAND NAME(S): Prolia, XGEVA What should I tell my health care provider before I take this medicine? They need to know if you have any of these conditions:  dental disease  having surgery or tooth extraction  infection  kidney disease  low levels of calcium or Vitamin D in the blood  malnutrition  on hemodialysis  skin conditions or sensitivity  thyroid or parathyroid disease  an unusual reaction to denosumab, other medicines, foods, dyes, or preservatives  pregnant or trying to get pregnant  breast-feeding How should I use this medicine? This medicine is for injection under the skin. It is given by a health care professional in a hospital or clinic setting. A special MedGuide will be given to you before each treatment. Be sure to read this information carefully each time. For Prolia, talk to your pediatrician regarding the use of this medicine in children. Special care may be needed. For Xgeva, talk to your pediatrician regarding the use of this medicine in children. While this drug may be prescribed for children as young as 13 years for selected conditions, precautions do apply. Overdosage: If you think you have taken too much of this medicine contact a poison control center or emergency room at once. NOTE: This medicine is only for you. Do not share this medicine with others. What if I miss a dose? It is  important not to miss your dose. Call your doctor or health care professional if you are unable to keep an appointment. What may interact with this medicine? Do not take this medicine with any of the following medications:  other medicines containing denosumab This medicine may also interact with the following medications:  medicines that lower your chance of fighting infection  steroid medicines like prednisone or cortisone This list may not describe all possible interactions. Give your health care provider a list of all the medicines, herbs, non-prescription drugs, or dietary supplements you use. Also tell them if you smoke, drink alcohol, or use illegal drugs. Some items may interact with your medicine. What should I watch for while using this medicine? Visit your doctor or health care professional for regular checks on your progress. Your doctor or health care professional may order blood tests and other tests to see how you are doing. Call your doctor or health care professional for advice if you get a fever, chills or sore throat, or other symptoms of a cold or flu. Do not treat yourself. This drug may decrease your body's ability to fight infection. Try to avoid being around people who are sick. You should make sure you get enough calcium and vitamin D while you are taking this medicine, unless your doctor tells you not to. Discuss the foods you eat and the vitamins you take with your health care professional. See your dentist regularly. Brush and floss your teeth as directed. Before you have any dental work done, tell your dentist you are   receiving this medicine. Do not become pregnant while taking this medicine or for 5 months after stopping it. Talk with your doctor or health care professional about your birth control options while taking this medicine. Women should inform their doctor if they wish to become pregnant or think they might be pregnant. There is a potential for serious side  effects to an unborn child. Talk to your health care professional or pharmacist for more information. What side effects may I notice from receiving this medicine? Side effects that you should report to your doctor or health care professional as soon as possible:  allergic reactions like skin rash, itching or hives, swelling of the face, lips, or tongue  bone pain  breathing problems  dizziness  jaw pain, especially after dental work  redness, blistering, peeling of the skin  signs and symptoms of infection like fever or chills; cough; sore throat; pain or trouble passing urine  signs of low calcium like fast heartbeat, muscle cramps or muscle pain; pain, tingling, numbness in the hands or feet; seizures  unusual bleeding or bruising  unusually weak or tired Side effects that usually do not require medical attention (report to your doctor or health care professional if they continue or are bothersome):  constipation  diarrhea  headache  joint pain  loss of appetite  muscle pain  runny nose  tiredness  upset stomach This list may not describe all possible side effects. Call your doctor for medical advice about side effects. You may report side effects to FDA at 1-800-FDA-1088. Where should I keep my medicine? This medicine is only given in a clinic, doctor's office, or other health care setting and will not be stored at home. NOTE: This sheet is a summary. It may not cover all possible information. If you have questions about this medicine, talk to your doctor, pharmacist, or health care provider.  2020 Elsevier/Gold Standard (2017-06-19 16:10:44)

## 2019-07-09 LAB — AFP TUMOR MARKER: AFP, Serum, Tumor Marker: 342 ng/mL — ABNORMAL HIGH (ref 0.0–8.3)

## 2019-07-11 ENCOUNTER — Other Ambulatory Visit: Payer: Self-pay

## 2019-07-11 ENCOUNTER — Telehealth: Payer: Self-pay | Admitting: Hematology & Oncology

## 2019-07-11 ENCOUNTER — Ambulatory Visit
Admission: RE | Admit: 2019-07-11 | Discharge: 2019-07-11 | Disposition: A | Discharge status: Home / Self Care | Payer: Medicare Other | Source: Ambulatory Visit | Attending: Radiation Oncology | Admitting: Radiation Oncology

## 2019-07-11 DIAGNOSIS — Z510 Encounter for antineoplastic radiation therapy: Secondary | ICD-10-CM | POA: Diagnosis not present

## 2019-07-11 NOTE — Progress Notes (Signed)
START OFF PATHWAY REGIMEN - Other   [id number]:Atezolizumab 1,200 mg IV D1 + Bevacizumab 15 mg/kg IV D1 q21 Days:   A cycle is every 21 Days:     Atezolizumab      Bevacizumab-xxxx   **Always confirm dose/schedule in your pharmacy ordering system**  Patient Characteristics: Intent of Therapy: Non-Curative / Palliative Intent, Discussed with Patient 

## 2019-07-11 NOTE — Progress Notes (Signed)
Hematology and Oncology Follow Up Visit  Kerry Adams [id number] [birth year] 64 y.o. 07/11/2019   Principle Diagnosis:   Hepatocellular carcinoma -- mets to bone, lung, lymph nodes  Current Therapy:    Palliative Radiation to the RIGHT femur  Tecentriq/Avastin -- start cycle #1 on 07/2019  Xgeva 120 mg sq q 3 months -- next dose in 09/2019     Interim History:  Kerry Adams is back for follow-up.  Shockingly, he does not have a sarcoma.  He actually has metastatic hepatocellular carcinoma.  We got a biopsy of the soft tissue mass in the right hip.  This was done on 07/04/2019.  The pathology report ([id number]) showed hepatocellular carcinoma.  Again, I am absolutely stone by this.  I cannot remember the last time that I saw hepatocellular carcinoma behave in this manner.  We did get an alpha-fetoprotein on the tumor.  The alpha-fetoprotein was elevated at 350.  We are awaiting molecular studies.  He is now about have radiation therapy to the right pelvis.  Hopefully this will help with some of the pain.  He did get Xgeva today.  He does seem to be doing pretty well.  He is eating okay.  He is not having problems with cough or shortness of breath.  There is no nausea or vomiting.  He is going to the bathroom okay.  He has no headache.  Again, very surprised at the results.  Thankfully, we can at least try to treat this and give him some quality of life.  Currently, I would say his performance status is ECOG 1.  Medications:  Current Outpatient Medications:  .  acetaminophen (TYLENOL) 500 MG tablet, Take 500 mg by mouth every 8 (eight) hours as needed (pain)., Disp: , Rfl:  .  amLODipine (NORVASC) 5 MG tablet, Take 1 tablet (5 mg total) by mouth daily., Disp: 90 tablet, Rfl: 3 .  amoxicillin (AMOXIL) 500 MG capsule, Take 2,000 mg by mouth as directed. Before dental work, Disp: , Rfl:  .  famotidine (PEPCID) 10 MG tablet, Take 10 mg by mouth every morning. , Disp: , Rfl:  .   loperamide (IMODIUM A-D) 2 MG tablet, Take 1 mg by mouth at bedtime as needed for diarrhea or loose stools. Takes 1/2 tablet total of 1 mg at bedtime., Disp: , Rfl:  .  metoprolol tartrate (LOPRESSOR) 50 MG tablet, Take 1 tablet (50 mg total) by mouth 2 (two) times daily., Disp: 180 tablet, Rfl: 1 .  Potassium 99 MG TABS, Take 99 mg by mouth daily., Disp: , Rfl:  .  Pseudoeph-Doxylamine-DM-APAP (NYQUIL MULTI-SYMPTOM PO), Take 1 capsule by mouth at bedtime., Disp: , Rfl:  .  rivaroxaban (XARELTO) 20 MG TABS tablet, Take 1 tablet (20 mg total) by mouth daily with supper., Disp: 90 tablet, Rfl: 3 .  traMADol (ULTRAM) 50 MG tablet, Take 1 tablet (50 mg total) by mouth every 6 (six) hours as needed. (Patient not taking: Reported on 06/30/2019), Disp: 90 tablet, Rfl: 0 .  trolamine salicylate (ASPERCREME) 10 % cream, Apply 1 application topically as needed for muscle pain., Disp: , Rfl:   Allergies: No Known Allergies  Past Medical History, Surgical history, Social history, and Family History were reviewed and updated.  Review of Systems: Review of Systems  Constitutional: Negative.   HENT:  Negative.   Eyes: Negative.   Respiratory: Negative.   Cardiovascular: Negative.   Gastrointestinal: Negative.   Endocrine: Negative.   Musculoskeletal: Positive for arthralgias and flank pain.  Skin: Negative.   Neurological: Negative.   Hematological: Negative.   Psychiatric/Behavioral: Negative.     Physical Exam:  weight is 203 lb (92.1 kg). His temporal temperature is 97.5 F (36.4 C) (abnormal). His blood pressure is 95/68 and his pulse is 93. His respiration is 16 and oxygen saturation is 100%.   Wt Readings from Last 3 Encounters:  07/08/19 203 lb (92.1 kg)  06/30/19 206 lb 12.8 oz (93.8 kg)  06/20/19 203 lb (92.1 kg)    Physical Exam Vitals reviewed.  HENT:     Head: Normocephalic and atraumatic.  Eyes:     Pupils: Pupils are equal, round, and reactive to light.  Cardiovascular:      Rate and Rhythm: Normal rate and regular rhythm.     Heart sounds: Normal heart sounds.  Pulmonary:     Effort: Pulmonary effort is normal.     Breath sounds: Normal breath sounds.  Abdominal:     General: Bowel sounds are normal.     Palpations: Abdomen is soft.  Musculoskeletal:        General: No tenderness or deformity. Normal range of motion.     Cervical back: Normal range of motion.  Lymphadenopathy:     Cervical: No cervical adenopathy.  Skin:    General: Skin is warm and dry.     Findings: No erythema or rash.  Neurological:     Mental Status: He is alert and oriented to person, place, and time.  Psychiatric:        Behavior: Behavior normal.        Thought Content: Thought content normal.        Judgment: Judgment normal.      Lab Results  Component Value Date   WBC 4.9 07/08/2019   HGB 12.2 (L) 07/08/2019   HCT 37.8 (L) 07/08/2019   MCV 84.8 07/08/2019   PLT 211 07/08/2019     Chemistry      Component Value Date/Time   NA 137 07/08/2019 1202   NA 137 05/13/2019 1110   K 4.3 07/08/2019 1202   CL 103 07/08/2019 1202   CO2 27 07/08/2019 1202   BUN 7 (L) 07/08/2019 1202   BUN 7 (L) 05/13/2019 1110   CREATININE 0.67 07/08/2019 1202      Component Value Date/Time   CALCIUM 9.1 07/08/2019 1202   ALKPHOS 140 (H) 07/08/2019 1202   AST 65 (H) 07/08/2019 1202   ALT 26 07/08/2019 1202   BILITOT 1.7 (H) 07/08/2019 1202      Impression and Plan: Kerry Adams is a very nice 64 year old white male.  He has metastatic hepatocellular carcinoma.  Again this is a most unusual presentation for hepatocellular carcinoma.  We clearly can help this.  I think that the combination of immunotherapy and Avastin would be reasonable.  I think it would be effective and would not interfere with his quality of life.  I believe that the use of Avastin/Tecentriq would be in line with standard of care.  As such, we talked about Avastin/Tecentriq.  He is agreeable to this.  We do  not need any Port-A-Cath because he has good peripheral access.  I gave him information sheets about the Avastin and the Tecentriq.  I went over the side effects.  I explained the possibility of high blood pressure.  I told him that we would be checking his thyroid and liver tests.  I went over the possibility of diarrhea.  I made sure that he knows to  call us if he has any problems with diarrhea.  I spent about 45 minutes with he and his wife.  I am glad that we are able to help him out.  I think this will definitely benefit him and give him some better quality of life and some quantity of life.  He does not want to start treatment till after radiation is finished.  As such, we probably will start in early June.  I probably would give him 4 cycles of treatment and then reassess his scans.  I think the alpha-fetoprotein will also tell us how he is doing.   Volanda Napoleon, MD 5/17/20211:25 PM

## 2019-07-11 NOTE — Telephone Encounter (Signed)
No los 5/14

## 2019-07-12 ENCOUNTER — Other Ambulatory Visit: Payer: Self-pay

## 2019-07-12 ENCOUNTER — Ambulatory Visit
Admission: RE | Admit: 2019-07-12 | Discharge: 2019-07-12 | Disposition: A | Discharge status: Home / Self Care | Payer: Medicare Other | Source: Ambulatory Visit | Attending: Radiation Oncology | Admitting: Radiation Oncology

## 2019-07-12 ENCOUNTER — Telehealth: Payer: Self-pay | Admitting: Hematology & Oncology

## 2019-07-12 DIAGNOSIS — Z510 Encounter for antineoplastic radiation therapy: Secondary | ICD-10-CM | POA: Diagnosis not present

## 2019-07-12 NOTE — Telephone Encounter (Signed)
I called and spoke with patient and his wife regarding appointments added she was ok with all dates/times per 5/14 late los

## 2019-07-13 ENCOUNTER — Ambulatory Visit
Admission: RE | Admit: 2019-07-13 | Discharge: 2019-07-13 | Disposition: A | Discharge status: Home / Self Care | Payer: Medicare Other | Source: Ambulatory Visit | Attending: Radiation Oncology | Admitting: Radiation Oncology

## 2019-07-13 ENCOUNTER — Other Ambulatory Visit: Payer: Self-pay

## 2019-07-13 DIAGNOSIS — Z510 Encounter for antineoplastic radiation therapy: Secondary | ICD-10-CM | POA: Diagnosis not present

## 2019-07-14 ENCOUNTER — Ambulatory Visit
Admission: RE | Admit: 2019-07-14 | Discharge: 2019-07-14 | Disposition: A | Discharge status: Home / Self Care | Payer: Medicare Other | Source: Ambulatory Visit | Attending: Radiation Oncology | Admitting: Radiation Oncology

## 2019-07-14 ENCOUNTER — Other Ambulatory Visit: Payer: Self-pay

## 2019-07-14 DIAGNOSIS — Z510 Encounter for antineoplastic radiation therapy: Secondary | ICD-10-CM | POA: Diagnosis not present

## 2019-07-15 ENCOUNTER — Ambulatory Visit
Admission: RE | Admit: 2019-07-15 | Discharge: 2019-07-15 | Disposition: A | Discharge status: Home / Self Care | Payer: Medicare Other | Source: Ambulatory Visit | Attending: Radiation Oncology | Admitting: Radiation Oncology

## 2019-07-15 ENCOUNTER — Other Ambulatory Visit: Payer: Self-pay

## 2019-07-15 DIAGNOSIS — Z510 Encounter for antineoplastic radiation therapy: Secondary | ICD-10-CM | POA: Diagnosis not present

## 2019-07-16 ENCOUNTER — Other Ambulatory Visit: Payer: Self-pay | Admitting: Internal Medicine

## 2019-07-18 ENCOUNTER — Other Ambulatory Visit: Payer: Self-pay

## 2019-07-18 ENCOUNTER — Other Ambulatory Visit: Payer: Self-pay | Admitting: Internal Medicine

## 2019-07-18 ENCOUNTER — Ambulatory Visit
Admission: RE | Admit: 2019-07-18 | Discharge: 2019-07-18 | Disposition: A | Discharge status: Home / Self Care | Payer: Medicare Other | Source: Ambulatory Visit | Attending: Radiation Oncology | Admitting: Radiation Oncology

## 2019-07-18 DIAGNOSIS — Z510 Encounter for antineoplastic radiation therapy: Secondary | ICD-10-CM | POA: Diagnosis not present

## 2019-07-18 MED ORDER — RIVAROXABAN 20 MG PO TABS: 20.0000 mg | ORAL_TABLET | Freq: Every day | ORAL | 5 refills | Status: DC

## 2019-07-18 MED ORDER — AMLODIPINE BESYLATE 5 MG PO TABS: 5.0000 mg | ORAL_TABLET | Freq: Every day | ORAL | 2 refills | Status: DC

## 2019-07-18 NOTE — Telephone Encounter (Signed)
*  STAT* If patient is at the pharmacy, call can be transferred to refill team.   1. Which medications need to be refilled? (please list name of each medication and dose if known) Amlodipine and Xarelto   2. Which pharmacy/location (including street and city if local pharmacy) is medication to be sent to? [facility] [place]  3. Do they need a 30 day or 90 day supply?  30  Days and regills

## 2019-07-18 NOTE — Telephone Encounter (Signed)
Prescription refill request for Xarelto received.   Last office visit: [place], 05/31/2019 Weight: 92.1 kg Age: 64 y.o. Scr: 0.67, 07/08/2019 CrCl:145 ml/min   Prescription refill sent.

## 2019-07-18 NOTE — Telephone Encounter (Signed)
Pt's medication was sent to pt's pharmacy as requested. Confirmation received.  °

## 2019-07-19 ENCOUNTER — Ambulatory Visit
Admission: RE | Admit: 2019-07-19 | Discharge: 2019-07-19 | Disposition: A | Discharge status: Home / Self Care | Payer: Medicare Other | Source: Ambulatory Visit | Attending: Radiation Oncology | Admitting: Radiation Oncology

## 2019-07-19 ENCOUNTER — Other Ambulatory Visit: Payer: Self-pay

## 2019-07-19 DIAGNOSIS — Z510 Encounter for antineoplastic radiation therapy: Secondary | ICD-10-CM | POA: Diagnosis not present

## 2019-07-20 ENCOUNTER — Encounter: Payer: Self-pay | Admitting: Radiation Oncology

## 2019-07-20 ENCOUNTER — Other Ambulatory Visit: Payer: Self-pay

## 2019-07-20 ENCOUNTER — Ambulatory Visit
Admission: RE | Admit: 2019-07-20 | Discharge: 2019-07-20 | Disposition: A | Discharge status: Home / Self Care | Payer: Medicare Other | Source: Ambulatory Visit | Attending: Radiation Oncology | Admitting: Radiation Oncology

## 2019-07-20 DIAGNOSIS — Z510 Encounter for antineoplastic radiation therapy: Secondary | ICD-10-CM | POA: Diagnosis not present

## 2019-07-21 ENCOUNTER — Ambulatory Visit: Payer: Medicare Other

## 2019-07-22 ENCOUNTER — Ambulatory Visit: Payer: Medicare Other

## 2019-07-26 ENCOUNTER — Other Ambulatory Visit: Payer: Self-pay | Admitting: *Deleted

## 2019-07-26 ENCOUNTER — Ambulatory Visit: Payer: Medicare Other

## 2019-07-26 DIAGNOSIS — C7800 Secondary malignant neoplasm of unspecified lung: Secondary | ICD-10-CM

## 2019-07-26 DIAGNOSIS — C220 Liver cell carcinoma: Secondary | ICD-10-CM

## 2019-07-26 MED ORDER — ONDANSETRON HCL 8 MG PO TABS: 8.0000 mg | ORAL_TABLET | Freq: Two times a day (BID) | ORAL | 1 refills | Status: DC | PRN

## 2019-07-26 MED ORDER — PROCHLORPERAZINE MALEATE 10 MG PO TABS: 10.0000 mg | ORAL_TABLET | Freq: Four times a day (QID) | ORAL | 1 refills | Status: DC | PRN

## 2019-07-27 ENCOUNTER — Ambulatory Visit: Payer: Medicare Other

## 2019-07-28 ENCOUNTER — Ambulatory Visit: Payer: Medicare Other

## 2019-07-29 ENCOUNTER — Encounter: Payer: Self-pay | Admitting: *Deleted

## 2019-07-29 NOTE — Progress Notes (Signed)
Oncology Nurse Navigator Documentation  Oncology Nurse Navigator Flowsheets 07/29/2019  Abnormal Finding Date -  Confirmed Diagnosis Date -  Diagnosis Status -  Planned Course of Treatment -  Phase of Treatment -  Chemo/Radiation Concurrent Pending- Reason: -  Radiation Actual Start Date: -  Navigator Follow Up Date: -  Navigator Follow Up Reason: -  Financial planner  Referral Date to RadOnc/MedOnc -  Navigator Encounter Type Telephone  Telephone Incoming Call;Appt Confirmation/Clarification  Patient Visit Type -  Treatment Phase Pre-Tx/Tx Discussion  Visual merchandiser for Upcoming Surgery/ Treatment  Interventions Education;Psycho-Social Support  Acuity Level 2-Minimal Needs (1-2 Barriers Identified)  Referrals -  Coordination of Care -  Education Method Verbal  Support Groups/Services Friends and Family  Time Spent with Patient 30

## 2019-07-29 NOTE — Progress Notes (Signed)
Pharmacist Chemotherapy Monitoring - Initial Assessment    Anticipated start date: 08/05/19  Regimen:  . Are orders appropriate based on the patient's diagnosis, regimen, and cycle? Yes . Does the plan date match the patient's scheduled date? Yes . Is the sequencing of drugs appropriate? Yes . Are the premedications appropriate for the patient's regimen? Yes . Prior Authorization for treatment is: Approved o If applicable, is the correct biosimilar selected based on the patient's insurance? yes  Organ Function and Labs: Marland Kitchen Are dose adjustments needed based on the patient's renal function, hepatic function, or hematologic function? Yes . Are appropriate labs ordered prior to the start of patient's treatment? Yes . Other organ system assessment, if indicated: N/A . The following baseline labs, if indicated, have been ordered: atezolizumab: baseline TSH +/- T4  Dose Assessment: . Are the drug doses appropriate? Yes . Are the following correct: o Drug concentrations Yes o IV fluid compatible with drug Yes o Administration routes Yes o Timing of therapy Yes . If applicable, does the patient have documented access for treatment and/or plans for port-a-cath placement? no . If applicable, have lifetime cumulative doses been properly documented and assessed? not applicable  Toxicity Monitoring/Prevention: . The patient has the following take home antiemetics prescribed: Prochlorperazine . The patient has the following take home medications prescribed: N/A . Medication allergies and previous infusion related reactions, if applicable, have been reviewed and addressed. Yes . The patient's current medication list has been assessed for drug-drug interactions with their chemotherapy regimen. no significant drug-drug interactions were identified on review.  Order Review: . Are the treatment plan orders signed? Yes . Is the patient scheduled to see a provider prior to their treatment? Yes  I  verify that I have reviewed each item in the above checklist and answered each question accordingly.   Kennith Center, Pharm.D., CPP 07/29/2019@3 :28 PM

## 2019-08-02 ENCOUNTER — Inpatient Hospital Stay: Payer: Medicare Other | Admitting: Nutrition

## 2019-08-02 ENCOUNTER — Other Ambulatory Visit: Payer: Self-pay

## 2019-08-02 ENCOUNTER — Inpatient Hospital Stay: Payer: Medicare Other | Attending: Hematology & Oncology

## 2019-08-02 DIAGNOSIS — R197 Diarrhea, unspecified: Secondary | ICD-10-CM | POA: Insufficient documentation

## 2019-08-02 DIAGNOSIS — R109 Unspecified abdominal pain: Secondary | ICD-10-CM | POA: Insufficient documentation

## 2019-08-02 DIAGNOSIS — Z5112 Encounter for antineoplastic immunotherapy: Secondary | ICD-10-CM | POA: Insufficient documentation

## 2019-08-02 DIAGNOSIS — C220 Liver cell carcinoma: Secondary | ICD-10-CM | POA: Insufficient documentation

## 2019-08-02 DIAGNOSIS — Z7901 Long term (current) use of anticoagulants: Secondary | ICD-10-CM | POA: Insufficient documentation

## 2019-08-02 DIAGNOSIS — C7951 Secondary malignant neoplasm of bone: Secondary | ICD-10-CM | POA: Insufficient documentation

## 2019-08-02 DIAGNOSIS — C7800 Secondary malignant neoplasm of unspecified lung: Secondary | ICD-10-CM | POA: Insufficient documentation

## 2019-08-02 DIAGNOSIS — R21 Rash and other nonspecific skin eruption: Secondary | ICD-10-CM | POA: Insufficient documentation

## 2019-08-02 DIAGNOSIS — M25552 Pain in left hip: Secondary | ICD-10-CM | POA: Insufficient documentation

## 2019-08-02 DIAGNOSIS — Z7982 Long term (current) use of aspirin: Secondary | ICD-10-CM | POA: Insufficient documentation

## 2019-08-02 DIAGNOSIS — C779 Secondary and unspecified malignant neoplasm of lymph node, unspecified: Secondary | ICD-10-CM | POA: Insufficient documentation

## 2019-08-02 DIAGNOSIS — Z79899 Other long term (current) drug therapy: Secondary | ICD-10-CM | POA: Insufficient documentation

## 2019-08-02 NOTE — Progress Notes (Signed)
64 year old male diagnosed with Hepatocellular cancer with mets to lung. He is a patient of Dr. Marin Olp receiving immunotherapy.  PMH includes HTN, HLD, GERD, Cirrhosis, Atrial Fib, and ETOH use.  Medications include Pepcid, Immodium, Zofran, Potassium, and Compazine.  Labs include Glucose 168 and K 4.3 on May 14.  Height: 6'1". Weight: 203 pounds on May 14. UBW: 212 pounds April 2021. BMI: 26.78.  Patient reports he is eating well without restrictions. He has 2-3 loose stools daily and takes 2 Immodium daily. Reports weight of 205 pounds on his home scale this morning. Denies wt changes.  Nutrition Diagnosis: Food and Nutrition Related knowledge deficit related to cancer as evidenced by no prior need for nutrition education.  Intervention: Educated on importance of increased calories and protein in small, frequent meals and snacks. Discouraged weight loss. Reviewed strategies for eating with nausea and/or diarrhea. Fact sheets provided. Questions answered and teach back method used. Contact information given.  Monitoring, Evaluation, Goals: Patient will tolerate adequate calories and protein to minimize weight loss during treatment.  Next Visit; To be scheduled as needed. Patient will call me with questions.

## 2019-08-02 NOTE — Progress Notes (Signed)
Patient came in today for patient teaching on Avastin and Tecentriq. Reviewed immunotherapy, side effects, antinausea medications (Zofran, Compazine), length of treatments, labs, reviewed the You and Chemotherapy Book, tips to help you get through your treatments and schedules. All other questions were answered to their satisfaction. Booklet of information was given to them at the end. Amelia Jo, RN

## 2019-08-03 ENCOUNTER — Ambulatory Visit: Payer: BC Managed Care – PPO | Admitting: Hematology & Oncology

## 2019-08-03 ENCOUNTER — Other Ambulatory Visit: Payer: BC Managed Care – PPO

## 2019-08-05 ENCOUNTER — Inpatient Hospital Stay (HOSPITAL_BASED_OUTPATIENT_CLINIC_OR_DEPARTMENT_OTHER): Payer: Medicare Other | Admitting: Hematology & Oncology

## 2019-08-05 ENCOUNTER — Inpatient Hospital Stay: Payer: Medicare Other

## 2019-08-05 ENCOUNTER — Encounter: Payer: Self-pay | Admitting: Hematology & Oncology

## 2019-08-05 ENCOUNTER — Other Ambulatory Visit: Payer: Medicare Other

## 2019-08-05 ENCOUNTER — Other Ambulatory Visit: Payer: Self-pay

## 2019-08-05 VITALS — BP 111/88 | HR 108 | Temp 97.1°F | Resp 18 | Ht 73.0 in | Wt 217.1 lb

## 2019-08-05 VITALS — HR 60

## 2019-08-05 DIAGNOSIS — C779 Secondary and unspecified malignant neoplasm of lymph node, unspecified: Secondary | ICD-10-CM | POA: Diagnosis not present

## 2019-08-05 DIAGNOSIS — Z5112 Encounter for antineoplastic immunotherapy: Secondary | ICD-10-CM | POA: Diagnosis not present

## 2019-08-05 DIAGNOSIS — R197 Diarrhea, unspecified: Secondary | ICD-10-CM | POA: Diagnosis not present

## 2019-08-05 DIAGNOSIS — C220 Liver cell carcinoma: Secondary | ICD-10-CM | POA: Diagnosis not present

## 2019-08-05 DIAGNOSIS — C7800 Secondary malignant neoplasm of unspecified lung: Secondary | ICD-10-CM

## 2019-08-05 DIAGNOSIS — Z79899 Other long term (current) drug therapy: Secondary | ICD-10-CM | POA: Diagnosis not present

## 2019-08-05 DIAGNOSIS — C7951 Secondary malignant neoplasm of bone: Secondary | ICD-10-CM

## 2019-08-05 DIAGNOSIS — I2510 Atherosclerotic heart disease of native coronary artery without angina pectoris: Secondary | ICD-10-CM | POA: Diagnosis not present

## 2019-08-05 DIAGNOSIS — C778 Secondary and unspecified malignant neoplasm of lymph nodes of multiple regions: Secondary | ICD-10-CM

## 2019-08-05 DIAGNOSIS — R109 Unspecified abdominal pain: Secondary | ICD-10-CM | POA: Diagnosis not present

## 2019-08-05 DIAGNOSIS — Z7901 Long term (current) use of anticoagulants: Secondary | ICD-10-CM | POA: Diagnosis not present

## 2019-08-05 DIAGNOSIS — M25552 Pain in left hip: Secondary | ICD-10-CM | POA: Diagnosis not present

## 2019-08-05 DIAGNOSIS — Z7982 Long term (current) use of aspirin: Secondary | ICD-10-CM | POA: Diagnosis not present

## 2019-08-05 DIAGNOSIS — R21 Rash and other nonspecific skin eruption: Secondary | ICD-10-CM | POA: Diagnosis not present

## 2019-08-05 LAB — LACTATE DEHYDROGENASE: LDH: 210 U/L — ABNORMAL HIGH (ref 98–192)

## 2019-08-05 LAB — CMP (CANCER CENTER ONLY)
ALT: 24 U/L (ref 0–44)
AST: 59 U/L — ABNORMAL HIGH (ref 15–41)
Albumin: 3.1 g/dL — ABNORMAL LOW (ref 3.5–5.0)
Alkaline Phosphatase: 99 U/L (ref 38–126)
Anion gap: 8 (ref 5–15)
BUN: 10 mg/dL (ref 8–23)
CO2: 24 mmol/L (ref 22–32)
Calcium: 8.5 mg/dL — ABNORMAL LOW (ref 8.9–10.3)
Chloride: 98 mmol/L (ref 98–111)
Creatinine: 0.68 mg/dL (ref 0.61–1.24)
GFR, Est AFR Am: >60 mL/min (ref >60)
GFR, Estimated: >60 mL/min (ref >60)
Glucose, Bld: 107 mg/dL — ABNORMAL HIGH (ref 70–99)
Potassium: 5.1 mmol/L (ref 3.5–5.1)
Sodium: 130 mmol/L — ABNORMAL LOW (ref 135–145)
Total Bilirubin: 2.4 mg/dL — ABNORMAL HIGH (ref 0.3–1.2)
Total Protein: 5.9 g/dL — ABNORMAL LOW (ref 6.5–8.1)

## 2019-08-05 LAB — CBC WITH DIFFERENTIAL (CANCER CENTER ONLY)
Abs Immature Granulocytes: 0.03 10*3/uL (ref 0.00–0.07)
Basophils Absolute: 0.1 10*3/uL (ref 0.0–0.1)
Basophils Relative: 1 %
Eosinophils Absolute: 0 10*3/uL (ref 0.0–0.5)
Eosinophils Relative: 0 %
HCT: 38.4 % — ABNORMAL LOW (ref 39.0–52.0)
Hemoglobin: 12.5 g/dL — ABNORMAL LOW (ref 13.0–17.0)
Immature Granulocytes: 1 %
Lymphocytes Relative: 15 %
Lymphs Abs: 0.8 10*3/uL (ref 0.7–4.0)
MCH: 31 pg (ref 26.0–34.0)
MCHC: 32.6 g/dL (ref 30.0–36.0)
MCV: 95.3 fL (ref 80.0–100.0)
Monocytes Absolute: 1.1 10*3/uL — ABNORMAL HIGH (ref 0.1–1.0)
Monocytes Relative: 18 %
Neutro Abs: 3.7 10*3/uL (ref 1.7–7.7)
Neutrophils Relative %: 65 %
Platelet Count: 148 10*3/uL — ABNORMAL LOW (ref 150–400)
RBC: 4.03 MIL/uL — ABNORMAL LOW (ref 4.22–5.81)
RDW: 23 % — ABNORMAL HIGH (ref 11.5–15.5)
WBC Count: 5.7 10*3/uL (ref 4.0–10.5)
nRBC: 0 % (ref 0.0–0.2)

## 2019-08-05 LAB — TSH: TSH: 1.124 u[IU]/mL (ref 0.320–4.118)

## 2019-08-05 LAB — TOTAL PROTEIN, URINE DIPSTICK: Protein, ur: NEGATIVE mg/dL

## 2019-08-05 MED ORDER — SODIUM CHLORIDE 0.9 % IV SOLN
Freq: Once | INTRAVENOUS | Status: AC
  Filled 2019-08-05: qty 250

## 2019-08-05 MED ORDER — SODIUM CHLORIDE 0.9 % IV SOLN
15.0000 mg/kg | Freq: Once | INTRAVENOUS | Status: AC
  Administered 2019-08-05: 1400 mg via INTRAVENOUS
  Filled 2019-08-05: qty 48

## 2019-08-05 MED ORDER — SODIUM CHLORIDE 0.9 % IV SOLN
1200.0000 mg | Freq: Once | INTRAVENOUS | Status: AC
  Administered 2019-08-05: 1200 mg via INTRAVENOUS
  Filled 2019-08-05: qty 20

## 2019-08-05 NOTE — Progress Notes (Signed)
Okay to treat with Tbili 2.4 per Dr. Marin Olp.

## 2019-08-05 NOTE — Progress Notes (Signed)
Hematology and Oncology Follow Up Visit  Kerry Adams [id number] [birth year] 64 y.o. 08/05/2019   Principle Diagnosis:   Hepatocellular carcinoma -- mets to bone, lung, lymph nodes  Current Therapy:    Palliative Radiation to the RIGHT femur  Tecentriq/Avastin -- start cycle #1 on 08/05/2019  Xgeva 120 mg sq q 3 months -- next dose in 09/2019     Interim History:  Kerry Adams is back for follow-up.  He is looking better.  He is feeling okay.  He is not having as much pain over in the right hip.  He completed his radiation therapy about a week or so ago.  He is eating well.  He and his wife are both very tan.  They have been outside.  They went to the beach.  They had some good seafood.  He has had no problems with bowels or bladder.  He has had no nausea or vomiting.  He has had no bleeding.    Currently, I would say his performance status is ECOG 1.  Medications:  Current Outpatient Medications:  .  acetaminophen (TYLENOL) 500 MG tablet, Take 500 mg by mouth every 8 (eight) hours as needed (pain)., Disp: , Rfl:  .  amoxicillin (AMOXIL) 500 MG capsule, Take 2,000 mg by mouth as directed. Before dental work, Disp: , Rfl:  .  famotidine (PEPCID) 10 MG tablet, Take 10 mg by mouth every morning. , Disp: , Rfl:  .  loperamide (IMODIUM A-D) 2 MG tablet, Take 1 mg by mouth at bedtime as needed for diarrhea or loose stools. Takes 1/2 tablet total of 1 mg at bedtime., Disp: , Rfl:  .  metoprolol tartrate (LOPRESSOR) 50 MG tablet, Take 1 tablet (50 mg total) by mouth 2 (two) times daily., Disp: 180 tablet, Rfl: 1 .  ondansetron (ZOFRAN) 8 MG tablet, Take 1 tablet (8 mg total) by mouth 2 (two) times daily as needed (Nausea or vomiting)., Disp: 30 tablet, Rfl: 1 .  Potassium 99 MG TABS, Take 99 mg by mouth daily., Disp: , Rfl:  .  prochlorperazine (COMPAZINE) 10 MG tablet, Take 1 tablet (10 mg total) by mouth every 6 (six) hours as needed (Nausea or vomiting)., Disp: 30 tablet, Rfl: 1 .   Pseudoeph-Doxylamine-DM-APAP (NYQUIL MULTI-SYMPTOM PO), Take 1 capsule by mouth at bedtime., Disp: , Rfl:  .  rivaroxaban (XARELTO) 20 MG TABS tablet, Take 1 tablet (20 mg total) by mouth daily with supper., Disp: 30 tablet, Rfl: 5 .  traMADol (ULTRAM) 50 MG tablet, Take 1 tablet (50 mg total) by mouth every 6 (six) hours as needed., Disp: 90 tablet, Rfl: 0 .  trolamine salicylate (ASPERCREME) 10 % cream, Apply 1 application topically as needed for muscle pain., Disp: , Rfl:  .  amLODipine (NORVASC) 5 MG tablet, Take 1 tablet (5 mg total) by mouth daily. (Patient not taking: Reported on 08/05/2019), Disp: 90 tablet, Rfl: 2  Allergies: No Known Allergies  Past Medical History, Surgical history, Social history, and Family History were reviewed and updated.  Review of Systems: Review of Systems  Constitutional: Negative.   HENT:  Negative.   Eyes: Negative.   Respiratory: Negative.   Cardiovascular: Negative.   Gastrointestinal: Negative.   Endocrine: Negative.   Musculoskeletal: Positive for arthralgias and flank pain.  Skin: Negative.   Neurological: Negative.   Hematological: Negative.   Psychiatric/Behavioral: Negative.     Physical Exam:  height is 6\' 1"  (1.854 m) and weight is 217 lb 1.9 oz (98.5 kg).  His temporal temperature is 97.1 F (36.2 C) (abnormal). His blood pressure is 111/88 and his pulse is 108 (abnormal). His respiration is 18 and oxygen saturation is 100%.   Wt Readings from Last 3 Encounters:  08/05/19 217 lb 1.9 oz (98.5 kg)  07/08/19 203 lb (92.1 kg)  06/30/19 206 lb 12.8 oz (93.8 kg)    Physical Exam Vitals reviewed.  HENT:     Head: Normocephalic and atraumatic.  Eyes:     Pupils: Pupils are equal, round, and reactive to light.  Cardiovascular:     Rate and Rhythm: Normal rate and regular rhythm.     Heart sounds: Normal heart sounds.  Pulmonary:     Effort: Pulmonary effort is normal.     Breath sounds: Normal breath sounds.  Abdominal:      General: Bowel sounds are normal.     Palpations: Abdomen is soft.  Musculoskeletal:        General: No tenderness or deformity. Normal range of motion.     Cervical back: Normal range of motion.  Lymphadenopathy:     Cervical: No cervical adenopathy.  Skin:    General: Skin is warm and dry.     Findings: No erythema or rash.  Neurological:     Mental Status: He is alert and oriented to person, place, and time.  Psychiatric:        Behavior: Behavior normal.        Thought Content: Thought content normal.        Judgment: Judgment normal.      Lab Results  Component Value Date   WBC 5.7 08/05/2019   HGB 12.5 (L) 08/05/2019   HCT 38.4 (L) 08/05/2019   MCV 95.3 08/05/2019   PLT 148 (L) 08/05/2019     Chemistry      Component Value Date/Time   NA 130 (L) 08/05/2019 1003   NA 137 05/13/2019 1110   K 5.1 08/05/2019 1003   CL 98 08/05/2019 1003   CO2 24 08/05/2019 1003   BUN 10 08/05/2019 1003   BUN 7 (L) 05/13/2019 1110   CREATININE 0.68 08/05/2019 1003      Component Value Date/Time   CALCIUM 8.5 (L) 08/05/2019 1003   ALKPHOS 99 08/05/2019 1003   AST 59 (H) 08/05/2019 1003   ALT 24 08/05/2019 1003   BILITOT 2.4 (H) 08/05/2019 1003      Impression and Plan: Kerry Adams is a very nice 64 year old white male.  He has metastatic hepatocellular carcinoma.  Again this is a most unusual presentation for hepatocellular carcinoma.  He will now start immunotherapy along with Avastin.  I believe that he should have a good response.  His baseline alpha-fetoprotein is 342.  I will give him 3 cycles of treatment and then we will reassess with our scans.  I am just glad that he is doing a bit better.  He is getting around a little bit better.  The radiation therapy should be working nicely.  His alkaline phosphatase is down so this hopefully is a good indicator for [place].Volanda Napoleon, MD 6/11/202111:00 AM

## 2019-08-05 NOTE — Patient Instructions (Signed)
[facility] Discharge Instructions for Patients Receiving Chemotherapy  Today you received the following chemotherapy agents Avastin and Tecentric  To help prevent nausea and vomiting after your treatment, we encourage you to take your nausea medication as directed If you develop nausea and vomiting that is not controlled by your nausea medication, call the clinic.   BELOW ARE SYMPTOMS THAT SHOULD BE REPORTED IMMEDIATELY:  *FEVER GREATER THAN 100.5 F  *CHILLS WITH OR WITHOUT FEVER  NAUSEA AND VOMITING THAT IS NOT CONTROLLED WITH YOUR NAUSEA MEDICATION  *UNUSUAL SHORTNESS OF BREATH  *UNUSUAL BRUISING OR BLEEDING  TENDERNESS IN MOUTH AND THROAT WITH OR WITHOUT PRESENCE OF ULCERS  *URINARY PROBLEMS  *BOWEL PROBLEMS  UNUSUAL RASH Items with * indicate a potential emergency and should be followed up as soon as possible.  Feel free to call the clinic should you have any questions or concerns. The clinic phone number is [phone].  Please show the [facility] at check-in to the Emergency Department and triage nurse.

## 2019-08-06 LAB — AFP TUMOR MARKER: AFP, Serum, Tumor Marker: 406 ng/mL — ABNORMAL HIGH (ref 0.0–8.3)

## 2019-08-06 LAB — T4: T4, Total: 4.5 ug/dL (ref 4.5–12.0)

## 2019-08-08 ENCOUNTER — Encounter: Payer: Self-pay | Admitting: *Deleted

## 2019-08-08 NOTE — Progress Notes (Signed)
Patient has completed nutrition and education appointments. He began treatment on 08/05/2019  Oncology Nurse Navigator Documentation  Oncology Nurse Navigator Flowsheets 08/08/2019  Abnormal Finding Date -  Confirmed Diagnosis Date -  Diagnosis Status -  Planned Course of Treatment Chemotherapy  Phase of Treatment Chemo  Chemotherapy Actual Start Date: 08/05/2019  Chemo/Radiation Concurrent Pending- Reason: -  Radiation Actual Start Date: -  Navigator Follow Up Date: 08/26/2019  Navigator Follow Up Reason: Follow-up Appointment;Chemotherapy  Navigator Location [facility]  Referral Date to RadOnc/MedOnc -  Navigator Encounter Type Appt/Treatment Plan Review  Telephone -  Treatment Initiated Date 08/05/2019  Patient Visit Type -  Treatment Phase Active Tx  Barriers/Navigation Needs -  Education -  Interventions -  Acuity -  Referrals -  Coordination of Care -  Education Method -  Support Groups/Services -  Time Spent with Patient 30

## 2019-08-09 ENCOUNTER — Encounter: Payer: Self-pay | Admitting: *Deleted

## 2019-08-09 NOTE — Progress Notes (Signed)
Called to speak to patient to follow up after his first treatment on Friday. He did very well with no side effects. He experienced mild-moderate fatigue on Saturday, but then felt back to baseline Sunday and Monday. He has had no nausea/vomitting or other side effects. He has no taken any prn medications, but he does have them in the home if needed.  Instructed patient to listen to his body and rest as needed. Encouraged him to take antiemetics if he became nauseated. Also instructed to call office with any questions, concerns or issues that come up.

## 2019-08-18 ENCOUNTER — Encounter: Payer: Self-pay | Admitting: *Deleted

## 2019-08-18 ENCOUNTER — Inpatient Hospital Stay: Payer: Medicare Other

## 2019-08-18 ENCOUNTER — Encounter: Payer: Self-pay | Admitting: Family

## 2019-08-18 ENCOUNTER — Other Ambulatory Visit: Payer: Self-pay

## 2019-08-18 ENCOUNTER — Inpatient Hospital Stay (HOSPITAL_BASED_OUTPATIENT_CLINIC_OR_DEPARTMENT_OTHER): Payer: Medicare Other | Admitting: Family

## 2019-08-18 ENCOUNTER — Ambulatory Visit (HOSPITAL_COMMUNITY)
Admission: RE | Admit: 2019-08-18 | Discharge: 2019-08-18 | Disposition: A | Discharge status: Home / Self Care | Payer: Medicare Other | Source: Ambulatory Visit | Attending: Family | Admitting: Family

## 2019-08-18 VITALS — BP 105/82 | HR 81 | Temp 96.8°F | Resp 18 | Ht 73.0 in | Wt 209.1 lb

## 2019-08-18 DIAGNOSIS — D508 Other iron deficiency anemias: Secondary | ICD-10-CM

## 2019-08-18 DIAGNOSIS — I2510 Atherosclerotic heart disease of native coronary artery without angina pectoris: Secondary | ICD-10-CM | POA: Diagnosis not present

## 2019-08-18 DIAGNOSIS — C220 Liver cell carcinoma: Secondary | ICD-10-CM

## 2019-08-18 DIAGNOSIS — E8809 Other disorders of plasma-protein metabolism, not elsewhere classified: Secondary | ICD-10-CM | POA: Diagnosis not present

## 2019-08-18 DIAGNOSIS — I83893 Varicose veins of bilateral lower extremities with other complications: Secondary | ICD-10-CM | POA: Diagnosis present

## 2019-08-18 DIAGNOSIS — C7800 Secondary malignant neoplasm of unspecified lung: Secondary | ICD-10-CM

## 2019-08-18 DIAGNOSIS — Z5112 Encounter for antineoplastic immunotherapy: Secondary | ICD-10-CM | POA: Diagnosis not present

## 2019-08-18 LAB — CBC WITH DIFFERENTIAL (CANCER CENTER ONLY)
Abs Immature Granulocytes: 0.04 10*3/uL (ref 0.00–0.07)
Basophils Absolute: 0.1 10*3/uL (ref 0.0–0.1)
Basophils Relative: 1 %
Eosinophils Absolute: 0 10*3/uL (ref 0.0–0.5)
Eosinophils Relative: 0 %
HCT: 39.5 % (ref 39.0–52.0)
Hemoglobin: 12.9 g/dL — ABNORMAL LOW (ref 13.0–17.0)
Immature Granulocytes: 0 %
Lymphocytes Relative: 10 %
Lymphs Abs: 1 10*3/uL (ref 0.7–4.0)
MCH: 32.3 pg (ref 26.0–34.0)
MCHC: 32.7 g/dL (ref 30.0–36.0)
MCV: 98.8 fL (ref 80.0–100.0)
Monocytes Absolute: 1.3 10*3/uL — ABNORMAL HIGH (ref 0.1–1.0)
Monocytes Relative: 13 %
Neutro Abs: 7.4 10*3/uL (ref 1.7–7.7)
Neutrophils Relative %: 76 %
Platelet Count: 109 10*3/uL — ABNORMAL LOW (ref 150–400)
RBC: 4 MIL/uL — ABNORMAL LOW (ref 4.22–5.81)
RDW: 20.2 % — ABNORMAL HIGH (ref 11.5–15.5)
WBC Count: 9.9 10*3/uL (ref 4.0–10.5)
nRBC: 0 % (ref 0.0–0.2)

## 2019-08-18 LAB — CMP (CANCER CENTER ONLY)
ALT: 46 U/L — ABNORMAL HIGH (ref 0–44)
AST: 118 U/L — ABNORMAL HIGH (ref 15–41)
Albumin: 3 g/dL — ABNORMAL LOW (ref 3.5–5.0)
Alkaline Phosphatase: 115 U/L (ref 38–126)
Anion gap: 9 (ref 5–15)
BUN: 9 mg/dL (ref 8–23)
CO2: 22 mmol/L (ref 22–32)
Calcium: 8.5 mg/dL — ABNORMAL LOW (ref 8.9–10.3)
Chloride: 100 mmol/L (ref 98–111)
Creatinine: 0.58 mg/dL — ABNORMAL LOW (ref 0.61–1.24)
GFR, Est AFR Am: >60 mL/min (ref >60)
GFR, Estimated: >60 mL/min (ref >60)
Glucose, Bld: 125 mg/dL — ABNORMAL HIGH (ref 70–99)
Potassium: 4.5 mmol/L (ref 3.5–5.1)
Sodium: 131 mmol/L — ABNORMAL LOW (ref 135–145)
Total Bilirubin: 2.6 mg/dL — ABNORMAL HIGH (ref 0.3–1.2)
Total Protein: 6 g/dL — ABNORMAL LOW (ref 6.5–8.1)

## 2019-08-18 NOTE — Progress Notes (Signed)
Patient's wife, Idelle Jo, calling to notify us of patient symptoms.  Patient has had increased right hip pain. Yesterday was a bad day where he had difficulty with ambulation. He has been treating with tylenol. He has a prescription for Tramadol but has not yet started it.   Reviewed with the wife, prescription directions for tramadol and instructed her to have patient begin taking tramadol for pain.  Patient is also having BLE swelling/edema which has started to cause weeping. He is saturating through clothes and bed sheets. She tried wrapping the legs in gauze. Unfortunately when she removed the gauze, skin was removed and now he has open wounds to his legs. She states there is some bleeding.   Reviewed this symptom with Dr Marin Olp. He would like patient to come in and be seen today.  Spoke with Arbie Cookey. Patient will come in today and be seen to assess for pain and weeping legs.

## 2019-08-18 NOTE — Progress Notes (Signed)
Hematology and Oncology Follow Up Visit  Kerry Adams [id number] 12-08-1955 64 y.o. 08/18/2019   Principle Diagnosis:  Hepatocellular carcinoma -- mets to bone, lung, lymph nodes  Current Therapy:        Palliative Radiation to the RIGHT femur Tecentriq/Avastin -- started 08/05/2019, s/p cycle 1 Xgeva 120 mg sq q 3 months -- next dose in 09/2019   Interim History:  Kerry Adams is here today with his wife with c/o pain in his left hip and leg as well as weeping now with his lower extremity swelling. He has not yet started taking th Ultram. He plans to start today.  The weeping was noted after removing his leg wraps and a little dried skin came off with the wrap. The is a little serous fluid noted at a few of these spots in the left leg. No bleeding noted. Pedal pulses are 1+ and he has hyperpigmentation of both lower extremities below the knees suggestive of peripheral vascular disease.  His albumin is 3 and total protein 6.    He states that he was laying in bed a few days ago and heard a "pop" in his right groin".  He already stopped taking his Norvasc several weeks ago.  He states that he had a fall this week but did not injure himself.  No fever, chills, n/v, cough, rash, dizziness, SOB, chest pain, palpitations, abdominal pain or changes in bowel or bladder habits.  He has occasional diarrhea.  His appetite comes and goes and he is doing his best to stay hydrated. His weight is down 8 lbs since his last visit on 6/11.    ECOG Performance Status: 1 - Symptomatic but completely ambulatory  Medications:  Allergies as of 08/18/2019   No Known Allergies     Medication List       Accurate as of August 18, 2019 12:58 PM. If you have any questions, ask your nurse or doctor.        acetaminophen 500 MG tablet Commonly known as: TYLENOL Take 500 mg by mouth every 8 (eight) hours as needed (pain).   amLODipine 5 MG tablet Commonly known as: NORVASC Take 1 tablet (5 mg total)  by mouth daily.   amoxicillin 500 MG capsule Commonly known as: AMOXIL Take 2,000 mg by mouth as directed. Before dental work   famotidine 10 MG tablet Commonly known as: PEPCID Take 10 mg by mouth every morning.   Imodium A-D 2 MG tablet Generic drug: loperamide Take 1 mg by mouth at bedtime as needed for diarrhea or loose stools. Takes 1/2 tablet total of 1 mg at bedtime.   metoprolol tartrate 50 MG tablet Commonly known as: LOPRESSOR Take 1 tablet (50 mg total) by mouth 2 (two) times daily.   NYQUIL MULTI-SYMPTOM PO Take 1 capsule by mouth at bedtime.   ondansetron 8 MG tablet Commonly known as: Zofran Take 1 tablet (8 mg total) by mouth 2 (two) times daily as needed (Nausea or vomiting).   Potassium 99 MG Tabs Take 99 mg by mouth daily.   prochlorperazine 10 MG tablet Commonly known as: COMPAZINE Take 1 tablet (10 mg total) by mouth every 6 (six) hours as needed (Nausea or vomiting).   rivaroxaban 20 MG Tabs tablet Commonly known as: Xarelto Take 1 tablet (20 mg total) by mouth daily with supper.   traMADol 50 MG tablet Commonly known as: ULTRAM Take 1 tablet (50 mg total) by mouth every 6 (six) hours as needed.   trolamine  salicylate 10 % cream Commonly known as: ASPERCREME Apply 1 application topically as needed for muscle pain.       Allergies: No Known Allergies  Past Medical History, Surgical history, Social history, and Family History were reviewed and updated.  Review of Systems: All other 10 point review of systems is negative.   Physical Exam:  vitals were not taken for this visit.   Wt Readings from Last 3 Encounters:  08/05/19 217 lb 1.9 oz (98.5 kg)  07/08/19 203 lb (92.1 kg)  06/30/19 206 lb 12.8 oz (93.8 kg)    Ocular: Sclerae unicteric, pupils equal, round and reactive to light Ear-nose-throat: Oropharynx clear, dentition fair Lymphatic: No cervical or supraclavicular adenopathy Lungs no rales or rhonchi, good excursion  bilaterally Heart regular rate and rhythm, no murmur appreciated Abd soft, nontender, positive bowel sounds, no liver or spleen tip palpated on exam, no fluid wave  MSK no focal spinal tenderness, no joint edema Neuro: non-focal, well-oriented, appropriate affect Breasts: Deferred   Lab Results  Component Value Date   WBC 5.7 08/05/2019   HGB 12.5 (L) 08/05/2019   HCT 38.4 (L) 08/05/2019   MCV 95.3 08/05/2019   PLT 148 (L) 08/05/2019   No results found for: FERRITIN, IRON, TIBC, UIBC, IRONPCTSAT Lab Results  Component Value Date   RBC 4.03 (L) 08/05/2019   Lab Results  Component Value Date   KPAFRELGTCHN 23.3 (H) 06/20/2019   LAMBDASER 19.9 06/20/2019   KAPLAMBRATIO 1.17 06/20/2019   Lab Results  Component Value Date   IGGSERUM 1,078 06/20/2019   IGA 457 (H) 06/20/2019   IGMSERUM 80 06/20/2019   Lab Results  Component Value Date   TOTALPROTELP 6.2 06/20/2019   ALBUMINELP 3.2 06/20/2019   A1GS 0.3 06/20/2019   A2GS 0.6 06/20/2019   BETS 1.2 06/20/2019   GAMS 0.9 06/20/2019   MSPIKE Not Observed 06/20/2019     Chemistry      Component Value Date/Time   NA 130 (L) 08/05/2019 1003   NA 137 05/13/2019 1110   K 5.1 08/05/2019 1003   CL 98 08/05/2019 1003   CO2 24 08/05/2019 1003   BUN 10 08/05/2019 1003   BUN 7 (L) 05/13/2019 1110   CREATININE 0.68 08/05/2019 1003      Component Value Date/Time   CALCIUM 8.5 (L) 08/05/2019 1003   ALKPHOS 99 08/05/2019 1003   AST 59 (H) 08/05/2019 1003   ALT 24 08/05/2019 1003   BILITOT 2.4 (H) 08/05/2019 1003       Impression and Plan: Kerry Adams is a very pleasant 64 yo caucasian gentleman with metastatic hepatocellular carcinoma.  [place] today showed no evidence of DVT.  I spoke with Dr. Marin Olp and we will get him scheduled for Albumin 50 gm with Lasix 60 mg IV 1 hour after.  We will see him again on 08/26/2019 for follow-up.  They will contact our office with any questions or concerns. We can certainly see him sooner if  needed.   Laverna Peace, NP 6/24/202112:58 PM

## 2019-08-19 ENCOUNTER — Telehealth: Payer: Self-pay | Admitting: Family

## 2019-08-19 LAB — FERRITIN: Ferritin: 738 ng/mL — ABNORMAL HIGH (ref 24–336)

## 2019-08-19 LAB — IRON AND TIBC
Iron: 63 ug/dL (ref 42–163)
Saturation Ratios: 18 % — ABNORMAL LOW (ref 20–55)
TIBC: 341 ug/dL (ref 202–409)
UIBC: 278 ug/dL (ref 117–376)

## 2019-08-19 NOTE — Telephone Encounter (Signed)
NO LOS FOR 6/24 

## 2019-08-22 MED ORDER — FUROSEMIDE 10 MG/ML IJ SOLN: 60.0000 mg | Freq: Once | INTRAMUSCULAR | Status: DC

## 2019-08-22 NOTE — Progress Notes (Incomplete)
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 He did report mild fatigue, diarrhea controlled with Imodium, mild nausea, poor appetite, and baseline right hip pain that did improve with treatment. He denied issues with balance and changes in bladder patterns. He was able to ambulate with the assistance of a walker.  Plan: The patient will follow-up with radiation oncology in one month.  ________________________________________________   Blair Promise, PhD, MD  This document serves as a record of services personally performed by Gery Pray, MD. It was created on his behalf by Clerance Lav, a trained medical scribe. The creation of this record is based on the scribe's personal observations and the provider's statements to them. This document has been checked and approved by the attending provider.

## 2019-08-25 ENCOUNTER — Encounter: Payer: Self-pay | Admitting: *Deleted

## 2019-08-25 ENCOUNTER — Inpatient Hospital Stay (HOSPITAL_COMMUNITY)
Admission: EM | Admit: 2019-08-25 | Discharge: 2019-09-02 | DRG: 309 | Disposition: A | Discharge status: Home Health | Payer: Medicare Other | Attending: Family Medicine | Admitting: Family Medicine

## 2019-08-25 ENCOUNTER — Other Ambulatory Visit: Payer: Self-pay

## 2019-08-25 ENCOUNTER — Encounter (HOSPITAL_COMMUNITY): Payer: Self-pay

## 2019-08-25 ENCOUNTER — Emergency Department (HOSPITAL_COMMUNITY): Payer: Medicare Other

## 2019-08-25 DIAGNOSIS — Z515 Encounter for palliative care: Secondary | ICD-10-CM | POA: Diagnosis not present

## 2019-08-25 DIAGNOSIS — I4891 Unspecified atrial fibrillation: Secondary | ICD-10-CM | POA: Diagnosis present

## 2019-08-25 DIAGNOSIS — K7460 Unspecified cirrhosis of liver: Secondary | ICD-10-CM | POA: Diagnosis present

## 2019-08-25 DIAGNOSIS — I4821 Permanent atrial fibrillation: Secondary | ICD-10-CM | POA: Diagnosis not present

## 2019-08-25 DIAGNOSIS — C7801 Secondary malignant neoplasm of right lung: Secondary | ICD-10-CM | POA: Diagnosis present

## 2019-08-25 DIAGNOSIS — Z20822 Contact with and (suspected) exposure to covid-19: Secondary | ICD-10-CM | POA: Diagnosis present

## 2019-08-25 DIAGNOSIS — W1839XA Other fall on same level, initial encounter: Secondary | ICD-10-CM | POA: Diagnosis present

## 2019-08-25 DIAGNOSIS — C220 Liver cell carcinoma: Secondary | ICD-10-CM | POA: Diagnosis not present

## 2019-08-25 DIAGNOSIS — I10 Essential (primary) hypertension: Secondary | ICD-10-CM | POA: Diagnosis not present

## 2019-08-25 DIAGNOSIS — E861 Hypovolemia: Secondary | ICD-10-CM | POA: Diagnosis present

## 2019-08-25 DIAGNOSIS — E785 Hyperlipidemia, unspecified: Secondary | ICD-10-CM | POA: Diagnosis present

## 2019-08-25 DIAGNOSIS — M160 Bilateral primary osteoarthritis of hip: Secondary | ICD-10-CM | POA: Diagnosis present

## 2019-08-25 DIAGNOSIS — Z7189 Other specified counseling: Secondary | ICD-10-CM

## 2019-08-25 DIAGNOSIS — C4021 Malignant neoplasm of long bones of right lower limb: Secondary | ICD-10-CM | POA: Diagnosis present

## 2019-08-25 DIAGNOSIS — Y92009 Unspecified place in unspecified non-institutional (private) residence as the place of occurrence of the external cause: Secondary | ICD-10-CM

## 2019-08-25 DIAGNOSIS — D6959 Other secondary thrombocytopenia: Secondary | ICD-10-CM | POA: Diagnosis present

## 2019-08-25 DIAGNOSIS — R441 Visual hallucinations: Secondary | ICD-10-CM | POA: Diagnosis present

## 2019-08-25 DIAGNOSIS — R531 Weakness: Secondary | ICD-10-CM | POA: Diagnosis not present

## 2019-08-25 DIAGNOSIS — K219 Gastro-esophageal reflux disease without esophagitis: Secondary | ICD-10-CM | POA: Diagnosis present

## 2019-08-25 DIAGNOSIS — C778 Secondary and unspecified malignant neoplasm of lymph nodes of multiple regions: Secondary | ICD-10-CM | POA: Diagnosis present

## 2019-08-25 DIAGNOSIS — Z79899 Other long term (current) drug therapy: Secondary | ICD-10-CM

## 2019-08-25 DIAGNOSIS — Z66 Do not resuscitate: Secondary | ICD-10-CM | POA: Diagnosis not present

## 2019-08-25 DIAGNOSIS — E222 Syndrome of inappropriate secretion of antidiuretic hormone: Secondary | ICD-10-CM | POA: Diagnosis present

## 2019-08-25 DIAGNOSIS — I4819 Other persistent atrial fibrillation: Secondary | ICD-10-CM | POA: Diagnosis not present

## 2019-08-25 DIAGNOSIS — Z923 Personal history of irradiation: Secondary | ICD-10-CM

## 2019-08-25 DIAGNOSIS — C7971 Secondary malignant neoplasm of right adrenal gland: Secondary | ICD-10-CM | POA: Diagnosis present

## 2019-08-25 DIAGNOSIS — F10931 Alcohol use, unspecified with withdrawal delirium: Secondary | ICD-10-CM

## 2019-08-25 DIAGNOSIS — Z7901 Long term (current) use of anticoagulants: Secondary | ICD-10-CM

## 2019-08-25 DIAGNOSIS — R296 Repeated falls: Secondary | ICD-10-CM | POA: Diagnosis present

## 2019-08-25 DIAGNOSIS — M25551 Pain in right hip: Secondary | ICD-10-CM | POA: Diagnosis present

## 2019-08-25 DIAGNOSIS — T451X5A Adverse effect of antineoplastic and immunosuppressive drugs, initial encounter: Secondary | ICD-10-CM | POA: Diagnosis present

## 2019-08-25 DIAGNOSIS — Z96653 Presence of artificial knee joint, bilateral: Secondary | ICD-10-CM | POA: Diagnosis present

## 2019-08-25 DIAGNOSIS — I2510 Atherosclerotic heart disease of native coronary artery without angina pectoris: Secondary | ICD-10-CM | POA: Diagnosis present

## 2019-08-25 DIAGNOSIS — F10139 Alcohol abuse with withdrawal, unspecified: Secondary | ICD-10-CM

## 2019-08-25 DIAGNOSIS — F10231 Alcohol dependence with withdrawal delirium: Secondary | ICD-10-CM

## 2019-08-25 DIAGNOSIS — Z8249 Family history of ischemic heart disease and other diseases of the circulatory system: Secondary | ICD-10-CM

## 2019-08-25 LAB — COMPREHENSIVE METABOLIC PANEL
ALT: 34 U/L (ref 0–44)
AST: 72 U/L — ABNORMAL HIGH (ref 15–41)
Albumin: 2.5 g/dL — ABNORMAL LOW (ref 3.5–5.0)
Alkaline Phosphatase: 119 U/L (ref 38–126)
Anion gap: 8 (ref 5–15)
BUN: 8 mg/dL (ref 8–23)
CO2: 22 mmol/L (ref 22–32)
Calcium: 7.8 mg/dL — ABNORMAL LOW (ref 8.9–10.3)
Chloride: 99 mmol/L (ref 98–111)
Creatinine, Ser: 0.5 mg/dL — ABNORMAL LOW (ref 0.61–1.24)
GFR calc Af Amer: >60 mL/min (ref >60)
GFR calc non Af Amer: >60 mL/min (ref >60)
Glucose, Bld: 108 mg/dL — ABNORMAL HIGH (ref 70–99)
Potassium: 4.6 mmol/L (ref 3.5–5.1)
Sodium: 129 mmol/L — ABNORMAL LOW (ref 135–145)
Total Bilirubin: 2.6 mg/dL — ABNORMAL HIGH (ref 0.3–1.2)
Total Protein: 6 g/dL — ABNORMAL LOW (ref 6.5–8.1)

## 2019-08-25 LAB — CBC WITH DIFFERENTIAL/PLATELET
Abs Immature Granulocytes: 0.05 10*3/uL (ref 0.00–0.07)
Basophils Absolute: 0.1 10*3/uL (ref 0.0–0.1)
Basophils Relative: 1 %
Eosinophils Absolute: 0.1 10*3/uL (ref 0.0–0.5)
Eosinophils Relative: 1 %
HCT: 39 % (ref 39.0–52.0)
Hemoglobin: 12.5 g/dL — ABNORMAL LOW (ref 13.0–17.0)
Immature Granulocytes: 1 %
Lymphocytes Relative: 9 %
Lymphs Abs: 0.7 10*3/uL (ref 0.7–4.0)
MCH: 32.3 pg (ref 26.0–34.0)
MCHC: 32.1 g/dL (ref 30.0–36.0)
MCV: 100.8 fL — ABNORMAL HIGH (ref 80.0–100.0)
Monocytes Absolute: 1.3 10*3/uL — ABNORMAL HIGH (ref 0.1–1.0)
Monocytes Relative: 16 %
Neutro Abs: 5.7 10*3/uL (ref 1.7–7.7)
Neutrophils Relative %: 72 %
Platelets: 109 10*3/uL — ABNORMAL LOW (ref 150–400)
RBC: 3.87 MIL/uL — ABNORMAL LOW (ref 4.22–5.81)
RDW: 19.2 % — ABNORMAL HIGH (ref 11.5–15.5)
WBC: 7.8 10*3/uL (ref 4.0–10.5)
nRBC: 0 % (ref 0.0–0.2)

## 2019-08-25 LAB — VITAMIN B12: Vitamin B-12: 994 pg/mL — ABNORMAL HIGH (ref 180–914)

## 2019-08-25 LAB — URINALYSIS, ROUTINE W REFLEX MICROSCOPIC
Glucose, UA: NEGATIVE mg/dL
Hgb urine dipstick: NEGATIVE
Ketones, ur: NEGATIVE mg/dL
Leukocytes,Ua: NEGATIVE
Nitrite: NEGATIVE
Protein, ur: NEGATIVE mg/dL
Specific Gravity, Urine: 1.014 (ref 1.005–1.030)
pH: 6 (ref 5.0–8.0)

## 2019-08-25 LAB — PHOSPHORUS: Phosphorus: 2.9 mg/dL (ref 2.5–4.6)

## 2019-08-25 LAB — TROPONIN I (HIGH SENSITIVITY)
Troponin I (High Sensitivity): 13 ng/L (ref <18)
Troponin I (High Sensitivity): 13 ng/L (ref <18)

## 2019-08-25 LAB — CBG MONITORING, ED: Glucose-Capillary: 106 mg/dL — ABNORMAL HIGH (ref 70–99)

## 2019-08-25 LAB — SARS CORONAVIRUS 2 BY RT PCR (HOSPITAL ORDER, PERFORMED IN ~~LOC~~ HOSPITAL LAB): SARS Coronavirus 2: NEGATIVE

## 2019-08-25 LAB — SODIUM, URINE, RANDOM: Sodium, Ur: 95 mmol/L

## 2019-08-25 LAB — MRSA PCR SCREENING: MRSA by PCR: NEGATIVE

## 2019-08-25 LAB — FOLATE: Folate: 7.9 ng/mL (ref >5.9)

## 2019-08-25 LAB — T4, FREE: Free T4: 0.79 ng/dL (ref 0.61–1.12)

## 2019-08-25 LAB — BRAIN NATRIURETIC PEPTIDE: B Natriuretic Peptide: 633 pg/mL — ABNORMAL HIGH (ref 0.0–100.0)

## 2019-08-25 LAB — CREATININE, URINE, RANDOM: Creatinine, Urine: 153.01 mg/dL

## 2019-08-25 LAB — OSMOLALITY: Osmolality: 280 mOsm/kg (ref 275–295)

## 2019-08-25 LAB — TSH: TSH: 3.265 u[IU]/mL (ref 0.350–4.500)

## 2019-08-25 MED ORDER — FOLIC ACID 1 MG PO TABS
1.0000 mg | ORAL_TABLET | Freq: Every day | ORAL | Status: DC
  Administered 2019-08-25 – 2019-08-26 (×2): 1 mg via ORAL
  Filled 2019-08-25 (×3): qty 1

## 2019-08-25 MED ORDER — THIAMINE HCL 100 MG PO TABS
100.0000 mg | ORAL_TABLET | Freq: Every day | ORAL | Status: DC
  Administered 2019-08-25 – 2019-09-01 (×7): 100 mg via ORAL
  Filled 2019-08-25 (×9): qty 1

## 2019-08-25 MED ORDER — LORAZEPAM 1 MG PO TABS
1.0000 mg | ORAL_TABLET | ORAL | Status: AC | PRN
  Administered 2019-08-26 (×2): 1 mg via ORAL
  Filled 2019-08-25 (×2): qty 1

## 2019-08-25 MED ORDER — CHLORHEXIDINE GLUCONATE CLOTH 2 % EX PADS
6.0000 | MEDICATED_PAD | Freq: Every day | CUTANEOUS | Status: DC
  Administered 2019-08-25 – 2019-09-01 (×8): 6 via TOPICAL

## 2019-08-25 MED ORDER — ONDANSETRON HCL 4 MG PO TABS: 4.0000 mg | ORAL_TABLET | Freq: Four times a day (QID) | ORAL | Status: DC | PRN

## 2019-08-25 MED ORDER — ACETAMINOPHEN 325 MG PO TABS: 650.0000 mg | ORAL_TABLET | Freq: Four times a day (QID) | ORAL | Status: DC | PRN

## 2019-08-25 MED ORDER — DILTIAZEM LOAD VIA INFUSION
10.0000 mg | Freq: Once | INTRAVENOUS | Status: AC
  Administered 2019-08-25: 10 mg via INTRAVENOUS
  Filled 2019-08-25: qty 10

## 2019-08-25 MED ORDER — HYDROMORPHONE HCL 1 MG/ML IJ SOLN
1.0000 mg | Freq: Once | INTRAMUSCULAR | Status: AC
  Administered 2019-08-25: 1 mg via INTRAVENOUS
  Filled 2019-08-25: qty 1

## 2019-08-25 MED ORDER — OXYCODONE HCL 5 MG PO TABS
5.0000 mg | ORAL_TABLET | ORAL | Status: DC | PRN
  Administered 2019-08-25 – 2019-09-01 (×16): 5 mg via ORAL
  Filled 2019-08-25 (×16): qty 1

## 2019-08-25 MED ORDER — POLYETHYLENE GLYCOL 3350 17 G PO PACK
17.0000 g | PACK | Freq: Every day | ORAL | Status: DC
  Administered 2019-08-25 – 2019-08-31 (×5): 17 g via ORAL
  Filled 2019-08-25 (×9): qty 1

## 2019-08-25 MED ORDER — METOPROLOL TARTRATE 50 MG PO TABS
50.0000 mg | ORAL_TABLET | Freq: Two times a day (BID) | ORAL | Status: DC
  Administered 2019-08-25 – 2019-08-26 (×3): 50 mg via ORAL
  Filled 2019-08-25 (×4): qty 1

## 2019-08-25 MED ORDER — METOPROLOL TARTRATE 5 MG/5ML IV SOLN
5.0000 mg | Freq: Once | INTRAVENOUS | Status: AC
  Administered 2019-08-25: 5 mg via INTRAVENOUS
  Filled 2019-08-25: qty 5

## 2019-08-25 MED ORDER — THIAMINE HCL 100 MG/ML IJ SOLN
100.0000 mg | Freq: Every day | INTRAMUSCULAR | Status: DC
  Administered 2019-08-27 – 2019-09-02 (×2): 100 mg via INTRAVENOUS
  Filled 2019-08-25 (×2): qty 2

## 2019-08-25 MED ORDER — SODIUM CHLORIDE 0.9 % IV BOLUS
500.0000 mL | Freq: Once | INTRAVENOUS | Status: AC
  Administered 2019-08-25: 500 mL via INTRAVENOUS

## 2019-08-25 MED ORDER — RIVAROXABAN 20 MG PO TABS
20.0000 mg | ORAL_TABLET | Freq: Every day | ORAL | Status: DC
  Administered 2019-08-25 – 2019-09-01 (×7): 20 mg via ORAL
  Filled 2019-08-25 (×7): qty 1

## 2019-08-25 MED ORDER — SENNA 8.6 MG PO TABS
2.0000 | ORAL_TABLET | Freq: Every day | ORAL | Status: DC
  Administered 2019-08-25 – 2019-09-02 (×4): 17.2 mg via ORAL
  Filled 2019-08-25 (×8): qty 2

## 2019-08-25 MED ORDER — ACETAMINOPHEN 650 MG RE SUPP: 650.0000 mg | Freq: Four times a day (QID) | RECTAL | Status: DC | PRN

## 2019-08-25 MED ORDER — SODIUM CHLORIDE 0.9 % IV SOLN: INTRAVENOUS | Status: AC

## 2019-08-25 MED ORDER — METOPROLOL TARTRATE 5 MG/5ML IV SOLN
2.5000 mg | Freq: Once | INTRAVENOUS | Status: AC
  Administered 2019-08-25: 2.5 mg via INTRAVENOUS
  Filled 2019-08-25: qty 5

## 2019-08-25 MED ORDER — DILTIAZEM HCL-DEXTROSE 125-5 MG/125ML-% IV SOLN (PREMIX)
5.0000 mg/h | INTRAVENOUS | Status: DC
  Administered 2019-08-25: 5 mg/h via INTRAVENOUS
  Administered 2019-08-26: 10 mg/h via INTRAVENOUS
  Filled 2019-08-25 (×2): qty 125

## 2019-08-25 MED ORDER — ADULT MULTIVITAMIN W/MINERALS CH
1.0000 | ORAL_TABLET | Freq: Every day | ORAL | Status: DC
  Administered 2019-08-25 – 2019-08-26 (×2): 1 via ORAL
  Filled 2019-08-25 (×3): qty 1

## 2019-08-25 MED ORDER — HYDROMORPHONE HCL 1 MG/ML IJ SOLN
0.5000 mg | INTRAMUSCULAR | Status: DC | PRN
  Administered 2019-08-26 – 2019-08-30 (×2): 0.5 mg via INTRAVENOUS
  Filled 2019-08-25 (×2): qty 0.5

## 2019-08-25 MED ORDER — ONDANSETRON HCL 4 MG/2ML IJ SOLN: 4.0000 mg | Freq: Four times a day (QID) | INTRAMUSCULAR | Status: DC | PRN

## 2019-08-25 MED ORDER — LORAZEPAM 2 MG/ML IJ SOLN
1.0000 mg | INTRAMUSCULAR | Status: AC | PRN
  Administered 2019-08-26 (×2): 2 mg via INTRAVENOUS
  Administered 2019-08-27: 4 mg via INTRAVENOUS
  Administered 2019-08-27: 2 mg via INTRAVENOUS
  Filled 2019-08-25: qty 2
  Filled 2019-08-25 (×3): qty 1

## 2019-08-25 MED ORDER — FAMOTIDINE 20 MG PO TABS
10.0000 mg | ORAL_TABLET | ORAL | Status: DC
  Administered 2019-08-26: 10 mg via ORAL
  Filled 2019-08-25: qty 1

## 2019-08-25 NOTE — ED Provider Notes (Signed)
[facility] Provider Note   CSN: [id number] Arrival date & time: 08/25/19  0901     History Chief Complaint  Patient presents with  . Fall    Kerry Adams is a 64 y.o. male.  HPI 64 year old male presents with falls, bilateral leg weakness and right hip pain.  He states he fell 2 days ago as well as the day prior to that.  Both times it felt like his legs gave out from him.  He has a known right hip mass and this pain seems to be worse since the fall.  States he also hit his head.  He has been taking Tylenol and tramadol which does help but it is temporary.  He states he is receiving treatment for cancer at the oncology center.  No fevers, chest pain, shortness of breath.  The leg weakness is relatively new.  He has had trouble with his right leg for quite some time and uses a walker but cannot really move it very well.  Given his persistent pain his wife called EMS.   Past Medical History:  Diagnosis Date  . Abnormal EKG   . Alcohol use 02/18/2014  . Arthritis    osteo knees and hips   . Atrial fibrillation ([place])   . CAD (coronary artery disease)    WITHOUT ANGINA PECTORIS, UNSPECIFIED VESSEL OR LESION TYPE, UNSPECIFIED WHETHER NATIVE OR TRANSPLANTED HEART  . 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 Chronic liver disease   . Cirrhosis ([place])   . Closed right hip fracture ([place]) 02/18/2014  . Dizziness   . Dysrhythmia   . Elevated liver function tests   . GERD (gastroesophageal reflux disease)    treats with OTC Ranitidine  . Goals of care, counseling/discussion 06/20/2019  . Hepatocellular carcinoma metastatic to lung ([place]) 07/08/2019  . 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 Hip fracture requiring operative repair ([place]) 02/19/2014  . Hyperlipidemia   . Hypertension   . Metastasis to right adrenal gland of unknown origin ([place]) 06/20/2019  . 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 Osteoarthritis of knee, unilateral 03/01/2015  . Primary osteoarthritis of right knee 05/23/2015  . Thrombocytopenia [facility])     Patient Active Problem List   Diagnosis Date Noted  . Atrial fibrillation with RVR ([place]) 08/25/2019  . Hepatocellular carcinoma metastatic to lung ([place]) 07/08/2019  . 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 Hepatocellular carcinoma metastatic to bone ([place]) 07/07/2019  . Goals of care, counseling/discussion 06/20/2019  . Other persistent atrial fibrillation ([place])   . Primary osteoarthritis of right knee 05/23/2015  . Osteoarthritis of knee, unilateral 03/01/2015  . Hip fracture requiring operative repair ([place]) 02/19/2014  . Closed right hip fracture ([place]) 02/18/2014  . Hypertension 02/18/2014  . Chronic liver disease 02/18/2014  . Alcohol use 02/18/2014    Past Surgical History:  Procedure Laterality Date  . CARDIOVERSION N/A 09/13/2018   Procedure: CARDIOVERSION;  Surgeon: Acie Fredrickson Wonda Cheng, MD;  Location: [facility] ENDOSCOPY;  Service: Cardiovascular;  Laterality: N/A;  . FRACTURE SURGERY     right femur  . HERNIA REPAIR     umbilical  . INTERTROCHANTERIC HIP FRACTURE SURGERY Right 02/19/2014  . INTRAMEDULLARY (IM) NAIL INTERTROCHANTERIC Right 02/19/2014   Procedure: INTRAMEDULLARY (IM) NAIL INTERTROCHANTRIC;  Surgeon: [name], MD;  Location: [facility];  Service: Orthopedics;  Laterality: Right;  .  SHOULDER SURGERY Right   . TOTAL KNEE ARTHROPLASTY Left 03/01/2015   Procedure: TOTAL KNEE ARTHROPLASTY;  Surgeon: [name], MD;  Location: [facility];  Service: Orthopedics;  Laterality: Left;  Left knee arthroplasty  . TOTAL KNEE ARTHROPLASTY Right 05/23/2015   Procedure: TOTAL KNEE ARTHROPLASTY W/HARDWARE REMOVAL FEMUR;  Surgeon: Frederik Pear, MD;  Location: [facility];  Service: Orthopedics;  Laterality: Right;       Family History  Problem Relation Age of  Onset  . Hypertension Mother   . Stroke Father   . Healthy Brother     Social History   Tobacco Use  . Smoking status: Never Smoker  . Smokeless tobacco: Never Used  Vaping Use  . Vaping Use: Never used  Substance Use Topics  . Alcohol use: Yes    Alcohol/week: 32.0 standard drinks    Types: 32 Cans of beer per week    Comment: 6 beers /day  . Drug use: No    Home Medications Prior to Admission medications   Medication Sig Start Date End Date Taking? Authorizing Provider  acetaminophen (TYLENOL) 500 MG tablet Take 500 mg by mouth every 8 (eight) hours as needed (pain).   Yes [provider]  famotidine (PEPCID) 10 MG tablet Take 10 mg by mouth every morning.    Yes [provider]  loperamide (IMODIUM A-D) 2 MG tablet Take 1 mg by mouth at bedtime as needed for diarrhea or loose stools. Takes 1/2 tablet total of 1 mg at bedtime.   Yes [provider]  metoprolol tartrate (LOPRESSOR) 50 MG tablet Take 1 tablet (50 mg total) by mouth 2 (two) times daily. 07/01/19  Yes Camnitz, Will Hass[place] Done, MD  Potassium 99 MG TABS Take 99 mg by mouth daily.   Yes [provider]  rivaroxaban (XARELTO) 20 MG TABS tablet Take 1 tablet (20 mg total) by mouth daily with supper. 07/18/19  Yes Fay Records, MD  traMADol (ULTRAM) 50 MG tablet Take 1 tablet (50 mg total) by mouth every 6 (six) hours as needed. 06/20/19  Yes Volanda Napoleon, MD  trolamine salicylate (ASPERCREME) 10 % cream Apply 1 application topically as needed for muscle pain.   Yes [provider]  amoxicillin (AMOXIL) 500 MG capsule Take 2,000 mg by mouth as directed. Before dental work Patient not taking: Reported on 08/18/2019    [provider]  ondansetron (ZOFRAN) 8 MG tablet Take 1 tablet (8 mg total) by mouth 2 (two) times daily as needed (Nausea or vomiting). Patient not taking: Reported on 08/18/2019 07/26/19   Volanda Napoleon, MD  prochlorperazine (COMPAZINE) 10 MG tablet Take 1  tablet (10 mg total) by mouth every 6 (six) hours as needed (Nausea or vomiting). Patient not taking: Reported on 08/18/2019 07/26/19   Volanda Napoleon, MD  Pseudoeph-Doxylamine-DM-APAP (NYQUIL MULTI-SYMPTOM PO) Take 1 capsule by mouth at bedtime. Patient not taking: Reported on 08/18/2019    [provider]    Allergies    Patient has no known allergies.  Review of Systems   Review of Systems  Constitutional: Negative for fever.  Respiratory: Negative for shortness of breath.   Cardiovascular: Positive for leg swelling (chronic, at baseline). Negative for chest pain.  Gastrointestinal: Negative for abdominal pain and vomiting.  Musculoskeletal: Positive for arthralgias.  Neurological: Positive for weakness. Negative for headaches.  All other systems reviewed and are negative.   Physical Exam Updated Vital Signs BP 106/90   Pulse 93   Temp 98.2  F (36.8 C) (Oral)   Resp (!) 30   Ht 6\' 1"  (1.854 m)   Wt 94.9 kg   SpO2 96%   BMI 27.60 kg/m   Physical Exam Vitals and nursing note reviewed.  Constitutional:      General: He is not in acute distress.    Appearance: He is well-developed. He is not ill-appearing or diaphoretic.  HENT:     Head: Normocephalic and atraumatic.     Right Ear: External ear normal.     Left Ear: External ear normal.     Nose: Nose normal.  Eyes:     General:        Right eye: No discharge.        Left eye: No discharge.     Pupils: Pupils are equal, round, and reactive to light.  Cardiovascular:     Rate and Rhythm: Tachycardia present. Rhythm irregular.     Heart sounds: Normal heart sounds.  Pulmonary:     Effort: Pulmonary effort is normal.     Breath sounds: Normal breath sounds.  Abdominal:     Palpations: Abdomen is soft.     Tenderness: There is no abdominal tenderness.  Musculoskeletal:     Cervical back: Neck supple. No tenderness.     Thoracic back: No tenderness.     Lumbar back: No tenderness.     Right hip:  Tenderness present. No deformity.     Right upper leg: Tenderness (lateral, mid to upper thigh) present.     Right knee: No swelling. Normal range of motion. No tenderness.     Right lower leg: Edema present.     Left lower leg: Edema present.     Comments: Chronic appearing BLE edema. No tenderness or obvious infection  Skin:    General: Skin is warm and dry.  Neurological:     Mental Status: He is alert.     Comments: CN 3-12 grossly intact. 5/5 strength in bilateral upper extremities. 4/5 strength LLE. Due to pain cannot lift right upper extremity off stretcher. Grossly normal sensation. Normal finger to nose.   Psychiatric:        Mood and Affect: Mood is not anxious.     ED Results / Procedures / Treatments   Labs (all labs ordered are listed, but only abnormal results are displayed) Labs Reviewed  COMPREHENSIVE METABOLIC PANEL - Abnormal; Notable for the following components:      Result Value   Sodium 129 (*)    Glucose, Bld 108 (*)    Creatinine, Ser 0.50 (*)    Calcium 7.8 (*)    Total Protein 6.0 (*)    Albumin 2.5 (*)    AST 72 (*)    Total Bilirubin 2.6 (*)    All other components within normal limits  BRAIN NATRIURETIC PEPTIDE - Abnormal; Notable for the following components:   B Natriuretic Peptide 633.0 (*)    All other components within normal limits  CBC WITH DIFFERENTIAL/PLATELET - Abnormal; Notable for the following components:   RBC 3.87 (*)    Hemoglobin 12.5 (*)    MCV 100.8 (*)    RDW 19.2 (*)    Platelets 109 (*)    Monocytes Absolute 1.3 (*)    All other components within normal limits  URINALYSIS, ROUTINE W REFLEX MICROSCOPIC - Abnormal; Notable for the following components:   Color, Urine AMBER (*)    Bilirubin Urine SMALL (*)    All other components within normal limits  CBG MONITORING, ED - Abnormal; Notable for the following components:   Glucose-Capillary 106 (*)    All other components within normal limits  SARS CORONAVIRUS 2 BY RT PCR  (HOSPITAL ORDER, [facility])  TROPONIN I (HIGH SENSITIVITY)  TROPONIN I (HIGH SENSITIVITY)    EKG EKG Interpretation  Date/Time:  Thursday August 25 2019 09:07:36 EDT Ventricular Rate:  119 PR Interval:    QRS Duration: 105 QT Interval:  347 QTC Calculation: 489 R Axis:   107 Text Interpretation: Right and left arm electrode reversal, interpretation assumes no reversal Atrial fibrillation Right axis deviation Abnormal T, consider ischemia, lateral leads afib appears new since 2020 Confirmed by Sherwood Gambler [id number]) on 08/25/2019 9:14:43 AM   Radiology DG Chest 1 View  Result Date: 08/25/2019 CLINICAL DATA:  Fall. EXAM: CHEST  1 VIEW COMPARISON:  February 20, 2015.  June 20, 2019. FINDINGS: Stable cardiomegaly. No pneumothorax or pleural effusion is noted. Old right rib fractures are noted. No acute pulmonary disease is noted. Large rounded mass is noted projected over right lung apex consistent with expansile bone lesion involving the right first rib as described on prior CT scan, concerning for malignancy. IMPRESSION: Large rounded mass is noted projected over right lung apex consistent with expansile bone lesion involving the right first rib as described on prior CT scan, concerning for malignancy. No acute pulmonary disease is noted. Aortic Atherosclerosis (ICD10-I70.0). Electronically Signed   By: Marijo Conception M.D.   On: 08/25/2019 10:38   DG Pelvis 1-2 Views  Result Date: 08/25/2019 CLINICAL DATA:  Right hip pain after fall. EXAM: PELVIS - 1-2 VIEW COMPARISON:  None. FINDINGS: Postsurgical changes are again noted involving the proximal right femur and intertrochanteric region. There is noted severe lytic lesion involving the intertrochanteric region of the proximal right femur consistent with malignancy or metastatic disease. Degenerative changes are seen involving the pubic symphysis. Left hip and bilateral sacroiliac joints are unremarkable. No definite  evidence of acute fracture is noted. IMPRESSION: Severe lytic lesion is noted involving the intertrochanteric region of the proximal right femur consistent with malignancy or metastatic disease. No acute fracture or dislocation is noted. Electronically Signed   By: Marijo Conception M.D.   On: 08/25/2019 10:43   CT Head Wo Contrast  Result Date: 08/25/2019 CLINICAL DATA:  [place] and hit head 3 days ago. Persistent headache. EXAM: CT HEAD WITHOUT CONTRAST TECHNIQUE: Contiguous axial images were obtained from the base of the skull through the vertex without intravenous contrast. COMPARISON:  None. FINDINGS: Brain: Mild age advanced cerebral atrophy, ventriculomegaly and periventricular white matter disease. No extra-axial fluid collections are identified. No CT findings for acute hemispheric infarction or intracranial hemorrhage. No mass lesions. The brainstem and cerebellum are normal. Vascular: Minimal vascular calcifications. No aneurysm or hyperdense vessels. Skull: No skull fracture or bone lesions. Sinuses/Orbits: The paranasal sinuses and mastoid air cells are clear. The globes are intact. Other: No scalp lesions or hematoma. IMPRESSION: 1. Mild age advanced cerebral atrophy, ventriculomegaly and periventricular white matter disease. 2. No acute intracranial findings or skull fracture. Electronically Signed   By: Marijo Sanes M.D.   On: 08/25/2019 10:25   DG Femur Min 2 Views Right  Result Date: 08/25/2019 CLINICAL DATA:  Right hip pain after fall. EXAM: RIGHT FEMUR 2 VIEWS COMPARISON:  April 07, 2019. FINDINGS: Status post surgical internal fixation old proximal right femoral pathologic fracture. Intramedullary rod fixation of the right femoral shaft is noted. Status post  right total knee arthroplasty. Vascular calcifications are noted. No acute fracture or dislocation is noted. There is continued presence of destructive lesion involving greater trochanter of the proximal right femur with extension into  the intertrochanteric region and shaft. No acute fracture is noted. IMPRESSION: Postsurgical changes as described above. Continued presence of destructive lesion involving greater trochanter of proximal right femur with extension into the intertrochanteric region and shaft. Electronically Signed   By: Marijo Conception M.D.   On: 08/25/2019 10:42    Procedures .Critical Care Performed by: Sherwood Gambler, MD Authorized by: Sherwood Gambler, MD   Critical care provider statement:    Critical care time (minutes):  30   Critical care time was exclusive of:  Separately billable procedures and treating other patients   Critical care was necessary to treat or prevent imminent or life-threatening deterioration of the following conditions:  Circulatory failure and cardiac failure   Critical care was time spent personally by me on the following activities:  Discussions with consultants, evaluation of patient's response to treatment, examination of patient, ordering and performing treatments and interventions, ordering and review of laboratory studies, ordering and review of radiographic studies, pulse oximetry, re-evaluation of patient's condition, obtaining history from patient or surrogate and review of old charts   (including critical care time)  Medications Ordered in ED Medications  diltiazem (CARDIZEM) 1 mg/mL load via infusion 10 mg (10 mg Intravenous Bolus from Bag 08/25/19 1339)    And  diltiazem (CARDIZEM) 125 mg in dextrose 5% 125 mL (1 mg/mL) infusion (5 mg/hr Intravenous New Bag/Given 08/25/19 1340)  sodium chloride 0.9 % bolus 500 mL ( Intravenous Stopped 08/25/19 1105)  HYDROmorphone (DILAUDID) injection 1 mg (1 mg Intravenous Given 08/25/19 0944)  metoprolol tartrate (LOPRESSOR) injection 2.5 mg (2.5 mg Intravenous Given 08/25/19 1108)  metoprolol tartrate (LOPRESSOR) injection 5 mg (5 mg Intravenous Given 08/25/19 1240)    ED Course  I have reviewed the triage vital signs and the nursing  notes.  Pertinent labs & imaging results that were available during my care of the patient were reviewed by me and considered in my medical decision making (see chart for details).    MDM Rules/Calculators/A&P                          Patient's labs have been reviewed and are near baseline.  X-rays and CT have also been personally reviewed and are unremarkable from a new injury standpoint.  His malignancies are present but this is not unexpected.  No new fracture to his hip.  Pain is a little better.  However he is still with a significant heart rate consistent with A. fib with RVR.  I wonder if this is why he has been feeling weak to the point of having a hard time getting up and walking.  Was given 1 dose of metoprolol and has been given a second but because of this I think he will need admission for heart rate control. Will place on iv diltiazem drip after discussion with Dr. Carles Collet. Final Clinical Impression(s) / ED Diagnoses Final diagnoses:  Atrial fibrillation with RVR [facility])    Rx / DC Orders ED Discharge Orders    None       Sherwood Gambler, MD 08/25/19 1538

## 2019-08-25 NOTE — H&P (Signed)
History and Physical  Kerry Adams WYO:[id number] DOB: [birth year] DOA: 08/25/2019   PCP: Vicenta Aly, [place]   Patient coming from: Home  Chief Complaint: generalized weakness and falling   HPI:  Kerry Adams is a 64 y.o. male with medical history of persistent atrial fibrillation, hypertension, alcohol dependence, and metastatic hepatocellular carcinoma to the lung, bone, and lymph nodes. He does drink quite a bit, 12 beers and 2 shots a day.  The patient was recently started on Tecentriq/Avastin -- start cycle #1 on 08/05/2019 for his hepatocellular carcinoma.  He also finished up radiation therapy to his right hip approximately 3 weeks before that.  He says that for the past 3 to 4 weeks he has experienced increasing generalized weakness and leg weakness.  He has had multiple falls.  In the past 2 to 3 days, the patient has had numerous falls the last being on 08/23/2019.  Since his falls, the patient has had increasing pain in his right hip and increasing difficulty with ambulation and getting up out of his chair.  He has been taking tramadol around-the-clock without significant help.  He has not had any fevers, chills, headache, chest pain, shortness breath, cough, hemoptysis, nausea, vomiting, diarrhea, abdominal pain.  Because of the increasing weakness and right hip pain, he presented for further evaluation.  He denies any dysuria, hematochezia, melena, hematuria.  He is compliant with all his medications. In the emergency department, the patient was afebrile and hemodynamically stable.  He was noted to have atrial fibrillation with RVR with a heart rate 120s.  The patient was given 2 IV pushes of metoprolol, 2.5, and then 5 mg.  Subsequently, diltiazem drip was started.  Patient was admitted for further evaluation and treatment.    Assessment/Plan: Persistent atrial fibrillation -CHADSVasc--2 -Start diltiazem drip -Continue rivaroxaban -Echocardiogram -TSH  Generalized  weakness -Multifactorial including the patient's recent immunotherapy/chemotherapy, deconditioning, and atrial fibrillation with RVR -TSH -Serum D74 -Folic acid -PT evaluation  Uncontrolled pain -Patient has had increasing pain in the right hip area since his falls -08/25/2019 right hip x-ray--persistent destructive lesion in the greater trochanter of the proximal right femur with extension into the intertrochanteric region; no acute fracture -08/25/2019 CT brain--large round mass in the right lung apex presenting expansile bone lesion in the right first rib -Start IV Dilaudid and oral oxycodone as needed for pain  Hyponatremia -Check urine osmolarity -Serum osmolarity -Urine sodium  -Urine creatinine  Metastatic hepatocellular carcinoma -Patient follows Dr. Marin Olp -He is status post palliative radiation to the right femur approximately 6 weeks prior to this admission -Tecentriq/Avastin -- started cycle #1 on 08/05/2019  Essential hypertension -Continue metoprolol tartrate  Thrombocytopenia -Likely due to recent chemotherapy/immunotherapy -Monitor for signs of bleeding  Alcohol dependence -CIWA protocol     Past Medical History:  Diagnosis Date  . Abnormal EKG   . Alcohol use 02/18/2014  . Arthritis    osteo knees and hips   . Atrial fibrillation ([place])   . CAD (coronary artery disease)    WITHOUT ANGINA PECTORIS, UNSPECIFIED VESSEL OR LESION TYPE, UNSPECIFIED WHETHER NATIVE OR TRANSPLANTED HEART  . 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 Chronic liver disease   . Cirrhosis ([place])   . Closed right hip fracture ([place]) 02/18/2014  . Dizziness   . Dysrhythmia   . Elevated liver function tests   . GERD (gastroesophageal reflux disease)    treats with OTC Ranitidine  . Goals of care, counseling/discussion 06/20/2019  .  Hepatocellular carcinoma metastatic to lung ([place]) 07/08/2019  . Hepatocellular carcinoma metastatic to lymph nodes of multiple sites ([place])  07/08/2019  . Hip fracture requiring operative repair ([place]) 02/19/2014  . Hyperlipidemia   . Hypertension   . Metastasis to right adrenal gland of unknown origin ([place]) 06/20/2019  . 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 Osteoarthritis of knee, unilateral 03/01/2015  . Primary osteoarthritis of right knee 05/23/2015  . Thrombocytopenia ([place])    Past Surgical History:  Procedure Laterality Date  . CARDIOVERSION N/A 09/13/2018   Procedure: CARDIOVERSION;  Surgeon: Acie Fredrickson Wonda Cheng, MD;  Location: [facility] ENDOSCOPY;  Service: Cardiovascular;  Laterality: N/A;  . FRACTURE SURGERY     right femur  . HERNIA REPAIR     umbilical  . INTERTROCHANTERIC HIP FRACTURE SURGERY Right 02/19/2014  . INTRAMEDULLARY (IM) NAIL INTERTROCHANTERIC Right 02/19/2014   Procedure: INTRAMEDULLARY (IM) NAIL INTERTROCHANTRIC;  Surgeon: [name], MD;  Location: [place];  Service: Orthopedics;  Laterality: Right;  . SHOULDER SURGERY Right   . TOTAL KNEE ARTHROPLASTY Left 03/01/2015   Procedure: TOTAL KNEE ARTHROPLASTY;  Surgeon: [name], MD;  Location: [place];  Service: Orthopedics;  Laterality: Left;  Left knee arthroplasty  . TOTAL KNEE ARTHROPLASTY Right 05/23/2015   Procedure: TOTAL KNEE ARTHROPLASTY W/HARDWARE REMOVAL FEMUR;  Surgeon: Frederik Pear, MD;  Location: [place];  Service: Orthopedics;  Laterality: Right;   Social History:  reports that he has never smoked. He has never used smokeless tobacco. He reports current alcohol use of about 32.0 standard drinks of alcohol per week. He reports that he does not use drugs.   Family History  Problem Relation Age of Onset  . Hypertension Mother   . Stroke Father   . Healthy Brother      No Known Allergies   Prior to Admission medications   Medication Sig Start Date End Date Taking? Authorizing Provider  acetaminophen (TYLENOL) 500 MG tablet Take 500 mg by mouth every 8 (eight) hours as  needed (pain).   Yes [provider]  famotidine (PEPCID) 10 MG tablet Take 10 mg by mouth every morning.    Yes [provider]  loperamide (IMODIUM A-D) 2 MG tablet Take 1 mg by mouth at bedtime as needed for diarrhea or loose stools. Takes 1/2 tablet total of 1 mg at bedtime.   Yes [provider]  metoprolol tartrate (LOPRESSOR) 50 MG tablet Take 1 tablet (50 mg total) by mouth 2 (two) times daily. 07/01/19  Yes Camnitz, Will Hassell Done, MD  Potassium 99 MG TABS Take 99 mg by mouth daily.   Yes [provider]  rivaroxaban (XARELTO) 20 MG TABS tablet Take 1 tablet (20 mg total) by mouth daily with supper. 07/18/19  Yes Fay Records, MD  traMADol (ULTRAM) 50 MG tablet Take 1 tablet (50 mg total) by mouth every 6 (six) hours as needed. 06/20/19  Yes Volanda Napoleon, MD  trolamine salicylate (ASPERCREME) 10 % cream Apply 1 application topically as needed for muscle pain.   Yes [provider]  amoxicillin (AMOXIL) 500 MG capsule Take 2,000 mg by mouth as directed. Before dental work Patient not taking: Reported on 08/18/2019    [provider]  ondansetron (ZOFRAN) 8 MG tablet Take 1 tablet (8 mg total) by mouth 2 (two) times daily as needed (Nausea or vomiting). Patient not taking: Reported on 08/18/2019 07/26/19   Volanda Napoleon, MD  prochlorperazine (  COMPAZINE) 10 MG tablet Take 1 tablet (10 mg total) by mouth every 6 (six) hours as needed (Nausea or vomiting). Patient not taking: Reported on 08/18/2019 07/26/19   Volanda Napoleon, MD  Pseudoeph-Doxylamine-DM-APAP (NYQUIL MULTI-SYMPTOM PO) Take 1 capsule by mouth at bedtime. Patient not taking: Reported on 08/18/2019    [provider]    Review of Systems:  Constitutional:  No weight loss, night sweats, Fevers, chills, fatigue.  Head&Eyes: No headache.  No vision loss.  No eye pain or scotoma ENT:  No Difficulty swallowing,Tooth/dental problems,Sore throat,  No ear ache, post nasal  drip,  Cardio-vascular:  No chest pain, Orthopnea, PND, swelling in lower extremities,  dizziness, palpitations  GI:  No  abdominal pain, nausea, vomiting, diarrhea, loss of appetite, hematochezia, melena, heartburn, indigestion, Resp:  No shortness of breath with exertion or at rest. No cough. No coughing up of blood .No wheezing.No chest wall deformity  Skin:  no rash or lesions.  GU:  no dysuria, change in color of urine, no urgency or frequency. No flank pain.  Musculoskeletal:  Complains of right hip pain Psych:  No change in mood or affect. No depression or anxiety. Neurologic: No headache, no dysesthesia, no focal weakness, no vision loss. No syncope  Physical Exam: Vitals:   08/25/19 1100 08/25/19 1130 08/25/19 1230 08/25/19 1300  BP: (!) 130/95 (!) 125/93 (!) 122/105 (!) 120/96  Pulse: (!) 109 (!) 116    Resp: (!) 43 17 13 15   Temp:      TempSrc:      SpO2: 97% 95%    Weight:      Height:       General:  A&O x 3, NAD, nontoxic, pleasant/cooperative Head/Eye: No conjunctival hemorrhage, no icterus, [place]/AT, No nystagmus ENT:  No icterus,  No thrush, good dentition, no pharyngeal exudate Neck:  No masses, no lymphadenpathy, no bruits CV:  IRRR, no rub, no gallop, no S3 Lung:  CTAB, good air movement, no wheeze, no rhonchi Abdomen: soft/NT, +BS, nondistended, no peritoneal signs Ext: No cyanosis, No rashes, No petechiae, No lymphangitis, No edema Neuro: CNII-XII intact, strength 4/5 in bilateral upper and lower extremities, no dysmetria  Labs on Admission:  Basic Metabolic Panel: Recent Labs  Lab 08/25/19 1023  NA 129*  K 4.6  CL 99  CO2 22  GLUCOSE 108*  BUN 8  CREATININE 0.50*  CALCIUM 7.8*   Liver Function Tests: Recent Labs  Lab 08/25/19 1023  AST 72*  ALT 34  ALKPHOS 119  BILITOT 2.6*  PROT 6.0*  ALBUMIN 2.5*   No results for input(s): LIPASE, AMYLASE in the last 168 hours. No results for input(s): AMMONIA in the last 168 hours. CBC: Recent  Labs  Lab 08/25/19 1023  WBC 7.8  NEUTROABS 5.7  HGB 12.5*  HCT 39.0  MCV 100.8*  PLT 109*   Coagulation Profile: No results for input(s): INR, PROTIME in the last 168 hours. Cardiac Enzymes: No results for input(s): CKTOTAL, CKMB, CKMBINDEX, TROPONINI in the last 168 hours. BNP: Invalid input(s): POCBNP CBG: Recent Labs  Lab 08/25/19 0947  GLUCAP 106*   Urine analysis:    Component Value Date/Time   COLORURINE AMBER (A) 08/25/2019 1134   APPEARANCEUR CLEAR 08/25/2019 1134   LABSPEC 1.014 08/25/2019 1134   PHURINE 6.0 08/25/2019 1134   GLUCOSEU NEGATIVE 08/25/2019 1134   HGBUR NEGATIVE 08/25/2019 1134   BILIRUBINUR SMALL (A) 08/25/2019 1134   KETONESUR NEGATIVE 08/25/2019 1134   PROTEINUR NEGATIVE 08/25/2019 1134  UROBILINOGEN 0.2 02/18/2014 2043   NITRITE NEGATIVE 08/25/2019 1134   LEUKOCYTESUR NEGATIVE 08/25/2019 1134   Sepsis Labs: @LABRCNTIP (procalcitonin:4,lacticidven:4) )No results found for this or any previous visit (from the past 240 hour(s)).   Radiological Exams on Admission: DG Chest 1 View  Result Date: 08/25/2019 CLINICAL DATA:  Fall. EXAM: CHEST  1 VIEW COMPARISON:  February 20, 2015.  June 20, 2019. FINDINGS: Stable cardiomegaly. No pneumothorax or pleural effusion is noted. Old right rib fractures are noted. No acute pulmonary disease is noted. Large rounded mass is noted projected over right lung apex consistent with expansile bone lesion involving the right first rib as described on prior CT scan, concerning for malignancy. IMPRESSION: Large rounded mass is noted projected over right lung apex consistent with expansile bone lesion involving the right first rib as described on prior CT scan, concerning for malignancy. No acute pulmonary disease is noted. Aortic Atherosclerosis (ICD10-I70.0). Electronically Signed   By: Marijo Conception M.D.   On: 08/25/2019 10:38   DG Pelvis 1-2 Views  Result Date: 08/25/2019 CLINICAL DATA:  Right hip pain after fall.  EXAM: PELVIS - 1-2 VIEW COMPARISON:  None. FINDINGS: Postsurgical changes are again noted involving the proximal right femur and intertrochanteric region. There is noted severe lytic lesion involving the intertrochanteric region of the proximal right femur consistent with malignancy or metastatic disease. Degenerative changes are seen involving the pubic symphysis. Left hip and bilateral sacroiliac joints are unremarkable. No definite evidence of acute fracture is noted. IMPRESSION: Severe lytic lesion is noted involving the intertrochanteric region of the proximal right femur consistent with malignancy or metastatic disease. No acute fracture or dislocation is noted. Electronically Signed   By: Marijo Conception M.D.   On: 08/25/2019 10:43   CT Head Wo Contrast  Result Date: 08/25/2019 CLINICAL DATA:  [place] and hit head 3 days ago. Persistent headache. EXAM: CT HEAD WITHOUT CONTRAST TECHNIQUE: Contiguous axial images were obtained from the base of the skull through the vertex without intravenous contrast. COMPARISON:  None. FINDINGS: Brain: Mild age advanced cerebral atrophy, ventriculomegaly and periventricular white matter disease. No extra-axial fluid collections are identified. No CT findings for acute hemispheric infarction or intracranial hemorrhage. No mass lesions. The brainstem and cerebellum are normal. Vascular: Minimal vascular calcifications. No aneurysm or hyperdense vessels. Skull: No skull fracture or bone lesions. Sinuses/Orbits: The paranasal sinuses and mastoid air cells are clear. The globes are intact. Other: No scalp lesions or hematoma. IMPRESSION: 1. Mild age advanced cerebral atrophy, ventriculomegaly and periventricular white matter disease. 2. No acute intracranial findings or skull fracture. Electronically Signed   By: Marijo Sanes M.D.   On: 08/25/2019 10:25   DG Femur Min 2 Views Right  Result Date: 08/25/2019 CLINICAL DATA:  Right hip pain after fall. EXAM: RIGHT FEMUR 2 VIEWS  COMPARISON:  April 07, 2019. FINDINGS: Status post surgical internal fixation old proximal right femoral pathologic fracture. Intramedullary rod fixation of the right femoral shaft is noted. Status post right total knee arthroplasty. Vascular calcifications are noted. No acute fracture or dislocation is noted. There is continued presence of destructive lesion involving greater trochanter of the proximal right femur with extension into the intertrochanteric region and shaft. No acute fracture is noted. IMPRESSION: Postsurgical changes as described above. Continued presence of destructive lesion involving greater trochanter of proximal right femur with extension into the intertrochanteric region and shaft. Electronically Signed   By: Marijo Conception M.D.   On: 08/25/2019 10:42  EKG: Independently reviewed. Afib, nonspecific TWI    Time spent:70 minutes Code Status:   FULL Family Communication:  Spouse updated at bedside Disposition Plan: expect 1-2 day hospitalization Consults called: none DVT Prophylaxis: Xarelto  Orson Eva, DO  [facility] Pager [phone]  If 7PM-7AM, please contact night-coverage [email or web address] Password [email or web address] 08/25/2019, 1:51 PM

## 2019-08-25 NOTE — Consult Note (Signed)
Consultation Note Date: 08/25/2019   Patient Name: Kerry Adams  DOB: Mar 11, 1955  MRN: [id number]  Age / Sex: 64 y.o., male  PCP: Vicenta Aly, [place] Referring Physician: Orson Eva, MD  Reason for Consultation: Establishing goals of care  HPI/Patient Profile: 64 y.o. male  with past medical history of metastatic hepatocellular carcinoma to lung, bone, lymph nodes currently on immunotherapy and s/p XRT right hip persistent atrial fibrillation on Xarelto, hypertension, ETOH abuse (12 beers and 2 shots per day per attending) admitted on 08/25/2019 with recurrent falls and right hip pain despite around-the-clock prn Tramadol. In ED 7/1 xray negative for acute fracture but persistent destructive lesion in the greater trochanter of proximal right femur with extension into intertrochanteric region. Also afib RVR started on Cardizem gtt. Hospital admission for pain control and afib. ECHO ordered. Palliative medicine consultation for goals of care.     Clinical Assessment and Goals of Care:  I have reviewed medical records including oncology notes, discussed with care team, and met with Kerry Adams and his wife Kerry Adams) in ED to discuss goals of care. They are waiting on inpatient bed. Arturo reports relief from IV dilaudid. He is awake, alert, oriented and able to participate in discussion.   I introduced Palliative Medicine as specialized medical care for people living with serious illness. It focuses on providing relief from the symptoms and stress of a serious illness. The goal is to improve quality of life for both the patient and the family.  Prior to admission, living home with wife. Diagnosed with hepatocellular carcinoma a few months ago and followed by Dr. Marin Olp at the [facility]. He has received one cycle of immunotherapy and tolerated well. Second cycle was scheduled for tomorrow, 7/2. Right hip pain poorly  relieved by prn tramadol at home. Functionally, he has been declining with frequent falls. Wife reports excellent nutritional status.   Wife is adamant that he will return home on discharge but they will need assist. Explained pending PT evaluation and likely that recommendation will be home health services. Also that DME can be arranged, which they do already have some home DME.   Wife asks me to further explain role of palliative and difference between palliative and hospice to [place]. Education provided.   Discussed plan for admission, diagnoses, interventions, and plan of care.   I attempted to elicit values and goals of care important to the patient. Patient and wife do share that quality of life and pain management are important. They are nervous about him becoming "addicted" to pain medication. Education provided. Did share concern with ongoing cancer-related bone pain and I anticipate he will need ongoing prn's or even extended release medication in the future to appropriately manage this pain and maintain quality of life. Antwone feels XRT did not help with pain management. Reviewed xray from today with known lesion and unfortunately anticipating he will continue to have pain and limited function because of this.   Introduced Emergency planning/management officer and MOST form. Encouraged Hoy Morn and Kerry Adams to  have early discussions about his wishes, sharing that his cancer is metastatic and interventions are palliative in nature. Patient and wife confirm understanding of palliative treatment intent not curative, following conversations with Dr. Marin Olp. Also encouraged against heroic measures at EOL, sharing this would not change underlying cancer diagnosis and prolonging nature of these interventions. Wife agrees that her and Eythan need to further review and discuss this paperwork while he is of sound mind. She shares the loss of her first husband many years ago from cancer, and how difficult it was to make 'life  and death' decisions for him.   Encouraged outpatient palliative f/u either with Wadie Lessen, PMT NP at [facility] or through community palliative referral.   No decisions made today but patient/wife appreciative of information and do seem to understand of the importance of early discussions regarding his wishes. Patient and wife are hopeful for [place] to return home following admission and f/u with Dr. Marin Olp for second cycle of immunotherapy.   Questions and concerns were addressed.  Hard Choices booklet and PMT contact information given.     SUMMARY OF RECOMMENDATIONS    Good initial palliative discussion with patient and wife Kerry Adams).   Continue current plan of care, medical management, and code status.   Introduced and discussed AD packet and MOST form. Encouraged early discussions of his wishes while he is of sound mind. Wife agrees and understands importance of these discussions. No decisions made today.   Patient/wife hopeful for discharge home following hospitalization. PT evaluation pending. Likely will need home health services and DME.  Patient/wife wish to continue outpatient oncology follow-up and immunotherapy.   May benefit from palliative f/u at [facility] with NP Wadie Lessen or outpatient community palliative referral for ongoing [place] discussions.   Code Status/Advance Care Planning:  Full code  Symptom Management:   Per attending  Palliative Prophylaxis:   Bowel Regimen and Frequent Pain Assessment   Psycho-social/Spiritual:   Desire for further Chaplaincy support: yes  Additional Recommendations: Caregiving  Support/Resources, Compassionate Wean Education and Education on Hospice  Prognosis:   Unable to determine: poor long-term with metastatic hepatocellular carcinoma. Declining functional status with frequent falls.   Discharge Planning: Likely home with home health services     Primary Diagnoses: Present on Admission: . Atrial  fibrillation with RVR ([place]) . Hepatocellular carcinoma metastatic to lymph nodes of multiple sites ([place]) . Hypertension . Other persistent atrial fibrillation ([place])   I have reviewed the medical record, interviewed the patient and family, and examined the patient. The following aspects are pertinent.  Past Medical History:  Diagnosis Date  . Abnormal EKG   . Alcohol use 02/18/2014  . Arthritis    osteo knees and hips   . Atrial fibrillation ([place])   . CAD (coronary artery disease)    WITHOUT ANGINA PECTORIS, UNSPECIFIED VESSEL OR LESION TYPE, UNSPECIFIED WHETHER NATIVE OR TRANSPLANTED HEART  . 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 Chronic liver disease   . Cirrhosis ([place])   . Closed right hip fracture ([place]) 02/18/2014  . Dizziness   . Dysrhythmia   . Elevated liver function tests   . GERD (gastroesophageal reflux disease)    treats with OTC Ranitidine  . Goals of care, counseling/discussion 06/20/2019  . Hepatocellular carcinoma metastatic to lung ([place]) 07/08/2019  . Hepatocellular carcinoma metastatic to lymph nodes of multiple sites ([place]) 07/08/2019  . Hip fracture requiring operative repair ([place]) 02/19/2014  . Hyperlipidemia   . Hypertension   .  Metastasis to right adrenal gland of unknown origin ([place]) 06/20/2019  . Metastatic sarcoma to liver ([place]) 06/20/2019  . 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 Osteoarthritis of knee, unilateral 03/01/2015  . Primary osteoarthritis of right knee 05/23/2015  . Thrombocytopenia ([place])    Social History   Socioeconomic History  . Marital status: Married    Spouse name: Not on file  . Number of children: Not on file  . Years of education: Not on file  . Highest education level: Not on file  Occupational History  . Not on file  Tobacco Use  . Smoking status: Never Smoker  . Smokeless tobacco: Never Used  Vaping Use  . Vaping Use: Never used  Substance and Sexual Activity  . Alcohol use: Yes    Alcohol/week:  32.0 standard drinks    Types: 32 Cans of beer per week    Comment: 6 beers /day  . Drug use: No  . Sexual activity: Not on file  Other Topics Concern  . Not on file  Social History Narrative  . Not on file   Social Determinants of Health   Financial Resource Strain:   . Difficulty of Paying Living Expenses:   Food Insecurity:   . Worried About Charity fundraiser in the Last Year:   . Arboriculturist in the Last Year:   Transportation Needs:   . Film/video editor (Medical):   Marland Kitchen Lack of Transportation (Non-Medical):   Physical Activity:   . Days of Exercise per Week:   . Minutes of Exercise per Session:   Stress:   . Feeling of Stress :   Social Connections:   . Frequency of Communication with Friends and Family:   . Frequency of Social Gatherings with Friends and Family:   . Attends Religious Services:   . Active Member of Clubs or Organizations:   . Attends Archivist Meetings:   Marland Kitchen Marital Status:    Family History  Problem Relation Age of Onset  . Hypertension Mother   . Stroke Father   . Healthy Brother    Scheduled Meds: Continuous Infusions: . diltiazem (CARDIZEM) infusion 5 mg/hr (08/25/19 1340)   PRN Meds:. Medications Prior to Admission:  Prior to Admission medications   Medication Sig Start Date End Date Taking? Authorizing Provider  acetaminophen (TYLENOL) 500 MG tablet Take 500 mg by mouth every 8 (eight) hours as needed (pain).   Yes [provider]  famotidine (PEPCID) 10 MG tablet Take 10 mg by mouth every morning.    Yes [provider]  loperamide (IMODIUM A-D) 2 MG tablet Take 1 mg by mouth at bedtime as needed for diarrhea or loose stools. Takes 1/2 tablet total of 1 mg at bedtime.   Yes [provider]  metoprolol tartrate (LOPRESSOR) 50 MG tablet Take 1 tablet (50 mg total) by mouth 2 (two) times daily. 07/01/19  Yes Camnitz, Will Hassell Done, MD  Potassium 99 MG TABS Take 99 mg by mouth daily.   Yes [provider]  rivaroxaban (XARELTO) 20 MG TABS tablet Take 1 tablet (20 mg total) by mouth daily with supper. 07/18/19  Yes Fay Records, MD  traMADol (ULTRAM) 50 MG tablet Take 1 tablet (50 mg total) by mouth every 6 (six) hours as needed. 06/20/19  Yes Volanda Napoleon, MD  trolamine salicylate (ASPERCREME) 10 % cream Apply 1 application topically as needed for muscle pain.   Yes [provider]  amoxicillin (AMOXIL) 500 MG capsule Take 2,000 mg by mouth as directed. Before dental work Patient not taking: Reported on 08/18/2019    [provider]  ondansetron (ZOFRAN) 8 MG tablet Take 1 tablet (8 mg total) by mouth 2 (two) times daily as needed (Nausea or vomiting). Patient not taking: Reported on 08/18/2019 07/26/19   Volanda Napoleon, MD  prochlorperazine (COMPAZINE) 10 MG tablet Take 1 tablet (10 mg total) by mouth every 6 (six) hours as needed (Nausea or vomiting). Patient not taking: Reported on 08/18/2019 07/26/19   Volanda Napoleon, MD  Pseudoeph-Doxylamine-DM-APAP (NYQUIL MULTI-SYMPTOM PO) Take 1 capsule by mouth at bedtime. Patient not taking: Reported on 08/18/2019    [provider]   No Known Allergies Review of Systems  Constitutional: Positive for activity change.       Right hip pain, frequent falls   Physical Exam Vitals and nursing note reviewed.  Constitutional:      General: He is awake.  HENT:     Head: Normocephalic and atraumatic.  Cardiovascular:     Rate and Rhythm: Rhythm irregularly irregular.  Pulmonary:     Effort: No tachypnea, accessory muscle usage or respiratory distress.  Skin:    General: Skin is warm and dry.  Neurological:     Mental Status: He is alert and oriented to person, place, and time.  Psychiatric:        Mood and Affect: Mood normal.        Speech: Speech normal.        Behavior: Behavior normal.        Cognition and Memory: Cognition normal.    Vital Signs: BP 106/90   Pulse 93   Temp 98.2 F (36.8 C)  (Oral)   Resp (!) 30   Ht _0  (1.854 m)   Wt 94.9 kg   SpO2 96%   BMI 27.60 kg/m  Pain Scale: 0-10   Pain Score: 0-No pain   SpO2: SpO2: 96 % O2 Device:SpO2: 96 % O2 Flow Rate: .   IO: Intake/output summary:   Intake/Output Summary (Last 24 hours) at 08/25/2019 1553 Last data filed at 08/25/2019 1108 Gross per 24 hour  Intake 500.27 ml  Output --  Net 500.27 ml    LBM:   Baseline Weight: Weight: 94.9 kg Most recent weight: Weight: 94.9 kg     Palliative Assessment/Data: PPS 50%   Flowsheet Rows     Most Recent Value  Intake Tab  Referral Department Hospitalist  Unit at Time of Referral ER  Palliative Care Primary Diagnosis Cancer  Palliative Care Type New Palliative care  Reason for referral Clarify Goals of Care  Date first seen by Palliative Care 08/25/19  Clinical Assessment  Palliative Performance Scale Score 50%  Psychosocial & Spiritual Assessment  Palliative Care Outcomes  Patient/Family meeting held? Yes  Who was at the meeting? patient and wife  Palliative Care Outcomes Clarified goals of care, Provided psychosocial or spiritual support, ACP counseling assistance      Time In: 7253 Time Out: 1600 Time Total: 75 Greater than 50%  of this time was spent counseling and coordinating care related to the above assessment and plan.  Signed by:  Ihor Dow, DNP, FNP-C Palliative Medicine Team  Phone: [phone] Fax: [phone]   Please contact Palliative Medicine Team phone at [phone] for questions and concerns.  For individual provider: See Shea Evans

## 2019-08-25 NOTE — ED Triage Notes (Signed)
Pt was diagnosed with liver cancer 6 months ago. Has been having trouble since then. He has been having increasing falls. Fell Monday night with right hip pain. Previous fracture in this hip. No deformities. Edema in both legs that has been progressive for 6 months. Denies pain, but pressure. With fall on Monday night, he did hit bilateral elbows and heads.

## 2019-08-25 NOTE — Progress Notes (Signed)
Received call from patient's wife, Arbie Cookey. She states patient fell on Sunday and then again on Monday. Since the fall on Monday he's been unable to get out of his chair. She believes he may have injured or fractured his hip. She has called EMS and they are transporting patient to [facility].   Will follow patient ED course. Patient and wife would benefit from additional equipment and support in the home once discharged. We can see if patient, if admitted can be set up through case management.   Dr Marin Olp updated to above events.

## 2019-08-26 ENCOUNTER — Observation Stay (HOSPITAL_BASED_OUTPATIENT_CLINIC_OR_DEPARTMENT_OTHER): Payer: Medicare Other

## 2019-08-26 ENCOUNTER — Inpatient Hospital Stay: Payer: Medicare Other | Admitting: Hematology & Oncology

## 2019-08-26 ENCOUNTER — Inpatient Hospital Stay: Payer: Medicare Other

## 2019-08-26 ENCOUNTER — Other Ambulatory Visit: Payer: Medicare Other

## 2019-08-26 DIAGNOSIS — F10139 Alcohol abuse with withdrawal, unspecified: Secondary | ICD-10-CM | POA: Diagnosis not present

## 2019-08-26 DIAGNOSIS — Z96653 Presence of artificial knee joint, bilateral: Secondary | ICD-10-CM | POA: Diagnosis present

## 2019-08-26 DIAGNOSIS — Z66 Do not resuscitate: Secondary | ICD-10-CM | POA: Diagnosis not present

## 2019-08-26 DIAGNOSIS — I4819 Other persistent atrial fibrillation: Secondary | ICD-10-CM | POA: Diagnosis not present

## 2019-08-26 DIAGNOSIS — C220 Liver cell carcinoma: Secondary | ICD-10-CM | POA: Diagnosis present

## 2019-08-26 DIAGNOSIS — I340 Nonrheumatic mitral (valve) insufficiency: Secondary | ICD-10-CM

## 2019-08-26 DIAGNOSIS — R296 Repeated falls: Secondary | ICD-10-CM | POA: Diagnosis present

## 2019-08-26 DIAGNOSIS — C7801 Secondary malignant neoplasm of right lung: Secondary | ICD-10-CM | POA: Diagnosis present

## 2019-08-26 DIAGNOSIS — E222 Syndrome of inappropriate secretion of antidiuretic hormone: Secondary | ICD-10-CM | POA: Diagnosis present

## 2019-08-26 DIAGNOSIS — Y92009 Unspecified place in unspecified non-institutional (private) residence as the place of occurrence of the external cause: Secondary | ICD-10-CM | POA: Diagnosis not present

## 2019-08-26 DIAGNOSIS — E861 Hypovolemia: Secondary | ICD-10-CM | POA: Diagnosis present

## 2019-08-26 DIAGNOSIS — T451X5A Adverse effect of antineoplastic and immunosuppressive drugs, initial encounter: Secondary | ICD-10-CM | POA: Diagnosis present

## 2019-08-26 DIAGNOSIS — I4821 Permanent atrial fibrillation: Secondary | ICD-10-CM | POA: Diagnosis present

## 2019-08-26 DIAGNOSIS — D6959 Other secondary thrombocytopenia: Secondary | ICD-10-CM | POA: Diagnosis present

## 2019-08-26 DIAGNOSIS — K219 Gastro-esophageal reflux disease without esophagitis: Secondary | ICD-10-CM | POA: Diagnosis present

## 2019-08-26 DIAGNOSIS — C778 Secondary and unspecified malignant neoplasm of lymph nodes of multiple regions: Secondary | ICD-10-CM | POA: Diagnosis present

## 2019-08-26 DIAGNOSIS — K7460 Unspecified cirrhosis of liver: Secondary | ICD-10-CM | POA: Diagnosis present

## 2019-08-26 DIAGNOSIS — I4891 Unspecified atrial fibrillation: Secondary | ICD-10-CM | POA: Diagnosis not present

## 2019-08-26 DIAGNOSIS — I2510 Atherosclerotic heart disease of native coronary artery without angina pectoris: Secondary | ICD-10-CM | POA: Diagnosis present

## 2019-08-26 DIAGNOSIS — Z20822 Contact with and (suspected) exposure to covid-19: Secondary | ICD-10-CM | POA: Diagnosis present

## 2019-08-26 DIAGNOSIS — M25551 Pain in right hip: Secondary | ICD-10-CM | POA: Diagnosis present

## 2019-08-26 DIAGNOSIS — E785 Hyperlipidemia, unspecified: Secondary | ICD-10-CM | POA: Diagnosis present

## 2019-08-26 DIAGNOSIS — C4021 Malignant neoplasm of long bones of right lower limb: Secondary | ICD-10-CM | POA: Diagnosis present

## 2019-08-26 DIAGNOSIS — Z7189 Other specified counseling: Secondary | ICD-10-CM | POA: Diagnosis not present

## 2019-08-26 DIAGNOSIS — W1839XA Other fall on same level, initial encounter: Secondary | ICD-10-CM | POA: Diagnosis present

## 2019-08-26 DIAGNOSIS — Z923 Personal history of irradiation: Secondary | ICD-10-CM | POA: Diagnosis not present

## 2019-08-26 DIAGNOSIS — F10231 Alcohol dependence with withdrawal delirium: Secondary | ICD-10-CM | POA: Diagnosis present

## 2019-08-26 DIAGNOSIS — R441 Visual hallucinations: Secondary | ICD-10-CM | POA: Diagnosis present

## 2019-08-26 DIAGNOSIS — I350 Nonrheumatic aortic (valve) stenosis: Secondary | ICD-10-CM

## 2019-08-26 DIAGNOSIS — I10 Essential (primary) hypertension: Secondary | ICD-10-CM | POA: Diagnosis present

## 2019-08-26 DIAGNOSIS — R531 Weakness: Secondary | ICD-10-CM | POA: Diagnosis present

## 2019-08-26 DIAGNOSIS — C7971 Secondary malignant neoplasm of right adrenal gland: Secondary | ICD-10-CM | POA: Diagnosis present

## 2019-08-26 LAB — CBC
HCT: 37.1 % — ABNORMAL LOW (ref 39.0–52.0)
Hemoglobin: 11.7 g/dL — ABNORMAL LOW (ref 13.0–17.0)
MCH: 32.4 pg (ref 26.0–34.0)
MCHC: 31.5 g/dL (ref 30.0–36.0)
MCV: 102.8 fL — ABNORMAL HIGH (ref 80.0–100.0)
Platelets: 114 10*3/uL — ABNORMAL LOW (ref 150–400)
RBC: 3.61 MIL/uL — ABNORMAL LOW (ref 4.22–5.81)
RDW: 19.1 % — ABNORMAL HIGH (ref 11.5–15.5)
WBC: 5.7 10*3/uL (ref 4.0–10.5)
nRBC: 0 % (ref 0.0–0.2)

## 2019-08-26 LAB — ECHOCARDIOGRAM COMPLETE
Height: 73 in
Weight: 3273.39 oz

## 2019-08-26 LAB — COMPREHENSIVE METABOLIC PANEL
ALT: 32 U/L (ref 0–44)
AST: 70 U/L — ABNORMAL HIGH (ref 15–41)
Albumin: 2.2 g/dL — ABNORMAL LOW (ref 3.5–5.0)
Alkaline Phosphatase: 108 U/L (ref 38–126)
Anion gap: 8 (ref 5–15)
BUN: 11 mg/dL (ref 8–23)
CO2: 24 mmol/L (ref 22–32)
Calcium: 7.7 mg/dL — ABNORMAL LOW (ref 8.9–10.3)
Chloride: 99 mmol/L (ref 98–111)
Creatinine, Ser: 0.46 mg/dL — ABNORMAL LOW (ref 0.61–1.24)
GFR calc Af Amer: >60 mL/min (ref >60)
GFR calc non Af Amer: >60 mL/min (ref >60)
Glucose, Bld: 100 mg/dL — ABNORMAL HIGH (ref 70–99)
Potassium: 4.6 mmol/L (ref 3.5–5.1)
Sodium: 131 mmol/L — ABNORMAL LOW (ref 135–145)
Total Bilirubin: 2.7 mg/dL — ABNORMAL HIGH (ref 0.3–1.2)
Total Protein: 5.2 g/dL — ABNORMAL LOW (ref 6.5–8.1)

## 2019-08-26 LAB — OSMOLALITY, URINE: Osmolality, Ur: 551 mOsm/kg (ref 300–900)

## 2019-08-26 LAB — HIV ANTIBODY (ROUTINE TESTING W REFLEX): HIV Screen 4th Generation wRfx: NONREACTIVE

## 2019-08-26 LAB — MAGNESIUM: Magnesium: 1.6 mg/dL — ABNORMAL LOW (ref 1.7–2.4)

## 2019-08-26 MED ORDER — MAGNESIUM SULFATE 2 GM/50ML IV SOLN
2.0000 g | Freq: Once | INTRAVENOUS | Status: AC
  Administered 2019-08-26: 2 g via INTRAVENOUS
  Filled 2019-08-26: qty 50

## 2019-08-26 MED ORDER — DILTIAZEM HCL 30 MG PO TABS
30.0000 mg | ORAL_TABLET | Freq: Four times a day (QID) | ORAL | Status: DC
  Administered 2019-08-26 (×3): 30 mg via ORAL
  Filled 2019-08-26 (×3): qty 1

## 2019-08-26 MED ORDER — OXYBUTYNIN CHLORIDE 5 MG PO TABS
5.0000 mg | ORAL_TABLET | Freq: Three times a day (TID) | ORAL | Status: DC
  Administered 2019-08-26 – 2019-09-02 (×19): 5 mg via ORAL
  Filled 2019-08-26 (×20): qty 1

## 2019-08-26 NOTE — Plan of Care (Signed)
  Problem: Acute Rehab PT Goals(only PT should resolve) Goal: Pt Will Go Supine/Side To Sit Outcome: Progressing Flowsheets (Taken 08/26/2019 0902) Pt will go Supine/Side to Sit:  with modified independence  with supervision Goal: Patient Will Transfer Sit To/From Stand Outcome: Progressing Flowsheets (Taken 08/26/2019 0902) Patient will transfer sit to/from stand: with supervision Goal: Pt Will Transfer Bed To Chair/Chair To Bed Outcome: Progressing Flowsheets (Taken 08/26/2019 0902) Pt will Transfer Bed to Chair/Chair to Bed:  with supervision  min guard assist Goal: Pt Will Ambulate Outcome: Progressing Flowsheets (Taken 08/26/2019 0902) Pt will Ambulate:  25 feet  with minimal assist  with min guard assist  with rolling walker   9:02 AM, 08/26/19 Lonell Grandchild, MPT Physical Therapist with [facility] [phone] office [phone] mobile phone

## 2019-08-26 NOTE — TOC Initial Note (Addendum)
Transition of Care [facility]) - Initial/Assessment Note    Patient Details  Name: Kerry Adams MRN: [id number] Date of Birth: 22-Apr-1955  [facility] CM/SW Contact:    Salome Arnt, LCSW Phone Number: 08/26/2019, 2:22 PM  Clinical Narrative:  LCSW met with pt and pt's wife at bedside. Pt admitted due to atrial fibrillation. PT evaluated pt and recommend SNF. LCSW discussed placement process and provided options. Pt and wife discussed and notified LCSW that they would prefer home health. Orders entered for PT, RN, SW, and aide. Choice given and referred to [facility]. Pt's wife reports they have long term care policy and will be able to get private duty care if needed. They also request hospital bed. Referred to [facility]. LCSW also received referral for substance abuse. Pt admits to drinking 12 beers and 2 shots a day. He states he has been drinking for many years and has not had a period of sobriety. Pt does not feel his alcohol use is a problem for him, but states his wife feels it is. He is thinking about stopping and feels he can do this on his own. LCSW encouraged pt to look in to outpatient resources and provided list to pt.                  Expected Discharge Plan: [place] Barriers to Discharge: Continued Medical Work up   Patient Goals and CMS Choice Patient states their goals for this hospitalization and ongoing recovery are:: return home CMS Medicare.gov Compare Post Acute Care list provided to:: Patient Choice offered to / list presented to : Patient  Expected Discharge Plan and Services Expected Discharge Plan: [place] In-house Referral: Clinical Social Work   Post Acute Care Choice: [place] arrangements for the past 2 months: [place]: RN, PT, Nurse's Aide [place] Agency: [place] ([facility]) Date HH Agency Contacted: 08/26/19 Time [place]: [place] Representative spoke with at [place]: [place] Arrangements/Services Living arrangements for the past 2 months: [place] Lives with:: Spouse Patient language and need for interpreter reviewed:: Yes Do you feel safe going back to the place where you live?: Yes      Need for Family Participation in Patient Care: Yes (Comment) Care giver support system in place?: Yes (comment) Current home services: DME (cane, walker, 3N1, shower chair) Criminal Activity/Legal Involvement Pertinent to Current Situation/Hospitalization: No - Comment as needed  Activities of Daily Living Home Assistive Devices/Equipment: Walker (specify type) ADL Screening (condition at time of admission) Patient's cognitive ability adequate to safely complete daily activities?: Yes Is the patient deaf or have difficulty hearing?: No Does the patient have difficulty seeing, even when wearing glasses/contacts?: No Does the patient have difficulty concentrating, remembering, or making decisions?: No Patient able to express need for assistance with ADLs?: Yes Does the patient have difficulty dressing or bathing?: No Independently performs ADLs?: Yes (appropriate for developmental age) Does the patient have difficulty walking or climbing stairs?: No Weakness of Legs: None Weakness of Arms/Hands: None  Permission Sought/Granted                  Emotional Assessment Appearance:: Appears stated age Attitude/Demeanor/Rapport:  (Appropriate) Affect (typically observed): Appropriate Orientation: : Oriented to Self, Oriented  to Place, Oriented to  Time, Oriented to Situation Alcohol / Substance Use: Alcohol Use Psych Involvement: No (comment)  Admission diagnosis:  A-fib ([place]) [I48.91] Atrial fibrillation with RVR (HCC) [I48.91] Atrial fibrillation, unspecified type [facility]) [I48.91] Patient Active Problem List   Diagnosis Date Noted  . Atrial fibrillation with RVR ([place])  08/25/2019  . Weakness   . Palliative care by specialist   . Hepatocellular carcinoma metastatic to lung ([place]) 07/08/2019  . Hepatocellular carcinoma metastatic to lymph nodes of multiple sites ([place]) 07/08/2019  . Hepatocellular carcinoma metastatic to bone ([place]) 07/07/2019  . Goals of care, counseling/discussion 06/20/2019  . Other persistent atrial fibrillation ([place])   . Primary osteoarthritis of right knee 05/23/2015  . Osteoarthritis of knee, unilateral 03/01/2015  . Hip fracture requiring operative repair ([place]) 02/19/2014  . Closed right hip fracture ([place]) 02/18/2014  . Hypertension 02/18/2014  . Chronic liver disease 02/18/2014  . Alcohol use 02/18/2014   PCP:  Vicenta Aly, [place] Pharmacy:   [facility] [address], [place] [address] [place] [address] Phone: [phone] Fax: [phone]     Social Determinants of Health (SDOH) Interventions    Readmission Risk Interventions No flowsheet data found.

## 2019-08-26 NOTE — Progress Notes (Signed)
PROGRESS NOTE  Kerry Adams SMO:[id number] DOB: Jun 23, 1955 DOA: 08/25/2019 PCP: Vicenta Aly, FNP  Brief History:  64 y.o. male with medical history of persistent atrial fibrillation, hypertension, alcohol dependence, and metastatic hepatocellular carcinoma to the lung, bone, and lymph nodes. He does drink quite a bit, 12 beers and 2 shots a day.  The patient was recently started on Tecentriq/Avastin -- start cycle #1 on 08/05/2019 for his hepatocellular carcinoma.  He also finished up radiation therapy to his right hip approximately 3 weeks before that.  He says that for the past 3 to 4 weeks he has experienced increasing generalized weakness and leg weakness.  He has had multiple falls.  In the past 2 to 3 days, the patient has had numerous falls the last being on 08/23/2019.  Since his falls, the patient has had increasing pain in his right hip and increasing difficulty with ambulation and getting up out of his chair.  He has been taking tramadol around-the-clock without significant help.  He has not had any fevers, chills, headache, chest pain, shortness breath, cough, hemoptysis, nausea, vomiting, diarrhea, abdominal pain.  Because of the increasing weakness and right hip pain, he presented for further evaluation.  He denies any dysuria, hematochezia, melena, hematuria.  He is compliant with all his medications. In the emergency department, the patient was afebrile and hemodynamically stable.  He was noted to have atrial fibrillation with RVR with a heart rate 120s.  The patient was given 2 IV pushes of metoprolol, 2.5, and then 5 mg.  Subsequently, diltiazem drip was started.  Patient was admitted for further evaluation and treatment.  Assessment/Plan:  Persistent atrial fibrillation with RVR -CHADSVasc--2 -Start diltiazem drip>>po diltiazem -Continue rivaroxaban -Echocardiogram -TSH--3.265  Generalized weakness -Multifactorial including the patient's recent  immunotherapy/chemotherapy, deconditioning, and atrial fibrillation with RVR -TSH--3.265 -Serum B20--100 -Folic FHQR--9.7 -PT evaluation-->SNF  Uncontrolled pain -Patient has had increasing pain in the right hip area since his falls -08/25/2019 right hip x-ray--persistent destructive lesion in the greater trochanter of the proximal right femur with extension into the intertrochanteric region; no acute fracture -08/25/2019 CT brain--large round mass in the right lung apex presenting expansile bone lesion in the right first rib -Continue IV Dilaudid and oral oxycodone as needed for pain  Hyponatremia -due to poor solute intake and degree of SIADH -FeNa 0.24% -continue IVF  Metastatic hepatocellular carcinoma -Patient follows Dr. Marin Olp -He is status post palliative radiation to the right femur approximately 6 weeks prior to this admission -Tecentriq/Avastin -- started cycle #1 on 08/05/2019  Essential hypertension -Continue metoprolol tartrate  Thrombocytopenia -Likely due to recent chemotherapy/immunotherapy -Monitor for signs of bleeding  Alcohol dependence -CIWA protocol  Hypomagnesemia -replete     Status is: Observation  Patient will need inpatient due to unsafe d/c home with need for SNF--awaiting insurance authorization.  Continued IVF for hyponatremia  Dispo: The patient is from: Home              Anticipated d/c is to: SNF              Anticipated d/c date is: 1 day              Patient currently is not medically stable to d/c.        Family Communication:   Spouse updated at bedside 7/2  Consultants:  palliative  Code Status:  FULL   DVT Prophylaxis: xarelto   Procedures: As Listed in Progress Note  Above  Antibiotics: None      Subjective: Pt still complains of weakness and pain although pain is improving.  Patient denies fevers, chills, headache, chest pain, dyspnea, nausea, vomiting, diarrhea, abdominal pain, dysuria, hematuria,  hematochezia, and melena.   Objective: Vitals:   08/26/19 0445 08/26/19 0500 08/26/19 0515 08/26/19 0800  BP: 111/77 110/71 113/80   Pulse: 84 88 67   Resp: (!) 21 19 (!) 23   Temp:    97.8 F (36.6 C)  TempSrc:    Oral  SpO2: 97% 97% 98%   Weight:  92.8 kg    Height:        Intake/Output Summary (Last 24 hours) at 08/26/2019 1017 Last data filed at 08/26/2019 0530 Gross per 24 hour  Intake 1509.31 ml  Output 700 ml  Net 809.31 ml   Weight change:  Exam:   General:  Pt is alert, follows commands appropriately, not in acute distress  HEENT: No icterus, No thrush, No neck mass, [place]/AT  Cardiovascular: IRRR, S1/S2, no rubs, no gallops  Respiratory: CTA bilaterally, no wheezing, no crackles, no rhonchi  Abdomen: Soft/+BS, non tender, non distended, no guarding  Extremities: No edema, No lymphangitis, No petechiae, No rashes, no synovitis   Data Reviewed: I have personally reviewed following labs and imaging studies Basic Metabolic Panel: Recent Labs  Lab 08/25/19 1023 08/26/19 0433  NA 129* 131*  K 4.6 4.6  CL 99 99  CO2 22 24  GLUCOSE 108* 100*  BUN 8 11  CREATININE 0.50* 0.46*  CALCIUM 7.8* 7.7*  MG  --  1.6*  PHOS 2.9  --    Liver Function Tests: Recent Labs  Lab 08/25/19 1023 08/26/19 0433  AST 72* 70*  ALT 34 32  ALKPHOS 119 108  BILITOT 2.6* 2.7*  PROT 6.0* 5.2*  ALBUMIN 2.5* 2.2*   No results for input(s): LIPASE, AMYLASE in the last 168 hours. No results for input(s): AMMONIA in the last 168 hours. Coagulation Profile: No results for input(s): INR, PROTIME in the last 168 hours. CBC: Recent Labs  Lab 08/25/19 1023 08/26/19 0433  WBC 7.8 5.7  NEUTROABS 5.7  --   HGB 12.5* 11.7*  HCT 39.0 37.1*  MCV 100.8* 102.8*  PLT 109* 114*   Cardiac Enzymes: No results for input(s): CKTOTAL, CKMB, CKMBINDEX, TROPONINI in the last 168 hours. BNP: Invalid input(s): POCBNP CBG: Recent Labs  Lab 08/25/19 0947  GLUCAP 106*   HbA1C: No  results for input(s): HGBA1C in the last 72 hours. Urine analysis:    Component Value Date/Time   COLORURINE AMBER (A) 08/25/2019 1134   APPEARANCEUR CLEAR 08/25/2019 1134   LABSPEC 1.014 08/25/2019 1134   PHURINE 6.0 08/25/2019 1134   GLUCOSEU NEGATIVE 08/25/2019 1134   HGBUR NEGATIVE 08/25/2019 1134   BILIRUBINUR SMALL (A) 08/25/2019 1134   KETONESUR NEGATIVE 08/25/2019 1134   PROTEINUR NEGATIVE 08/25/2019 1134   UROBILINOGEN 0.2 02/18/2014 2043   NITRITE NEGATIVE 08/25/2019 1134   LEUKOCYTESUR NEGATIVE 08/25/2019 1134   Sepsis Labs: @LABRCNTIP (procalcitonin:4,lacticidven:4) ) Recent Results (from the past 240 hour(s))  SARS Coronavirus 2 by RT PCR (hospital order, performed in [address] hospital lab) Nasopharyngeal Nasopharyngeal Swab     Status: None   Collection Time: 08/25/19  2:35 PM   Specimen: Nasopharyngeal Swab  Result Value Ref Range Status   SARS Coronavirus 2 NEGATIVE NEGATIVE Final    Comment: (NOTE) SARS-CoV-2 target nucleic acids are NOT DETECTED.  The SARS-CoV-2 RNA is generally detectable in upper and  lower respiratory specimens during the acute phase of infection. The lowest concentration of SARS-CoV-2 viral copies this assay can detect is 250 copies / mL. A negative result does not preclude SARS-CoV-2 infection and should not be used as the sole basis for treatment or other patient management decisions.  A negative result may occur with improper specimen collection / handling, submission of specimen other than nasopharyngeal swab, presence of viral mutation(s) within the areas targeted by this assay, and inadequate number of viral copies (<250 copies / mL). A negative result must be combined with clinical observations, patient history, and epidemiological information.  Fact Sheet for Patients:   StrictlyIdeas.no  Fact Sheet for Healthcare Providers: BankingDealers.co.za  This test is not yet approved  or  cleared by the [place] FDA and has been authorized for detection and/or diagnosis of SARS-CoV-2 by FDA under an Emergency Use Authorization (EUA).  This EUA will remain in effect (meaning this test can be used) for the duration of the COVID-19 declaration under Section 564(b)(1) of the Act, 21 U.S.C. section 360bbb-3(b)(1), unless the authorization is terminated or revoked sooner.  Performed at [facility], [address], [place], [place] [address]   MRSA PCR Screening     Status: None   Collection Time: 08/25/19  4:40 PM   Specimen: Nasopharyngeal  Result Value Ref Range Status   MRSA by PCR NEGATIVE NEGATIVE Final    Comment:        The GeneXpert MRSA Assay (FDA approved for NASAL specimens only), is one component of a comprehensive MRSA colonization surveillance program. It is not intended to diagnose MRSA infection nor to guide or monitor treatment for MRSA infections. Performed at [facility], [address] [place],  [address]      Scheduled Meds: . Chlorhexidine Gluconate Cloth  6 each Topical Daily  . famotidine  10 mg Oral BH-[id number]  . folic acid  1 mg Oral Daily  . metoprolol tartrate  50 mg Oral BID  . multivitamin with minerals  1 tablet Oral Daily  . polyethylene glycol  17 g Oral Daily  . rivaroxaban  20 mg Oral Q supper  . senna  2 tablet Oral Daily  . thiamine  100 mg Oral Daily   Or  . thiamine  100 mg Intravenous Daily   Continuous Infusions: . sodium chloride 75 mL/hr at 08/26/19 0810  . diltiazem (CARDIZEM) infusion 10 mg/hr (08/26/19 0530)    Procedures/Studies: DG Chest 1 View  Result Date: 08/25/2019 CLINICAL DATA:  Fall. EXAM: CHEST  1 VIEW COMPARISON:  February 20, 2015.  June 20, 2019. FINDINGS: Stable cardiomegaly. No pneumothorax or pleural effusion is noted. Old right rib fractures are noted. No acute pulmonary disease is noted. Large rounded mass is noted projected over right lung apex consistent with expansile bone  lesion involving the right first rib as described on prior CT scan, concerning for malignancy. IMPRESSION: Large rounded mass is noted projected over right lung apex consistent with expansile bone lesion involving the right first rib as described on prior CT scan, concerning for malignancy. No acute pulmonary disease is noted. Aortic Atherosclerosis (ICD10-I70.0). Electronically Signed   By: Marijo Conception M.D.   On: 08/25/2019 10:38   DG Pelvis 1-2 Views  Result Date: 08/25/2019 CLINICAL DATA:  Right hip pain after fall. EXAM: PELVIS - 1-2 VIEW COMPARISON:  None. FINDINGS: Postsurgical changes are again noted involving the proximal right femur and intertrochanteric region. There is noted severe lytic lesion involving the  intertrochanteric region of the proximal right femur consistent with malignancy or metastatic disease. Degenerative changes are seen involving the pubic symphysis. Left hip and bilateral sacroiliac joints are unremarkable. No definite evidence of acute fracture is noted. IMPRESSION: Severe lytic lesion is noted involving the intertrochanteric region of the proximal right femur consistent with malignancy or metastatic disease. No acute fracture or dislocation is noted. Electronically Signed   By: Marijo Conception M.D.   On: 08/25/2019 10:43   CT Head Wo Contrast  Result Date: 08/25/2019 CLINICAL DATA:  [place] and hit head 3 days ago. Persistent headache. EXAM: CT HEAD WITHOUT CONTRAST TECHNIQUE: Contiguous axial images were obtained from the base of the skull through the vertex without intravenous contrast. COMPARISON:  None. FINDINGS: Brain: Mild age advanced cerebral atrophy, ventriculomegaly and periventricular white matter disease. No extra-axial fluid collections are identified. No CT findings for acute hemispheric infarction or intracranial hemorrhage. No mass lesions. The brainstem and cerebellum are normal. Vascular: Minimal vascular calcifications. No aneurysm or hyperdense vessels.  Skull: No skull fracture or bone lesions. Sinuses/Orbits: The paranasal sinuses and mastoid air cells are clear. The globes are intact. Other: No scalp lesions or hematoma. IMPRESSION: 1. Mild age advanced cerebral atrophy, ventriculomegaly and periventricular white matter disease. 2. No acute intracranial findings or skull fracture. Electronically Signed   By: Marijo Sanes M.D.   On: 08/25/2019 10:25   US Venous Img Lower Bilateral (DVT)  Result Date: 08/18/2019 CLINICAL DATA:  Bilateral lower extremity pain and edema. History of malignancy. Evaluate for DVT. EXAM: BILATERAL LOWER EXTREMITY VENOUS DOPPLER ULTRASOUND TECHNIQUE: Gray-scale sonography with graded compression, as well as color Doppler and duplex ultrasound were performed to evaluate the lower extremity deep venous systems from the level of the common femoral vein and including the common femoral, femoral, profunda femoral, popliteal and calf veins including the posterior tibial, peroneal and gastrocnemius veins when visible. The superficial great saphenous vein was also interrogated. Spectral Doppler was utilized to evaluate flow at rest and with distal augmentation maneuvers in the common femoral, femoral and popliteal veins. COMPARISON:  None. FINDINGS: RIGHT LOWER EXTREMITY Common Femoral Vein: No evidence of thrombus. Normal compressibility, respiratory phasicity and response to augmentation. Saphenofemoral Junction: No evidence of thrombus. Normal compressibility and flow on color Doppler imaging. Profunda Femoral Vein: No evidence of thrombus. Normal compressibility and flow on color Doppler imaging. Femoral Vein: No evidence of thrombus. Normal compressibility, respiratory phasicity and response to augmentation. Popliteal Vein: No evidence of thrombus. Normal compressibility, respiratory phasicity and response to augmentation. Calf Veins: No evidence of thrombus. Normal compressibility and flow on color Doppler imaging. Superficial Great  Saphenous Vein: No evidence of thrombus. Normal compressibility. Venous Reflux:  None. Other Findings:  None. LEFT LOWER EXTREMITY Common Femoral Vein: No evidence of thrombus. Normal compressibility, respiratory phasicity and response to augmentation. Saphenofemoral Junction: No evidence of thrombus. Normal compressibility and flow on color Doppler imaging. Profunda Femoral Vein: No evidence of thrombus. Normal compressibility and flow on color Doppler imaging. Femoral Vein: No evidence of thrombus. Normal compressibility, respiratory phasicity and response to augmentation. Popliteal Vein: No evidence of thrombus. Normal compressibility, respiratory phasicity and response to augmentation. Calf Veins: No evidence of thrombus. Normal compressibility and flow on color Doppler imaging. Superficial Great Saphenous Vein: No evidence of thrombus. Normal compressibility. Venous Reflux:  None. Other Findings:  None. IMPRESSION: No evidence of DVT within either lower extremity. Electronically Signed   By: Sandi Mariscal M.D.   On: 08/18/2019 16:24  DG Femur Min 2 Views Right  Result Date: 08/25/2019 CLINICAL DATA:  Right hip pain after fall. EXAM: RIGHT FEMUR 2 VIEWS COMPARISON:  April 07, 2019. FINDINGS: Status post surgical internal fixation old proximal right femoral pathologic fracture. Intramedullary rod fixation of the right femoral shaft is noted. Status post right total knee arthroplasty. Vascular calcifications are noted. No acute fracture or dislocation is noted. There is continued presence of destructive lesion involving greater trochanter of the proximal right femur with extension into the intertrochanteric region and shaft. No acute fracture is noted. IMPRESSION: Postsurgical changes as described above. Continued presence of destructive lesion involving greater trochanter of proximal right femur with extension into the intertrochanteric region and shaft. Electronically Signed   By: Marijo Conception M.D.   On:  08/25/2019 10:42    Orson Eva, DO  [facility]  If 7PM-7AM, please contact night-coverage [email or web address] Password [email or web address] 08/26/2019, 10:17 AM   LOS: 0 days

## 2019-08-26 NOTE — TOC Progression Note (Signed)
Patient requires frequent re-positioning of the body in ways that cannot be achieved with  an ordinary bed or wedge pillow, to eliminate pain, reduce pressure, and the head of the  bed to be elevated more than 30 degrees most of the time due to weakness and metastatic hepatocellular carcinoma.

## 2019-08-26 NOTE — Evaluation (Signed)
Physical Therapy Evaluation Patient Details Name: Kerry Adams MRN: [id number] DOB: 1955/06/21 Today's Date: 08/26/2019   History of Present Illness  Kerry Adams is a 64 y.o. male with medical history of persistent atrial fibrillation, hypertension, alcohol dependence, and metastatic hepatocellular carcinoma to the lung, bone, and lymph nodes. He does drink quite a bit, 12 beers and 2 shots a day.  The patient was recently started on Tecentriq/Avastin -- start cycle #1 on 08/05/2019 for his hepatocellular carcinoma.  He also finished up radiation therapy to his right hip approximately 3 weeks before that.  He says that for the past 3 to 4 weeks he has experienced increasing generalized weakness and leg weakness.  He has had multiple falls.  In the past 2 to 3 days, the patient has had numerous falls the last being on 08/23/2019.  Since his falls, the patient has had increasing pain in his right hip and increasing difficulty with ambulation and getting up out of his chair.  He has been taking tramadol around-the-clock without significant help.  He has not had any fevers, chills, headache, chest pain, shortness breath, cough, hemoptysis, nausea, vomiting, diarrhea, abdominal pain.  Because of the increasing weakness and right hip pain, he presented for further evaluation.  He denies any dysuria, hematochezia, melena, hematuria.  He is compliant with all his medications.In the emergency department, the patient was afebrile and hemodynamically stable.  He was noted to have atrial fibrillation with RVR with a heart rate 120s.  The patient was given 2 IV pushes of metoprolol, 2.5, and then 5 mg.  Subsequently, diltiazem drip was started.  Patient was admitted for further evaluation and treatment.    Clinical Impression  Patient demonstrates slow labored movement for sitting up at bedside, limited to a few unsteady shaky steps due to generalized weakness and fatigue and tolerated sitting up in chair after  therapy.  Patient will benefit from continued physical therapy in hospital and recommended venue below to increase strength, balance, endurance for safe ADLs and gait.    Follow Up Recommendations SNF;Supervision for mobility/OOB;Supervision - Intermittent    Equipment Recommendations  None recommended by PT    Recommendations for Other Services       Precautions / Restrictions Precautions Precautions: Fall Restrictions Weight Bearing Restrictions: No      Mobility  Bed Mobility Overal bed mobility: Needs Assistance Bed Mobility: Supine to Sit     Supine to sit: Min guard;Min assist     General bed mobility comments: increased time labored movement  Transfers Overall transfer level: Needs assistance   Transfers: Sit to/from Stand;Stand Pivot Transfers Sit to Stand: Min assist Stand pivot transfers: Min assist          Ambulation/Gait Ambulation/Gait assistance: Min assist Gait Distance (Feet): 5 Feet Assistive device: Rolling walker (2 wheeled) Gait Pattern/deviations: Decreased step length - right;Decreased step length - left;Decreased stride length Gait velocity: slow   General Gait Details: limited to 5-6 slow labored unsteady steady steps at bedside with shakiness of extremities  Stairs            Wheelchair Mobility    Modified Rankin (Stroke Patients Only)       Balance Overall balance assessment: Needs assistance Sitting-balance support: Feet supported;No upper extremity supported Sitting balance-Leahy Scale: Good Sitting balance - Comments: seated at EOB   Standing balance support: During functional activity;Bilateral upper extremity supported Standing balance-Leahy Scale: Fair Standing balance comment: using RW  Pertinent Vitals/Pain Pain Assessment: 0-10 Pain Score: 9  Pain Location: right hip Pain Descriptors / Indicators: Sore Pain Intervention(s): Limited activity within patient's  tolerance;Monitored during session;Repositioned;Premedicated before session    Home Living Family/patient expects to be discharged to:: Private residence Living Arrangements: Spouse/significant other Available Help at Discharge: Family;Available PRN/intermittently Type of Home: House Home Access: Stairs to enter Entrance Stairs-Rails: Right;Left;Can reach both Entrance Stairs-Number of Steps: 2 Home Layout: Two level;Able to live on main level with bedroom/bathroom Home Equipment: Gilford Rile - 2 wheels;Shower seat;Bedside commode;Wheelchair - manual      Prior Function Level of Independence: Independent with assistive device(s)         Comments: household ambulator using RW     Hand Dominance   Dominant Hand: Right    Extremity/Trunk Assessment   Upper Extremity Assessment Upper Extremity Assessment: Generalized weakness    Lower Extremity Assessment Lower Extremity Assessment: Generalized weakness    Cervical / Trunk Assessment Cervical / Trunk Assessment: Normal  Communication   Communication: No difficulties  Cognition Arousal/Alertness: Awake/alert Behavior During Therapy: WFL for tasks assessed/performed Overall Cognitive Status: Within Functional Limits for tasks assessed                                        General Comments      Exercises     Assessment/Plan    PT Assessment Patient needs continued PT services  PT Problem List Decreased strength;Decreased activity tolerance;Decreased balance;Decreased mobility       PT Treatment Interventions Gait training;Stair training;Functional mobility training;Therapeutic activities;Therapeutic exercise;Patient/family education    PT Goals (Current goals can be found in the Care Plan section)  Acute Rehab PT Goals Patient Stated Goal: return home with family to assist PT Goal Formulation: With patient Time For Goal Achievement: 09/09/19 Potential to Achieve Goals: Good    Frequency Min  3X/week   Barriers to discharge        Co-evaluation               AM-PAC PT "6 Clicks" Mobility  Outcome Measure Help needed turning from your back to your side while in a flat bed without using bedrails?: None Help needed moving from lying on your back to sitting on the side of a flat bed without using bedrails?: A Little Help needed moving to and from a bed to a chair (including a wheelchair)?: A Little Help needed standing up from a chair using your arms (e.g., wheelchair or bedside chair)?: A Little Help needed to walk in hospital room?: A Lot Help needed climbing 3-5 steps with a railing? : A Lot 6 Click Score: 17    End of Session   Activity Tolerance: Patient tolerated treatment well;Patient limited by fatigue Patient left: in chair;with call bell/phone within reach Nurse Communication: Mobility status PT Visit Diagnosis: Unsteadiness on feet (R26.81);Other abnormalities of gait and mobility (R26.89);Muscle weakness (generalized) (M62.81)    Time: 2505-3976 PT Time Calculation (min) (ACUTE ONLY): 25 min   Charges:   PT Evaluation $PT Eval Moderate Complexity: 1 Mod PT Treatments $Therapeutic Activity: 23-37 mins        9:00 AM, 08/26/19 Lonell Grandchild, MPT Physical Therapist with [facility] 336 [phone] office [phone] mobile phone

## 2019-08-26 NOTE — Care Management Important Message (Signed)
Important Message  Patient Details  Name: Kerry Adams MRN: [id number] Date of Birth: Sep 24, 1955   Medicare Important Message Given:  Yes     Natasha Bence, LCSW 08/26/2019, 4:02 PM

## 2019-08-26 NOTE — Progress Notes (Signed)
  Echocardiogram 2D Echocardiogram has been performed.  Kerry Adams 08/26/2019, 10:55 AM

## 2019-08-26 NOTE — Care Management Obs Status (Signed)
[place] NOTIFICATION   Patient Details  Name: Kerry Adams MRN: [id number] Date of Birth: [birth year]   Medicare Observation Status Notification Given:  Yes    Salome Arnt, LCSW 08/26/2019, 10:20 AM

## 2019-08-27 DIAGNOSIS — Z7189 Other specified counseling: Secondary | ICD-10-CM

## 2019-08-27 DIAGNOSIS — F10231 Alcohol dependence with withdrawal delirium: Secondary | ICD-10-CM

## 2019-08-27 DIAGNOSIS — F10931 Alcohol use, unspecified with withdrawal delirium: Secondary | ICD-10-CM

## 2019-08-27 DIAGNOSIS — I4891 Unspecified atrial fibrillation: Secondary | ICD-10-CM

## 2019-08-27 DIAGNOSIS — F10139 Alcohol abuse with withdrawal, unspecified: Secondary | ICD-10-CM

## 2019-08-27 LAB — BASIC METABOLIC PANEL
Anion gap: 4 — ABNORMAL LOW (ref 5–15)
BUN: 9 mg/dL (ref 8–23)
CO2: 26 mmol/L (ref 22–32)
Calcium: 7.6 mg/dL — ABNORMAL LOW (ref 8.9–10.3)
Chloride: 101 mmol/L (ref 98–111)
Creatinine, Ser: 0.48 mg/dL — ABNORMAL LOW (ref 0.61–1.24)
GFR calc Af Amer: >60 mL/min (ref >60)
GFR calc non Af Amer: >60 mL/min (ref >60)
Glucose, Bld: 104 mg/dL — ABNORMAL HIGH (ref 70–99)
Potassium: 4.4 mmol/L (ref 3.5–5.1)
Sodium: 131 mmol/L — ABNORMAL LOW (ref 135–145)

## 2019-08-27 LAB — CBC
HCT: 34.9 % — ABNORMAL LOW (ref 39.0–52.0)
Hemoglobin: 11 g/dL — ABNORMAL LOW (ref 13.0–17.0)
MCH: 32.4 pg (ref 26.0–34.0)
MCHC: 31.5 g/dL (ref 30.0–36.0)
MCV: 102.6 fL — ABNORMAL HIGH (ref 80.0–100.0)
Platelets: 107 10*3/uL — ABNORMAL LOW (ref 150–400)
RBC: 3.4 MIL/uL — ABNORMAL LOW (ref 4.22–5.81)
RDW: 18.7 % — ABNORMAL HIGH (ref 11.5–15.5)
WBC: 5.2 10*3/uL (ref 4.0–10.5)
nRBC: 0 % (ref 0.0–0.2)

## 2019-08-27 LAB — MAGNESIUM: Magnesium: 1.6 mg/dL — ABNORMAL LOW (ref 1.7–2.4)

## 2019-08-27 MED ORDER — METOPROLOL TARTRATE 5 MG/5ML IV SOLN
2.5000 mg | Freq: Three times a day (TID) | INTRAVENOUS | Status: DC
  Administered 2019-08-27 – 2019-08-28 (×4): 2.5 mg via INTRAVENOUS
  Filled 2019-08-27 (×4): qty 5

## 2019-08-27 MED ORDER — PANTOPRAZOLE SODIUM 40 MG IV SOLR
40.0000 mg | Freq: Two times a day (BID) | INTRAVENOUS | Status: DC
  Administered 2019-08-27 – 2019-08-29 (×5): 40 mg via INTRAVENOUS
  Filled 2019-08-27 (×5): qty 40

## 2019-08-27 MED ORDER — DEXMEDETOMIDINE HCL IN NACL 400 MCG/100ML IV SOLN
0.4000 ug/kg/h | INTRAVENOUS | Status: DC
  Administered 2019-08-27: 0.4 ug/kg/h via INTRAVENOUS
  Administered 2019-08-27: 0.6 ug/kg/h via INTRAVENOUS
  Administered 2019-08-27: 0.4 ug/kg/h via INTRAVENOUS
  Administered 2019-08-28: 0.7 ug/kg/h via INTRAVENOUS
  Administered 2019-08-28: 0.5 ug/kg/h via INTRAVENOUS
  Filled 2019-08-27: qty 200
  Filled 2019-08-27 (×2): qty 100

## 2019-08-27 MED ORDER — SODIUM CHLORIDE 0.9 % IV SOLN
1.0000 mg | Freq: Every day | INTRAVENOUS | Status: DC
  Administered 2019-08-27 – 2019-08-29 (×3): 1 mg via INTRAVENOUS
  Filled 2019-08-27 (×4): qty 0.2

## 2019-08-27 MED ORDER — MAGNESIUM SULFATE 2 GM/50ML IV SOLN
2.0000 g | Freq: Once | INTRAVENOUS | Status: AC
  Administered 2019-08-27: 2 g via INTRAVENOUS
  Filled 2019-08-27: qty 50

## 2019-08-27 NOTE — Progress Notes (Signed)
PROGRESS NOTE  Kerry Adams WGN:[id number] DOB: Sep 29, 1955 DOA: 08/25/2019 PCP: Vicenta Aly, FNP   Brief History:  64 y.o.malewith medical history ofpersistent atrial fibrillation, hypertension, alcohol dependence, and metastatic hepatocellular carcinoma to the lung, bone, and lymph nodes.He does drink quite a bit, 12 beers and 2 shots a day.The patient was recently started onTecentriq/Avastin -- start cycle #1 on 06/11/2021for his hepatocellular carcinoma. He also finished up radiation therapy to his right hip approximately 3 weeks before that. He says that for the past 3 to 4 weeks he has experienced increasing generalized weakness and leg weakness. He has had multiple falls. In the past 2 to 3 days, the patient has had numerous falls the last being on 08/23/2019. Since his falls, the patient has had increasing pain in his right hip and increasing difficulty with ambulation and getting up out of his chair. He has been taking tramadol around-the-clock without significant help. He has not had any fevers, chills, headache, chest pain, shortness breath, cough, hemoptysis, nausea, vomiting, diarrhea, abdominal pain. Because of the increasing weakness and right hip pain, he presented for further evaluation. He denies any dysuria, hematochezia, melena, hematuria. He is compliant with all his medications. In the emergency department, the patient was afebrile and hemodynamically stable. He was noted to have atrial fibrillation with RVR with a heart rate 120s.The patient was given 2 IV pushes of metoprolol, 2.5, and then 5 mg. Subsequently, diltiazem drip was started. Patient was admitted for further evaluation and treatment.  On the evening of 08/26/2019, the patient became increasingly agitated requiring multiple doses of intravenous Ativan.  After 8 mg of Ativan, the patient remained agitated, and he was subsequently placed on a Precedex  drip.  Assessment/Plan:  Persistent atrial fibrillation with RVR -CHADSVasc--2 -Start diltiazem drip>>po diltiazem initially -transition off diltiazem due to low EF -Continue rivaroxaban -Echocardiogram--EF 45-50%, global HK, mild MR/trivial TR -TSH--3.265  Alcohol abuse with alcohol withdrawal -Remained agitated despite multiple doses of IV Ativan -Continue Precedex drip -As the patient is sedated on Precedex, transition to IV medications when possible  Generalized weakness -Multifactorial including the patient's recent immunotherapy/chemotherapy, deconditioning, and atrial fibrillation with RVR -TSH--3.265 -Serum H84--696 -Folic EXBM--8.4 -PT evaluation-->SNF  Uncontrolled pain -Patient has had increasing pain in the right hip area since his falls -08/25/2019 right hip x-ray--persistent destructive lesion in the greater trochanter of the proximal right femur with extension into the intertrochanteric region;no acute fracture -08/25/2019 CT brain--large round mass in the right lung apex presenting expansile bone lesion in the right first rib -Continue IV Dilaudid and oral oxycodone as needed for pain  Hyponatremia -due to poor solute intake and degree of SIADH -FeNa 0.24% -continue IVF  Metastatic hepatocellular carcinoma -Patient follows Dr. Marin Olp -He is status post palliative radiation to the right femur approximately 6 weeks prior to this admission -Tecentriq/Avastin -- startedcycle #1 on 08/05/2019  Essential hypertension -Continue metoprolol tartrate  Thrombocytopenia -Likely due to recent chemotherapy/immunotherapy in the setting of chronic alcohol use -Monitor for signs of bleeding  Hypomagnesemia -replete  Goals of care -08/27/2019--goals of care discussion with the patient's spouse -Continue to treat the treatable -Changed to DNR confirmed with spouse   Status is: Observation  Patient will need inpatient due to unsafe d/c home with need  for SNF--awaiting insurance authorization.  Continued IVF for hyponatremia  Dispo: The patient is from: Home  Anticipated d/c is to: SNF  Anticipated d/c date is: 1 day  Patient currently  is not medically stable to d/c.    The patient is critically ill with multiple organ systems failure and requires high complexity decision making for assessment and support, frequent evaluation and titration of therapies, application of advanced monitoring technologies and extensive interpretation of multiple databases.  Critical care time - 45 mins.      Family Communication:   Spouse updated at bedside 7/3  Consultants:  palliative  Code Status:  FULL   DVT Prophylaxis: xarelto   Procedures: As Listed in Progress Note Above  Antibiotics: None     Subjective: Patient is somnolent on Precedex drip but arousable.  He awakens and recognized his wife.  He was able to answer some simple questions.  He denied any headache, chest pain, shortness breath, abdominal pain.  Remainder review of systems not possible secondary to his sedation from Precedex.  Objective: Vitals:   08/27/19 0600 08/27/19 0700 08/27/19 0800 08/27/19 0845  BP: 114/80 107/70 114/71   Pulse: (!) 130 85 93 86  Resp: (!) 24 (!) 28 (!) 26 (!) 30  Temp:      TempSrc:      SpO2: 97% 96% 98% 98%  Weight: 93.2 kg     Height:        Intake/Output Summary (Last 24 hours) at 08/27/2019 5102 Last data filed at 08/27/2019 0500 Gross per 24 hour  Intake 35.69 ml  Output 175 ml  Net -139.31 ml   Weight change: -1.7 kg Exam:   General:  Pt is alert, follows commands appropriately, not in acute distress  HEENT: No icterus, No thrush, No neck mass, [place]/AT  Cardiovascular: IRRR, S1/S2, no rubs, no gallops  Respiratory: Bibasal crackles but no wheezing.  Good air movement  Abdomen: Soft/+BS, non tender, non distended, no guarding  Extremities: trace LE edema, No  lymphangitis, No petechiae, No rashes, no synovitis   Data Reviewed: I have personally reviewed following labs and imaging studies Basic Metabolic Panel: Recent Labs  Lab 08/25/19 1023 08/26/19 0433 08/27/19 0329  NA 129* 131* 131*  K 4.6 4.6 4.4  CL 99 99 101  CO2 22 24 26   GLUCOSE 108* 100* 104*  BUN 8 11 9   CREATININE 0.50* 0.46* 0.48*  CALCIUM 7.8* 7.7* 7.6*  MG  --  1.6* 1.6*  PHOS 2.9  --   --    Liver Function Tests: Recent Labs  Lab 08/25/19 1023 08/26/19 0433  AST 72* 70*  ALT 34 32  ALKPHOS 119 108  BILITOT 2.6* 2.7*  PROT 6.0* 5.2*  ALBUMIN 2.5* 2.2*   No results for input(s): LIPASE, AMYLASE in the last 168 hours. No results for input(s): AMMONIA in the last 168 hours. Coagulation Profile: No results for input(s): INR, PROTIME in the last 168 hours. CBC: Recent Labs  Lab 08/25/19 1023 08/26/19 0433 08/27/19 0329  WBC 7.8 5.7 5.2  NEUTROABS 5.7  --   --   HGB 12.5* 11.7* 11.0*  HCT 39.0 37.1* 34.9*  MCV 100.8* 102.8* 102.6*  PLT 109* 114* 107*   Cardiac Enzymes: No results for input(s): CKTOTAL, CKMB, CKMBINDEX, TROPONINI in the last 168 hours. BNP: Invalid input(s): POCBNP CBG: Recent Labs  Lab 08/25/19 0947  GLUCAP 106*   HbA1C: No results for input(s): HGBA1C in the last 72 hours. Urine analysis:    Component Value Date/Time   COLORURINE AMBER (A) 08/25/2019 1134   APPEARANCEUR CLEAR 08/25/2019 1134   LABSPEC 1.014 08/25/2019 1134   PHURINE 6.0 08/25/2019 1134   GLUCOSEU NEGATIVE  08/25/2019 [place] 08/25/2019 1134   BILIRUBINUR SMALL (A) 08/25/2019 1134   [place] 08/25/2019 1134   PROTEINUR NEGATIVE 08/25/2019 1134   UROBILINOGEN 0.2 02/18/2014 2043   NITRITE NEGATIVE 08/25/2019 1134   LEUKOCYTESUR NEGATIVE 08/25/2019 1134   Sepsis Labs: @LABRCNTIP (procalcitonin:4,lacticidven:4) ) Recent Results (from the past 240 hour(s))  SARS Coronavirus 2 by RT PCR (hospital order, performed in [facility]  hospital lab) Nasopharyngeal Nasopharyngeal Swab     Status: None   Collection Time: 08/25/19  2:35 PM   Specimen: Nasopharyngeal Swab  Result Value Ref Range Status   SARS Coronavirus 2 NEGATIVE NEGATIVE Final    Comment: (NOTE) SARS-CoV-2 target nucleic acids are NOT DETECTED.  The SARS-CoV-2 RNA is generally detectable in upper and lower respiratory specimens during the acute phase of infection. The lowest concentration of SARS-CoV-2 viral copies this assay can detect is 250 copies / mL. A negative result does not preclude SARS-CoV-2 infection and should not be used as the sole basis for treatment or other patient management decisions.  A negative result may occur with improper specimen collection / handling, submission of specimen other than nasopharyngeal swab, presence of viral mutation(s) within the areas targeted by this assay, and inadequate number of viral copies (<250 copies / mL). A negative result must be combined with clinical observations, patient history, and epidemiological information.  Fact Sheet for Patients:   StrictlyIdeas.no  Fact Sheet for Healthcare Providers: BankingDealers.co.za  This test is not yet approved or  cleared by the [place] FDA and has been authorized for detection and/or diagnosis of SARS-CoV-2 by FDA under an Emergency Use Authorization (EUA).  This EUA will remain in effect (meaning this test can be used) for the duration of the COVID-19 declaration under Section 564(b)(1) of the Act, 21 U.S.C. section 360bbb-3(b)(1), unless the authorization is terminated or revoked sooner.  Performed at [facility], [address] [place], [place] [address]   MRSA PCR Screening     Status: None   Collection Time: 08/25/19  4:40 PM   Specimen: Nasopharyngeal  Result Value Ref Range Status   MRSA by PCR NEGATIVE NEGATIVE Final    Comment:        The GeneXpert MRSA Assay (FDA approved for NASAL  specimens only), is one component of a comprehensive MRSA colonization surveillance program. It is not intended to diagnose MRSA infection nor to guide or monitor treatment for MRSA infections. Performed at [facility], [address], [place], [place] [address]      Scheduled Meds: . Chlorhexidine Gluconate Cloth  6 each Topical Daily  . diltiazem  30 mg Oral Q6H  . famotidine  10 mg Oral BH-q7a  . folic acid  1 mg Oral Daily  . metoprolol tartrate  50 mg Oral BID  . multivitamin with minerals  1 tablet Oral Daily  . oxybutynin  5 mg Oral TID  . polyethylene glycol  17 g Oral Daily  . rivaroxaban  20 mg Oral Q supper  . senna  2 tablet Oral Daily  . thiamine  100 mg Oral Daily   Or  . thiamine  100 mg Intravenous Daily   Continuous Infusions: . dexmedetomidine (PRECEDEX) IV infusion 0.4 mcg/kg/hr (08/27/19 0500)    Procedures/Studies: DG Chest 1 View  Result Date: 08/25/2019 CLINICAL DATA:  Fall. EXAM: CHEST  1 VIEW COMPARISON:  February 20, 2015.  June 20, 2019. FINDINGS: Stable cardiomegaly. No pneumothorax or pleural effusion is noted. Old right rib fractures  are noted. No acute pulmonary disease is noted. Large rounded mass is noted projected over right lung apex consistent with expansile bone lesion involving the right first rib as described on prior CT scan, concerning for malignancy. IMPRESSION: Large rounded mass is noted projected over right lung apex consistent with expansile bone lesion involving the right first rib as described on prior CT scan, concerning for malignancy. No acute pulmonary disease is noted. Aortic Atherosclerosis (ICD10-I70.0). Electronically Signed   By: Marijo Conception M.D.   On: 08/25/2019 10:38   DG Pelvis 1-2 Views  Result Date: 08/25/2019 CLINICAL DATA:  Right hip pain after fall. EXAM: PELVIS - 1-2 VIEW COMPARISON:  None. FINDINGS: Postsurgical changes are again noted involving the proximal right femur and intertrochanteric region. There is  noted severe lytic lesion involving the intertrochanteric region of the proximal right femur consistent with malignancy or metastatic disease. Degenerative changes are seen involving the pubic symphysis. Left hip and bilateral sacroiliac joints are unremarkable. No definite evidence of acute fracture is noted. IMPRESSION: Severe lytic lesion is noted involving the intertrochanteric region of the proximal right femur consistent with malignancy or metastatic disease. No acute fracture or dislocation is noted. Electronically Signed   By: Marijo Conception M.D.   On: 08/25/2019 10:43   CT Head Wo Contrast  Result Date: 08/25/2019 CLINICAL DATA:  [place] and hit head 3 days ago. Persistent headache. EXAM: CT HEAD WITHOUT CONTRAST TECHNIQUE: Contiguous axial images were obtained from the base of the skull through the vertex without intravenous contrast. COMPARISON:  None. FINDINGS: Brain: Mild age advanced cerebral atrophy, ventriculomegaly and periventricular white matter disease. No extra-axial fluid collections are identified. No CT findings for acute hemispheric infarction or intracranial hemorrhage. No mass lesions. The brainstem and cerebellum are normal. Vascular: Minimal vascular calcifications. No aneurysm or hyperdense vessels. Skull: No skull fracture or bone lesions. Sinuses/Orbits: The paranasal sinuses and mastoid air cells are clear. The globes are intact. Other: No scalp lesions or hematoma. IMPRESSION: 1. Mild age advanced cerebral atrophy, ventriculomegaly and periventricular white matter disease. 2. No acute intracranial findings or skull fracture. Electronically Signed   By: Marijo Sanes M.D.   On: 08/25/2019 10:25   US Venous Img Lower Bilateral (DVT)  Result Date: 08/18/2019 CLINICAL DATA:  Bilateral lower extremity pain and edema. History of malignancy. Evaluate for DVT. EXAM: BILATERAL LOWER EXTREMITY VENOUS DOPPLER ULTRASOUND TECHNIQUE: Gray-scale sonography with graded compression, as well as  color Doppler and duplex ultrasound were performed to evaluate the lower extremity deep venous systems from the level of the common femoral vein and including the common femoral, femoral, profunda femoral, popliteal and calf veins including the posterior tibial, peroneal and gastrocnemius veins when visible. The superficial great saphenous vein was also interrogated. Spectral Doppler was utilized to evaluate flow at rest and with distal augmentation maneuvers in the common femoral, femoral and popliteal veins. COMPARISON:  None. FINDINGS: RIGHT LOWER EXTREMITY Common Femoral Vein: No evidence of thrombus. Normal compressibility, respiratory phasicity and response to augmentation. Saphenofemoral Junction: No evidence of thrombus. Normal compressibility and flow on color Doppler imaging. Profunda Femoral Vein: No evidence of thrombus. Normal compressibility and flow on color Doppler imaging. Femoral Vein: No evidence of thrombus. Normal compressibility, respiratory phasicity and response to augmentation. Popliteal Vein: No evidence of thrombus. Normal compressibility, respiratory phasicity and response to augmentation. Calf Veins: No evidence of thrombus. Normal compressibility and flow on color Doppler imaging. Superficial Great Saphenous Vein: No evidence of thrombus. Normal compressibility. Venous  Reflux:  None. Other Findings:  None. LEFT LOWER EXTREMITY Common Femoral Vein: No evidence of thrombus. Normal compressibility, respiratory phasicity and response to augmentation. Saphenofemoral Junction: No evidence of thrombus. Normal compressibility and flow on color Doppler imaging. Profunda Femoral Vein: No evidence of thrombus. Normal compressibility and flow on color Doppler imaging. Femoral Vein: No evidence of thrombus. Normal compressibility, respiratory phasicity and response to augmentation. Popliteal Vein: No evidence of thrombus. Normal compressibility, respiratory phasicity and response to augmentation.  Calf Veins: No evidence of thrombus. Normal compressibility and flow on color Doppler imaging. Superficial Great Saphenous Vein: No evidence of thrombus. Normal compressibility. Venous Reflux:  None. Other Findings:  None. IMPRESSION: No evidence of DVT within either lower extremity. Electronically Signed   By: Sandi Mariscal M.D.   On: 08/18/2019 16:24   ECHOCARDIOGRAM COMPLETE  Result Date: 08/26/2019    ECHOCARDIOGRAM REPORT   Patient Name:   Kerry Adams Date of Exam: 08/26/2019 Medical Rec #:  [id number]       Height:       73.0 in Accession #:    [id number]      Weight:       204.6 lb Date of Birth:  1955-12-05       BSA:          2.172 m Patient Age:    70 years        BP:           112/65 mmHg Patient Gender: M               HR:           87 bpm. Exam Location:  Inpatient Procedure: 2D Echo, Cardiac Doppler and Color Doppler Indications:    Atrial fibrillation  History:        Patient has prior history of Echocardiogram examinations, most                 recent 07/20/2018. Risk Factors:Hypertension and Dyslipidemia.                 ETOH abuse.  Sonographer:    Clayton Lefort RDCS (AE) Referring Phys: [phone] Shreyan Hinz IMPRESSIONS  1. Left ventricular ejection fraction, by estimation, is 45 to 50%. The left ventricle has mildly decreased function. The left ventricle demonstrates global hypokinesis. There is mild left ventricular hypertrophy. Left ventricular diastolic parameters are indeterminate.  2. Right ventricular systolic function is low normal. The right ventricular size is mildly enlarged. There is mildly elevated pulmonary artery systolic pressure. The estimated right ventricular systolic pressure is 88.4 mmHg.  3. Left atrial size was severely dilated.  4. Right atrial size was severely dilated.  5. The mitral valve is grossly normal, mildly thickened. Mild mitral valve regurgitation.  6. The aortic valve is tricuspid. Aortic valve regurgitation is not visualized. Mild aortic valve stenosis. Aortic valve  area, by VTI measures 1.62 cm. Aortic valve mean gradient measures 9.0 mmHg.  7. The inferior vena cava is dilated in size with >50% respiratory variability, suggesting right atrial pressure of 8 mmHg. FINDINGS  Left Ventricle: Left ventricular ejection fraction, by estimation, is 45 to 50%. The left ventricle has mildly decreased function. The left ventricle demonstrates global hypokinesis. The left ventricular internal cavity size was normal in size. There is  mild left ventricular hypertrophy. Left ventricular diastolic parameters are indeterminate. Right Ventricle: The right ventricular size is mildly enlarged. No increase in right ventricular wall thickness. Right ventricular systolic function is low  normal. There is mildly elevated pulmonary artery systolic pressure. The tricuspid regurgitant velocity is 2.93 m/s, and with an assumed right atrial pressure of 8 mmHg, the estimated right ventricular systolic pressure is 82.5 mmHg. Left Atrium: Left atrial size was severely dilated. Right Atrium: Right atrial size was severely dilated. Pericardium: There is no evidence of pericardial effusion. Mitral Valve: The mitral valve is grossly normal. Mild to moderate mitral annular calcification. Mild mitral valve regurgitation. Tricuspid Valve: The tricuspid valve is grossly normal. Tricuspid valve regurgitation is trivial. Aortic Valve: The aortic valve is tricuspid. Aortic valve regurgitation is not visualized. Mild aortic stenosis is present. Mild to moderate aortic valve annular calcification. Aortic valve mean gradient measures 9.0 mmHg. Aortic valve peak gradient measures 14.9 mmHg. Aortic valve area, by VTI measures 1.62 cm. Pulmonic Valve: The pulmonic valve was grossly normal. Pulmonic valve regurgitation is trivial. Aorta: The aortic root is normal in size and structure. Venous: The inferior vena cava is dilated in size with greater than 50% respiratory variability, suggesting right atrial pressure of 8  mmHg. IAS/Shunts: No atrial level shunt detected by color flow Doppler.  LEFT VENTRICLE PLAX 2D LVIDd:         4.75 cm LVIDs:         3.80 cm LV PW:         1.32 cm LV IVS:        1.05 cm LVOT diam:     2.10 cm LV SV:         55 LV SV Index:   25 LVOT Area:     3.46 cm  RIGHT VENTRICLE             IVC RV Basal diam:  5.19 cm     IVC diam: 2.44 cm RV Mid diam:    3.69 cm RV S prime:     12.40 cm/s TAPSE (M-mode): 2.0 cm LEFT ATRIUM              Index       RIGHT ATRIUM           Index LA diam:        5.90 cm  2.72 cm/m  RA Area:     39.80 cm LA Vol (A2C):   161.0 ml 74.11 ml/m RA Volume:   172.00 ml 79.17 ml/m LA Vol (A4C):   140.0 ml 64.44 ml/m LA Biplane Vol: 154.0 ml 70.89 ml/m  AORTIC VALVE AV Area (Vmax):    1.57 cm AV Area (Vmean):   1.53 cm AV Area (VTI):     1.62 cm AV Vmax:           193.20 cm/s AV Vmean:          142.600 cm/s AV VTI:            0.339 m AV Peak Grad:      14.9 mmHg AV Mean Grad:      9.0 mmHg LVOT Vmax:         87.60 cm/s LVOT Vmean:        63.140 cm/s LVOT VTI:          0.158 m LVOT/AV VTI ratio: 0.47  AORTA Ao Root diam: 3.50 cm TRICUSPID VALVE TR Peak grad:   34.3 mmHg TR Vmax:        293.00 cm/s  SHUNTS Systemic VTI:  0.16 m Systemic Diam: 2.10 cm Rozann Lesches MD Electronically signed by Rozann Lesches MD Signature Date/Time: 08/26/2019/11:00:17 AM  Final    DG Femur Min 2 Views Right  Result Date: 08/25/2019 CLINICAL DATA:  Right hip pain after fall. EXAM: RIGHT FEMUR 2 VIEWS COMPARISON:  April 07, 2019. FINDINGS: Status post surgical internal fixation old proximal right femoral pathologic fracture. Intramedullary rod fixation of the right femoral shaft is noted. Status post right total knee arthroplasty. Vascular calcifications are noted. No acute fracture or dislocation is noted. There is continued presence of destructive lesion involving greater trochanter of the proximal right femur with extension into the intertrochanteric region and shaft. No acute fracture is  noted. IMPRESSION: Postsurgical changes as described above. Continued presence of destructive lesion involving greater trochanter of proximal right femur with extension into the intertrochanteric region and shaft. Electronically Signed   By: Marijo Conception M.D.   On: 08/25/2019 10:42    Orson Eva, DO  [facility]  If 7PM-7AM, please contact night-coverage [email or web address] Password [email or web address] 08/27/2019, 9:03 AM   LOS: 1 day

## 2019-08-27 NOTE — Progress Notes (Signed)
Patient very agitated, pulling out lines, fighting staff, getting out of bed. Ativan ineffective, MD aware. Orders placed and followed.

## 2019-08-28 LAB — CBC
HCT: 41.1 % (ref 39.0–52.0)
Hemoglobin: 12.9 g/dL — ABNORMAL LOW (ref 13.0–17.0)
MCH: 32.3 pg (ref 26.0–34.0)
MCHC: 31.4 g/dL (ref 30.0–36.0)
MCV: 102.8 fL — ABNORMAL HIGH (ref 80.0–100.0)
Platelets: 115 10*3/uL — ABNORMAL LOW (ref 150–400)
RBC: 4 MIL/uL — ABNORMAL LOW (ref 4.22–5.81)
RDW: 18.6 % — ABNORMAL HIGH (ref 11.5–15.5)
WBC: 7.1 10*3/uL (ref 4.0–10.5)
nRBC: 0 % (ref 0.0–0.2)

## 2019-08-28 LAB — BASIC METABOLIC PANEL
Anion gap: 9 (ref 5–15)
BUN: 11 mg/dL (ref 8–23)
CO2: 22 mmol/L (ref 22–32)
Calcium: 7.9 mg/dL — ABNORMAL LOW (ref 8.9–10.3)
Chloride: 99 mmol/L (ref 98–111)
Creatinine, Ser: 0.53 mg/dL — ABNORMAL LOW (ref 0.61–1.24)
GFR calc Af Amer: >60 mL/min (ref >60)
GFR calc non Af Amer: >60 mL/min (ref >60)
Glucose, Bld: 110 mg/dL — ABNORMAL HIGH (ref 70–99)
Potassium: 4.7 mmol/L (ref 3.5–5.1)
Sodium: 130 mmol/L — ABNORMAL LOW (ref 135–145)

## 2019-08-28 LAB — MAGNESIUM: Magnesium: 1.6 mg/dL — ABNORMAL LOW (ref 1.7–2.4)

## 2019-08-28 MED ORDER — METOPROLOL TARTRATE 5 MG/5ML IV SOLN: 2.5000 mg | Freq: Four times a day (QID) | INTRAVENOUS | Status: DC

## 2019-08-28 MED ORDER — METOPROLOL TARTRATE 5 MG/5ML IV SOLN
5.0000 mg | Freq: Four times a day (QID) | INTRAVENOUS | Status: DC
  Administered 2019-08-28 – 2019-08-29 (×4): 5 mg via INTRAVENOUS
  Filled 2019-08-28 (×4): qty 5

## 2019-08-28 MED ORDER — MAGNESIUM SULFATE 2 GM/50ML IV SOLN
2.0000 g | Freq: Once | INTRAVENOUS | Status: AC
  Administered 2019-08-28: 2 g via INTRAVENOUS
  Filled 2019-08-28: qty 50

## 2019-08-28 NOTE — Progress Notes (Addendum)
PROGRESS NOTE  Kerry Adams YQM:[id number] DOB: 09/08/1955 DOA: 08/25/2019 PCP: Vicenta Aly, FNP Brief History: 64 y.o.malewith medical history ofpersistent atrial fibrillation, hypertension, alcohol dependence, and metastatic hepatocellular carcinoma to the lung, bone, and lymph nodes.He does drink quite a bit, 12 beers and 2 shots a day.The patient was recently started onTecentriq/Avastin -- start cycle #1 on 06/11/2021for his hepatocellular carcinoma. He also finished up radiation therapy to his right hip approximately 3 weeks before that. He says that for the past 3 to 4 weeks he has experienced increasing generalized weakness and leg weakness. He has had multiple falls. In the past 2 to 3 days, the patient has had numerous falls the last being on 08/23/2019. Since his falls, the patient has had increasing pain in his right hip and increasing difficulty with ambulation and getting up out of his chair. He has been taking tramadol around-the-clock without significant help. He has not had any fevers, chills, headache, chest pain, shortness breath, cough, hemoptysis, nausea, vomiting, diarrhea, abdominal pain. Because of the increasing weakness and right hip pain, he presented for further evaluation. He denies any dysuria, hematochezia, melena, hematuria. He is compliant with all his medications. In the emergency department, the patient was afebrile and hemodynamically stable. He was noted to have atrial fibrillation with RVR with a heart rate 120s.The patient was given 2 IV pushes of metoprolol, 2.5, and then 5 mg. Subsequently, diltiazem drip was started. Patient was admitted for further evaluation and treatment.  On the evening of 08/26/2019, the patient became increasingly agitated requiring multiple doses of intravenous Ativan.  After 8 mg of Ativan, the patient remained agitated, and he was subsequently placed on a Precedex  drip.  Assessment/Plan:  Persistent atrial fibrillationwith RVR -CHADSVasc--2 -Start diltiazem drip>>po diltiazem initially -transition off diltiazem due to low EF -Continue rivaroxaban -Echocardiogram--EF 45-50%, global HK, mild MR/trivial TR -TSH--3.265 -increase IV lopressor to 5 mg IV q 6 hours -transition to po metoprolol once safe to take po  Alcohol abuse with alcohol withdrawal -Remained agitated despite multiple doses of IV Ativan -08/28/19--wean off Precedex today if possible  Generalized weakness -Multifactorial including the patient's recent immunotherapy/chemotherapy, deconditioning, and atrial fibrillation with RVR -TSH--3.265 -Serum B28--413 -Folic KGMW--1.0 -PT evaluation-->SNF-->pt and wife refuses -plan for d/c home with HHPT when stable  Uncontrolled pain -Patient has had increasing pain in the right hip area since his falls -08/25/2019 right hip x-ray--persistent destructive lesion in the greater trochanter of the proximal right femur with extension into the intertrochanteric region;no acute fracture -08/25/2019 CT brain--large round mass in the right lung apex presenting expansile bone lesion in the right first rib -ContinueIV Dilaudid and oral oxycodone as needed for pain -PMP Aware reviewed--pt was on tramadol prior to hospitalization  Hyponatremia -due to poor solute intake and degree of SIADH -FeNa 0.24% -continue judicious IVF  Metastatic hepatocellular carcinoma -Patient follows Dr. Marin Olp -He is status post palliative radiation to the right femur approximately 6 weeks prior to this admission -Tecentriq/Avastin -- startedcycle #1 on 08/05/2019  Essential hypertension -Convert to metoprolol succinate when safe to take po  Thrombocytopenia -Likely due to recent chemotherapy/immunotherapy in the setting of chronic alcohol use -Monitor for signs of bleeding  Hypomagnesemia -replete  Goals of care -08/27/2019--goals of care  discussion with the patient's spouse -Continue to treat the treatable -Changed to DNR confirmed with spouse   Status is: inpatient  Patient will need inpatient due to unsafe d/c home and presently on  Precedex drip  Dispo: The patient is from:Home Anticipated d/c is to:SNF Anticipated d/c date is: 1-2 day Patient currently is not medically stable to d/c.    The patient is critically ill with multiple organ systems failure and requires high complexity decision making for assessment and support, frequent evaluation and titration of therapies, application of advanced monitoring technologies and extensive interpretation of multiple databases.  Critical care time - 35 mins.      Family Communication:Spouse updatedat bedside 7/4  Consultants:palliative  Code Status: FULL   DVT Prophylaxis:xarelto   Procedures: As Listed in Progress Note Above  Antibiotics: None    Subjective: Pt arouses to voice.  Patient denies fevers, chills, headache, chest pain, dyspnea, nausea, vomiting, diarrhea, abdominal pain, dysuria.   Objective: Vitals:   08/28/19 0600 08/28/19 0630 08/28/19 0700 08/28/19 0800  BP: (!) 132/95 132/80 115/86 (!) 128/99  Pulse: 84 (!) 113 (!) 103 99  Resp: (!) 27 (!) 29 (!) 23 (!) 22  Temp:      TempSrc:      SpO2: 99% 97% 96% 97%  Weight:      Height:        Intake/Output Summary (Last 24 hours) at 08/28/2019 0908 Last data filed at 08/28/2019 0300 Gross per 24 hour  Intake 356.71 ml  Output 1600 ml  Net -1243.29 ml   Weight change: -1 kg Exam:   General:  Pt is alert, follows commands appropriately, not in acute distress  HEENT: No icterus, No thrush, No neck mass, [place]/AT  Cardiovascular: IRRR, S1/S2, no rubs, no gallops  Respiratory: bibasilar crackles. No wheeze  Abdomen: Soft/+BS, non tender, non distended, no guarding  Extremities: No edema, No lymphangitis, No petechiae,  No rashes, no synovitis   Data Reviewed: I have personally reviewed following labs and imaging studies Basic Metabolic Panel: Recent Labs  Lab 08/25/19 1023 08/26/19 0433 08/27/19 0329 08/28/19 0452  NA 129* 131* 131* 130*  K 4.6 4.6 4.4 4.7  CL 99 99 101 99  CO2 22 24 26 22   GLUCOSE 108* 100* 104* 110*  BUN 8 11 9 11   CREATININE 0.50* 0.46* 0.48* 0.53*  CALCIUM 7.8* 7.7* 7.6* 7.9*  MG  --  1.6* 1.6* 1.6*  PHOS 2.9  --   --   --    Liver Function Tests: Recent Labs  Lab 08/25/19 1023 08/26/19 0433  AST 72* 70*  ALT 34 32  ALKPHOS 119 108  BILITOT 2.6* 2.7*  PROT 6.0* 5.2*  ALBUMIN 2.5* 2.2*   No results for input(s): LIPASE, AMYLASE in the last 168 hours. No results for input(s): AMMONIA in the last 168 hours. Coagulation Profile: No results for input(s): INR, PROTIME in the last 168 hours. CBC: Recent Labs  Lab 08/25/19 1023 08/26/19 0433 08/27/19 0329 08/28/19 0452  WBC 7.8 5.7 5.2 7.1  NEUTROABS 5.7  --   --   --   HGB 12.5* 11.7* 11.0* 12.9*  HCT 39.0 37.1* 34.9* 41.1  MCV 100.8* 102.8* 102.6* 102.8*  PLT 109* 114* 107* 115*   Cardiac Enzymes: No results for input(s): CKTOTAL, CKMB, CKMBINDEX, TROPONINI in the last 168 hours. BNP: Invalid input(s): POCBNP CBG: Recent Labs  Lab 08/25/19 0947  GLUCAP 106*   HbA1C: No results for input(s): HGBA1C in the last 72 hours. Urine analysis:    Component Value Date/Time   COLORURINE AMBER (A) 08/25/2019 1134   APPEARANCEUR CLEAR 08/25/2019 1134   LABSPEC 1.014 08/25/2019 1134   PHURINE 6.0 08/25/2019 1134  GLUCOSEU NEGATIVE 08/25/2019 [place] 08/25/2019 1134   BILIRUBINUR SMALL (A) 08/25/2019 1134   KETONESUR NEGATIVE 08/25/2019 1134   PROTEINUR NEGATIVE 08/25/2019 1134   UROBILINOGEN 0.2 02/18/2014 2043   NITRITE NEGATIVE 08/25/2019 1134   LEUKOCYTESUR NEGATIVE 08/25/2019 1134   Sepsis Labs: @LABRCNTIP (procalcitonin:4,lacticidven:4) ) Recent Results (from the past 240  hour(s))  SARS Coronavirus 2 by RT PCR (hospital order, performed in [facility] hospital lab) Nasopharyngeal Nasopharyngeal Swab     Status: None   Collection Time: 08/25/19  2:35 PM   Specimen: Nasopharyngeal Swab  Result Value Ref Range Status   SARS Coronavirus 2 NEGATIVE NEGATIVE Final    Comment: (NOTE) SARS-CoV-2 target nucleic acids are NOT DETECTED.  The SARS-CoV-2 RNA is generally detectable in upper and lower respiratory specimens during the acute phase of infection. The lowest concentration of SARS-CoV-2 viral copies this assay can detect is 250 copies / mL. A negative result does not preclude SARS-CoV-2 infection and should not be used as the sole basis for treatment or other patient management decisions.  A negative result may occur with improper specimen collection / handling, submission of specimen other than nasopharyngeal swab, presence of viral mutation(s) within the areas targeted by this assay, and inadequate number of viral copies (<250 copies / mL). A negative result must be combined with clinical observations, patient history, and epidemiological information.  Fact Sheet for Patients:   StrictlyIdeas.no  Fact Sheet for Healthcare Providers: BankingDealers.co.za  This test is not yet approved or  cleared by the [place] FDA and has been authorized for detection and/or diagnosis of SARS-CoV-2 by FDA under an Emergency Use Authorization (EUA).  This EUA will remain in effect (meaning this test can be used) for the duration of the COVID-19 declaration under Section 564(b)(1) of the Act, 21 U.S.C. section 360bbb-3(b)(1), unless the authorization is terminated or revoked sooner.  Performed at [facility], [address], [place], [place] [address]   MRSA PCR Screening     Status: None   Collection Time: 08/25/19  4:40 PM   Specimen: Nasopharyngeal  Result Value Ref Range Status   MRSA by PCR NEGATIVE  NEGATIVE Final    Comment:        The GeneXpert MRSA Assay (FDA approved for NASAL specimens only), is one component of a comprehensive MRSA colonization surveillance program. It is not intended to diagnose MRSA infection nor to guide or monitor treatment for MRSA infections. Performed at [facility], [address] [place], [place] [address]      Scheduled Meds: . Chlorhexidine Gluconate Cloth  6 each Topical Daily  . metoprolol tartrate  2.5 mg Intravenous Q6H  . oxybutynin  5 mg Oral TID  . pantoprazole (PROTONIX) IV  40 mg Intravenous Q12H  . polyethylene glycol  17 g Oral Daily  . rivaroxaban  20 mg Oral Q supper  . senna  2 tablet Oral Daily  . thiamine  100 mg Oral Daily   Or  . thiamine  100 mg Intravenous Daily   Continuous Infusions: . dexmedetomidine (PRECEDEX) IV infusion 0.5 mcg/kg/hr (08/28/19 0829)  . folic acid (FOLVITE) IVPB Stopped (08/27/19 1203)  . magnesium sulfate bolus IVPB      Procedures/Studies: DG Chest 1 View  Result Date: 08/25/2019 CLINICAL DATA:  Fall. EXAM: CHEST  1 VIEW COMPARISON:  February 20, 2015.  June 20, 2019. FINDINGS: Stable cardiomegaly. No pneumothorax or pleural effusion is noted. Old right rib fractures are noted. No acute pulmonary disease  is noted. Large rounded mass is noted projected over right lung apex consistent with expansile bone lesion involving the right first rib as described on prior CT scan, concerning for malignancy. IMPRESSION: Large rounded mass is noted projected over right lung apex consistent with expansile bone lesion involving the right first rib as described on prior CT scan, concerning for malignancy. No acute pulmonary disease is noted. Aortic Atherosclerosis (ICD10-I70.0). Electronically Signed   By: Marijo Conception M.D.   On: 08/25/2019 10:38   DG Pelvis 1-2 Views  Result Date: 08/25/2019 CLINICAL DATA:  Right hip pain after fall. EXAM: PELVIS - 1-2 VIEW COMPARISON:  None. FINDINGS: Postsurgical changes  are again noted involving the proximal right femur and intertrochanteric region. There is noted severe lytic lesion involving the intertrochanteric region of the proximal right femur consistent with malignancy or metastatic disease. Degenerative changes are seen involving the pubic symphysis. Left hip and bilateral sacroiliac joints are unremarkable. No definite evidence of acute fracture is noted. IMPRESSION: Severe lytic lesion is noted involving the intertrochanteric region of the proximal right femur consistent with malignancy or metastatic disease. No acute fracture or dislocation is noted. Electronically Signed   By: Marijo Conception M.D.   On: 08/25/2019 10:43   CT Head Wo Contrast  Result Date: 08/25/2019 CLINICAL DATA:  [place] and hit head 3 days ago. Persistent headache. EXAM: CT HEAD WITHOUT CONTRAST TECHNIQUE: Contiguous axial images were obtained from the base of the skull through the vertex without intravenous contrast. COMPARISON:  None. FINDINGS: Brain: Mild age advanced cerebral atrophy, ventriculomegaly and periventricular white matter disease. No extra-axial fluid collections are identified. No CT findings for acute hemispheric infarction or intracranial hemorrhage. No mass lesions. The brainstem and cerebellum are normal. Vascular: Minimal vascular calcifications. No aneurysm or hyperdense vessels. Skull: No skull fracture or bone lesions. Sinuses/Orbits: The paranasal sinuses and mastoid air cells are clear. The globes are intact. Other: No scalp lesions or hematoma. IMPRESSION: 1. Mild age advanced cerebral atrophy, ventriculomegaly and periventricular white matter disease. 2. No acute intracranial findings or skull fracture. Electronically Signed   By: Marijo Sanes M.D.   On: 08/25/2019 10:25   US Venous Img Lower Bilateral (DVT)  Result Date: 08/18/2019 CLINICAL DATA:  Bilateral lower extremity pain and edema. History of malignancy. Evaluate for DVT. EXAM: BILATERAL LOWER EXTREMITY  VENOUS DOPPLER ULTRASOUND TECHNIQUE: Gray-scale sonography with graded compression, as well as color Doppler and duplex ultrasound were performed to evaluate the lower extremity deep venous systems from the level of the common femoral vein and including the common femoral, femoral, profunda femoral, popliteal and calf veins including the posterior tibial, peroneal and gastrocnemius veins when visible. The superficial great saphenous vein was also interrogated. Spectral Doppler was utilized to evaluate flow at rest and with distal augmentation maneuvers in the common femoral, femoral and popliteal veins. COMPARISON:  None. FINDINGS: RIGHT LOWER EXTREMITY Common Femoral Vein: No evidence of thrombus. Normal compressibility, respiratory phasicity and response to augmentation. Saphenofemoral Junction: No evidence of thrombus. Normal compressibility and flow on color Doppler imaging. Profunda Femoral Vein: No evidence of thrombus. Normal compressibility and flow on color Doppler imaging. Femoral Vein: No evidence of thrombus. Normal compressibility, respiratory phasicity and response to augmentation. Popliteal Vein: No evidence of thrombus. Normal compressibility, respiratory phasicity and response to augmentation. Calf Veins: No evidence of thrombus. Normal compressibility and flow on color Doppler imaging. Superficial Great Saphenous Vein: No evidence of thrombus. Normal compressibility. Venous Reflux:  None. Other Findings:  None. LEFT LOWER EXTREMITY Common Femoral Vein: No evidence of thrombus. Normal compressibility, respiratory phasicity and response to augmentation. Saphenofemoral Junction: No evidence of thrombus. Normal compressibility and flow on color Doppler imaging. Profunda Femoral Vein: No evidence of thrombus. Normal compressibility and flow on color Doppler imaging. Femoral Vein: No evidence of thrombus. Normal compressibility, respiratory phasicity and response to augmentation. Popliteal Vein: No  evidence of thrombus. Normal compressibility, respiratory phasicity and response to augmentation. Calf Veins: No evidence of thrombus. Normal compressibility and flow on color Doppler imaging. Superficial Great Saphenous Vein: No evidence of thrombus. Normal compressibility. Venous Reflux:  None. Other Findings:  None. IMPRESSION: No evidence of DVT within either lower extremity. Electronically Signed   By: Sandi Mariscal M.D.   On: 08/18/2019 16:24   ECHOCARDIOGRAM COMPLETE  Result Date: 08/26/2019    ECHOCARDIOGRAM REPORT   Patient Name:   KIET GEER Date of Exam: 08/26/2019 Medical Rec #:  [id number]       Height:       73.0 in Accession #:    [id number]      Weight:       204.6 lb Date of Birth:  [birth year]       BSA:          2.172 m Patient Age:    48 years        BP:           112/65 mmHg Patient Gender: M               HR:           87 bpm. Exam Location:  Inpatient Procedure: 2D Echo, Cardiac Doppler and Color Doppler Indications:    Atrial fibrillation  History:        Patient has prior history of Echocardiogram examinations, most                 recent 07/20/2018. Risk Factors:Hypertension and Dyslipidemia.                 ETOH abuse.  Sonographer:    Clayton Lefort RDCS (AE) Referring Phys: [id number] Reagan Behlke IMPRESSIONS  1. Left ventricular ejection fraction, by estimation, is 45 to 50%. The left ventricle has mildly decreased function. The left ventricle demonstrates global hypokinesis. There is mild left ventricular hypertrophy. Left ventricular diastolic parameters are indeterminate.  2. Right ventricular systolic function is low normal. The right ventricular size is mildly enlarged. There is mildly elevated pulmonary artery systolic pressure. The estimated right ventricular systolic pressure is 29.5 mmHg.  3. Left atrial size was severely dilated.  4. Right atrial size was severely dilated.  5. The mitral valve is grossly normal, mildly thickened. Mild mitral valve regurgitation.  6. The aortic valve is  tricuspid. Aortic valve regurgitation is not visualized. Mild aortic valve stenosis. Aortic valve area, by VTI measures 1.62 cm. Aortic valve mean gradient measures 9.0 mmHg.  7. The inferior vena cava is dilated in size with >50% respiratory variability, suggesting right atrial pressure of 8 mmHg. FINDINGS  Left Ventricle: Left ventricular ejection fraction, by estimation, is 45 to 50%. The left ventricle has mildly decreased function. The left ventricle demonstrates global hypokinesis. The left ventricular internal cavity size was normal in size. There is  mild left ventricular hypertrophy. Left ventricular diastolic parameters are indeterminate. Right Ventricle: The right ventricular size is mildly enlarged. No increase in right ventricular wall thickness. Right ventricular systolic function is low normal. There is mildly elevated pulmonary  artery systolic pressure. The tricuspid regurgitant velocity is 2.93 m/s, and with an assumed right atrial pressure of 8 mmHg, the estimated right ventricular systolic pressure is 79.0 mmHg. Left Atrium: Left atrial size was severely dilated. Right Atrium: Right atrial size was severely dilated. Pericardium: There is no evidence of pericardial effusion. Mitral Valve: The mitral valve is grossly normal. Mild to moderate mitral annular calcification. Mild mitral valve regurgitation. Tricuspid Valve: The tricuspid valve is grossly normal. Tricuspid valve regurgitation is trivial. Aortic Valve: The aortic valve is tricuspid. Aortic valve regurgitation is not visualized. Mild aortic stenosis is present. Mild to moderate aortic valve annular calcification. Aortic valve mean gradient measures 9.0 mmHg. Aortic valve peak gradient measures 14.9 mmHg. Aortic valve area, by VTI measures 1.62 cm. Pulmonic Valve: The pulmonic valve was grossly normal. Pulmonic valve regurgitation is trivial. Aorta: The aortic root is normal in size and structure. Venous: The inferior vena cava is dilated  in size with greater than 50% respiratory variability, suggesting right atrial pressure of 8 mmHg. IAS/Shunts: No atrial level shunt detected by color flow Doppler.  LEFT VENTRICLE PLAX 2D LVIDd:         4.75 cm LVIDs:         3.80 cm LV PW:         1.32 cm LV IVS:        1.05 cm LVOT diam:     2.10 cm LV SV:         55 LV SV Index:   25 LVOT Area:     3.46 cm  RIGHT VENTRICLE             IVC RV Basal diam:  5.19 cm     IVC diam: 2.44 cm RV Mid diam:    3.69 cm RV S prime:     12.40 cm/s TAPSE (M-mode): 2.0 cm LEFT ATRIUM              Index       RIGHT ATRIUM           Index LA diam:        5.90 cm  2.72 cm/m  RA Area:     39.80 cm LA Vol (A2C):   161.0 ml 74.11 ml/m RA Volume:   172.00 ml 79.17 ml/m LA Vol (A4C):   140.0 ml 64.44 ml/m LA Biplane Vol: 154.0 ml 70.89 ml/m  AORTIC VALVE AV Area (Vmax):    1.57 cm AV Area (Vmean):   1.53 cm AV Area (VTI):     1.62 cm AV Vmax:           193.20 cm/s AV Vmean:          142.600 cm/s AV VTI:            0.339 m AV Peak Grad:      14.9 mmHg AV Mean Grad:      9.0 mmHg LVOT Vmax:         87.60 cm/s LVOT Vmean:        63.140 cm/s LVOT VTI:          0.158 m LVOT/AV VTI ratio: 0.47  AORTA Ao Root diam: 3.50 cm TRICUSPID VALVE TR Peak grad:   34.3 mmHg TR Vmax:        293.00 cm/s  SHUNTS Systemic VTI:  0.16 m Systemic Diam: 2.10 cm Rozann Lesches MD Electronically signed by Rozann Lesches MD Signature Date/Time: 08/26/2019/11:00:17 AM    Final    DG  Femur Min 2 Views Right  Result Date: 08/25/2019 CLINICAL DATA:  Right hip pain after fall. EXAM: RIGHT FEMUR 2 VIEWS COMPARISON:  April 07, 2019. FINDINGS: Status post surgical internal fixation old proximal right femoral pathologic fracture. Intramedullary rod fixation of the right femoral shaft is noted. Status post right total knee arthroplasty. Vascular calcifications are noted. No acute fracture or dislocation is noted. There is continued presence of destructive lesion involving greater trochanter of the proximal  right femur with extension into the intertrochanteric region and shaft. No acute fracture is noted. IMPRESSION: Postsurgical changes as described above. Continued presence of destructive lesion involving greater trochanter of proximal right femur with extension into the intertrochanteric region and shaft. Electronically Signed   By: Marijo Conception M.D.   On: 08/25/2019 10:42    Orson Eva, DO  [facility]  If 7PM-7AM, please contact night-coverage [email or web address] Password [email or web address] 08/28/2019, 9:08 AM   LOS: 2 days

## 2019-08-29 DIAGNOSIS — F10139 Alcohol abuse with withdrawal, unspecified: Secondary | ICD-10-CM

## 2019-08-29 LAB — CBC
HCT: 38.6 % — ABNORMAL LOW (ref 39.0–52.0)
Hemoglobin: 12.5 g/dL — ABNORMAL LOW (ref 13.0–17.0)
MCH: 32.5 pg (ref 26.0–34.0)
MCHC: 32.4 g/dL (ref 30.0–36.0)
MCV: 100.3 fL — ABNORMAL HIGH (ref 80.0–100.0)
Platelets: 124 10*3/uL — ABNORMAL LOW (ref 150–400)
RBC: 3.85 MIL/uL — ABNORMAL LOW (ref 4.22–5.81)
RDW: 18.5 % — ABNORMAL HIGH (ref 11.5–15.5)
WBC: 7.9 10*3/uL (ref 4.0–10.5)
nRBC: 0 % (ref 0.0–0.2)

## 2019-08-29 LAB — COMPREHENSIVE METABOLIC PANEL
ALT: 36 U/L (ref 0–44)
AST: 81 U/L — ABNORMAL HIGH (ref 15–41)
Albumin: 2.1 g/dL — ABNORMAL LOW (ref 3.5–5.0)
Alkaline Phosphatase: 118 U/L (ref 38–126)
Anion gap: 10 (ref 5–15)
BUN: 10 mg/dL (ref 8–23)
CO2: 23 mmol/L (ref 22–32)
Calcium: 8.1 mg/dL — ABNORMAL LOW (ref 8.9–10.3)
Chloride: 99 mmol/L (ref 98–111)
Creatinine, Ser: 0.59 mg/dL — ABNORMAL LOW (ref 0.61–1.24)
GFR calc Af Amer: >60 mL/min (ref >60)
GFR calc non Af Amer: >60 mL/min (ref >60)
Glucose, Bld: 112 mg/dL — ABNORMAL HIGH (ref 70–99)
Potassium: 4 mmol/L (ref 3.5–5.1)
Sodium: 132 mmol/L — ABNORMAL LOW (ref 135–145)
Total Bilirubin: 2.4 mg/dL — ABNORMAL HIGH (ref 0.3–1.2)
Total Protein: 5.5 g/dL — ABNORMAL LOW (ref 6.5–8.1)

## 2019-08-29 LAB — MAGNESIUM: Magnesium: 1.7 mg/dL (ref 1.7–2.4)

## 2019-08-29 MED ORDER — DILTIAZEM HCL-DEXTROSE 125-5 MG/125ML-% IV SOLN (PREMIX)
5.0000 mg/h | INTRAVENOUS | Status: DC
  Administered 2019-08-29: 5 mg/h via INTRAVENOUS
  Administered 2019-08-29: 10 mg/h via INTRAVENOUS
  Administered 2019-08-30: 7.5 mg/h via INTRAVENOUS
  Filled 2019-08-29 (×3): qty 125

## 2019-08-29 MED ORDER — DIAZEPAM 5 MG PO TABS
5.0000 mg | ORAL_TABLET | Freq: Three times a day (TID) | ORAL | Status: AC
  Administered 2019-08-29 – 2019-08-31 (×9): 5 mg via ORAL
  Filled 2019-08-29 (×9): qty 1

## 2019-08-29 MED ORDER — ADULT MULTIVITAMIN W/MINERALS CH
1.0000 | ORAL_TABLET | Freq: Every day | ORAL | Status: DC
  Administered 2019-08-29 – 2019-09-02 (×5): 1 via ORAL
  Filled 2019-08-29 (×5): qty 1

## 2019-08-29 MED ORDER — MAGNESIUM SULFATE 2 GM/50ML IV SOLN
2.0000 g | Freq: Once | INTRAVENOUS | Status: AC
  Administered 2019-08-29: 2 g via INTRAVENOUS
  Filled 2019-08-29: qty 50

## 2019-08-29 MED ORDER — PANTOPRAZOLE SODIUM 40 MG PO TBEC
40.0000 mg | DELAYED_RELEASE_TABLET | Freq: Every day | ORAL | Status: DC
  Administered 2019-08-30 – 2019-09-02 (×4): 40 mg via ORAL
  Filled 2019-08-29 (×5): qty 1

## 2019-08-29 MED ORDER — METOPROLOL TARTRATE 50 MG PO TABS
50.0000 mg | ORAL_TABLET | Freq: Two times a day (BID) | ORAL | Status: DC
  Administered 2019-08-29 (×2): 50 mg via ORAL
  Filled 2019-08-29 (×2): qty 1

## 2019-08-29 MED ORDER — TRAZODONE HCL 50 MG PO TABS
50.0000 mg | ORAL_TABLET | Freq: Every evening | ORAL | Status: DC | PRN
  Administered 2019-08-29: 50 mg via ORAL
  Filled 2019-08-29: qty 1

## 2019-08-29 MED ORDER — FOLIC ACID 1 MG PO TABS
1.0000 mg | ORAL_TABLET | Freq: Every day | ORAL | Status: DC
  Administered 2019-08-30 – 2019-09-02 (×4): 1 mg via ORAL
  Filled 2019-08-29 (×4): qty 1

## 2019-08-29 MED ORDER — DILTIAZEM LOAD VIA INFUSION
10.0000 mg | Freq: Once | INTRAVENOUS | Status: AC
  Administered 2019-08-29: 10 mg via INTRAVENOUS
  Filled 2019-08-29: qty 10

## 2019-08-29 NOTE — Progress Notes (Addendum)
Pt sat up in the chair for approx. 8 hours. Tolerated well. No complaints. Will continue to monitor.

## 2019-08-29 NOTE — Progress Notes (Signed)
Pt A&O X 4 no tremors at this time. Pt HR has been in the 130's and 140's A-fib despite patient having q6 metoprolol pushes. Paged Dr. Retta Diones who ordered a load bouls infusion 10mg  of Cardizem and continuous Cardizem gtt.

## 2019-08-29 NOTE — Progress Notes (Signed)
Physical Therapy Treatment Patient Details Name: Kerry Adams MRN: [id number] DOB: 06-10-1955 Today's Date: 08/29/2019    History of Present Illness Kerry Adams is a 64 y.o. male with medical history of persistent atrial fibrillation, hypertension, alcohol dependence, and metastatic hepatocellular carcinoma to the lung, bone, and lymph nodes. He does drink quite a bit, 12 beers and 2 shots a day.  The patient was recently started on Tecentriq/Avastin -- start cycle #1 on 08/05/2019 for his hepatocellular carcinoma.  He also finished up radiation therapy to his right hip approximately 3 weeks before that.  He says that for the past 3 to 4 weeks he has experienced increasing generalized weakness and leg weakness.  He has had multiple falls.  In the past 2 to 3 days, the patient has had numerous falls the last being on 08/23/2019.  Since his falls, the patient has had increasing pain in his right hip and increasing difficulty with ambulation and getting up out of his chair.  He has been taking tramadol around-the-clock without significant help.  He has not had any fevers, chills, headache, chest pain, shortness breath, cough, hemoptysis, nausea, vomiting, diarrhea, abdominal pain.  Because of the increasing weakness and right hip pain, he presented for further evaluation.  He denies any dysuria, hematochezia, melena, hematuria.  He is compliant with all his medications.In the emergency department, the patient was afebrile and hemodynamically stable.  He was noted to have atrial fibrillation with RVR with a heart rate 120s.  The patient was given 2 IV pushes of metoprolol, 2.5, and then 5 mg.  Subsequently, diltiazem drip was started.  Patient was admitted for further evaluation and treatment.    PT Comments    Patient agreeable for therapy and his spouse present at bedside.  Patient has much difficulty maintaining standing balance due to shaking, poor carryover for holding onto RW when standing, tends  to lean on armrest of chair before getting close to chair resulting in loss of balance, required 3 attempts before transferring to chair and tolerated sitting up in chair to finish lunch with his spouse assisting.  Patient's spouse instructed to notified nursing staff when is ready to leave room with understanding acknowledged.  Patient will benefit from continued physical therapy in hospital and recommended venue below to increase strength, balance, endurance for safe ADLs and gait.   Follow Up Recommendations  SNF;Supervision for mobility/OOB;Supervision - Intermittent     Equipment Recommendations  None recommended by PT    Recommendations for Other Services       Precautions / Restrictions Precautions Precautions: Fall Restrictions Weight Bearing Restrictions: No    Mobility  Bed Mobility Overal bed mobility: Needs Assistance Bed Mobility: Supine to Sit     Supine to sit: Min guard;HOB elevated     General bed mobility comments: increased time labored movement  Transfers Overall transfer level: Needs assistance Equipment used: Rolling walker (2 wheeled) Transfers: Sit to/from [facility] Sit to Stand: Mod assist Stand pivot transfers: Mod assist;Max assist       General transfer comment: very unsteady and shaky requiring constant verbal cueing to hold onto RW during transfers, tends to reach for chair resulting in frequent loss of balance  Ambulation/Gait Ambulation/Gait assistance: Max assist Gait Distance (Feet): 3 Feet Assistive device: Rolling walker (2 wheeled) Gait Pattern/deviations: Decreased step length - right;Decreased step length - left;Decreased stride length Gait velocity: slow   General Gait Details: limited to 3-4 unsteady labored steps with near fall with poor  carryover using RW   Stairs             Wheelchair Mobility    Modified Rankin (Stroke Patients Only)       Balance Overall balance assessment: Needs  assistance Sitting-balance support: Feet supported;No upper extremity supported Sitting balance-Leahy Scale: Fair Sitting balance - Comments: fair/good seated at EOB   Standing balance support: During functional activity;Bilateral upper extremity supported Standing balance-Leahy Scale: Poor Standing balance comment: using RW                            Cognition Arousal/Alertness: Awake/alert Behavior During Therapy: WFL for tasks assessed/performed Overall Cognitive Status: Within Functional Limits for tasks assessed                                        Exercises General Exercises - Lower Extremity Long Arc Quad: Seated;AROM;Strengthening;Both;10 reps Hip Flexion/Marching: Seated;AROM;Strengthening;Both;10 reps Toe Raises: Seated;AROM;Strengthening;Both;10 reps Heel Raises: Seated;AROM;Strengthening;Both;10 reps    General Comments        Pertinent Vitals/Pain Pain Assessment: No/denies pain    Home Living                      Prior Function            PT Goals (current goals can now be found in the care plan section) Acute Rehab PT Goals Patient Stated Goal: return home with family to assist PT Goal Formulation: With patient Time For Goal Achievement: 09/09/19 Potential to Achieve Goals: Good Progress towards PT goals: Progressing toward goals    Frequency    Min 3X/week      PT Plan Current plan remains appropriate    Co-evaluation              AM-PAC PT "6 Clicks" Mobility   Outcome Measure  Help needed turning from your back to your side while in a flat bed without using bedrails?: None Help needed moving from lying on your back to sitting on the side of a flat bed without using bedrails?: A Little Help needed moving to and from a bed to a chair (including a wheelchair)?: A Lot Help needed standing up from a chair using your arms (e.g., wheelchair or bedside chair)?: A Lot Help needed to walk in hospital  room?: A Lot Help needed climbing 3-5 steps with a railing? : Total 6 Click Score: 14    End of Session   Activity Tolerance: Patient tolerated treatment well;Patient limited by fatigue Patient left: in chair;with call bell/phone within reach;with family/visitor present Nurse Communication: Mobility status PT Visit Diagnosis: Unsteadiness on feet (R26.81);Other abnormalities of gait and mobility (R26.89);Muscle weakness (generalized) (M62.81)     Time: 0932-3557 PT Time Calculation (min) (ACUTE ONLY): 28 min  Charges:  $Therapeutic Exercise: 8-22 mins $Therapeutic Activity: 8-22 mins                     3:09 PM, 08/29/19 Lonell Grandchild, MPT Physical Therapist with [facility] [phone] office [phone] mobile phone

## 2019-08-29 NOTE — Progress Notes (Signed)
PROGRESS NOTE  Kerry Adams JIR:[id number] DOB: 11-16-1955 DOA: 08/25/2019 PCP: Vicenta Aly, FNP  HPI/Recap of past 24 hours: 64 y.o.malewith medical history ofpersistent atrial fibrillation, hypertension, alcohol dependence, and metastatic hepatocellular carcinoma to the lung, bone, and lymph nodes.He does drink quite a bit, 12 beers and 2 shots a day.The patient was recently started onTecentriq/Avastin -- start cycle #1 on 06/11/2021for his hepatocellular carcinoma. He also finished up radiation therapy to his right hip approximately 3 weeks before that. He says that for the past 3 to 4 weeks he has experienced increasing generalized weakness and leg weakness. He has had multiple falls. In the past 2 to 3 days, the patient has had numerous falls the last being on 08/23/2019. Since his falls, the patient has had increasing pain in his right hip and increasing difficulty with ambulation and getting up out of his chair. He has been taking tramadol around-the-clock without significant help. He has not had any fevers, chills, headache, chest pain, shortness breath, cough, hemoptysis, nausea, vomiting, diarrhea, abdominal pain. Because of the increasing weakness and right hip pain, he presented for further evaluation. He denies any dysuria, hematochezia, melena, hematuria. He is compliant with all his medications. In the emergency department, the patient was afebrile and hemodynamically stable. He was noted to have atrial fibrillation with RVR with a heart rate 120s.The patient was given 2 IV pushes of metoprolol, 2.5, and then 5 mg. Subsequently, diltiazem drip was started. Patient was admitted for further evaluation and treatment.  On the evening of 08/26/2019, the patient became increasingly agitated requiring multiple doses of intravenous Ativan. After 8 mg of Ativan, the patient remained agitated, and he was subsequently placed on a Precedex drip.  Off Precedex since 08/28/2019.   08/29/19: Seen and examined, his wife present at his bedside.  Upper extremity tremors noted on exam, likely from alcohol withdrawal.  A. fib with RVR on the monitor with ongoing diltiazem drip.  He denies any chest pain, dyspnea or palpitations.   Assessment/Plan: Active Problems:   Hypertension   Other persistent atrial fibrillation (HCC)   Goals of care, counseling/discussion   Hepatocellular carcinoma metastatic to lymph nodes of multiple sites [facility])   Atrial fibrillation with RVR (HCC)   Delirium tremens (HCC)   Alcohol abuse with withdrawal   Atrial fibrillation (HCC)  Persistent atrial fibrillationwith RVR -CHADSVasc--2 -Currently on diltiazem drip>>po diltiazeminitially -Restart home Lopressor 50 mg twice daily -Continue rivaroxaban for CVA prevention -Echocardiogram--EF 45-50%, global HK, mild MR/trivial TR -TSH--3.265 -If still unable to control RVR may consider digoxin or amiodarone.  Alcohol abuse with concern for alcohol withdrawal -Precedex discontinued on 08/28/2019. -Calm this morning but with bilateral upper extremity tremors likely due to alcohol withdrawal -Start Valium 5 mg 3 times daily first day then 5 mg twice daily x2 days then DC. -Trazodone 50 mg nightly as needed for sleep. -Continue multivitamin, thiamine and folate.  Generalized weakness -Multifactorial including the patient's recent immunotherapy/chemotherapy, deconditioning, and atrial fibrillation with RVR -TSH--3.265 -Serum B51--025 -Folic ENID--7.8 -PT evaluation-->SNF-->pt and wife refuses -plan for d/c home with HHPT when stable  Uncontrolled pain -Patient has had increasing pain in the right hip area since his falls -08/25/2019 right hip x-ray--persistent destructive lesion in the greater trochanter of the proximal right femur with extension into the intertrochanteric region;no acute fracture -08/25/2019 CT brain--large round mass in the right lung apex presenting expansile bone lesion  in the right first rib -ContinueIV Dilaudid and oral oxycodone as needed for pain -PMP Aware  reviewed--pt was on tramadol prior to hospitalization  Improving hypovolemic hyponatremia likely multifactorial secondary to SIADH and alcohol use disorder -Continue to encourage oral intake -Continue to abstain from alcohol use  Metastatic hepatocellular carcinoma -Patient follows Dr. Marin Olp -He is status post palliative radiation to the right femur approximately 6 weeks prior to this admission -Tecentriq/Avastin -- startedcycle #1 on 08/05/2019  Essential hypertension Resume home Lopressor 50 mg twice daily  Improving thrombocytopenia likely multifactorial secondary to recent chemotherapy, alcohol use disorder, and hepatocellular carcinoma -Likely due to recent chemotherapy/immunotherapyin the setting of chronic alcohol use -No overt bleeding -Platelet count up trending, 124K  Hypomagnesemia -replete as indicated  GERD Continue PPI  Goals of care -08/27/2019--goals of care discussion with the patient's spouse -Continue to treat the treatable -Changed to DNR confirmed with spouse   Status is: inpatient  Patient will need inpatient due to ongoing treatment for A. fib with RVR and active alcohol withdrawal.  Dispo: The patient is from:Home Anticipated d/c is to:SNF Anticipated d/c date is: 08/30/2019. Patient currently is not medically stable to d/c.    The patient is critically ill with multiple organ systems failure and requires high complexity decision making for assessment and support, frequent evaluation and titration of therapies, application of advanced monitoring technologies and extensive interpretation of multiple databases.  Critical care time -35 mins.     Family Communication:Spouse updatedat bedside 08/29/2019.  Consultants:palliative care team.  Code Status: DNR.  DVT Prophylaxis:xarelto    Procedures: As Listed in Progress Note Above  Antibiotics: None         Objective: Vitals:   08/29/19 0815 08/29/19 0830 08/29/19 0845 08/29/19 0900  BP: (!) 146/101 124/85 126/82 103/81  Pulse: (!) 59 87 (!) 114 (!) 55  Resp: (!) 24 (!) 22 (!) 21 (!) 22  Temp:      TempSrc:      SpO2: 99% 97% 97% 96%  Weight:      Height:        Intake/Output Summary (Last 24 hours) at 08/29/2019 0909 Last data filed at 08/29/2019 0836 Gross per 24 hour  Intake 330.05 ml  Output 2700 ml  Net -2369.95 ml   Filed Weights   08/27/19 0600 08/28/19 0330 08/29/19 0500  Weight: 93.2 kg 92.2 kg 90.9 kg    Exam:  . General: 64 y.o. year-old male well developed well nourished in no acute distress.  Alert and oriented x3.  Tremors noted in upper extremities bilaterally. . Cardiovascular: Regular rate and rhythm with no rubs or gallops.  No thyromegaly or JVD noted.   Marland Kitchen Respiratory: Clear to auscultation with no wheezes or rales. Good inspiratory effort. . Abdomen: Soft nontender nondistended with normal bowel sounds x4 quadrants. . Musculoskeletal: No lower extremity edema. Marland Kitchen Psychiatry: Mood is appropriate for condition and setting   Data Reviewed: CBC: Recent Labs  Lab 08/25/19 1023 08/26/19 0433 08/27/19 0329 08/28/19 0452 08/29/19 0423  WBC 7.8 5.7 5.2 7.1 7.9  NEUTROABS 5.7  --   --   --   --   HGB 12.5* 11.7* 11.0* 12.9* 12.5*  HCT 39.0 37.1* 34.9* 41.1 38.6*  MCV 100.8* 102.8* 102.6* 102.8* 100.3*  PLT 109* 114* 107* 115* 818*   Basic Metabolic Panel: Recent Labs  Lab 08/25/19 1023 08/26/19 0433 08/27/19 0329 08/28/19 0452 08/29/19 0423  NA 129* 131* 131* 130* 132*  K 4.6 4.6 4.4 4.7 4.0  CL 99 99 101 99 99  CO2 22 24 26 22 23   GLUCOSE 108*  100* 104* 110* 112*  BUN 8 11 9 11 10   CREATININE 0.50* 0.46* 0.48* 0.53* 0.59*  CALCIUM 7.8* 7.7* 7.6* 7.9* 8.1*  MG  --  1.6* 1.6* 1.6* 1.7  PHOS 2.9  --   --   --   --    GFR: Estimated Creatinine Clearance:  104 mL/min (A) (by C-G formula based on SCr of 0.59 mg/dL (L)). Liver Function Tests: Recent Labs  Lab 08/25/19 1023 08/26/19 0433 08/29/19 0423  AST 72* 70* 81*  ALT 34 32 36  ALKPHOS 119 108 118  BILITOT 2.6* 2.7* 2.4*  PROT 6.0* 5.2* 5.5*  ALBUMIN 2.5* 2.2* 2.1*   No results for input(s): LIPASE, AMYLASE in the last 168 hours. No results for input(s): AMMONIA in the last 168 hours. Coagulation Profile: No results for input(s): INR, PROTIME in the last 168 hours. Cardiac Enzymes: No results for input(s): CKTOTAL, CKMB, CKMBINDEX, TROPONINI in the last 168 hours. BNP (last 3 results) No results for input(s): PROBNP in the last 8760 hours. HbA1C: No results for input(s): HGBA1C in the last 72 hours. CBG: Recent Labs  Lab 08/25/19 0947  GLUCAP 106*   Lipid Profile: No results for input(s): CHOL, HDL, LDLCALC, TRIG, CHOLHDL, LDLDIRECT in the last 72 hours. Thyroid Function Tests: No results for input(s): TSH, T4TOTAL, FREET4, T3FREE, THYROIDAB in the last 72 hours. Anemia Panel: No results for input(s): VITAMINB12, FOLATE, FERRITIN, TIBC, IRON, RETICCTPCT in the last 72 hours. Urine analysis:    Component Value Date/Time   COLORURINE AMBER (A) 08/25/2019 1134   APPEARANCEUR CLEAR 08/25/2019 1134   LABSPEC 1.014 08/25/2019 1134   PHURINE 6.0 08/25/2019 1134   GLUCOSEU NEGATIVE 08/25/2019 1134   HGBUR NEGATIVE 08/25/2019 1134   BILIRUBINUR SMALL (A) 08/25/2019 1134   KETONESUR NEGATIVE 08/25/2019 1134   PROTEINUR NEGATIVE 08/25/2019 1134   UROBILINOGEN 0.2 02/18/2014 2043   NITRITE NEGATIVE 08/25/2019 1134   LEUKOCYTESUR NEGATIVE 08/25/2019 1134   Sepsis Labs: @LABRCNTIP (procalcitonin:4,lacticidven:4)  ) Recent Results (from the past 240 hour(s))  SARS Coronavirus 2 by RT PCR (hospital order, performed in [place] [facility] [place] hospital lab) Nasopharyngeal Nasopharyngeal Swab     Status: None   Collection Time: 08/25/19  2:35 PM   Specimen: Nasopharyngeal Swab  Result  Value Ref Range Status   SARS Coronavirus 2 NEGATIVE NEGATIVE Final    Comment: (NOTE) SARS-CoV-2 target nucleic acids are NOT DETECTED.  The SARS-CoV-2 RNA is generally detectable in upper and lower respiratory specimens during the acute phase of infection. The lowest concentration of SARS-CoV-2 viral copies this assay can detect is 250 copies / mL. A negative result does not preclude SARS-CoV-2 infection and should not be used as the sole basis for treatment or other patient management decisions.  A negative result may occur with improper specimen collection / handling, submission of specimen other than nasopharyngeal swab, presence of viral mutation(s) within the areas targeted by this assay, and inadequate number of viral copies (<250 copies / mL). A negative result must be combined with clinical observations, patient history, and epidemiological information.  Fact Sheet for Patients:   StrictlyIdeas.no  Fact Sheet for Healthcare Providers: BankingDealers.co.za  This test is not yet approved or  cleared by the [place] FDA and has been authorized for detection and/or diagnosis of SARS-CoV-2 by FDA under an Emergency Use Authorization (EUA).  This EUA will remain in effect (meaning this test can be used) for the duration of the COVID-19 declaration under Section 564(b)(1) of the Act, 21 U.S.C.  section 360bbb-3(b)(1), unless the authorization is terminated or revoked sooner.  Performed at [facility], [address], [place], [place] [address]   MRSA PCR Screening     Status: None   Collection Time: 08/25/19  4:40 PM   Specimen: Nasopharyngeal  Result Value Ref Range Status   MRSA by PCR NEGATIVE NEGATIVE Final    Comment:        The GeneXpert MRSA Assay (FDA approved for NASAL specimens only), is one component of a comprehensive MRSA colonization surveillance program. It is not intended to diagnose MRSA infection nor  to guide or monitor treatment for MRSA infections. Performed at [facility], [address] [place], [place] [address]       Studies: No results found.  Scheduled Meds: . Chlorhexidine Gluconate Cloth  6 each Topical Daily  . diazepam  5 mg Oral TID  . metoprolol tartrate  50 mg Oral BID  . oxybutynin  5 mg Oral TID  . pantoprazole (PROTONIX) IV  40 mg Intravenous Q12H  . polyethylene glycol  17 g Oral Daily  . rivaroxaban  20 mg Oral Q supper  . senna  2 tablet Oral Daily  . thiamine  100 mg Oral Daily   Or  . thiamine  100 mg Intravenous Daily    Continuous Infusions: . dexmedetomidine (PRECEDEX) IV infusion Stopped (08/28/19 1025)  . diltiazem (CARDIZEM) infusion 10 mg/hr (08/29/19 0836)  . folic acid (FOLVITE) IVPB 1 mg (08/29/19 0859)     LOS: 3 days     Kayleen Memos, MD [facility] Pager [phone]  If 7PM-7AM, please contact night-coverage [email or web address] Password [email or web address] 08/29/2019, 9:09 AM

## 2019-08-30 LAB — COMPREHENSIVE METABOLIC PANEL
ALT: 39 U/L (ref 0–44)
AST: 76 U/L — ABNORMAL HIGH (ref 15–41)
Albumin: 2.2 g/dL — ABNORMAL LOW (ref 3.5–5.0)
Alkaline Phosphatase: 118 U/L (ref 38–126)
Anion gap: 9 (ref 5–15)
BUN: 11 mg/dL (ref 8–23)
CO2: 23 mmol/L (ref 22–32)
Calcium: 7.8 mg/dL — ABNORMAL LOW (ref 8.9–10.3)
Chloride: 100 mmol/L (ref 98–111)
Creatinine, Ser: 0.56 mg/dL — ABNORMAL LOW (ref 0.61–1.24)
GFR calc Af Amer: >60 mL/min (ref >60)
GFR calc non Af Amer: >60 mL/min (ref >60)
Glucose, Bld: 112 mg/dL — ABNORMAL HIGH (ref 70–99)
Potassium: 4.1 mmol/L (ref 3.5–5.1)
Sodium: 132 mmol/L — ABNORMAL LOW (ref 135–145)
Total Bilirubin: 2.6 mg/dL — ABNORMAL HIGH (ref 0.3–1.2)
Total Protein: 5.6 g/dL — ABNORMAL LOW (ref 6.5–8.1)

## 2019-08-30 LAB — CBC
HCT: 36.7 % — ABNORMAL LOW (ref 39.0–52.0)
Hemoglobin: 11.8 g/dL — ABNORMAL LOW (ref 13.0–17.0)
MCH: 32.6 pg (ref 26.0–34.0)
MCHC: 32.2 g/dL (ref 30.0–36.0)
MCV: 101.4 fL — ABNORMAL HIGH (ref 80.0–100.0)
Platelets: 141 10*3/uL — ABNORMAL LOW (ref 150–400)
RBC: 3.62 MIL/uL — ABNORMAL LOW (ref 4.22–5.81)
RDW: 18.3 % — ABNORMAL HIGH (ref 11.5–15.5)
WBC: 7.3 10*3/uL (ref 4.0–10.5)
nRBC: 0 % (ref 0.0–0.2)

## 2019-08-30 LAB — MAGNESIUM: Magnesium: 1.8 mg/dL (ref 1.7–2.4)

## 2019-08-30 LAB — PHOSPHORUS: Phosphorus: 2.9 mg/dL (ref 2.5–4.6)

## 2019-08-30 MED ORDER — METOPROLOL TARTRATE 25 MG PO TABS
12.5000 mg | ORAL_TABLET | Freq: Two times a day (BID) | ORAL | Status: DC
  Administered 2019-08-30: 12.5 mg via ORAL
  Filled 2019-08-30: qty 1

## 2019-08-30 MED ORDER — METOPROLOL TARTRATE 25 MG PO TABS
12.5000 mg | ORAL_TABLET | Freq: Once | ORAL | Status: AC
  Administered 2019-08-30: 12.5 mg via ORAL

## 2019-08-30 MED ORDER — METOPROLOL TARTRATE 25 MG PO TABS
25.0000 mg | ORAL_TABLET | Freq: Two times a day (BID) | ORAL | Status: DC
  Administered 2019-08-30: 25 mg via ORAL
  Filled 2019-08-30: qty 1

## 2019-08-30 NOTE — Progress Notes (Signed)
Pt has told staff that he is "going home to drink some liquor, I dont care what the doctor said."

## 2019-08-30 NOTE — Progress Notes (Signed)
Pt more confused now. Very impulsive. He will follow commands and is able to be reoriented. Pt is having sweats and tremors. Bed changed condom catheter changed CHG bath done.

## 2019-08-30 NOTE — Progress Notes (Signed)
PROGRESS NOTE  Kerry Adams EYC:[id number] DOB: 29-Dec-1955 DOA: 08/25/2019 PCP: Vicenta Aly, FNP  HPI/Recap of past 24 hours: 64 y.o.malewith medical history ofpersistent atrial fibrillation, hypertension, alcohol dependence, and metastatic hepatocellular carcinoma to the lung, bone, and lymph nodes.He does drink quite a bit, 12 beers and 2 shots a day.The patient was recently started onTecentriq/Avastin -- start cycle #1 on 06/11/2021for his hepatocellular carcinoma. He also finished up radiation therapy to his right hip approximately 3 weeks before that. He says that for the past 3 to 4 weeks he has experienced increasing generalized weakness and leg weakness. He has had multiple falls. In the past 2 to 3 days, the patient has had numerous falls the last being on 08/23/2019. Since his falls, the patient has had increasing pain in his right hip and increasing difficulty with ambulation and getting up out of his chair. He has been taking tramadol around-the-clock without significant help. He has not had any fevers, chills, headache, chest pain, shortness breath, cough, hemoptysis, nausea, vomiting, diarrhea, abdominal pain. Because of the increasing weakness and right hip pain, he presented for further evaluation. He denies any dysuria, hematochezia, melena, hematuria. He is compliant with all his medications. In the emergency department, the patient was afebrile and hemodynamically stable. He was noted to have atrial fibrillation with RVR with a heart rate 120s.The patient was given 2 IV pushes of metoprolol, 2.5, and then 5 mg. Subsequently, diltiazem drip was started. Patient was admitted for further evaluation and treatment.  On the evening of 08/26/2019, the patient became increasingly agitated requiring multiple doses of intravenous Ativan. After 8 mg of Ativan, the patient remained agitated, and he was subsequently placed on a Precedex drip.  Off Precedex since  08/28/2019.  08/30/19: Seen and examined at his bedside.  Reports visual hallucination likely secondary to alcohol withdrawal.  Started on Valium on 08/29/2019 with plan to slowly wean off.  Patient is interested in sobriety with his wife support.   Assessment/Plan: Active Problems:   Hypertension   Other persistent atrial fibrillation (HCC)   Goals of care, counseling/discussion   Hepatocellular carcinoma metastatic to lymph nodes of multiple sites [facility])   Atrial fibrillation with RVR (HCC)   Delirium tremens (HCC)   Alcohol abuse with withdrawal   Atrial fibrillation (HCC)  Permanent atrial fibrillationwith RVR -Rate is improving -CHADSVasc--2 -Weaning off Cardizem drip -Titrating up home Lopressor 25 mg twice daily as blood pressure tolerates, while weaning off the Cardizem drip. -Patient was on Lopressor 50 mg twice daily at home. -Continue Xarelto for CVA prevention -Echocardiogram--EF 45-50%, global HK, mild MR/trivial TR -TSH--3.265  Alcohol abuse with concern for alcohol withdrawal -Precedex discontinued on 08/28/2019. -Calm this morning, tremors improving on Valium.  -Started Valium 5 mg 3 times daily on 08/29/2019, wean off as tolerated.  -Continue trazodone 50 mg nightly as needed for sleep.  -Continue multivitamin, thiamine and folate.   Visual hallucination suspect secondary to alcohol withdrawal. Management as stated above  Generalized weakness -Multifactorial including the patient's recent immunotherapy/chemotherapy, deconditioning, and atrial fibrillation with RVR -TSH--3.265 -Serum J49--702 -Folic OVZC--5.8 -PT evaluation-->SNF-->pt and wife declined -plan for d/c home with HHPT when stable likely in the next 24 to 48 hours.  Uncontrolled pain -Patient has had increasing pain in the right hip area since his falls -08/25/2019 right hip x-ray--persistent destructive lesion in the greater trochanter of the proximal right femur with extension into the  intertrochanteric region;no acute fracture -ContinueIV Dilaudid as needed for severe pain and oral  oxycodone as needed for moderate pain -Continue bowel regimen while on narcotics  Improving hypovolemic hyponatremia likely multifactorial secondary to SIADH and alcohol use disorder -Continue to encourage oral intake -Continue to abstain from alcohol use  Metastatic hepatocellular carcinoma -Patient follows with Dr. Marin Olp -He is status post palliative radiation to the right femur approximately 6 weeks prior to this admission -Tecentriq/Avastin -- startedcycle #1 on 08/05/2019  Essential hypertension Blood pressure is at goal Continue Lopressor, currently at 25 mg twice daily  Improving thrombocytopenia likely multifactorial secondary to recent chemotherapy, alcohol use disorder, and hepatocellular carcinoma -Likely due to recent chemotherapy/immunotherapyin the setting of chronic alcohol use -No overt bleeding -Platelet count up trending, 124K> 141K  Resolved post repletion: Hypomagnesemia -Serum magnesium 1.8.  GERD Stable Continue PPI  Goals of care -08/27/2019--goals of care discussion with the patient's spouse -Continue to treat the treatable -Changed to DNR confirmed with spouse   Status is: inpatient  Patient will need inpatient due to ongoing treatment for A. fib with RVR and active alcohol withdrawal.  Dispo: The patient is from:Home Anticipated d/c is to: Home with home health services Anticipated d/c date is: 08/31/2019. Patient currently is not medically stable to d/c.  He is still on Cardizem drip, weaning off.    Family Communication:Spouse updatedat bedside 08/30/2019.  Consultants:palliative care team.  Code Status: DNR.  DVT Prophylaxis:xarelto   Procedures: As Listed in Progress Note Above  Antibiotics: None         Objective: Vitals:   08/30/19 0530 08/30/19 0600  08/30/19 0700 08/30/19 1000  BP: 120/79 97/84 122/82 (!) 106/59  Pulse:   (!) 33   Resp: (!) 27 (!) 22 (!) 23 20  Temp:      TempSrc:      SpO2:   (!) 87%   Weight:      Height:        Intake/Output Summary (Last 24 hours) at 08/30/2019 1107 Last data filed at 08/30/2019 1058 Gross per 24 hour  Intake 276.37 ml  Output 2150 ml  Net -1873.63 ml   Filed Weights   08/28/19 0330 08/29/19 0500 08/30/19 0500  Weight: 92.2 kg 90.9 kg 90 kg    Exam:  . General: 64 y.o. year-old male pleasant, well-developed well-nourished in no acute distress.  Alert and interactive.   . Cardiovascular: Irregular rate and rhythm no rubs or gallops.   Marland Kitchen Respiratory: Clear to auscultation no wheezes or rales. . Abdomen: Soft nontender normal bowel sounds present.   . Musculoskeletal: No lower extremity edema bilaterally.   Marland Kitchen Psychiatry: Mood is appropriate for condition and setting.   Data Reviewed: CBC: Recent Labs  Lab 08/25/19 1023 08/25/19 1023 08/26/19 0433 08/27/19 0329 08/28/19 0452 08/29/19 0423 08/30/19 0515  WBC 7.8   < > 5.7 5.2 7.1 7.9 7.3  NEUTROABS 5.7  --   --   --   --   --   --   HGB 12.5*   < > 11.7* 11.0* 12.9* 12.5* 11.8*  HCT 39.0   < > 37.1* 34.9* 41.1 38.6* 36.7*  MCV 100.8*   < > 102.8* 102.6* 102.8* 100.3* 101.4*  PLT 109*   < > 114* 107* 115* 124* 141*   < > = values in this interval not displayed.   Basic Metabolic Panel: Recent Labs  Lab 08/25/19 1023 08/25/19 1023 08/26/19 0433 08/27/19 0329 08/28/19 0452 08/29/19 0423 08/30/19 0515  NA 129*   < > 131* 131* 130* 132* 132*  K 4.6   < >  4.6 4.4 4.7 4.0 4.1  CL 99   < > 99 101 99 99 100  CO2 22   < > 24 26 22 23 23   GLUCOSE 108*   < > 100* 104* 110* 112* 112*  BUN 8   < > 11 9 11 10 11   CREATININE 0.50*   < > 0.46* 0.48* 0.53* 0.59* 0.56*  CALCIUM 7.8*   < > 7.7* 7.6* 7.9* 8.1* 7.8*  MG  --   --  1.6* 1.6* 1.6* 1.7 1.8  PHOS 2.9  --   --   --   --   --  2.9   < > = values in this interval not  displayed.   GFR: Estimated Creatinine Clearance: 104 mL/min (A) (by C-G formula based on SCr of 0.56 mg/dL (L)). Liver Function Tests: Recent Labs  Lab 08/25/19 1023 08/26/19 0433 08/29/19 0423 08/30/19 0515  AST 72* 70* 81* 76*  ALT 34 32 36 39  ALKPHOS 119 108 118 118  BILITOT 2.6* 2.7* 2.4* 2.6*  PROT 6.0* 5.2* 5.5* 5.6*  ALBUMIN 2.5* 2.2* 2.1* 2.2*   No results for input(s): LIPASE, AMYLASE in the last 168 hours. No results for input(s): AMMONIA in the last 168 hours. Coagulation Profile: No results for input(s): INR, PROTIME in the last 168 hours. Cardiac Enzymes: No results for input(s): CKTOTAL, CKMB, CKMBINDEX, TROPONINI in the last 168 hours. BNP (last 3 results) No results for input(s): PROBNP in the last 8760 hours. HbA1C: No results for input(s): HGBA1C in the last 72 hours. CBG: Recent Labs  Lab 08/25/19 0947  GLUCAP 106*   Lipid Profile: No results for input(s): CHOL, HDL, LDLCALC, TRIG, CHOLHDL, LDLDIRECT in the last 72 hours. Thyroid Function Tests: No results for input(s): TSH, T4TOTAL, FREET4, T3FREE, THYROIDAB in the last 72 hours. Anemia Panel: No results for input(s): VITAMINB12, FOLATE, FERRITIN, TIBC, IRON, RETICCTPCT in the last 72 hours. Urine analysis:    Component Value Date/Time   COLORURINE AMBER (A) 08/25/2019 1134   APPEARANCEUR CLEAR 08/25/2019 1134   LABSPEC 1.014 08/25/2019 1134   PHURINE 6.0 08/25/2019 1134   GLUCOSEU NEGATIVE 08/25/2019 1134   HGBUR NEGATIVE 08/25/2019 1134   BILIRUBINUR SMALL (A) 08/25/2019 1134   KETONESUR NEGATIVE 08/25/2019 1134   PROTEINUR NEGATIVE 08/25/2019 1134   UROBILINOGEN 0.2 02/18/2014 2043   NITRITE NEGATIVE 08/25/2019 1134   LEUKOCYTESUR NEGATIVE 08/25/2019 1134   Sepsis Labs: @LABRCNTIP (procalcitonin:4,lacticidven:4)  ) Recent Results (from the past 240 hour(s))  SARS Coronavirus 2 by RT PCR (hospital order, performed in [place] hospital lab) Nasopharyngeal Nasopharyngeal Swab      Status: None   Collection Time: 08/25/19  2:35 PM   Specimen: Nasopharyngeal Swab  Result Value Ref Range Status   SARS Coronavirus 2 NEGATIVE NEGATIVE Final    Comment: (NOTE) SARS-CoV-2 target nucleic acids are NOT DETECTED.  The SARS-CoV-2 RNA is generally detectable in upper and lower respiratory specimens during the acute phase of infection. The lowest concentration of SARS-CoV-2 viral copies this assay can detect is 250 copies / mL. A negative result does not preclude SARS-CoV-2 infection and should not be used as the sole basis for treatment or other patient management decisions.  A negative result may occur with improper specimen collection / handling, submission of specimen other than nasopharyngeal swab, presence of viral mutation(s) within the areas targeted by this assay, and inadequate number of viral copies (<250 copies / mL). A negative result must be combined with clinical observations, patient history,  and epidemiological information.  Fact Sheet for Patients:   StrictlyIdeas.no  Fact Sheet for Healthcare Providers: BankingDealers.co.za  This test is not yet approved or  cleared by the [place] FDA and has been authorized for detection and/or diagnosis of SARS-CoV-2 by FDA under an Emergency Use Authorization (EUA).  This EUA will remain in effect (meaning this test can be used) for the duration of the COVID-19 declaration under Section 564(b)(1) of the Act, 21 U.S.C. section 360bbb-3(b)(1), unless the authorization is terminated or revoked sooner.  Performed at [facility], [address] [place], [place] [address]   MRSA PCR Screening     Status: None   Collection Time: 08/25/19  4:40 PM   Specimen: Nasopharyngeal  Result Value Ref Range Status   MRSA by PCR NEGATIVE NEGATIVE Final    Comment:        The GeneXpert MRSA Assay (FDA approved for NASAL specimens only), is one component of a comprehensive  MRSA colonization surveillance program. It is not intended to diagnose MRSA infection nor to guide or monitor treatment for MRSA infections. Performed at [facility], [address], [place],  [address]       Studies: No results found.  Scheduled Meds: . Chlorhexidine Gluconate Cloth  6 each Topical Daily  . diazepam  5 mg Oral TID  . folic acid  1 mg Oral Daily  . metoprolol tartrate  12.5 mg Oral Once  . metoprolol tartrate  25 mg Oral BID  . multivitamin with minerals  1 tablet Oral Daily  . oxybutynin  5 mg Oral TID  . pantoprazole  40 mg Oral Daily  . polyethylene glycol  17 g Oral Daily  . rivaroxaban  20 mg Oral Q supper  . senna  2 tablet Oral Daily  . thiamine  100 mg Oral Daily   Or  . thiamine  100 mg Intravenous Daily    Continuous Infusions: . diltiazem (CARDIZEM) infusion 5 mg/hr (08/30/19 1058)     LOS: 4 days     Kayleen Memos, MD [facility] Pager [phone]  If 7PM-7AM, please contact night-coverage [email or web address] Password [email or web address] 08/30/2019, 11:07 AM

## 2019-08-31 MED ORDER — METOPROLOL TARTRATE 50 MG PO TABS
75.0000 mg | ORAL_TABLET | Freq: Two times a day (BID) | ORAL | Status: DC
  Administered 2019-08-31: 75 mg via ORAL
  Filled 2019-08-31: qty 1

## 2019-08-31 MED ORDER — METOPROLOL TARTRATE 50 MG PO TABS
50.0000 mg | ORAL_TABLET | Freq: Two times a day (BID) | ORAL | Status: DC
  Administered 2019-08-31: 50 mg via ORAL
  Filled 2019-08-31: qty 1

## 2019-08-31 NOTE — TOC Progression Note (Signed)
Transition of Care [facility]) - Progression Note    Patient Details  Name: PESACH FRISCH MRN: [id number] Date of Birth: 1956-01-22  [facility]) CM/SW Contact  Shade Flood, LCSW Phone Number: 08/31/2019, 10:56 AM  Clinical Narrative:     Pt status reviewed with MD in Progression today. MD states pt is not yet stable for dc. He is hopeful for dc tomorrow. Updated [place] at [facility] and Barbaraann Rondo at [place] and requested follow up with pt's wife today.  Met with pt's wife at bedside to answer questions and review dc planning.  Will follow up tomorrow.  Expected Discharge Plan: [place] Barriers to Discharge: Continued Medical Work up  Expected Discharge Plan and Services Expected Discharge Plan: [place] In-house Referral: Clinical Social Work   Post Acute Care Choice: [place] arrangements for the past 2 months: [place]: RN, PT, Nurse's Aide [place] Agency: [place] ([facility]) Date HH Agency Contacted: 08/26/19 Time [place]: [place] Representative spoke with at [place]: [place] Determinants of Health ([place]) Interventions    Readmission Risk Interventions No flowsheet data found.

## 2019-08-31 NOTE — Progress Notes (Signed)
Physical Therapy Treatment Patient Details Name: Kerry Adams MRN: [id number] DOB: 1955-03-25 Today's Date: 08/31/2019    History of Present Illness Kerry Adams is a 64 y.o. male with medical history of persistent atrial fibrillation, hypertension, alcohol dependence, and metastatic hepatocellular carcinoma to the lung, bone, and lymph nodes. He does drink quite a bit, 12 beers and 2 shots a day.  The patient was recently started on Tecentriq/Avastin -- start cycle #1 on 08/05/2019 for his hepatocellular carcinoma.  He also finished up radiation therapy to his right hip approximately 3 weeks before that.  He says that for the past 3 to 4 weeks he has experienced increasing generalized weakness and leg weakness.  He has had multiple falls.  In the past 2 to 3 days, the patient has had numerous falls the last being on 08/23/2019.  Since his falls, the patient has had increasing pain in his right hip and increasing difficulty with ambulation and getting up out of his chair.  He has been taking tramadol around-the-clock without significant help.  He has not had any fevers, chills, headache, chest pain, shortness breath, cough, hemoptysis, nausea, vomiting, diarrhea, abdominal pain.  Because of the increasing weakness and right hip pain, he presented for further evaluation.  He denies any dysuria, hematochezia, melena, hematuria.  He is compliant with all his medications.In the emergency department, the patient was afebrile and hemodynamically stable.  He was noted to have atrial fibrillation with RVR with a heart rate 120s.  The patient was given 2 IV pushes of metoprolol, 2.5, and then 5 mg.  Subsequently, diltiazem drip was started.  Patient was admitted for further evaluation and treatment.    PT Comments    Patient demonstrates fair/good return for sitting up with bed flat with labored movement and tactile cueing at shoulders, slightly less shaky today and able to transfer using RW, but limited to a  few steps at bedside due to fall risk.  Patient tolerated sitting up in chair after therapy with his spouse present to assist him with eating lunch - RN notified.  Patient will benefit from continued physical therapy in hospital and recommended venue below to increase strength, balance, endurance for safe ADLs and gait.    Follow Up Recommendations  SNF;Supervision for mobility/OOB;Supervision - Intermittent     Equipment Recommendations  None recommended by PT    Recommendations for Other Services       Precautions / Restrictions Precautions Precautions: Fall Restrictions Weight Bearing Restrictions: No    Mobility  Bed Mobility Overal bed mobility: Needs Assistance Bed Mobility: Supine to Sit     Supine to sit: Min assist     General bed mobility comments: increased time, labored movement with bed flat  Transfers Overall transfer level: Needs assistance Equipment used: Rolling walker (2 wheeled) Transfers: Sit to/from [facility] Sit to Stand: Min assist;Mod assist Stand pivot transfers: Mod assist       General transfer comment: slow labored shaky movement, with verbal cues to hold onto RW instead of reaching for chair with fair/good carryover  Ambulation/Gait Ambulation/Gait assistance: Mod assist Gait Distance (Feet): 5 Feet Assistive device: Rolling walker (2 wheeled) Gait Pattern/deviations: Decreased step length - right;Decreased step length - left;Decreased stride length Gait velocity: slow   General Gait Details: limited to 4-5 slow unsteady shaky steps at bedside, limited secondary to fatigue and poor standing balance   Stairs             Wheelchair Mobility  Modified Rankin (Stroke Patients Only)       Balance Overall balance assessment: Needs assistance Sitting-balance support: Feet supported;No upper extremity supported Sitting balance-Leahy Scale: Good Sitting balance - Comments: seated at EOB   Standing balance  support: During functional activity;Bilateral upper extremity supported Standing balance-Leahy Scale: Poor Standing balance comment: fair/poor using RW                            Cognition Arousal/Alertness: Awake/alert Behavior During Therapy: WFL for tasks assessed/performed Overall Cognitive Status: Within Functional Limits for tasks assessed                                        Exercises General Exercises - Lower Extremity Long Arc Quad: Seated;AROM;Strengthening;Both;10 reps Hip Flexion/Marching: Seated;AROM;Strengthening;Both;10 reps Toe Raises: Seated;AROM;Strengthening;Both;10 reps Heel Raises: Seated;AROM;Strengthening;Both;10 reps    General Comments        Pertinent Vitals/Pain Pain Assessment: No/denies pain    Home Living                      Prior Function            PT Goals (current goals can now be found in the care plan section) Acute Rehab PT Goals Patient Stated Goal: return home with family to assist PT Goal Formulation: With patient/family Time For Goal Achievement: 09/09/19 Potential to Achieve Goals: Good Progress towards PT goals: Progressing toward goals    Frequency    Min 3X/week      PT Plan Current plan remains appropriate    Co-evaluation              AM-PAC PT "6 Clicks" Mobility   Outcome Measure  Help needed turning from your back to your side while in a flat bed without using bedrails?: None Help needed moving from lying on your back to sitting on the side of a flat bed without using bedrails?: A Little Help needed moving to and from a bed to a chair (including a wheelchair)?: A Lot Help needed standing up from a chair using your arms (e.g., wheelchair or bedside chair)?: A Lot Help needed to walk in hospital room?: A Lot Help needed climbing 3-5 steps with a railing? : Total 6 Click Score: 14    End of Session   Activity Tolerance: Patient tolerated treatment well;Patient  limited by fatigue Patient left: in chair;with call bell/phone within reach;with family/visitor present Nurse Communication: Mobility status PT Visit Diagnosis: Unsteadiness on feet (R26.81);Other abnormalities of gait and mobility (R26.89);Muscle weakness (generalized) (M62.81)     Time: 2952-8413 PT Time Calculation (min) (ACUTE ONLY): 24 min  Charges:  $Therapeutic Exercise: 8-22 mins $Therapeutic Activity: 8-22 mins                     12:34 PM, 08/31/19 Lonell Grandchild, MPT Physical Therapist with [facility] [phone] office [phone] mobile phone

## 2019-08-31 NOTE — Progress Notes (Addendum)
PROGRESS NOTE  Kerry Adams JEH:[id number] DOB: 1955-08-27 DOA: 08/25/2019 PCP: Vicenta Aly, FNP  HPI/Recap of past 24 hours: 64 y.o.malewith medical history ofpersistent atrial fibrillation, hypertension, alcohol dependence, and metastatic hepatocellular carcinoma to the lung, bone, and lymph nodes.He does drink quite a bit, 12 beers and 2 shots a day.The patient was recently started onTecentriq/Avastin -- start cycle #1 on 06/11/2021for his hepatocellular carcinoma. He also finished up radiation therapy to his right hip approximately 3 weeks before that. He says that for the past 3 to 4 weeks he has experienced increasing generalized weakness and leg weakness. He has had multiple falls. In the past 2 to 3 days, the patient has had numerous falls the last being on 08/23/2019. Since his falls, the patient has had increasing pain in his right hip and increasing difficulty with ambulation and getting up out of his chair. He has been taking tramadol around-the-clock without significant help. He has not had any fevers, chills, headache, chest pain, shortness breath, cough, hemoptysis, nausea, vomiting, diarrhea, abdominal pain. Because of the increasing weakness and right hip pain, he presented for further evaluation. He denies any dysuria, hematochezia, melena, hematuria. He is compliant with all his medications. In the emergency department, the patient was afebrile and hemodynamically stable. He was noted to have atrial fibrillation with RVR with a heart rate 120s.The patient was given 2 IV pushes of metoprolol, 2.5, and then 5 mg. Subsequently, diltiazem drip was started. Patient was admitted for further evaluation and treatment.  On the evening of 08/26/2019, the patient became increasingly agitated requiring multiple doses of intravenous Ativan. After 8 mg of Ativan, the patient remained agitated, and he was subsequently placed on a Precedex drip.  Off Precedex since  08/28/2019.  08/31/19: Seen and examined at his bedside. Pt says he wants to try ambulating more today.   He wants to get home.    Assessment/Plan: Active Problems:   Hypertension   Other persistent atrial fibrillation (HCC)   Goals of care, counseling/discussion   Hepatocellular carcinoma metastatic to lymph nodes of multiple sites [facility])   Atrial fibrillation with RVR (HCC)   Delirium tremens (HCC)   Alcohol abuse with withdrawal   Atrial fibrillation (HCC)  Permanent atrial fibrillationwith RVR - Rate poorly controlled -Rate remains poorly controlled.  -CHADSVasc--2 -Weaning off Cardizem drip -Increased Lopressor to 75 mg twice daily with holding parameters.  -Patient was on Lopressor 50 mg twice daily at home. -Continue Xarelto for full anticoagulation CVA prevention -Echocardiogram--EF 45-50%, global HK, mild MR/trivial TR -TSH--3.265  Alcohol abuse with concern for alcohol withdrawal -Precedex discontinued on 08/28/2019. -Calm this morning, tremors improving on Valium.  -Started Valium 5 mg 3 times daily on 08/29/2019, wean off as tolerated.  -Continue trazodone 50 mg nightly as needed for sleep.  -Continue multivitamin, thiamine and folate.   Visual hallucination suspect secondary to alcohol withdrawal. Management as stated above  Generalized weakness -Multifactorial including the patient's recent immunotherapy/chemotherapy, deconditioning, and atrial fibrillation with RVR -TSH--3.265 -Serum V78--588 -Folic FOYD--7.4 -PT evaluation-->SNF-->pt and wife declined -plan for d/c home with HHPT when stable likely in the next 24 to 48 hours.  Uncontrolled pain -Patient has had increasing pain in the right hip area since his falls -08/25/2019 right hip x-ray--persistent destructive lesion in the greater trochanter of the proximal right femur with extension into the intertrochanteric region;no acute fracture -ContinueIV Dilaudid as needed for severe pain and oral oxycodone as  needed for moderate pain -Continue bowel regimen while on narcotics  Improving hypovolemic hyponatremia likely multifactorial secondary to SIADH and alcohol use disorder -Continue to encourage oral intake -Continue to abstain from alcohol use  Metastatic hepatocellular carcinoma -Patient follows with Dr. Marin Olp -He is status post palliative radiation to the right femur approximately 6 weeks prior to this admission -Tecentriq/Avastin -- startedcycle #1 on 08/05/2019  Essential hypertension Blood pressure is at goal Continue Lopressor, currently at 50 mg twice daily, increased further to 75 mg BID  Improving thrombocytopenia likely multifactorial secondary to recent chemotherapy, alcohol use disorder, and hepatocellular carcinoma -Likely due to recent chemotherapy/immunotherapyin the setting of chronic alcohol use -No overt bleeding -Platelet count up trending, 124K> 141K  Resolved post repletion: Hypomagnesemia -Serum magnesium 1.8.  GERD Stable Continue PPI  Goals of care -08/27/2019--goals of care discussion with the patient's spouse -Continue to treat the treatable -Changed to DNR confirmed with spouse  Status is: inpatient  Patient will need inpatient due to ongoing treatment for A. fib with RVR with uncontrolled rate, titrating metoprolol dose  Dispo: The patient is from:Home Anticipated d/c is to: Home with home health services Anticipated d/c date is: 09/01/2019. Patient currently is not medically stable to d/c.  Titrating metoprolol for better HR control.    Family Communication:Spouse updatedat bedside 08/31/2019.  Consultants:palliative care team.  Code Status: DNR.  DVT Prophylaxis:xarelto   Procedures: As Listed in Progress Note Above  Antibiotics: None   Objective: Vitals:   08/31/19 1116 08/31/19 1200 08/31/19 1500 08/31/19 1625  BP:  (!) 129/93    Pulse:      Resp: (!) 23  20  20   Temp: 98.3 F (36.8 C)   98.1 F (36.7 C)  TempSrc: Axillary   Axillary  SpO2:      Weight:      Height:        Intake/Output Summary (Last 24 hours) at 08/31/2019 1651 Last data filed at 08/31/2019 0500 Gross per 24 hour  Intake --  Output 1501 ml  Net -1501 ml   Filed Weights   08/29/19 0500 08/30/19 0500 08/31/19 0500  Weight: 90.9 kg 90 kg 90.2 kg    Exam:  . General: 64 y.o. year-old male pleasant, well-developed well-nourished in no acute distress.  Alert and interactive.   . Cardiovascular: tachycardic Irregular rate and rhythm no rubs or gallops.   Marland Kitchen Respiratory: Clear to auscultation no wheezes or rales. . Abdomen: Soft nontender normal bowel sounds present.   . Musculoskeletal: No lower extremity edema bilaterally.   Marland Kitchen Psychiatry: Mood is appropriate for condition and setting.   Data Reviewed: CBC: Recent Labs  Lab 08/25/19 1023 08/25/19 1023 08/26/19 0433 08/27/19 0329 08/28/19 0452 08/29/19 0423 08/30/19 0515  WBC 7.8   < > 5.7 5.2 7.1 7.9 7.3  NEUTROABS 5.7  --   --   --   --   --   --   HGB 12.5*   < > 11.7* 11.0* 12.9* 12.5* 11.8*  HCT 39.0   < > 37.1* 34.9* 41.1 38.6* 36.7*  MCV 100.8*   < > 102.8* 102.6* 102.8* 100.3* 101.4*  PLT 109*   < > 114* 107* 115* 124* 141*   < > = values in this interval not displayed.   Basic Metabolic Panel: Recent Labs  Lab 08/25/19 1023 08/25/19 1023 08/26/19 0433 08/27/19 0329 08/28/19 0452 08/29/19 0423 08/30/19 0515  NA 129*   < > 131* 131* 130* 132* 132*  K 4.6   < > 4.6 4.4 4.7 4.0 4.1  CL 99   < >  99 101 99 99 100  CO2 22   < > 24 26 22 23 23   GLUCOSE 108*   < > 100* 104* 110* 112* 112*  BUN 8   < > 11 9 11 10 11   CREATININE 0.50*   < > 0.46* 0.48* 0.53* 0.59* 0.56*  CALCIUM 7.8*   < > 7.7* 7.6* 7.9* 8.1* 7.8*  MG  --   --  1.6* 1.6* 1.6* 1.7 1.8  PHOS 2.9  --   --   --   --   --  2.9   < > = values in this interval not displayed.   GFR: Estimated Creatinine Clearance: 104 mL/min (A) (by C-G  formula based on SCr of 0.56 mg/dL (L)). Liver Function Tests: Recent Labs  Lab 08/25/19 1023 08/26/19 0433 08/29/19 0423 08/30/19 0515  AST 72* 70* 81* 76*  ALT 34 32 36 39  ALKPHOS 119 108 118 118  BILITOT 2.6* 2.7* 2.4* 2.6*  PROT 6.0* 5.2* 5.5* 5.6*  ALBUMIN 2.5* 2.2* 2.1* 2.2*   No results for input(s): LIPASE, AMYLASE in the last 168 hours. No results for input(s): AMMONIA in the last 168 hours. Coagulation Profile: No results for input(s): INR, PROTIME in the last 168 hours. Cardiac Enzymes: No results for input(s): CKTOTAL, CKMB, CKMBINDEX, TROPONINI in the last 168 hours. BNP (last 3 results) No results for input(s): PROBNP in the last 8760 hours. HbA1C: No results for input(s): HGBA1C in the last 72 hours. CBG: Recent Labs  Lab 08/25/19 0947  GLUCAP 106*   Lipid Profile: No results for input(s): CHOL, HDL, LDLCALC, TRIG, CHOLHDL, LDLDIRECT in the last 72 hours. Thyroid Function Tests: No results for input(s): TSH, T4TOTAL, FREET4, T3FREE, THYROIDAB in the last 72 hours. Anemia Panel: No results for input(s): VITAMINB12, FOLATE, FERRITIN, TIBC, IRON, RETICCTPCT in the last 72 hours. Urine analysis:    Component Value Date/Time   COLORURINE AMBER (A) 08/25/2019 1134   APPEARANCEUR CLEAR 08/25/2019 1134   LABSPEC 1.014 08/25/2019 1134   PHURINE 6.0 08/25/2019 1134   GLUCOSEU NEGATIVE 08/25/2019 1134   HGBUR NEGATIVE 08/25/2019 1134   BILIRUBINUR SMALL (A) 08/25/2019 1134   KETONESUR NEGATIVE 08/25/2019 1134   PROTEINUR NEGATIVE 08/25/2019 1134   UROBILINOGEN 0.2 02/18/2014 2043   NITRITE NEGATIVE 08/25/2019 1134   LEUKOCYTESUR NEGATIVE 08/25/2019 1134    Recent Results (from the past 240 hour(s))  SARS Coronavirus 2 by RT PCR (hospital order, performed in [facility] hospital lab) Nasopharyngeal Nasopharyngeal Swab     Status: None   Collection Time: 08/25/19  2:35 PM   Specimen: Nasopharyngeal Swab  Result Value Ref Range Status   SARS Coronavirus 2  NEGATIVE NEGATIVE Final    Comment: (NOTE) SARS-CoV-2 target nucleic acids are NOT DETECTED.  The SARS-CoV-2 RNA is generally detectable in upper and lower respiratory specimens during the acute phase of infection. The lowest concentration of SARS-CoV-2 viral copies this assay can detect is 250 copies / mL. A negative result does not preclude SARS-CoV-2 infection and should not be used as the sole basis for treatment or other patient management decisions.  A negative result may occur with improper specimen collection / handling, submission of specimen other than nasopharyngeal swab, presence of viral mutation(s) within the areas targeted by this assay, and inadequate number of viral copies (<250 copies / mL). A negative result must be combined with clinical observations, patient history, and epidemiological information.  Fact Sheet for Patients:   StrictlyIdeas.no  Fact Sheet for Healthcare  Providers: BankingDealers.co.za  This test is not yet approved or  cleared by the [place] and has been authorized for detection and/or diagnosis of SARS-CoV-2 by FDA under an Emergency Use Authorization (EUA).  This EUA will remain in effect (meaning this test can be used) for the duration of the COVID-19 declaration under Section 564(b)(1) of the Act, 21 U.S.C. section 360bbb-3(b)(1), unless the authorization is terminated or revoked sooner.  Performed at [facility], [address], [place], [place] [address]   MRSA PCR Screening     Status: None   Collection Time: 08/25/19  4:40 PM   Specimen: Nasopharyngeal  Result Value Ref Range Status   MRSA by PCR NEGATIVE NEGATIVE Final    Comment:        The GeneXpert MRSA Assay (FDA approved for NASAL specimens only), is one component of a comprehensive MRSA colonization surveillance program. It is not intended to diagnose MRSA infection nor to guide or monitor treatment for MRSA  infections. Performed at [facility], [address] [place], [place] [address]       Studies: No results found.  Scheduled Meds: . Chlorhexidine Gluconate Cloth  6 each Topical Daily  . diazepam  5 mg Oral TID  . folic acid  1 mg Oral Daily  . metoprolol tartrate  75 mg Oral BID  . multivitamin with minerals  1 tablet Oral Daily  . oxybutynin  5 mg Oral TID  . pantoprazole  40 mg Oral Daily  . polyethylene glycol  17 g Oral Daily  . rivaroxaban  20 mg Oral Q supper  . senna  2 tablet Oral Daily  . thiamine  100 mg Oral Daily   Or  . thiamine  100 mg Intravenous Daily   Continuous Infusions:   LOS: 5 days    Critical Care Procedure Note Authorized and Performed by: Murvin Natal MD  Total Critical Care time:  32 minutes Due to a high probability of clinically significant, life threatening deterioration, the patient required my highest level of preparedness to intervene emergently and I personally spent this critical care time directly and personally managing the patient.  This critical care time included obtaining a history; examining the patient, pulse oximetry; ordering and review of studies; arranging urgent treatment with development of a management plan; evaluation of patient's response of treatment; frequent reassessment; and discussions with other providers.  This critical care time was performed to assess and manage the high probability of imminent and life threatening deterioration that could result in multi-organ failure.  It was exclusive of separately billable procedures and treating other patients and teaching time.     Irwin Brakeman, MD [facility] How to contact the [facility] Attending or Consulting provider [place] or covering provider during after hours [place], for this patient?  1. Check the care team in [facility] and look for a) attending/consulting TR[facility] provider listed and b) the [facility] team listed 2. Log into [email or web address] and use [place]'s universal password  to access. If you do not have the password, please contact the hospital operator. 3. Locate the [facility] provider you are looking for under [facility] and page to a number that you can be directly reached. 4. If you still have difficulty reaching the provider, please page the [facility] (Director on Call) for the Hospitalists listed on amion for assistance.   If 7PM-7AM, please contact night-coverage [email or web address] Password [facility] 08/31/2019, 4:51 PM

## 2019-09-01 MED ORDER — DIAZEPAM 5 MG PO TABS
2.5000 mg | ORAL_TABLET | Freq: Three times a day (TID) | ORAL | Status: DC
  Administered 2019-09-01 – 2019-09-02 (×4): 2.5 mg via ORAL
  Filled 2019-09-01 (×4): qty 1

## 2019-09-01 MED ORDER — METOPROLOL TARTRATE 50 MG PO TABS
100.0000 mg | ORAL_TABLET | Freq: Two times a day (BID) | ORAL | Status: DC
  Administered 2019-09-01 – 2019-09-02 (×3): 100 mg via ORAL
  Filled 2019-09-01 (×3): qty 2

## 2019-09-01 NOTE — TOC Progression Note (Addendum)
Transition of Care [facility]) - Progression Note    Patient Details  Name: Kerry Adams MRN: [id number] Date of Birth: [birth year]  Transition of Care [facility]) CM/SW Contact  Shade Flood, LCSW Phone Number: 09/01/2019, 10:20 AM  Clinical Narrative:     Pt status discussed with MD in Progression today. Per MD, pt not yet stable for dc. He is adjusting pt's HR medication and is hopeful pt can dc tomorrow. Plan remains for dc home with [place] when stable.  Assigned TOC will follow.  Expected Discharge Plan: [place] Barriers to Discharge: Continued Medical Work up  Expected Discharge Plan and Services Expected Discharge Plan: [place] In-house Referral: Clinical Social Work   Post Acute Care Choice: [place] arrangements for the past 2 months: [place]: RN, PT, Nurse's Aide [place] Agency: [place] ([facility]) Date HH Agency Contacted: 08/26/19 Time [place]: [place] Representative spoke with at [place]: [place] Determinants of Health ([place]) Interventions    Readmission Risk Interventions No flowsheet data found.

## 2019-09-01 NOTE — Progress Notes (Addendum)
PROGRESS NOTE  Kerry Adams:[id number] DOB: 1955/10/29 DOA: 08/25/2019 PCP: Vicenta Aly, FNP  HPI/Recap of past 24 hours: 64 y.o.malewith medical history ofpersistent atrial fibrillation, hypertension, alcohol dependence, and metastatic hepatocellular carcinoma to the lung, bone, and lymph nodes.He does drink quite a bit, 12 beers and 2 shots a day.The patient was recently started onTecentriq/Avastin -- start cycle #1 on 06/11/2021for his hepatocellular carcinoma. He also finished up radiation therapy to his right hip approximately 3 weeks before that. He says that for the past 3 to 4 weeks he has experienced increasing generalized weakness and leg weakness. He has had multiple falls. In the past 2 to 3 days, the patient has had numerous falls the last being on 08/23/2019. Since his falls, the patient has had increasing pain in his right hip and increasing difficulty with ambulation and getting up out of his chair. He has been taking tramadol around-the-clock without significant help. He has not had any fevers, chills, headache, chest pain, shortness breath, cough, hemoptysis, nausea, vomiting, diarrhea, abdominal pain. Because of the increasing weakness and right hip pain, he presented for further evaluation. He denies any dysuria, hematochezia, melena, hematuria. He is compliant with all his medications. In the emergency department, the patient was afebrile and hemodynamically stable. He was noted to have atrial fibrillation with RVR with a heart rate 120s.The patient was given 2 IV pushes of metoprolol, 2.5, and then 5 mg. Subsequently, diltiazem drip was started. Patient was admitted for further evaluation and treatment.  On the evening of 08/26/2019, the patient became increasingly agitated requiring multiple doses of intravenous Ativan. After 8 mg of Ativan, the patient remained agitated, and he was subsequently placed on a Precedex drip.  Off Precedex since  08/28/2019.  09/01/19: Seen and examined at his bedside. Pt has elevated heart rate.  He feels weak because of it.  He has been working with PT but remains severely deconditioned, but adamant about going home and refusing SNF, wife agreeable to care for him at home with assistance from neighbors, family and friends.     Assessment/Plan: Active Problems:   Hypertension   Other persistent atrial fibrillation (HCC)   Goals of care, counseling/discussion   Hepatocellular carcinoma metastatic to lymph nodes of multiple sites [facility])   Atrial fibrillation with RVR (HCC)   Delirium tremens (HCC)   Alcohol abuse with withdrawal   Atrial fibrillation (HCC)  Permanent atrial fibrillationwith RVR - Rate poorly controlled -Rate remains poorly controlled.  -CHADSVasc--2 -Weaning off Cardizem drip -Increased Lopressor to 100 mg twice daily with holding parameters.  -Patient was on Lopressor 50 mg twice daily at home. -Continue Xarelto for full anticoagulation CVA prevention -Echocardiogram--EF 45-50%, global HK, mild MR/trivial TR -TSH--3.265  Alcohol abuse with concern for alcohol withdrawal -Precedex discontinued on 08/28/2019. -Calm this morning, tremors improving on Valium.  -Started Valium 5 mg 3 times daily on 08/29/2019, wean valium down to 2.5 mg  7/8 -Continue trazodone 50 mg nightly as needed for sleep.  -Continue multivitamin, thiamine and folate.   Visual hallucination suspect secondary to alcohol withdrawal. RESOLVED  Management as stated above  Generalized weakness -Multifactorial including the patient's recent immunotherapy/chemotherapy, deconditioning, and atrial fibrillation with RVR -TSH--3.265 -Serum B76--283 -Folic TDVV--6.1 -PT evaluation-->SNF-->pt and wife declined -plan for d/c home with HHPT when we can get his HR better controlled.   Uncontrolled pain -Patient has had increasing pain in the right hip area since his falls -08/25/2019 right hip x-ray--persistent  destructive lesion in the greater trochanter  of the proximal right femur with extension into the intertrochanteric region;no acute fracture -ContinueIV Dilaudid as needed for severe pain and oral oxycodone as needed for moderate pain -Continue bowel regimen while on narcotics  Improving hypovolemic hyponatremia likely multifactorial secondary to SIADH and alcohol use disorder -Continue to encourage oral intake -Continue to abstain from alcohol use  Metastatic hepatocellular carcinoma -Patient follows with Dr. Marin Olp -He is status post palliative radiation to the right femur approximately 65 weeks prior to this admission -Tecentriq/Avastin -- startedcycle #1 on 08/05/2019  Essential hypertension Blood pressure is at goal Continue Lopressor, increased further to 100 mg BID  Improving thrombocytopenia likely multifactorial secondary to recent chemotherapy, alcohol use disorder, and hepatocellular carcinoma -Likely due to recent chemotherapy/immunotherapyin the setting of chronic alcohol use -No overt bleeding -Platelet count up trending, 124K> 141K  Resolved post repletion: Hypomagnesemia -Serum magnesium 1.8.  GERD Stable Continue PPI  Goals of care -08/27/2019--goals of care discussion with the patient's spouse -Continue to treat the treatable -Changed to DNR confirmed with spouse  Status is: inpatient  Patient will need inpatient due to ongoing treatment for A. fib with RVR with uncontrolled rate, titrating metoprolol dose further as HR remains in the 120 range, increased metoprolol to 100 mg BID   Dispo: The patient is from:Home Anticipated d/c is to: Home with home health services (wife and patient continue to refuse SNF) Anticipated d/c date is: 09/02/2019. Patient currently is not medically stable to d/c.  Titrating metoprolol for better HR control.    Family Communication:Spouse updatedat bedside 65/08/2019,  09/01/19  Consultants:palliative care team.  Code Status: DNR.  DVT Prophylaxis:xarelto   Procedures: As Listed in Progress Note Above  Antibiotics: None   Objective: Vitals:   09/01/19 0600 09/01/19 0630 09/01/19 0700 09/01/19 0719  BP: 103/74     Pulse: (!) 107   89  Resp: (!) 22 14  18   Temp:    98.3 F (36.8 C)  TempSrc:    Oral  SpO2: 98% 97% 96% 100%  Weight:      Height:        Intake/Output Summary (Last 24 hours) at 09/01/2019 0911 Last data filed at 08/31/2019 1750 Gross per 24 hour  Intake --  Output 675 ml  Net -675 ml   Filed Weights   08/30/19 0500 08/31/19 0500 09/01/19 0500  Weight: 90 kg 90.2 kg 90.2 kg   Exam:  . General: 64 y.o. year-old male pleasant, well-developed well-nourished in no acute distress.  Alert and interactive.   . Cardiovascular: tachycardic Irregular rate and rhythm no rubs or gallops.   Marland Kitchen Respiratory: Clear to auscultation no wheezes or rales. . Abdomen: Soft nontender normal bowel sounds present.   . Musculoskeletal: No lower extremity edema bilaterally.   Marland Kitchen Psychiatry: Mood is appropriate for condition and setting. . Neurological: nonfocal exam.    Data Reviewed: CBC: Recent Labs  Lab 08/25/19 1023 08/25/19 1023 08/26/19 0433 08/27/19 0329 08/28/19 0452 08/29/19 0423 08/30/19 0515  WBC 7.8   < > 5.7 5.2 7.1 7.9 7.3  NEUTROABS 5.7  --   --   --   --   --   --   HGB 12.5*   < > 11.7* 11.0* 12.9* 12.5* 11.8*  HCT 39.0   < > 37.1* 34.9* 41.1 38.6* 36.7*  MCV 100.8*   < > 102.8* 102.6* 102.8* 100.3* 101.4*  PLT 109*   < > 114* 107* 115* 124* 141*   < > = values in this  interval not displayed.   Basic Metabolic Panel: Recent Labs  Lab 08/25/19 1023 08/25/19 1023 08/26/19 0433 08/27/19 0329 08/28/19 0452 08/29/19 0423 08/30/19 0515  NA 129*   < > 131* 131* 130* 132* 132*  K 4.6   < > 4.6 4.4 4.7 4.0 4.1  CL 99   < > 99 101 99 99 100  CO2 22   < > 24 26 22 23 23   GLUCOSE 108*   < > 100* 104* 110*  112* 112*  BUN 8   < > 11 9 11 10 11   CREATININE 0.50*   < > 0.46* 0.48* 0.53* 0.59* 0.56*  CALCIUM 7.8*   < > 7.7* 7.6* 7.9* 8.1* 7.8*  MG  --   --  1.6* 1.6* 1.6* 1.7 1.8  PHOS 2.9  --   --   --   --   --  2.9   < > = values in this interval not displayed.   GFR: Estimated Creatinine Clearance: 104 mL/min (A) (by C-G formula based on SCr of 0.56 mg/dL (L)). Liver Function Tests: Recent Labs  Lab 08/25/19 1023 08/26/19 0433 08/29/19 0423 08/30/19 0515  AST 72* 70* 81* 76*  ALT 34 32 36 39  ALKPHOS 119 108 118 118  BILITOT 2.6* 2.7* 2.4* 2.6*  PROT 6.0* 5.2* 5.5* 5.6*  ALBUMIN 2.5* 2.2* 2.1* 2.2*   No results for input(s): LIPASE, AMYLASE in the last 168 hours. No results for input(s): AMMONIA in the last 168 hours. Coagulation Profile: No results for input(s): INR, PROTIME in the last 168 hours. Cardiac Enzymes: No results for input(s): CKTOTAL, CKMB, CKMBINDEX, TROPONINI in the last 168 hours. BNP (last 3 results) No results for input(s): PROBNP in the last 8760 hours. HbA1C: No results for input(s): HGBA1C in the last 72 hours. CBG: Recent Labs  Lab 08/25/19 0947  GLUCAP 106*   Lipid Profile: No results for input(s): CHOL, HDL, LDLCALC, TRIG, CHOLHDL, LDLDIRECT in the last 72 hours. Thyroid Function Tests: No results for input(s): TSH, T4TOTAL, FREET4, T3FREE, THYROIDAB in the last 72 hours. Anemia Panel: No results for input(s): VITAMINB12, FOLATE, FERRITIN, TIBC, IRON, RETICCTPCT in the last 72 hours. Urine analysis:    Component Value Date/Time   COLORURINE AMBER (A) 08/25/2019 1134   APPEARANCEUR CLEAR 08/25/2019 1134   LABSPEC 1.014 08/25/2019 1134   PHURINE 6.0 08/25/2019 1134   GLUCOSEU NEGATIVE 08/25/2019 1134   HGBUR NEGATIVE 08/25/2019 1134   BILIRUBINUR SMALL (A) 08/25/2019 1134   KETONESUR NEGATIVE 08/25/2019 1134   PROTEINUR NEGATIVE 08/25/2019 1134   UROBILINOGEN 0.2 02/18/2014 2043   NITRITE NEGATIVE 08/25/2019 1134   LEUKOCYTESUR NEGATIVE  08/25/2019 1134    Recent Results (from the past 240 hour(s))  SARS Coronavirus 2 by RT PCR (hospital order, performed in [facility] hospital lab) Nasopharyngeal Nasopharyngeal Swab     Status: None   Collection Time: 08/25/19  2:35 PM   Specimen: Nasopharyngeal Swab  Result Value Ref Range Status   SARS Coronavirus 2 NEGATIVE NEGATIVE Final    Comment: (NOTE) SARS-CoV-2 target nucleic acids are NOT DETECTED.  The SARS-CoV-2 RNA is generally detectable in upper and lower respiratory specimens during the acute phase of infection. The lowest concentration of SARS-CoV-2 viral copies this assay can detect is 250 copies / mL. A negative result does not preclude SARS-CoV-2 infection and should not be used as the sole basis for treatment or other patient management decisions.  A negative result may occur with improper specimen collection /  handling, submission of specimen other than nasopharyngeal swab, presence of viral mutation(s) within the areas targeted by this assay, and inadequate number of viral copies (<250 copies / mL). A negative result must be combined with clinical observations, patient history, and epidemiological information.  Fact Sheet for Patients:   StrictlyIdeas.no  Fact Sheet for Healthcare Providers: BankingDealers.co.za  This test is not yet approved or  cleared by the [place] FDA and has been authorized for detection and/or diagnosis of SARS-CoV-2 by FDA under an Emergency Use Authorization (EUA).  This EUA will remain in effect (meaning this test can be used) for the duration of the COVID-19 declaration under Section 564(b)(1) of the Act, 21 U.S.C. section 360bbb-3(b)(1), unless the authorization is terminated or revoked sooner.  Performed at [facility], [address], [place], [place] [address]   MRSA PCR Screening     Status: None   Collection Time: 08/25/19  4:40 PM   Specimen: Nasopharyngeal   Result Value Ref Range Status   MRSA by PCR NEGATIVE NEGATIVE Final    Comment:        The GeneXpert MRSA Assay (FDA approved for NASAL specimens only), is one component of a comprehensive MRSA colonization surveillance program. It is not intended to diagnose MRSA infection nor to guide or monitor treatment for MRSA infections. Performed at [facility], [address], [place], [place] [address]      Studies: No results found.  Scheduled Meds: . Chlorhexidine Gluconate Cloth  6 each Topical Daily  . folic acid  1 mg Oral Daily  . metoprolol tartrate  100 mg Oral BID  . multivitamin with minerals  1 tablet Oral Daily  . oxybutynin  5 mg Oral TID  . pantoprazole  40 mg Oral Daily  . polyethylene glycol  17 g Oral Daily  . rivaroxaban  20 mg Oral Q supper  . senna  2 tablet Oral Daily  . thiamine  100 mg Oral Daily   Or  . thiamine  100 mg Intravenous Daily   Continuous Infusions:   LOS: 6 days    Critical Care Procedure Note Authorized and Performed by: Murvin Natal MD  Total Critical Care time:  30 minutes Due to a high probability of clinically significant, life threatening deterioration, the patient required my highest level of preparedness to intervene emergently and I personally spent this critical care time directly and personally managing the patient.  This critical care time included obtaining a history; examining the patient, pulse oximetry; ordering and review of studies; arranging urgent treatment with development of a management plan; evaluation of patient's response of treatment; frequent reassessment; and discussions with other providers.  This critical care time was performed to assess and manage the high probability of imminent and life threatening deterioration that could result in multi-organ failure.  It was exclusive of separately billable procedures and treating other patients and teaching time.   Irwin Brakeman, MD [facility] How to contact  the [facility] Attending or Consulting provider [place] or covering provider during after hours [place], for this patient?  1. Check the care team in [facility] and look for a) attending/consulting TRH provider listed and b) the [facility] team listed 2. Log into [email or web address] and use [place]'s universal password to access. If you do not have the password, please contact the hospital operator. 3. Locate the [facility] provider you are looking for under [facility] and page to a number that you can be directly reached. 4. If you  still have difficulty reaching the provider, please page the [facility] (Director on Call) for the Hospitalists listed on amion for assistance.   If 7PM-7AM, please contact night-coverage [email or web address] Password [email or web address] 09/01/2019, 9:11 AM

## 2019-09-02 ENCOUNTER — Telehealth: Payer: Self-pay | Admitting: *Deleted

## 2019-09-02 MED ORDER — ADULT MULTIVITAMIN W/MINERALS CH: 1.0000 | ORAL_TABLET | Freq: Every day | ORAL | Status: AC

## 2019-09-02 MED ORDER — POLYETHYLENE GLYCOL 3350 17 G PO PACK: 17.0000 g | PACK | Freq: Every day | ORAL | 1 refills | Status: AC | PRN

## 2019-09-02 MED ORDER — OXYBUTYNIN CHLORIDE 5 MG PO TABS: 5.0000 mg | ORAL_TABLET | Freq: Three times a day (TID) | ORAL | 1 refills | Status: AC

## 2019-09-02 MED ORDER — OMEPRAZOLE 20 MG PO CPDR: 20.0000 mg | DELAYED_RELEASE_CAPSULE | Freq: Every day | ORAL | 0 refills | Status: DC

## 2019-09-02 MED ORDER — OXYCODONE HCL 5 MG PO TABS: 5.0000 mg | ORAL_TABLET | Freq: Four times a day (QID) | ORAL | 0 refills | Status: AC | PRN

## 2019-09-02 MED ORDER — DIAZEPAM 2 MG PO TABS: 2.0000 mg | ORAL_TABLET | Freq: Two times a day (BID) | ORAL | 0 refills | Status: DC | PRN

## 2019-09-02 MED ORDER — FOLIC ACID 1 MG PO TABS: 1.0000 mg | ORAL_TABLET | Freq: Every day | ORAL | 1 refills | Status: DC

## 2019-09-02 MED ORDER — METOPROLOL TARTRATE 100 MG PO TABS: 100.0000 mg | ORAL_TABLET | Freq: Two times a day (BID) | ORAL | 1 refills | Status: DC

## 2019-09-02 MED ORDER — THIAMINE HCL 100 MG PO TABS: 100.0000 mg | ORAL_TABLET | Freq: Every day | ORAL | 1 refills | Status: AC

## 2019-09-02 NOTE — TOC Transition Note (Signed)
Transition of Care [facility]) - CM/SW Discharge Note   Patient Details  Name: Kerry Adams MRN: [id number] Date of Birth: 19-Jul-1955  [facility] of [facility]) CM/SW Contact:  Salome Arnt, LCSW Phone Number: 09/02/2019, 10:57 AM   Clinical Narrative:  Pt d/c home today with home health PT, RN, aide, and SW. Pt's wife reports hospital bed was delivered already. [name] Basta with [facility] notified of d/c. Pt's wife requesting [facility] transport home. Address confirmed.      Final next level of care: [place] Barriers to Discharge: Barriers Resolved   Patient Goals and CMS Choice Patient states their goals for this hospitalization and ongoing recovery are:: return home CMS Medicare.gov Compare Post Acute Care list provided to:: Patient Choice offered to / list presented to : Patient  Discharge Placement                Patient to be transferred to facility by: [facility] Name of family member notified: Arbie Cookey- wife Patient and family notified of of transfer: 09/02/19  Discharge Plan and Services In-house Referral: Clinical Social Work   Post Acute Care Choice: Home Health          DME Arranged: Hospital bed DME Agency: [employer] Date DME Agency Contacted: 09/02/19 Time DME Agency Contacted: [id number] Representative spoke with at DME Agency: [place]: RN, PT, Social Work, Nurse's Aide [place]: [place] ([place]) Date [place]: 09/02/19 Time [place]: 1056 Representative spoke with at [place]: [place] ([place]) Interventions     Readmission Risk Interventions No flowsheet data found.

## 2019-09-02 NOTE — Discharge Summary (Signed)
Physician Discharge Summary  Kerry Adams GDJ:[id number] DOB: 1955-06-14 DOA: 08/25/2019  PCP: Vicenta Aly, FNP Oncologist: Dr. Marin Olp Cardiologist: Curt Bears   Admit date: 08/25/2019 Discharge date: 09/02/2019  Admitted From:  Home  Disposition:  Home with [facility] (Declines SNF)  Recommendations for Outpatient Follow-up:  1. Follow up with Dr. Marin Olp as scheduled 2. Follow up with cardiology in 1-2 weeks 3. Follow up with PCP as scheduled.   Home Health: PT, RN, Aide, SW  Discharge Condition: STABLE   CODE STATUS: DNR    Brief Hospitalization Summary: Please see all hospital notes, images, labs for full details of the hospitalization. 64 y.o.malewith medical history ofpersistent atrial fibrillation, hypertension, alcohol dependence, and metastatic hepatocellular carcinoma to the lung, bone, and lymph nodes.He does drink quite a bit, 12 beers and 2 shots a day.The patient was recently started onTecentriq/Avastin -- start cycle #1 on 06/11/2021for his hepatocellular carcinoma. He also finished up radiation therapy to his right hip approximately 3 weeks before that. He says that for the past 3 to 4 weeks he has experienced increasing generalized weakness and leg weakness. He has had multiple falls. In the past 2 to 3 days, the patient has had numerous falls the last being on 08/23/2019. Since his falls, the patient has had increasing pain in his right hip and increasing difficulty with ambulation and getting up out of his chair. He has been taking tramadol around-the-clock without significant help. He has not had any fevers, chills, headache, chest pain, shortness breath, cough, hemoptysis, nausea, vomiting, diarrhea, abdominal pain. Because of the increasing weakness and right hip pain, he presented for further evaluation. He denies any dysuria, hematochezia, melena, hematuria. He is compliant with all his medications. In the emergency department, the patient was afebrile and  hemodynamically stable. He was noted to have atrial fibrillation with RVR with a heart rate 120s.The patient was given 2 IV pushes of metoprolol, 2.5, and then 5 mg. Subsequently, diltiazem drip was started. Patient was admitted for further evaluation and treatment.  On the evening of 08/26/2019, the patient became increasingly agitated requiring multiple doses of intravenous Ativan. After 8 mg of Ativan, the patient remained agitated, and he was subsequently placed on a Precedex drip.  Off Precedex since 08/28/2019.  09/02/19: Seen and examined at his bedside. Pt says he feels well to go home.  He wants to get back to treating his cancer.  He is going to follow up with cardiology.  His wife made arrangements for him to be home which is their wish.      Assessment/Plan: Active Problems:   Hypertension   Other persistent atrial fibrillation (HCC)   Goals of care, counseling/discussion   Hepatocellular carcinoma metastatic to lymph nodes of multiple sites [facility])   Atrial fibrillation with RVR (HCC)   Delirium tremens (HCC)   Alcohol abuse with withdrawal   Atrial fibrillation (HCC)  Permanent atrial fibrillationwith RVR - Rate poorly controlled -Rate better controlled.  -CHADSVasc--2 -Weaning off Cardizem drip -continue increased dose of Lopressor 100 mg twice daily.    -Continue Xarelto for full anticoagulation CVA prevention -Echocardiogram--EF 45-50%, global HK, mild MR/trivial TR -TSH--3.265  Alcohol abuse with concern for alcohol withdrawal -Precedex discontinued on 08/28/2019. -Calm this morning, tremors improving on Valium.  -Started Valium 5 mg 3 times daily on 08/29/2019, weaned valium down to 2 mg  to use PRN only, #14 tabs prescribed no refill -Continue trazodone 50 mg nightly as needed for sleep.  -Continue multivitamin, thiamine and folate.  Visual hallucination suspect secondary to alcohol withdrawal. RESOLVED  Management as stated above  Generalized  weakness -Multifactorial including the patient's recent immunotherapy/chemotherapy, deconditioning, and atrial fibrillation with RVR -TSH--3.265 -Serum P54--656 -Folic CLEX--5.1 -PT evaluation-->SNF-->pt and wife declined -plan for d/c home with HHPT when we can get his HR better controlled.   Uncontrolled pain -Patient has had increasing pain in the right hip area since his falls -08/25/2019 right hip x-ray--persistent destructive lesion in the greater trochanter of the proximal right femur with extension into the intertrochanteric region;no acute fracture -Continue bowel regimen while on narcotics  Improving hypovolemic hyponatremia likely multifactorial secondary to SIADH and alcohol use disorder -Continue to encourage oral intake -Continue to abstain from alcohol use  Metastatic hepatocellular carcinoma -Patient follows with Dr. Marin Olp -He is status post palliative radiation to the right femur approximately 6 weeks prior to this admission -Tecentriq/Avastin -- startedcycle #1 on 08/05/2019  Essential hypertension Blood pressure is at goal Continue Lopressor, 100 mg BID  Improving thrombocytopenia likely multifactorial secondary to recent chemotherapy, alcohol use disorder, and hepatocellular carcinoma -Likely due to recent chemotherapy/immunotherapyin the setting of chronic alcohol use -No overt bleeding -Platelet count up trending, 124K> 141K  Resolved post repletion: Hypomagnesemia -Serum magnesium 1.8.  GERD Stable Continue PPI  Goals of care -08/27/2019--goals of care discussion with the patient's spouse -Continue to treat the treatable -Changed to DNR confirmed with spouse  Status ZG:[id number]  Patient will need inpatient due to ongoing treatment for A. fib with RVR with uncontrolled rate, titrating metoprolol dose further as HR remains in the 120 range, increased metoprolol to 100 mg BID   Dispo: The patient is from:Home Anticipated  d/c is to: Home with home health services (wife and patient continue to refuse SNF) Anticipated d/c date is: 09/02/2019. Patient currently is medically stable to d/c.   Family Communication:Spouse updatedat bedside 08/31/2019, 09/01/19, 09/02/19  Consultants:palliative care team.  Code Status: DNR.  DVT Prophylaxis:xarelto  Discharge Diagnoses:  Active Problems:   Hypertension   Other persistent atrial fibrillation (HCC)   Goals of care, counseling/discussion   Hepatocellular carcinoma metastatic to lymph nodes of multiple sites [facility])   Atrial fibrillation with RVR (HCC)   Delirium tremens ([place])   Alcohol abuse with withdrawal   Atrial fibrillation [facility])   Discharge Instructions:  Allergies as of 09/02/2019   No Known Allergies     Medication List    STOP taking these medications   amoxicillin 500 MG capsule Commonly known as: AMOXIL   NYQUIL MULTI-SYMPTOM PO   ondansetron 8 MG tablet Commonly known as: Zofran   prochlorperazine 10 MG tablet Commonly known as: COMPAZINE     TAKE these medications   acetaminophen 500 MG tablet Commonly known as: TYLENOL Take 500 mg by mouth every 8 (eight) hours as needed (pain).   diazepam 2 MG tablet Commonly known as: VALIUM Take 1 tablet (2 mg total) by mouth every 12 (twelve) hours as needed for anxiety.   famotidine 10 MG tablet Commonly known as: PEPCID Take 10 mg by mouth every morning.   folic acid 1 MG tablet Commonly known as: FOLVITE Take 1 tablet (1 mg total) by mouth daily.   Imodium A-D 2 MG tablet Generic drug: loperamide Take 1 mg by mouth at bedtime as needed for diarrhea or loose stools. Takes 1/2 tablet total of 1 mg at bedtime.   metoprolol tartrate 100 MG tablet Commonly known as: LOPRESSOR Take 1 tablet (100 mg total) by mouth 2 (two) times daily. What  changed:   medication strength  how much to take   multivitamin with minerals Tabs tablet Take 1 tablet by  mouth daily.   omeprazole 20 MG capsule Commonly known as: PriLOSEC Take 1 capsule (20 mg total) by mouth daily.   oxybutynin 5 MG tablet Commonly known as: DITROPAN Take 1 tablet (5 mg total) by mouth 3 (three) times daily.   polyethylene glycol 17 g packet Commonly known as: MIRALAX / GLYCOLAX Take 17 g by mouth daily as needed for mild constipation.   Potassium 99 MG Tabs Take 99 mg by mouth daily.   rivaroxaban 20 MG Tabs tablet Commonly known as: Xarelto Take 1 tablet (20 mg total) by mouth daily with supper.   thiamine 100 MG tablet Take 1 tablet (100 mg total) by mouth daily.   traMADol 50 MG tablet Commonly known as: ULTRAM Take 1 tablet (50 mg total) by mouth every 6 (six) hours as needed.   trolamine salicylate 10 % cream Commonly known as: ASPERCREME Apply 1 application topically as needed for muscle pain.            Durable Medical Equipment  (From admission, onward)         Start     Ordered   08/26/19 1445  For home use only DME Hospital bed  Once       Question Answer Comment  Length of Need Lifetime   Patient has (list medical condition): metastatic hepatocellular carcinoma   The above medical condition requires: Patient requires the ability to reposition frequently   Bed type Semi-electric   Support Surface: Gel Overlay      08/26/19 1444          Follow-up Information    [facility] Follow up.   Specialty: Home Health Services Why: Will call you to schedule. Home health RN, physical therapy, social worker, and aide.        Vicenta Aly, [place]. Schedule an appointment as soon as possible for a visit in 1 week.   Specialty: Nurse Practitioner Contact information: [place] [address] [phone]        Fay Records, MD .   Specialty: Cardiology Contact information: [address] [place] [address] [place] [address] [phone]        Constance Haw, MD.  Schedule an appointment as soon as possible for a visit in 2 week(s).   Specialty: Cardiology Contact information: [address] [place] [address] [phone]        Volanda Napoleon, MD. Schedule an appointment as soon as possible for a visit in 1 week(s).   Specialty: Oncology Contact information: [address] [place] [address] [phone]              No Known Allergies Allergies as of 09/02/2019   No Known Allergies     Medication List    STOP taking these medications   amoxicillin 500 MG capsule Commonly known as: AMOXIL   NYQUIL MULTI-SYMPTOM PO   ondansetron 8 MG tablet Commonly known as: Zofran   prochlorperazine 10 MG tablet Commonly known as: COMPAZINE     TAKE these medications   acetaminophen 500 MG tablet Commonly known as: TYLENOL Take 500 mg by mouth every 8 (eight) hours as needed (pain).   diazepam 2 MG tablet Commonly known as: VALIUM Take 1 tablet (2 mg total) by mouth every 12 (twelve) hours as  needed for anxiety.   famotidine 10 MG tablet Commonly known as: PEPCID Take 10 mg by mouth every morning.   folic acid 1 MG tablet Commonly known as: FOLVITE Take 1 tablet (1 mg total) by mouth daily.   Imodium A-D 2 MG tablet Generic drug: loperamide Take 1 mg by mouth at bedtime as needed for diarrhea or loose stools. Takes 1/2 tablet total of 1 mg at bedtime.   metoprolol tartrate 100 MG tablet Commonly known as: LOPRESSOR Take 1 tablet (100 mg total) by mouth 2 (two) times daily. What changed:   medication strength  how much to take   multivitamin with minerals Tabs tablet Take 1 tablet by mouth daily.   omeprazole 20 MG capsule Commonly known as: PriLOSEC Take 1 capsule (20 mg total) by mouth daily.   oxybutynin 5 MG tablet Commonly known as: DITROPAN Take 1 tablet (5 mg total) by mouth 3 (three) times daily.   polyethylene glycol 17 g packet Commonly known as: MIRALAX /  GLYCOLAX Take 17 g by mouth daily as needed for mild constipation.   Potassium 99 MG Tabs Take 99 mg by mouth daily.   rivaroxaban 20 MG Tabs tablet Commonly known as: Xarelto Take 1 tablet (20 mg total) by mouth daily with supper.   thiamine 100 MG tablet Take 1 tablet (100 mg total) by mouth daily.   traMADol 50 MG tablet Commonly known as: ULTRAM Take 1 tablet (50 mg total) by mouth every 6 (six) hours as needed.   trolamine salicylate 10 % cream Commonly known as: ASPERCREME Apply 1 application topically as needed for muscle pain.            Durable Medical Equipment  (From admission, onward)         Start     Ordered   08/26/19 1445  For home use only DME Hospital bed  Once       Question Answer Comment  Length of Need Lifetime   Patient has (list medical condition): metastatic hepatocellular carcinoma   The above medical condition requires: Patient requires the ability to reposition frequently   Bed type Semi-electric   Support Surface: Gel Overlay      08/26/19 1444          Procedures/Studies: DG Chest 1 View  Result Date: 08/25/2019 CLINICAL DATA:  Fall. EXAM: CHEST  1 VIEW COMPARISON:  February 20, 2015.  June 20, 2019. FINDINGS: Stable cardiomegaly. No pneumothorax or pleural effusion is noted. Old right rib fractures are noted. No acute pulmonary disease is noted. Large rounded mass is noted projected over right lung apex consistent with expansile bone lesion involving the right first rib as described on prior CT scan, concerning for malignancy. IMPRESSION: Large rounded mass is noted projected over right lung apex consistent with expansile bone lesion involving the right first rib as described on prior CT scan, concerning for malignancy. No acute pulmonary disease is noted. Aortic Atherosclerosis (ICD10-I70.0). Electronically Signed   By: Marijo Conception M.D.   On: 08/25/2019 10:38   DG Pelvis 1-2 Views  Result Date: 08/25/2019 CLINICAL DATA:  Right  hip pain after fall. EXAM: PELVIS - 1-2 VIEW COMPARISON:  None. FINDINGS: Postsurgical changes are again noted involving the proximal right femur and intertrochanteric region. There is noted severe lytic lesion involving the intertrochanteric region of the proximal right femur consistent with malignancy or metastatic disease. Degenerative changes are seen involving the pubic symphysis. Left hip and bilateral sacroiliac joints are unremarkable.  No definite evidence of acute fracture is noted. IMPRESSION: Severe lytic lesion is noted involving the intertrochanteric region of the proximal right femur consistent with malignancy or metastatic disease. No acute fracture or dislocation is noted. Electronically Signed   By: Marijo Conception M.D.   On: 08/25/2019 10:43   CT Head Wo Contrast  Result Date: 08/25/2019 CLINICAL DATA:  [place] and hit head 3 days ago. Persistent headache. EXAM: CT HEAD WITHOUT CONTRAST TECHNIQUE: Contiguous axial images were obtained from the base of the skull through the vertex without intravenous contrast. COMPARISON:  None. FINDINGS: Brain: Mild age advanced cerebral atrophy, ventriculomegaly and periventricular white matter disease. No extra-axial fluid collections are identified. No CT findings for acute hemispheric infarction or intracranial hemorrhage. No mass lesions. The brainstem and cerebellum are normal. Vascular: Minimal vascular calcifications. No aneurysm or hyperdense vessels. Skull: No skull fracture or bone lesions. Sinuses/Orbits: The paranasal sinuses and mastoid air cells are clear. The globes are intact. Other: No scalp lesions or hematoma. IMPRESSION: 1. Mild age advanced cerebral atrophy, ventriculomegaly and periventricular white matter disease. 2. No acute intracranial findings or skull fracture. Electronically Signed   By: Marijo Sanes M.D.   On: 08/25/2019 10:25   US Venous Img Lower Bilateral (DVT)  Result Date: 08/18/2019 CLINICAL DATA:  Bilateral lower extremity  pain and edema. History of malignancy. Evaluate for DVT. EXAM: BILATERAL LOWER EXTREMITY VENOUS DOPPLER ULTRASOUND TECHNIQUE: Gray-scale sonography with graded compression, as well as color Doppler and duplex ultrasound were performed to evaluate the lower extremity deep venous systems from the level of the common femoral vein and including the common femoral, femoral, profunda femoral, popliteal and calf veins including the posterior tibial, peroneal and gastrocnemius veins when visible. The superficial great saphenous vein was also interrogated. Spectral Doppler was utilized to evaluate flow at rest and with distal augmentation maneuvers in the common femoral, femoral and popliteal veins. COMPARISON:  None. FINDINGS: RIGHT LOWER EXTREMITY Common Femoral Vein: No evidence of thrombus. Normal compressibility, respiratory phasicity and response to augmentation. Saphenofemoral Junction: No evidence of thrombus. Normal compressibility and flow on color Doppler imaging. Profunda Femoral Vein: No evidence of thrombus. Normal compressibility and flow on color Doppler imaging. Femoral Vein: No evidence of thrombus. Normal compressibility, respiratory phasicity and response to augmentation. Popliteal Vein: No evidence of thrombus. Normal compressibility, respiratory phasicity and response to augmentation. Calf Veins: No evidence of thrombus. Normal compressibility and flow on color Doppler imaging. Superficial Great Saphenous Vein: No evidence of thrombus. Normal compressibility. Venous Reflux:  None. Other Findings:  None. LEFT LOWER EXTREMITY Common Femoral Vein: No evidence of thrombus. Normal compressibility, respiratory phasicity and response to augmentation. Saphenofemoral Junction: No evidence of thrombus. Normal compressibility and flow on color Doppler imaging. Profunda Femoral Vein: No evidence of thrombus. Normal compressibility and flow on color Doppler imaging. Femoral Vein: No evidence of thrombus. Normal  compressibility, respiratory phasicity and response to augmentation. Popliteal Vein: No evidence of thrombus. Normal compressibility, respiratory phasicity and response to augmentation. Calf Veins: No evidence of thrombus. Normal compressibility and flow on color Doppler imaging. Superficial Great Saphenous Vein: No evidence of thrombus. Normal compressibility. Venous Reflux:  None. Other Findings:  None. IMPRESSION: No evidence of DVT within either lower extremity. Electronically Signed   By: Sandi Mariscal M.D.   On: 08/18/2019 16:24   ECHOCARDIOGRAM COMPLETE  Result Date: 08/26/2019    ECHOCARDIOGRAM REPORT   Patient Name:   Kerry Adams Date of Exam: 08/26/2019 Medical Rec #:  [id number]  Height:       73.0 in Accession #:    [id number]      Weight:       204.6 lb Date of Birth:  1955-03-30       BSA:          2.172 m Patient Age:    63 years        BP:           112/65 mmHg Patient Gender: M               HR:           87 bpm. Exam Location:  Inpatient Procedure: 2D Echo, Cardiac Doppler and Color Doppler Indications:    Atrial fibrillation  History:        Patient has prior history of Echocardiogram examinations, most                 recent 07/20/2018. Risk Factors:Hypertension and Dyslipidemia.                 ETOH abuse.  Sonographer:    Clayton Lefort RDCS (AE) Referring Phys: [phone] DAVID TAT IMPRESSIONS  1. Left ventricular ejection fraction, by estimation, is 45 to 50%. The left ventricle has mildly decreased function. The left ventricle demonstrates global hypokinesis. There is mild left ventricular hypertrophy. Left ventricular diastolic parameters are indeterminate.  2. Right ventricular systolic function is low normal. The right ventricular size is mildly enlarged. There is mildly elevated pulmonary artery systolic pressure. The estimated right ventricular systolic pressure is 21.1 mmHg.  3. Left atrial size was severely dilated.  4. Right atrial size was severely dilated.  5. The mitral valve is  grossly normal, mildly thickened. Mild mitral valve regurgitation.  6. The aortic valve is tricuspid. Aortic valve regurgitation is not visualized. Mild aortic valve stenosis. Aortic valve area, by VTI measures 1.62 cm. Aortic valve mean gradient measures 9.0 mmHg.  7. The inferior vena cava is dilated in size with >50% respiratory variability, suggesting right atrial pressure of 8 mmHg. FINDINGS  Left Ventricle: Left ventricular ejection fraction, by estimation, is 45 to 50%. The left ventricle has mildly decreased function. The left ventricle demonstrates global hypokinesis. The left ventricular internal cavity size was normal in size. There is  mild left ventricular hypertrophy. Left ventricular diastolic parameters are indeterminate. Right Ventricle: The right ventricular size is mildly enlarged. No increase in right ventricular wall thickness. Right ventricular systolic function is low normal. There is mildly elevated pulmonary artery systolic pressure. The tricuspid regurgitant velocity is 2.93 m/s, and with an assumed right atrial pressure of 8 mmHg, the estimated right ventricular systolic pressure is 94.1 mmHg. Left Atrium: Left atrial size was severely dilated. Right Atrium: Right atrial size was severely dilated. Pericardium: There is no evidence of pericardial effusion. Mitral Valve: The mitral valve is grossly normal. Mild to moderate mitral annular calcification. Mild mitral valve regurgitation. Tricuspid Valve: The tricuspid valve is grossly normal. Tricuspid valve regurgitation is trivial. Aortic Valve: The aortic valve is tricuspid. Aortic valve regurgitation is not visualized. Mild aortic stenosis is present. Mild to moderate aortic valve annular calcification. Aortic valve mean gradient measures 9.0 mmHg. Aortic valve peak gradient measures 14.9 mmHg. Aortic valve area, by VTI measures 1.62 cm. Pulmonic Valve: The pulmonic valve was grossly normal. Pulmonic valve regurgitation is trivial. Aorta:  The aortic root is normal in size and structure. Venous: The inferior vena cava is dilated in size with greater than 50% respiratory  variability, suggesting right atrial pressure of 8 mmHg. IAS/Shunts: No atrial level shunt detected by color flow Doppler.  LEFT VENTRICLE PLAX 2D LVIDd:         4.75 cm LVIDs:         3.80 cm LV PW:         1.32 cm LV IVS:        1.05 cm LVOT diam:     2.10 cm LV SV:         55 LV SV Index:   25 LVOT Area:     3.46 cm  RIGHT VENTRICLE             IVC RV Basal diam:  5.19 cm     IVC diam: 2.44 cm RV Mid diam:    3.69 cm RV S prime:     12.40 cm/s TAPSE (M-mode): 2.0 cm LEFT ATRIUM              Index       RIGHT ATRIUM           Index LA diam:        5.90 cm  2.72 cm/m  RA Area:     39.80 cm LA Vol (A2C):   161.0 ml 74.11 ml/m RA Volume:   172.00 ml 79.17 ml/m LA Vol (A4C):   140.0 ml 64.44 ml/m LA Biplane Vol: 154.0 ml 70.89 ml/m  AORTIC VALVE AV Area (Vmax):    1.57 cm AV Area (Vmean):   1.53 cm AV Area (VTI):     1.62 cm AV Vmax:           193.20 cm/s AV Vmean:          142.600 cm/s AV VTI:            0.339 m AV Peak Grad:      14.9 mmHg AV Mean Grad:      9.0 mmHg LVOT Vmax:         87.60 cm/s LVOT Vmean:        63.140 cm/s LVOT VTI:          0.158 m LVOT/AV VTI ratio: 0.47  AORTA Ao Root diam: 3.50 cm TRICUSPID VALVE TR Peak grad:   34.3 mmHg TR Vmax:        293.00 cm/s  SHUNTS Systemic VTI:  0.16 m Systemic Diam: 2.10 cm Rozann Lesches MD Electronically signed by Rozann Lesches MD Signature Date/Time: 08/26/2019/11:00:17 AM    Final    DG Femur Min 2 Views Right  Result Date: 08/25/2019 CLINICAL DATA:  Right hip pain after fall. EXAM: RIGHT FEMUR 2 VIEWS COMPARISON:  April 07, 2019. FINDINGS: Status post surgical internal fixation old proximal right femoral pathologic fracture. Intramedullary rod fixation of the right femoral shaft is noted. Status post right total knee arthroplasty. Vascular calcifications are noted. No acute fracture or dislocation is noted.  There is continued presence of destructive lesion involving greater trochanter of the proximal right femur with extension into the intertrochanteric region and shaft. No acute fracture is noted. IMPRESSION: Postsurgical changes as described above. Continued presence of destructive lesion involving greater trochanter of proximal right femur with extension into the intertrochanteric region and shaft. Electronically Signed   By: Marijo Conception M.D.   On: 08/25/2019 10:42      Subjective: Pt says he wants to get home.  He feels much better.  He denies chest pain, SOB and palpitations.    Discharge Exam: Vitals:  09/02/19 0417 09/02/19 0418  BP: 136/88   Pulse: (!) 117 (!) 57  Resp: 16   Temp: 99.6 F (37.6 C)   SpO2: 98% 100%   Vitals:   09/01/19 2146 09/02/19 0002 09/02/19 0417 09/02/19 0418  BP: (!) 131/92 (!) 120/94 136/88   Pulse: (!) 107 (!) 111 (!) 117 (!) 57  Resp: 16 16 16    Temp: 97.7 F (36.5 C) (!) 97.4 F (36.3 C) 99.6 F (37.6 C)   TempSrc: Oral Oral Oral   SpO2: 98% 97% 98% 100%  Weight:      Height:       General: Pt is alert, awake, not in acute distress Cardiovascular: irregularly irregular normal S1/S2 +, no rubs, no gallops Respiratory: CTA bilaterally, no wheezing, no rhonchi Abdominal: Soft, NT, ND, bowel sounds + Extremities: no edema, no cyanosis   The results of significant diagnostics from this hospitalization (including imaging, microbiology, ancillary and laboratory) are listed below for reference.     Microbiology: Recent Results (from the past 240 hour(s))  SARS Coronavirus 2 by RT PCR (hospital order, performed in [facility] hospital lab) Nasopharyngeal Nasopharyngeal Swab     Status: None   Collection Time: 08/25/19  2:35 PM   Specimen: Nasopharyngeal Swab  Result Value Ref Range Status   SARS Coronavirus 2 NEGATIVE NEGATIVE Final    Comment: (NOTE) SARS-CoV-2 target nucleic acids are NOT DETECTED.  The SARS-CoV-2 RNA is generally  detectable in upper and lower respiratory specimens during the acute phase of infection. The lowest concentration of SARS-CoV-2 viral copies this assay can detect is 250 copies / mL. A negative result does not preclude SARS-CoV-2 infection and should not be used as the sole basis for treatment or other patient management decisions.  A negative result may occur with improper specimen collection / handling, submission of specimen other than nasopharyngeal swab, presence of viral mutation(s) within the areas targeted by this assay, and inadequate number of viral copies (<250 copies / mL). A negative result must be combined with clinical observations, patient history, and epidemiological information.  Fact Sheet for Patients:   StrictlyIdeas.no  Fact Sheet for Healthcare Providers: BankingDealers.co.za  This test is not yet approved or  cleared by the [place] FDA and has been authorized for detection and/or diagnosis of SARS-CoV-2 by FDA under an Emergency Use Authorization (EUA).  This EUA will remain in effect (meaning this test can be used) for the duration of the COVID-19 declaration under Section 564(b)(1) of the Act, 21 U.S.C. section 360bbb-3(b)(1), unless the authorization is terminated or revoked sooner.  Performed at [facility], [address], [place], [place] [address]   MRSA PCR Screening     Status: None   Collection Time: 08/25/19  4:40 PM   Specimen: Nasopharyngeal  Result Value Ref Range Status   MRSA by PCR NEGATIVE NEGATIVE Final    Comment:        The GeneXpert MRSA Assay (FDA approved for NASAL specimens only), is one component of a comprehensive MRSA colonization surveillance program. It is not intended to diagnose MRSA infection nor to guide or monitor treatment for MRSA infections. Performed at [facility], [address] [place], [place] [address]      Labs: BNP (last 3 results) Recent Labs     08/25/19 1023  BNP 403.4*   Basic Metabolic Panel: Recent Labs  Lab 08/27/19 0329 08/28/19 0452 08/29/19 0423 08/30/19 0515  NA 131* 130* 132* 132*  K 4.4 4.7 4.0 4.1  CL 101 99 99 100  CO2 26 22 23 23   GLUCOSE 104* 110* 112* 112*  BUN 9 11 10 11   CREATININE 0.48* 0.53* 0.59* 0.56*  CALCIUM 7.6* 7.9* 8.1* 7.8*  MG 1.6* 1.6* 1.7 1.8  PHOS  --   --   --  2.9   Liver Function Tests: Recent Labs  Lab 08/29/19 0423 08/30/19 0515  AST 81* 76*  ALT 36 39  ALKPHOS 118 118  BILITOT 2.4* 2.6*  PROT 5.5* 5.6*  ALBUMIN 2.1* 2.2*   No results for input(s): LIPASE, AMYLASE in the last 168 hours. No results for input(s): AMMONIA in the last 168 hours. CBC: Recent Labs  Lab 08/27/19 0329 08/28/19 0452 08/29/19 0423 08/30/19 0515  WBC 5.2 7.1 7.9 7.3  HGB 11.0* 12.9* 12.5* 11.8*  HCT 34.9* 41.1 38.6* 36.7*  MCV 102.6* 102.8* 100.3* 101.4*  PLT 107* 115* 124* 141*   Cardiac Enzymes: No results for input(s): CKTOTAL, CKMB, CKMBINDEX, TROPONINI in the last 168 hours. BNP: Invalid input(s): POCBNP CBG: No results for input(s): GLUCAP in the last 168 hours. D-Dimer No results for input(s): DDIMER in the last 72 hours. Hgb A1c No results for input(s): HGBA1C in the last 72 hours. Lipid Profile No results for input(s): CHOL, HDL, LDLCALC, TRIG, CHOLHDL, LDLDIRECT in the last 72 hours. Thyroid function studies No results for input(s): TSH, T4TOTAL, T3FREE, THYROIDAB in the last 72 hours.  Invalid input(s): FREET3 Anemia work up No results for input(s): VITAMINB12, FOLATE, FERRITIN, TIBC, IRON, RETICCTPCT in the last 72 hours. Urinalysis    Component Value Date/Time   COLORURINE AMBER (A) 08/25/2019 1134   APPEARANCEUR CLEAR 08/25/2019 1134   LABSPEC 1.014 08/25/2019 1134   PHURINE 6.0 08/25/2019 1134   GLUCOSEU NEGATIVE 08/25/2019 1134   HGBUR NEGATIVE 08/25/2019 1134   BILIRUBINUR SMALL (A) 08/25/2019 1134   KETONESUR NEGATIVE 08/25/2019 1134   PROTEINUR NEGATIVE  08/25/2019 1134   UROBILINOGEN 0.2 02/18/2014 2043   NITRITE NEGATIVE 08/25/2019 1134   LEUKOCYTESUR NEGATIVE 08/25/2019 1134   Sepsis Labs Invalid input(s): PROCALCITONIN,  WBC,  LACTICIDVEN Microbiology Recent Results (from the past 240 hour(s))  SARS Coronavirus 2 by RT PCR (hospital order, performed in [facility] hospital lab) Nasopharyngeal Nasopharyngeal Swab     Status: None   Collection Time: 08/25/19  2:35 PM   Specimen: Nasopharyngeal Swab  Result Value Ref Range Status   SARS Coronavirus 2 NEGATIVE NEGATIVE Final    Comment: (NOTE) SARS-CoV-2 target nucleic acids are NOT DETECTED.  The SARS-CoV-2 RNA is generally detectable in upper and lower respiratory specimens during the acute phase of infection. The lowest concentration of SARS-CoV-2 viral copies this assay can detect is 250 copies / mL. A negative result does not preclude SARS-CoV-2 infection and should not be used as the sole basis for treatment or other patient management decisions.  A negative result may occur with improper specimen collection / handling, submission of specimen other than nasopharyngeal swab, presence of viral mutation(s) within the areas targeted by this assay, and inadequate number of viral copies (<250 copies / mL). A negative result must be combined with clinical observations, patient history, and epidemiological information.  Fact Sheet for Patients:   StrictlyIdeas.no  Fact Sheet for Healthcare Providers: BankingDealers.co.za  This test is not yet approved or  cleared by the [place] FDA and has been authorized for detection and/or diagnosis of SARS-CoV-2 by FDA under an Emergency Use Authorization (EUA).  This EUA will remain in effect (meaning this  test can be used) for the duration of the COVID-19 declaration under Section 564(b)(1) of the Act, 21 U.S.C. section 360bbb-3(b)(1), unless the authorization is terminated or revoked  sooner.  Performed at [facility], [address], [place], [place] [address]   MRSA PCR Screening     Status: None   Collection Time: 08/25/19  4:40 PM   Specimen: Nasopharyngeal  Result Value Ref Range Status   MRSA by PCR NEGATIVE NEGATIVE Final    Comment:        The GeneXpert MRSA Assay (FDA approved for NASAL specimens only), is one component of a comprehensive MRSA colonization surveillance program. It is not intended to diagnose MRSA infection nor to guide or monitor treatment for MRSA infections. Performed at [facility], [address], [place], [place] [address]    Time coordinating discharge: 32 minutes   SIGNED:  Irwin Brakeman, MD  [facility] 09/02/2019, 10:21 AM How to contact the [facility] Attending or Consulting provider [place] or covering provider during after hours [place], for this patient?  1. Check the care team in [facility] and look for a) attending/consulting [facility] provider listed and b) the [facility] team listed 2. Log into [email or web address] and use [place]'s universal password to access. If you do not have the password, please contact the hospital operator. 3. Locate the [facility] provider you are looking for under [facility] and page to a number that you can be directly reached. 4. If you still have difficulty reaching the provider, please page the [facility] (Director on Call) for the Hospitalists listed on amion for assistance.

## 2019-09-02 NOTE — Progress Notes (Signed)
NURSING PROGRESS NOTE  Kerry Adams [id number] Discharge Data: 09/02/2019 1:44 PM Attending Provider: Murlean Iba, MD IOE:[id number], Helene Kelp, FNP     [name] to be D/C'd Home per MD order.  Discussed with the patient the After Visit Summary and all questions fully answered. All IV's discontinued with no bleeding noted. All belongings returned to patient for patient to take home.   Last Vital Signs:  Blood pressure 136/88, pulse (!) 57, temperature 99.6 F (37.6 C), temperature source Oral, resp. rate 16, height 6\' 1"  (1.854 m), weight 90.2 kg, SpO2 100 %.  Discharge Medication List Allergies as of 09/02/2019   No Known Allergies     Medication List    STOP taking these medications   amoxicillin 500 MG capsule Commonly known as: AMOXIL   NYQUIL MULTI-SYMPTOM PO   ondansetron 8 MG tablet Commonly known as: Zofran   prochlorperazine 10 MG tablet Commonly known as: COMPAZINE   traMADol 50 MG tablet Commonly known as: ULTRAM     TAKE these medications   acetaminophen 500 MG tablet Commonly known as: TYLENOL Take 500 mg by mouth every 8 (eight) hours as needed (pain).   diazepam 2 MG tablet Commonly known as: VALIUM Take 1 tablet (2 mg total) by mouth every 12 (twelve) hours as needed for anxiety.   famotidine 10 MG tablet Commonly known as: PEPCID Take 10 mg by mouth every morning.   folic acid 1 MG tablet Commonly known as: FOLVITE Take 1 tablet (1 mg total) by mouth daily.   Imodium A-D 2 MG tablet Generic drug: loperamide Take 1 mg by mouth at bedtime as needed for diarrhea or loose stools. Takes 1/2 tablet total of 1 mg at bedtime.   metoprolol tartrate 100 MG tablet Commonly known as: LOPRESSOR Take 1 tablet (100 mg total) by mouth 2 (two) times daily. What changed:   medication strength  how much to take   multivitamin with minerals Tabs tablet Take 1 tablet by mouth daily.   omeprazole 20 MG capsule Commonly known as: PriLOSEC Take  1 capsule (20 mg total) by mouth daily.   oxybutynin 5 MG tablet Commonly known as: DITROPAN Take 1 tablet (5 mg total) by mouth 3 (three) times daily.   oxyCODONE 5 MG immediate release tablet Commonly known as: Oxy IR/ROXICODONE Take 1 tablet (5 mg total) by mouth every 6 (six) hours as needed for up to 3 days for severe pain.   polyethylene glycol 17 g packet Commonly known as: MIRALAX / GLYCOLAX Take 17 g by mouth daily as needed for mild constipation.   Potassium 99 MG Tabs Take 99 mg by mouth daily.   rivaroxaban 20 MG Tabs tablet Commonly known as: Xarelto Take 1 tablet (20 mg total) by mouth daily with supper.   thiamine 100 MG tablet Take 1 tablet (100 mg total) by mouth daily.   trolamine salicylate 10 % cream Commonly known as: ASPERCREME Apply 1 application topically as needed for muscle pain.            Durable Medical Equipment  (From admission, onward)         Start     Ordered   08/26/19 1445  For home use only DME Hospital bed  Once       Question Answer Comment  Length of Need Lifetime   Patient has (list medical condition): metastatic hepatocellular carcinoma   The above medical condition requires: Patient requires the ability to reposition frequently  Bed type Semi-electric   Support Surface: Gel Overlay      08/26/19 [place], RN

## 2019-09-02 NOTE — Progress Notes (Signed)
Physical Therapy Treatment Patient Details Name: Kerry Adams MRN: [id number] DOB: 1956/02/03 Today's Date: 09/02/2019    History of Present Illness Kerry Adams is a 64 y.o. male with medical history of persistent atrial fibrillation, hypertension, alcohol dependence, and metastatic hepatocellular carcinoma to the lung, bone, and lymph nodes. He does drink quite a bit, 12 beers and 2 shots a day.  The patient was recently started on Tecentriq/Avastin -- start cycle #1 on 08/05/2019 for his hepatocellular carcinoma.  He also finished up radiation therapy to his right hip approximately 3 weeks before that.  He says that for the past 3 to 4 weeks he has experienced increasing generalized weakness and leg weakness.  He has had multiple falls.  In the past 2 to 3 days, the patient has had numerous falls the last being on 08/23/2019.  Since his falls, the patient has had increasing pain in his right hip and increasing difficulty with ambulation and getting up out of his chair.  He has been taking tramadol around-the-clock without significant help.  He has not had any fevers, chills, headache, chest pain, shortness breath, cough, hemoptysis, nausea, vomiting, diarrhea, abdominal pain.  Because of the increasing weakness and right hip pain, he presented for further evaluation.  He denies any dysuria, hematochezia, melena, hematuria.  He is compliant with all his medications.In the emergency department, the patient was afebrile and hemodynamically stable.  He was noted to have atrial fibrillation with RVR with a heart rate 120s.  The patient was given 2 IV pushes of metoprolol, 2.5, and then 5 mg.  Subsequently, diltiazem drip was started.  Patient was admitted for further evaluation and treatment.    PT Comments    Patient demonstrates slightly less shakiness when completing functional activity during supine to sitting and transferring to chair, able to take a few steps, but limited due to fatigue.   Patient's spouse present for family training in assisting bed mobility and transfers with understanding acknowledged to use RW and having patient do as much as he can.  Patient tolerated sitting up in chair after therapy with his spouse present in room.  Patient will benefit from continued physical therapy in hospital and recommended venue below to increase strength, balance, endurance for safe ADLs and gait.     Follow Up Recommendations  SNF;Supervision for mobility/OOB;Supervision - Intermittent     Equipment Recommendations  None recommended by PT    Recommendations for Other Services       Precautions / Restrictions Precautions Precautions: Fall Restrictions Weight Bearing Restrictions: No    Mobility  Bed Mobility   Bed Mobility: Supine to Sit     Supine to sit: Min assist     General bed mobility comments: slightly labored shaky movement, increased time  Transfers Overall transfer level: Needs assistance Equipment used: Rolling walker (2 wheeled) Transfers: Sit to/from [facility] Sit to Stand: Min assist Stand pivot transfers: Min assist       General transfer comment: increased time, unsteady on feet with labored movement  Ambulation/Gait Ambulation/Gait assistance: Mod assist Gait Distance (Feet): 5 Feet Assistive device: Rolling walker (2 wheeled) Gait Pattern/deviations: Decreased step length - right;Decreased step length - left;Decreased stride length Gait velocity: slow   General Gait Details: limited to 4-5 slow labored steps with mild shakiness secondary to fatigue   Stairs             Wheelchair Mobility    Modified Rankin (Stroke Patients Only)  Balance Overall balance assessment: Needs assistance Sitting-balance support: Feet supported;No upper extremity supported Sitting balance-Leahy Scale: Good Sitting balance - Comments: seated at EOB   Standing balance support: During functional activity;Bilateral  upper extremity supported Standing balance-Leahy Scale: Poor Standing balance comment: fair/poor using RW                            Cognition Arousal/Alertness: Awake/alert Behavior During Therapy: WFL for tasks assessed/performed Overall Cognitive Status: Within Functional Limits for tasks assessed                                        Exercises General Exercises - Lower Extremity Long Arc Quad: Seated;AROM;Strengthening;Both;10 reps Hip Flexion/Marching: Seated;AROM;Strengthening;Both;10 reps Toe Raises: Seated;AROM;Strengthening;Both;10 reps Heel Raises: Seated;AROM;Strengthening;Both;10 reps    General Comments        Pertinent Vitals/Pain Pain Assessment: Faces Faces Pain Scale: Hurts a little bit Pain Location: right hip Pain Descriptors / Indicators: Sore;Discomfort Pain Intervention(s): Limited activity within patient's tolerance;Monitored during session;Repositioned    Home Living                      Prior Function            PT Goals (current goals can now be found in the care plan section) Acute Rehab PT Goals Patient Stated Goal: return home with family to assist PT Goal Formulation: With patient/family Time For Goal Achievement: 09/09/19 Potential to Achieve Goals: Good    Frequency    Min 3X/week      PT Plan Current plan remains appropriate    Co-evaluation              AM-PAC PT "6 Clicks" Mobility   Outcome Measure  Help needed turning from your back to your side while in a flat bed without using bedrails?: None Help needed moving from lying on your back to sitting on the side of a flat bed without using bedrails?: A Little Help needed moving to and from a bed to a chair (including a wheelchair)?: A Lot Help needed standing up from a chair using your arms (e.g., wheelchair or bedside chair)?: A Little Help needed to walk in hospital room?: A Lot Help needed climbing 3-5 steps with a railing? :  Total 6 Click Score: 15    End of Session Equipment Utilized During Treatment: Gait belt Activity Tolerance: Patient tolerated treatment well;Patient limited by fatigue Patient left: in chair;with call bell/phone within reach;with family/visitor present Nurse Communication: Mobility status PT Visit Diagnosis: Unsteadiness on feet (R26.81);Other abnormalities of gait and mobility (R26.89);Muscle weakness (generalized) (M62.81)     Time: 3300-7622 PT Time Calculation (min) (ACUTE ONLY): 21 min  Charges:  $Therapeutic Exercise: 8-22 mins $Therapeutic Activity: 8-22 mins                     12:28 PM, 09/02/19 Lonell Grandchild, MPT Physical Therapist with [facility] [phone] office [phone] mobile phone

## 2019-09-02 NOTE — Progress Notes (Signed)
CSW notified by [name] Basta with [facility] orders had not yet been entered. Hospitalist updated and [place] orders are in. CSW updated [facility]. TOC signing off.  Tobi Bastos, LCSW Transitions of Care Clinical Social Worker [facility] Ph: [phone]

## 2019-09-02 NOTE — Telephone Encounter (Signed)
Wife called and stated,"I wanted Dr. Marin Olp and Roselyn Reef to know that Kerry Adams has been discharged from the hospital to home. Do not call back he is resting. I just wanted you all to know. Thank You."

## 2019-09-02 NOTE — Care Management Important Message (Signed)
Important Message  Patient Details  Name: Kerry Adams MRN: [id number] Date of Birth: [birth year]   Medicare Important Message Given:  Yes     Tommy Medal 09/02/2019, 11:43 AM

## 2019-09-02 NOTE — Discharge Instructions (Signed)
Atrial Fibrillation  Atrial fibrillation is a type of heartbeat that is irregular or fast. If you have this condition, your heart beats without any order. This makes it hard for your heart to pump blood in a normal way. Atrial fibrillation may come and go, or it may become a long-lasting problem. If this condition is not treated, it can put you at higher risk for stroke, heart failure, and other heart problems. What are the causes? This condition may be caused by diseases that damage the heart. They include:  High blood pressure.  Heart failure.  Heart valve disease.  Heart surgery. Other causes include:  Diabetes.  Thyroid disease.  Being overweight.  Kidney disease. Sometimes the cause is not known. What increases the risk? You are more likely to develop this condition if:  You are older.  You smoke.  You exercise often and very hard.  You have a family history of this condition.  You are a man.  You use drugs.  You drink a lot of alcohol.  You have lung conditions, such as emphysema, pneumonia, or COPD.  You have sleep apnea. What are the signs or symptoms? Common symptoms of this condition include:  A feeling that your heart is beating very fast.  Chest pain or discomfort.  Feeling short of breath.  Suddenly feeling light-headed or weak.  Getting tired easily during activity.  Fainting.  Sweating. In some cases, there are no symptoms. How is this treated? Treatment for this condition depends on underlying conditions and how you feel when you have atrial fibrillation. They include:  Medicines to: ? Prevent blood clots. ? Treat heart rate or heart rhythm problems.  Using devices, such as a pacemaker, to correct heart rhythm problems.  Doing surgery to remove the part of the heart that sends bad signals.  Closing an area where clots can form in the heart (left atrial appendage). In some cases, your doctor will treat other underlying  conditions. Follow these instructions at home: Medicines  Take over-the-counter and prescription medicines only as told by your doctor.  Do not take any new medicines without first talking to your doctor.  If you are taking blood thinners: ? Talk with your doctor before you take any medicines that have aspirin or NSAIDs, such as ibuprofen, in them. ? Take your medicine exactly as told by your doctor. Take it at the same time each day. ? Avoid activities that could hurt or bruise you. Follow instructions about how to prevent falls. ? Wear a bracelet that says you are taking blood thinners. Or, carry a card that lists what medicines you take. Lifestyle      Do not use any products that have nicotine or tobacco in them. These include cigarettes, e-cigarettes, and chewing tobacco. If you need help quitting, ask your doctor.  Eat heart-healthy foods. Talk with your doctor about the right eating plan for you.  Exercise regularly as told by your doctor.  Do not drink alcohol.  Lose weight if you are overweight.  Do not use drugs, including cannabis. General instructions  If you have a condition that causes breathing to stop for a short period of time (apnea), treat it as told by your doctor.  Keep a healthy weight. Do not use diet pills unless your doctor says they are safe for you. Diet pills may make heart problems worse.  Keep all follow-up visits as told by your doctor. This is important. Contact a doctor if:  You notice a change  in the speed, rhythm, or strength of your heartbeat.  You are taking a blood-thinning medicine and you get more bruising.  You get tired more easily when you move or exercise.  You have a sudden change in weight. Get help right away if:   You have pain in your chest or your belly (abdomen).  You have trouble breathing.  You have side effects of blood thinners, such as blood in your vomit, poop (stool), or pee (urine), or bleeding that cannot  stop.  You have any signs of a stroke. "BE FAST" is an easy way to remember the main warning signs: ? B - Balance. Signs are dizziness, sudden trouble walking, or loss of balance. ? E - Eyes. Signs are trouble seeing or a change in how you see. ? F - Face. Signs are sudden weakness or loss of feeling in the face, or the face or eyelid drooping on one side. ? A - Arms. Signs are weakness or loss of feeling in an arm. This happens suddenly and usually on one side of the body. ? S - Speech. Signs are sudden trouble speaking, slurred speech, or trouble understanding what people say. ? T - Time. Time to call emergency services. Write down what time symptoms started.  You have other signs of a stroke, such as: ? A sudden, very bad headache with no known cause. ? Feeling like you may vomit (nausea). ? Vomiting. ? A seizure. These symptoms may be an emergency. Do not wait to see if the symptoms will go away. Get medical help right away. Call your local emergency services (911 in the [place] Do not drive yourself to the hospital. Summary  Atrial fibrillation is a type of heartbeat that is irregular or fast.  You are at higher risk of this condition if you smoke, are older, have diabetes, or are overweight.  Follow your doctor's instructions about medicines, diet, exercise, and follow-up visits.  Get help right away if you have signs or symptoms of a stroke.  Get help right away if you cannot catch your breath, or you have chest pain or discomfort. This information is not intended to replace advice given to you by your health care provider. Make sure you discuss any questions you have with your health care provider. Document Revised: 08/04/2018 Document Reviewed: 08/04/2018 Elsevier Patient Education  [place].     IMPORTANT INFORMATION: PAY CLOSE ATTENTION   PHYSICIAN DISCHARGE INSTRUCTIONS  Follow with Primary care provider  Vicenta Aly, [facility]  and other consultants as  instructed by your Hospitalist Physician  [place] IF SYMPTOMS COME BACK, WORSEN OR NEW PROBLEM DEVELOPS   Please note: You were cared for by a hospitalist during your hospital stay. Every effort will be made to forward records to your primary care provider.  You can request that your primary care provider send for your hospital records if they have not received them.  Once you are discharged, your primary care physician will handle any further medical issues. Please note that NO REFILLS for any discharge medications will be authorized once you are discharged, as it is imperative that you return to your primary care physician (or establish a relationship with a primary care physician if you do not have one) for your post hospital discharge needs so that they can reassess your need for medications and monitor your lab values.  Please get a complete blood count and chemistry panel checked by your Primary MD  at your next visit, and again as instructed by your Primary MD.  Get Medicines reviewed and adjusted: Please take all your medications with you for your next visit with your Primary MD  Laboratory/radiological data: Please request your Primary MD to go over all hospital tests and procedure/radiological results at the follow up, please ask your primary care provider to get all Hospital records sent to his/her office.  In some cases, they will be blood work, cultures and biopsy results pending at the time of your discharge. Please request that your primary care provider follow up on these results.  If you are diabetic, please bring your blood sugar readings with you to your follow up appointment with primary care.    Please call and make your follow up appointments as soon as possible.    Also Note the following: If you experience worsening of your admission symptoms, develop shortness of breath, life threatening emergency, suicidal or homicidal thoughts you  must seek medical attention immediately by calling 911 or calling your MD immediately  if symptoms less severe.  You must read complete instructions/literature along with all the possible adverse reactions/side effects for all the Medicines you take and that have been prescribed to you. Take any new Medicines after you have completely understood and accpet all the possible adverse reactions/side effects.   Do not drive when taking Pain medications or sleeping medications (Benzodiazepines)  Do not take more than prescribed Pain, Sleep and Anxiety Medications. It is not advisable to combine anxiety,sleep and pain medications without talking with your primary care practitioner  Special Instructions: If you have smoked or chewed Tobacco  in the last 2 yrs please stop smoking, stop any regular Alcohol  and or any Recreational drug use.  Wear Seat belts while driving.  Do not drive if taking any narcotic, mind altering or controlled substances or recreational drugs or alcohol.

## 2019-09-05 ENCOUNTER — Ambulatory Visit: Payer: PRIVATE HEALTH INSURANCE | Admitting: Radiation Oncology

## 2019-09-13 ENCOUNTER — Telehealth: Payer: Self-pay | Admitting: *Deleted

## 2019-09-13 NOTE — Telephone Encounter (Signed)
Message received from Olivia Mackie, Education officer, museum with [employer] requesting a verbal order for a Palliative Care referral from Dr. Marin Olp.  Call placed back to [facility] and verbal order given per Dr. Marin Olp for palliative care referral.  Olivia Mackie is appreciative of call back and has no other questions at this time.

## 2019-09-14 ENCOUNTER — Telehealth: Payer: Self-pay | Admitting: Internal Medicine

## 2019-09-14 NOTE — Telephone Encounter (Signed)
[facility] Palliative visit scheduled 09-20-19 at 1:00.

## 2019-09-16 ENCOUNTER — Encounter: Payer: Self-pay | Admitting: *Deleted

## 2019-09-16 ENCOUNTER — Other Ambulatory Visit: Payer: Self-pay

## 2019-09-16 ENCOUNTER — Other Ambulatory Visit: Payer: Medicare Other

## 2019-09-16 ENCOUNTER — Inpatient Hospital Stay (HOSPITAL_BASED_OUTPATIENT_CLINIC_OR_DEPARTMENT_OTHER): Payer: Medicare Other | Admitting: Hematology & Oncology

## 2019-09-16 ENCOUNTER — Inpatient Hospital Stay: Payer: Medicare Other | Attending: Hematology & Oncology

## 2019-09-16 ENCOUNTER — Encounter: Payer: Self-pay | Admitting: Hematology & Oncology

## 2019-09-16 ENCOUNTER — Inpatient Hospital Stay: Payer: Medicare Other

## 2019-09-16 VITALS — BP 103/74 | HR 81 | Temp 97.4°F | Resp 20 | Wt 190.8 lb

## 2019-09-16 VITALS — BP 110/84 | HR 78 | Resp 17

## 2019-09-16 DIAGNOSIS — Z5112 Encounter for antineoplastic immunotherapy: Secondary | ICD-10-CM | POA: Insufficient documentation

## 2019-09-16 DIAGNOSIS — Z79899 Other long term (current) drug therapy: Secondary | ICD-10-CM | POA: Diagnosis not present

## 2019-09-16 DIAGNOSIS — I2510 Atherosclerotic heart disease of native coronary artery without angina pectoris: Secondary | ICD-10-CM | POA: Diagnosis not present

## 2019-09-16 DIAGNOSIS — C220 Liver cell carcinoma: Secondary | ICD-10-CM

## 2019-09-16 DIAGNOSIS — C7800 Secondary malignant neoplasm of unspecified lung: Secondary | ICD-10-CM | POA: Diagnosis not present

## 2019-09-16 DIAGNOSIS — Z923 Personal history of irradiation: Secondary | ICD-10-CM | POA: Insufficient documentation

## 2019-09-16 DIAGNOSIS — C7951 Secondary malignant neoplasm of bone: Secondary | ICD-10-CM | POA: Insufficient documentation

## 2019-09-16 DIAGNOSIS — R531 Weakness: Secondary | ICD-10-CM | POA: Insufficient documentation

## 2019-09-16 DIAGNOSIS — C7801 Secondary malignant neoplasm of right lung: Secondary | ICD-10-CM | POA: Diagnosis not present

## 2019-09-16 LAB — CMP (CANCER CENTER ONLY)
ALT: 26 U/L (ref 0–44)
AST: 43 U/L — ABNORMAL HIGH (ref 15–41)
Albumin: 2.8 g/dL — ABNORMAL LOW (ref 3.5–5.0)
Alkaline Phosphatase: 169 U/L — ABNORMAL HIGH (ref 38–126)
Anion gap: 5 (ref 5–15)
BUN: 6 mg/dL — ABNORMAL LOW (ref 8–23)
CO2: 27 mmol/L (ref 22–32)
Calcium: 8.3 mg/dL — ABNORMAL LOW (ref 8.9–10.3)
Chloride: 102 mmol/L (ref 98–111)
Creatinine: 0.58 mg/dL — ABNORMAL LOW (ref 0.61–1.24)
GFR, Est AFR Am: >60 mL/min (ref >60)
GFR, Estimated: >60 mL/min (ref >60)
Glucose, Bld: 88 mg/dL (ref 70–99)
Potassium: 4.6 mmol/L (ref 3.5–5.1)
Sodium: 134 mmol/L — ABNORMAL LOW (ref 135–145)
Total Bilirubin: 1.7 mg/dL — ABNORMAL HIGH (ref 0.3–1.2)
Total Protein: 6.1 g/dL — ABNORMAL LOW (ref 6.5–8.1)

## 2019-09-16 LAB — CBC WITH DIFFERENTIAL (CANCER CENTER ONLY)
Abs Immature Granulocytes: 0.02 10*3/uL (ref 0.00–0.07)
Basophils Absolute: 0.1 10*3/uL (ref 0.0–0.1)
Basophils Relative: 1 %
Eosinophils Absolute: 0.2 10*3/uL (ref 0.0–0.5)
Eosinophils Relative: 3 %
HCT: 42.3 % (ref 39.0–52.0)
Hemoglobin: 13.6 g/dL (ref 13.0–17.0)
Immature Granulocytes: 0 %
Lymphocytes Relative: 21 %
Lymphs Abs: 1.3 10*3/uL (ref 0.7–4.0)
MCH: 31.9 pg (ref 26.0–34.0)
MCHC: 32.2 g/dL (ref 30.0–36.0)
MCV: 99.3 fL (ref 80.0–100.0)
Monocytes Absolute: 1 10*3/uL (ref 0.1–1.0)
Monocytes Relative: 16 %
Neutro Abs: 3.6 10*3/uL (ref 1.7–7.7)
Neutrophils Relative %: 59 %
Platelet Count: 157 10*3/uL (ref 150–400)
RBC: 4.26 MIL/uL (ref 4.22–5.81)
RDW: 14.1 % (ref 11.5–15.5)
WBC Count: 6 10*3/uL (ref 4.0–10.5)
nRBC: 0 % (ref 0.0–0.2)

## 2019-09-16 LAB — TSH: TSH: 2.131 u[IU]/mL (ref 0.320–4.118)

## 2019-09-16 LAB — TOTAL PROTEIN, URINE DIPSTICK: Protein, ur: NEGATIVE mg/dL

## 2019-09-16 LAB — LACTATE DEHYDROGENASE: LDH: 233 U/L — ABNORMAL HIGH (ref 98–192)

## 2019-09-16 MED ORDER — TRAMADOL HCL 50 MG PO TABS: 50.0000 mg | ORAL_TABLET | Freq: Four times a day (QID) | ORAL | 0 refills | Status: DC | PRN

## 2019-09-16 MED ORDER — SODIUM CHLORIDE 0.9 % IV SOLN
1200.0000 mg | Freq: Once | INTRAVENOUS | Status: AC
  Administered 2019-09-16: 1200 mg via INTRAVENOUS
  Filled 2019-09-16: qty 20

## 2019-09-16 MED ORDER — SODIUM CHLORIDE 0.9 % IV SOLN
Freq: Once | INTRAVENOUS | Status: AC
  Filled 2019-09-16: qty 250

## 2019-09-16 MED ORDER — SODIUM CHLORIDE 0.9 % IV SOLN
15.0000 mg/kg | Freq: Once | INTRAVENOUS | Status: AC
  Administered 2019-09-16: 1400 mg via INTRAVENOUS
  Filled 2019-09-16: qty 48

## 2019-09-16 NOTE — Patient Instructions (Signed)
[facility] Discharge Instructions for Patients Receiving Chemotherapy  Today you received the following chemotherapy agents Tecentriq and Becacizumab  To help prevent nausea and vomiting after your treatment, we encourage you to take your nausea medication as prescribed by MD.   If you develop nausea and vomiting that is not controlled by your nausea medication, call the clinic.   BELOW ARE SYMPTOMS THAT SHOULD BE REPORTED IMMEDIATELY:  *FEVER GREATER THAN 100.5 F  *CHILLS WITH OR WITHOUT FEVER  NAUSEA AND VOMITING THAT IS NOT CONTROLLED WITH YOUR NAUSEA MEDICATION  *UNUSUAL SHORTNESS OF BREATH  *UNUSUAL BRUISING OR BLEEDING  TENDERNESS IN MOUTH AND THROAT WITH OR WITHOUT PRESENCE OF ULCERS  *URINARY PROBLEMS  *BOWEL PROBLEMS  UNUSUAL RASH Items with * indicate a potential emergency and should be followed up as soon as possible.  Feel free to call the clinic should you have any questions or concerns. The clinic phone number is [phone].  Please show the CHEMO ALERT CARD at check-in to the Emergency Department and triage nurse.   

## 2019-09-16 NOTE — Progress Notes (Signed)
Hematology and Oncology Follow Up Visit  Kerry Adams [id number] 1955/05/23 64 y.o. 09/16/2019   Principle Diagnosis:  Hepatocellular carcinoma -- mets to bone, lung, lymph nodes  Current Therapy:        Palliative Radiation to the RIGHT femur Tecentriq/Avastin -- started 08/05/2019, s/p cycle 1 Xgeva 120 mg sq q 3 months -- next dose in 09/2019   Interim History:  Kerry Adams is here today with his wife.  Surprisingly, he was admitted to [facility] a few weeks ago.  He was weak.  He apparently had been drinking quite a bit.  He now is not drinking alcohol.  He went into detox.  This was difficult for him but he is feeling better now.  He fell.  Thankfully he did not break anything.  He had the tumor in the right hip.  He had radiation for this.  He really has not had treatment now probably for 6 weeks.  We have not be able to treat him because of other health issues.  He actually looks quite good.  He does not have leg swelling.  He uses tramadol for pain.  His appetite is better.  I am just happy that he stopped drinking.  Of note, back in June, his alpha-fetoprotein was 406.  I currently would have to say his performance status is probably ECOG 2.  Medications:  Allergies as of 09/16/2019   No Known Allergies     Medication List       Accurate as of September 16, 2019 10:57 AM. If you have any questions, ask your nurse or doctor.        acetaminophen 500 MG tablet Commonly known as: TYLENOL Take 500 mg by mouth every 8 (eight) hours as needed (pain).   diazepam 2 MG tablet Commonly known as: VALIUM Take 1 tablet (2 mg total) by mouth every 12 (twelve) hours as needed for anxiety.   famotidine 10 MG tablet Commonly known as: PEPCID Take 10 mg by mouth every morning.   folic acid 1 MG tablet Commonly known as: FOLVITE Take 1 tablet (1 mg total) by mouth daily.   Imodium A-D 2 MG tablet Generic drug: loperamide Take 1 mg by mouth at bedtime as needed  for diarrhea or loose stools. Takes 1/2 tablet total of 1 mg at bedtime.   metoprolol tartrate 100 MG tablet Commonly known as: LOPRESSOR Take 1 tablet (100 mg total) by mouth 2 (two) times daily.   multivitamin with minerals Tabs tablet Take 1 tablet by mouth daily.   omeprazole 20 MG capsule Commonly known as: PriLOSEC Take 1 capsule (20 mg total) by mouth daily.   oxybutynin 5 MG tablet Commonly known as: DITROPAN Take 1 tablet (5 mg total) by mouth 3 (three) times daily.   oxycodone 5 MG capsule Commonly known as: OXY-IR Take 5 mg by mouth 4 (four) times daily as needed.   polyethylene glycol 17 g packet Commonly known as: MIRALAX / GLYCOLAX Take 17 g by mouth daily as needed for mild constipation.   Potassium 99 MG Tabs Take 99 mg by mouth daily.   rivaroxaban 20 MG Tabs tablet Commonly known as: Xarelto Take 1 tablet (20 mg total) by mouth daily with supper.   thiamine 100 MG tablet Take 1 tablet (100 mg total) by mouth daily.   traMADol 50 MG tablet Commonly known as: ULTRAM Take 50 mg by mouth every 6 (six) hours as needed.   trolamine salicylate 10 % cream  Commonly known as: ASPERCREME Apply 1 application topically as needed for muscle pain.       Allergies: No Known Allergies  Past Medical History, Surgical history, Social history, and Family History were reviewed and updated.  Review of Systems: Review of Systems  Constitutional: Negative.   HENT: Negative.   Eyes: Negative.   Respiratory: Negative.   Cardiovascular: Negative.   Gastrointestinal: Negative.   Genitourinary: Negative.   Musculoskeletal: Positive for back pain, falls and joint pain.  Skin: Negative.   Neurological: Positive for focal weakness.  Endo/Heme/Allergies: Negative.   Psychiatric/Behavioral: Negative.      Physical Exam:  weight is 190 lb 13.6 oz (86.6 kg). His oral temperature is 97.4 F (36.3 C) (abnormal). His blood pressure is 103/74 and his pulse is 81. His  respiration is 20 and oxygen saturation is 100%.   Wt Readings from Last 3 Encounters:  09/16/19 190 lb 13.6 oz (86.6 kg)  09/01/19 198 lb 13.7 oz (90.2 kg)  08/18/19 209 lb 1.9 oz (94.9 kg)    Physical Exam Vitals reviewed.  HENT:     Head: Normocephalic and atraumatic.  Eyes:     Pupils: Pupils are equal, round, and reactive to light.  Cardiovascular:     Rate and Rhythm: Normal rate and regular rhythm.     Heart sounds: Normal heart sounds.  Pulmonary:     Effort: Pulmonary effort is normal.     Breath sounds: Normal breath sounds.  Abdominal:     General: Bowel sounds are normal.     Palpations: Abdomen is soft.  Musculoskeletal:        General: No tenderness or deformity. Normal range of motion.     Cervical back: Normal range of motion.  Lymphadenopathy:     Cervical: No cervical adenopathy.  Skin:    General: Skin is warm and dry.     Findings: No erythema or rash.  Neurological:     Mental Status: He is alert and oriented to person, place, and time.  Psychiatric:        Behavior: Behavior normal.        Thought Content: Thought content normal.        Judgment: Judgment normal.      Lab Results  Component Value Date   WBC 6.0 09/16/2019   HGB 13.6 09/16/2019   HCT 42.3 09/16/2019   MCV 99.3 09/16/2019   PLT 157 09/16/2019   Lab Results  Component Value Date   FERRITIN 738 (H) 08/18/2019   IRON 63 08/18/2019   TIBC 341 08/18/2019   UIBC 278 08/18/2019   IRONPCTSAT 18 (L) 08/18/2019   Lab Results  Component Value Date   RBC 4.26 09/16/2019   Lab Results  Component Value Date   KPAFRELGTCHN 23.3 (H) 06/20/2019   LAMBDASER 19.9 06/20/2019   KAPLAMBRATIO 1.17 06/20/2019   Lab Results  Component Value Date   IGGSERUM 1,078 06/20/2019   IGA 457 (H) 06/20/2019   IGMSERUM 80 06/20/2019   Lab Results  Component Value Date   TOTALPROTELP 6.2 06/20/2019   ALBUMINELP 3.2 06/20/2019   A1GS 0.3 06/20/2019   A2GS 0.6 06/20/2019   BETS 1.2  06/20/2019   GAMS 0.9 06/20/2019   MSPIKE Not Observed 06/20/2019     Chemistry      Component Value Date/Time   NA 134 (L) 09/16/2019 0931   NA 137 05/13/2019 1110   K 4.6 09/16/2019 0931   CL 102 09/16/2019 0931   CO2 27  09/16/2019 0931   BUN 6 (L) 09/16/2019 0931   BUN 7 (L) 05/13/2019 1110   CREATININE 0.58 (L) 09/16/2019 0931      Component Value Date/Time   CALCIUM 8.3 (L) 09/16/2019 0931   ALKPHOS 169 (H) 09/16/2019 0931   AST 43 (H) 09/16/2019 0931   ALT 26 09/16/2019 0931   BILITOT 1.7 (H) 09/16/2019 0931       Impression and Plan: Mr. Strothers is a very pleasant 64 yo caucasian gentleman with metastatic hepatocellular carcinoma.    Again, I am just happy that he is doing better.  He certainly does look a little bit better.  Hopefully, we can get him back onto treatment with Tecentriq/Avastin.  We will see what his alpha-fetoprotein level is.  I would be shocked if it is anything lower than 400.  We will try to keep him on schedule.  I spent about 30 minutes with he and his wife.  We had to go through all the labs then records from his admission to [facility].  I think he understands now how bad alcohol was for him and how it was affecting his liver and overall quality of life.  He is very motivated now not to drink.  Marland Kitchen   Volanda Napoleon, MD 7/23/202110:57 AM

## 2019-09-16 NOTE — Progress Notes (Signed)
Visited with patient and his wife. Kerry Adams is feeling better today approx 2 weeks out from his recent hospitalization. He states his home PT is going well and he feels like he is getting stronger. He also reports staying sober since his discharge. His wife confirms this. He is eating better. Asked about supplementation and he states he does not yet take anything, but his wife states she has been looking for strawberry ensure as patient enjoys this. Samples of strawberry Ensure given to patient.   Oncology Nurse Navigator Documentation  Oncology Nurse Navigator Flowsheets 09/16/2019  Abnormal Finding Date -  Confirmed Diagnosis Date -  Diagnosis Status -  Planned Course of Treatment -  Phase of Treatment -  Chemotherapy Actual Start Date: -  Chemo/Radiation Concurrent Pending- Reason: -  Radiation Actual Start Date: -  Navigator Follow Up Date: 10/07/2019  Navigator Follow Up Reason: Follow-up Appointment;Chemotherapy  Navigator Location [facility]  Referral Date to RadOnc/MedOnc -  Navigator Encounter Type Follow-up Appt  Telephone -  Treatment Initiated Date -  Patient Visit Type MedOnc  Treatment Phase Active Tx  Barriers/Navigation Needs Anxiety;Food Disparities;Mobility Issues;Morbidities/Frailty;Multiple Hospital Admissions;Pain  Education -  Interventions Psycho-Social Support  Acuity Level 2-Minimal Needs (1-2 Barriers Identified)  Referrals -  Coordination of Care -  Education Method -  Support Groups/Services Friends and Family  Time Spent with Patient 30

## 2019-09-17 LAB — T4: T4, Total: 5.6 ug/dL (ref 4.5–12.0)

## 2019-09-17 LAB — AFP TUMOR MARKER: AFP, Serum, Tumor Marker: 136 ng/mL — ABNORMAL HIGH (ref 0.0–8.3)

## 2019-09-19 ENCOUNTER — Other Ambulatory Visit: Payer: Self-pay | Admitting: Hematology & Oncology

## 2019-09-19 ENCOUNTER — Telehealth: Payer: Self-pay | Admitting: Hematology & Oncology

## 2019-09-19 NOTE — Telephone Encounter (Signed)
Appointments scheduled calendar printed & mailed per 7/23 los 

## 2019-09-20 ENCOUNTER — Other Ambulatory Visit: Payer: Medicare Other | Admitting: Internal Medicine

## 2019-09-20 ENCOUNTER — Encounter: Payer: Self-pay | Admitting: Internal Medicine

## 2019-09-20 ENCOUNTER — Other Ambulatory Visit: Payer: Self-pay

## 2019-09-20 DIAGNOSIS — Z515 Encounter for palliative care: Secondary | ICD-10-CM

## 2019-09-20 DIAGNOSIS — Z7189 Other specified counseling: Secondary | ICD-10-CM

## 2019-09-20 NOTE — Progress Notes (Signed)
July 27th, 2021 [facility] Palliative Care Consult Note Telephone: [phone]  Fax: [phone]  PATIENT NAME: Kerry Adams DOB: 1955/12/23 MRN: [id number]  [place] [id number]  PRIMARY CARE PROVIDER:  Vicenta Aly, [address],  [place] [address]  REFERRING PROVIDER: Dr. Burney Gauze  RESPONSIBLE PARTY:   Friday,Carol (Spouse)  [phone] ([place])  Dr. Curt Bears (Cardiology) Spouse plans to call for f/u OV.   ASSESSMENT / RECOMMENDATIONS:  1. Advance Care Planning: A. Directives: At this time, patient would wish for full resuscitative efforts in the event of a cardiopulmonary arrest. We discussed component of the MOST form. I left copy along with patient educational materials in the home for them to discuss further. MOST form selections may change as patient condition changes. B. Goals of Care:. -Wants to continue traveling (interrupted by COVID).  -Family understands plan is to do 3 immunotherapy infusions then restaging scan. Further plans pending those scan results.  -Want to follow up with cardiology regarding management of heart rate setting of atrial fibrillation.  -Motivated to work with PT to increase endurance and mobility. Wants to be able to mow his lawn on his riding mower.  2. Cognitive / Functional status, Symptom Management: Patient is A & O x 3. Knowledgeable regarding his disease process and course of treatment. He has totally abstained from alcohol since his discharge from [facility].  Working diligently with physical therapist and feels he is gaining in strength. He is ambulating about with his walker, and uses consistently to ensure he doesn't fall, and to help keep weight off of his L hip (mets to L trochanter). Not doing chores about home.   Notes easy bruising and bleeding d/t Xarelto, which he is on d/t persistent atrial fibrillation. HR controlled on Lopressor  bid. He's not aware when he is out of rhythm.   Good appetite. Mild stomach queasiness s/p second chemo infusion; felt better after Zofran. Symptoms of GERD will managed on Pepcid and Prilosec. Weight 190 lbs,  BMI 25.18kg/m2.  Pain in R hip (metastasis; ongoing palliative radiation). Is feeling incremental improvement.  Managed with Tramadol bid. Exacerbated when sitting up or standing. Also has some back pain at night which the Tramadol helps to address as well. Trialed Asprecreme and ice applications in the past but not particularly helpful.  No difficulty with urination. Constipation managed with prn MiraLax.  3. Family / Community Supports: Neighbor Mercie Eon (former hospice AV nurse) visits frequently and is a huge source of support. Married to Virden x 15 yrs. No children; step kids on both patient's and spouse's side. Patient's step-children are not in contact. Step-children on Carol's side not in frequent contact but maintain a good relationship. Patient used to work as a Aeronautical engineer, now retired. Patient is visited by SN q wk, PT  2x/week.   4. Follow up Palliative Care Visit: Family will call on a prn basis for symptom management or to complete MOST form. They have my contact information.  I spent 60 minutes providing this consultation from 1pm to 2pm. More than 50% of the time in this consultation was spent coordinating communication.   HISTORY OF PRESENT ILLNESS:  Kerry Adams is a 64 y.o.  male with metastatic (to R lung/bone/lymph nodes) hepatocellular carcinoma, persistent atrial fibrillation, HTN, ETOH dependence, delirium tremens. GERD.  -08/25/2019 hospitalized with weakness, pain, hyponatremia (hypovolemic/SIADH/ETOH). R hip x-ray: persistent destructive lesion greater trochanter prox R  femur with extension into the intertrochanteric region. Palliative XRT started May 2021.  Palliative Care was asked to help address goals of care.   CODE STATUS: full code  PPS:  60%  HOSPICE ELIGIBILITY/DIAGNOSIS: TBD  PAST MEDICAL HISTORY:  Past Medical History:  Diagnosis Date  . Abnormal EKG   . Alcohol use 02/18/2014  . Arthritis    osteo knees and hips   . Atrial fibrillation ([place])   . CAD (coronary artery disease)    WITHOUT ANGINA PECTORIS, UNSPECIFIED VESSEL OR LESION TYPE, UNSPECIFIED WHETHER NATIVE OR TRANSPLANTED HEART  . 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 Chronic liver disease   . Cirrhosis ([place])   . Closed right hip fracture ([place]) 02/18/2014  . Dizziness   . Dysrhythmia   . Elevated liver function tests   . GERD (gastroesophageal reflux disease)    treats with OTC Ranitidine  . Goals of care, counseling/discussion 06/20/2019  . Hepatocellular carcinoma metastatic to lung ([place]) 07/08/2019  . Hepatocellular carcinoma metastatic to lymph nodes of multiple sites ([place]) 07/08/2019  . Hip fracture requiring operative repair ([place]) 02/19/2014  . Hyperlipidemia   . Hypertension   . Metastasis to right adrenal gland of unknown origin ([place]) 06/20/2019  . Metastatic sarcoma to liver ([place]) 06/20/2019  . 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 Osteoarthritis of knee, unilateral 03/01/2015  . Primary osteoarthritis of right knee 05/23/2015  . Thrombocytopenia ([place])     SOCIAL HX:  Social History   Tobacco Use  . Smoking status: Never Smoker  . Smokeless tobacco: Never Used  Substance Use Topics  . Alcohol use: Yes    Alcohol/week: 32.0 standard drinks    Types: 32 Cans of beer per week    Comment: Zero since 08/25/19.    ALLERGIES: No Known Allergies   PERTINENT MEDICATIONS:  Outpatient Encounter Medications as of 09/20/2019  Medication Sig  . acetaminophen (TYLENOL) 500 MG tablet Take 500 mg by mouth every 8 (eight) hours as needed (pain).  . diazepam (VALIUM) 2 MG tablet Take 1 tablet (2 mg total) by mouth every 12 (twelve) hours as needed for anxiety. (Patient not taking: Reported on 09/16/2019)  . famotidine (PEPCID) 10  MG tablet Take 10 mg by mouth every morning.   . folic acid (FOLVITE) 1 MG tablet Take 1 tablet (1 mg total) by mouth daily.  Marland Kitchen loperamide (IMODIUM A-D) 2 MG tablet Take 1 mg by mouth at bedtime as needed for diarrhea or loose stools. Takes 1/2 tablet total of 1 mg at bedtime. (Patient not taking: Reported on 09/16/2019)  . metoprolol tartrate (LOPRESSOR) 100 MG tablet Take 1 tablet (100 mg total) by mouth 2 (two) times daily.  . Multiple Vitamin (MULTIVITAMIN WITH MINERALS) TABS tablet Take 1 tablet by mouth daily.  Marland Kitchen omeprazole (PRILOSEC) 20 MG capsule Take 1 capsule (20 mg total) by mouth daily.  Marland Kitchen oxybutynin (DITROPAN) 5 MG tablet Take 1 tablet (5 mg total) by mouth 3 (three) times daily.  Marland Kitchen oxycodone (OXY-IR) 5 MG capsule Take 5 mg by mouth 4 (four) times daily as needed.  . polyethylene glycol (MIRALAX / GLYCOLAX) 17 g packet Take 17 g by mouth daily as needed for mild constipation. (Patient not taking: Reported on 09/16/2019)  . Potassium 99 MG TABS Take 99 mg by mouth daily.  . rivaroxaban (XARELTO) 20 MG TABS tablet Take 1 tablet (20 mg total) by mouth daily with supper.  . thiamine 100 MG tablet Take 1 tablet (100  mg total) by mouth daily.  . traMADol (ULTRAM) 50 MG tablet Take 1 tablet (50 mg total) by mouth every 6 (six) hours as needed.  . trolamine salicylate (ASPERCREME) 10 % cream Apply 1 application topically as needed for muscle pain.   No facility-administered encounter medications on file as of 09/20/2019.   GENERAL EXAM:   Well nourished, older gentleman sitting in his recliner. Alert and pleasantly conversant.  Spouse Carol in attendance.   Julianne Handler, NP  [place]

## 2019-10-07 ENCOUNTER — Inpatient Hospital Stay: Payer: Medicare Other | Attending: Hematology & Oncology

## 2019-10-07 ENCOUNTER — Encounter: Payer: Self-pay | Admitting: *Deleted

## 2019-10-07 ENCOUNTER — Inpatient Hospital Stay: Payer: Medicare Other

## 2019-10-07 ENCOUNTER — Inpatient Hospital Stay (HOSPITAL_BASED_OUTPATIENT_CLINIC_OR_DEPARTMENT_OTHER): Payer: Medicare Other | Admitting: Hematology & Oncology

## 2019-10-07 ENCOUNTER — Other Ambulatory Visit: Payer: Self-pay

## 2019-10-07 ENCOUNTER — Encounter: Payer: Self-pay | Admitting: Hematology & Oncology

## 2019-10-07 VITALS — BP 122/87 | HR 92 | Temp 97.4°F | Resp 20 | Wt 185.0 lb

## 2019-10-07 DIAGNOSIS — C7951 Secondary malignant neoplasm of bone: Secondary | ICD-10-CM | POA: Insufficient documentation

## 2019-10-07 DIAGNOSIS — C7800 Secondary malignant neoplasm of unspecified lung: Secondary | ICD-10-CM | POA: Diagnosis not present

## 2019-10-07 DIAGNOSIS — C220 Liver cell carcinoma: Secondary | ICD-10-CM

## 2019-10-07 DIAGNOSIS — R600 Localized edema: Secondary | ICD-10-CM | POA: Insufficient documentation

## 2019-10-07 DIAGNOSIS — Z5112 Encounter for antineoplastic immunotherapy: Secondary | ICD-10-CM | POA: Diagnosis present

## 2019-10-07 DIAGNOSIS — C7801 Secondary malignant neoplasm of right lung: Secondary | ICD-10-CM | POA: Diagnosis not present

## 2019-10-07 DIAGNOSIS — I2510 Atherosclerotic heart disease of native coronary artery without angina pectoris: Secondary | ICD-10-CM | POA: Diagnosis not present

## 2019-10-07 DIAGNOSIS — Z79899 Other long term (current) drug therapy: Secondary | ICD-10-CM | POA: Insufficient documentation

## 2019-10-07 DIAGNOSIS — R490 Dysphonia: Secondary | ICD-10-CM | POA: Diagnosis not present

## 2019-10-07 LAB — CMP (CANCER CENTER ONLY)
ALT: 25 U/L (ref 0–44)
AST: 49 U/L — ABNORMAL HIGH (ref 15–41)
Albumin: 2.9 g/dL — ABNORMAL LOW (ref 3.5–5.0)
Alkaline Phosphatase: 190 U/L — ABNORMAL HIGH (ref 38–126)
Anion gap: 6 (ref 5–15)
BUN: 8 mg/dL (ref 8–23)
CO2: 27 mmol/L (ref 22–32)
Calcium: 9.2 mg/dL (ref 8.9–10.3)
Chloride: 101 mmol/L (ref 98–111)
Creatinine: 0.63 mg/dL (ref 0.61–1.24)
GFR, Est AFR Am: >60 mL/min (ref >60)
GFR, Estimated: >60 mL/min (ref >60)
Glucose, Bld: 140 mg/dL — ABNORMAL HIGH (ref 70–99)
Potassium: 4.6 mmol/L (ref 3.5–5.1)
Sodium: 134 mmol/L — ABNORMAL LOW (ref 135–145)
Total Bilirubin: 1.9 mg/dL — ABNORMAL HIGH (ref 0.3–1.2)
Total Protein: 6.3 g/dL — ABNORMAL LOW (ref 6.5–8.1)

## 2019-10-07 LAB — CBC WITH DIFFERENTIAL (CANCER CENTER ONLY)
Abs Immature Granulocytes: 0.01 10*3/uL (ref 0.00–0.07)
Basophils Absolute: 0 10*3/uL (ref 0.0–0.1)
Basophils Relative: 1 %
Eosinophils Absolute: 0.1 10*3/uL (ref 0.0–0.5)
Eosinophils Relative: 2 %
HCT: 44.5 % (ref 39.0–52.0)
Hemoglobin: 14.4 g/dL (ref 13.0–17.0)
Immature Granulocytes: 0 %
Lymphocytes Relative: 23 %
Lymphs Abs: 1.1 10*3/uL (ref 0.7–4.0)
MCH: 31.7 pg (ref 26.0–34.0)
MCHC: 32.4 g/dL (ref 30.0–36.0)
MCV: 98 fL (ref 80.0–100.0)
Monocytes Absolute: 0.8 10*3/uL (ref 0.1–1.0)
Monocytes Relative: 16 %
Neutro Abs: 2.9 10*3/uL (ref 1.7–7.7)
Neutrophils Relative %: 58 %
Platelet Count: 153 10*3/uL (ref 150–400)
RBC: 4.54 MIL/uL (ref 4.22–5.81)
RDW: 13.2 % (ref 11.5–15.5)
WBC Count: 4.9 10*3/uL (ref 4.0–10.5)
nRBC: 0 % (ref 0.0–0.2)

## 2019-10-07 LAB — TOTAL PROTEIN, URINE DIPSTICK: Protein, ur: NEGATIVE mg/dL

## 2019-10-07 MED ORDER — SODIUM CHLORIDE 0.9 % IV SOLN
Freq: Once | INTRAVENOUS | Status: AC
  Filled 2019-10-07: qty 250

## 2019-10-07 MED ORDER — SODIUM CHLORIDE 0.9 % IV SOLN
1200.0000 mg | Freq: Once | INTRAVENOUS | Status: AC
  Administered 2019-10-07: 1200 mg via INTRAVENOUS
  Filled 2019-10-07: qty 20

## 2019-10-07 MED ORDER — SODIUM CHLORIDE 0.9 % IV SOLN
1200.0000 mg | Freq: Once | INTRAVENOUS | Status: AC
  Administered 2019-10-07: 1200 mg via INTRAVENOUS
  Filled 2019-10-07: qty 48

## 2019-10-07 NOTE — Progress Notes (Signed)
Oncology Nurse Navigator Documentation  Oncology Nurse Navigator Flowsheets 10/07/2019  Abnormal Finding Date -  Confirmed Diagnosis Date -  Diagnosis Status -  Planned Course of Treatment -  Phase of Treatment -  Chemotherapy Actual Start Date: -  Chemo/Radiation Concurrent Pending- Reason: -  Radiation Actual Start Date: -  Navigator Follow Up Date: 10/28/2019  Navigator Follow Up Reason: Follow-up Appointment;Chemotherapy  Navigator Location [facility]  Referral Date to RadOnc/MedOnc -  Navigator Encounter Type Treatment  Telephone -  Treatment Initiated Date -  Patient Visit Type MedOnc  Treatment Phase Active Tx  Barriers/Navigation Needs Anxiety;Food Disparities;Mobility Issues;Morbidities/Frailty;Multiple Hospital Admissions;Pain  Education -  Interventions Psycho-Social Support  Acuity Level 2-Minimal Needs (1-2 Barriers Identified)  Referrals -  Coordination of Care -  Education Method -  Support Groups/Services Friends and Family  Time Spent with Patient 15

## 2019-10-07 NOTE — Progress Notes (Signed)
Hematology and Oncology Follow Up Visit  Kerry Adams [id number] 1955/06/09 64 y.o. 10/07/2019   Principle Diagnosis:  Hepatocellular carcinoma -- mets to bone, lung, lymph nodes  Current Therapy:        Palliative Radiation to the RIGHT femur Tecentriq/Avastin -- started 08/05/2019, s/p cycle 1 Xgeva 120 mg sq q 3 months -- next dose in 09/2019   Interim History:  Kerry Adams is here today with his wife.  He is doing much better.  And is very happy that his quality of life is improving.  His last alpha-fetoprotein was down to 139.  As such, this really shows that treatment is working.  He is doing some physical therapy for his hip.  I told him that if he wanted to do a little more he certainly could.  He is not drinking.  He has done a very good job in stopping from alcohol abuse.  He has had no issues with bleeding.  He has had no problems with bowels or bladder.  He is on tramadol.  He takes this 2 or 3 times a day if needed.  He has had no headache.  He has had no mouth sores.  He does state that his voice is little bit hoarse after he has treatment.  I suppose this might be from the immunotherapy.    I currently would have to say his performance status is probably ECOG 1.  Medications:  Allergies as of 10/07/2019   No Known Allergies     Medication List       Accurate as of October 07, 2019  1:12 PM. If you have any questions, ask your nurse or doctor.        acetaminophen 500 MG tablet Commonly known as: TYLENOL Take 500 mg by mouth every 8 (eight) hours as needed (pain).   diazepam 2 MG tablet Commonly known as: VALIUM Take 1 tablet (2 mg total) by mouth every 12 (twelve) hours as needed for anxiety.   famotidine 10 MG tablet Commonly known as: PEPCID Take 10 mg by mouth every morning.   folic acid 1 MG tablet Commonly known as: FOLVITE Take 1 tablet (1 mg total) by mouth daily.   Imodium A-D 2 MG tablet Generic drug: loperamide Take 1 mg by  mouth at bedtime as needed for diarrhea or loose stools. Takes 1/2 tablet total of 1 mg at bedtime.   metoprolol tartrate 100 MG tablet Commonly known as: LOPRESSOR Take 1 tablet (100 mg total) by mouth 2 (two) times daily.   multivitamin with minerals Tabs tablet Take 1 tablet by mouth daily.   omeprazole 20 MG capsule Commonly known as: PriLOSEC Take 1 capsule (20 mg total) by mouth daily.   ondansetron 8 MG tablet Commonly known as: ZOFRAN Take by mouth 2 (two) times daily.   oxybutynin 5 MG tablet Commonly known as: DITROPAN Take 5 mg by mouth 2 (two) times daily.   oxycodone 5 MG capsule Commonly known as: OXY-IR Take 5 mg by mouth 4 (four) times daily as needed.   Potassium 99 MG Tabs Take 99 mg by mouth daily.   prochlorperazine 10 MG tablet Commonly known as: COMPAZINE Take 10 mg by mouth every 6 (six) hours as needed for nausea or vomiting.   rivaroxaban 20 MG Tabs tablet Commonly known as: Xarelto Take 1 tablet (20 mg total) by mouth daily with supper.   thiamine 100 MG tablet Take 1 tablet (100 mg total) by mouth daily.  traMADol 50 MG tablet Commonly known as: ULTRAM Take 1 tablet (50 mg total) by mouth every 6 (six) hours as needed.   trolamine salicylate 10 % cream Commonly known as: ASPERCREME Apply 1 application topically as needed for muscle pain.       Allergies: No Known Allergies  Past Medical History, Surgical history, Social history, and Family History were reviewed and updated.  Review of Systems: Review of Systems  Constitutional: Negative.   HENT: Negative.   Eyes: Negative.   Respiratory: Negative.   Cardiovascular: Negative.   Gastrointestinal: Negative.   Genitourinary: Negative.   Musculoskeletal: Positive for back pain, falls and joint pain.  Skin: Negative.   Neurological: Positive for focal weakness.  Endo/Heme/Allergies: Negative.   Psychiatric/Behavioral: Negative.      Physical Exam:  weight is 185 lb (83.9  kg). His oral temperature is 97.4 F (36.3 C) (abnormal). His blood pressure is 122/87 and his pulse is 92. His respiration is 20 and oxygen saturation is 100%.   Wt Readings from Last 3 Encounters:  10/07/19 185 lb (83.9 kg)  09/16/19 190 lb 13.6 oz (86.6 kg)  09/01/19 198 lb 13.7 oz (90.2 kg)    Physical Exam Vitals reviewed.  HENT:     Head: Normocephalic and atraumatic.  Eyes:     Pupils: Pupils are equal, round, and reactive to light.  Cardiovascular:     Rate and Rhythm: Normal rate and regular rhythm.     Heart sounds: Normal heart sounds.  Pulmonary:     Effort: Pulmonary effort is normal.     Breath sounds: Normal breath sounds.  Abdominal:     General: Bowel sounds are normal.     Palpations: Abdomen is soft.  Musculoskeletal:        General: No tenderness or deformity. Normal range of motion.     Cervical back: Normal range of motion.  Lymphadenopathy:     Cervical: No cervical adenopathy.  Skin:    General: Skin is warm and dry.     Findings: No erythema or rash.  Neurological:     Mental Status: He is alert and oriented to person, place, and time.  Psychiatric:        Behavior: Behavior normal.        Thought Content: Thought content normal.        Judgment: Judgment normal.      Lab Results  Component Value Date   WBC 4.9 10/07/2019   HGB 14.4 10/07/2019   HCT 44.5 10/07/2019   MCV 98.0 10/07/2019   PLT 153 10/07/2019   Lab Results  Component Value Date   FERRITIN 738 (H) 08/18/2019   IRON 63 08/18/2019   TIBC 341 08/18/2019   UIBC 278 08/18/2019   IRONPCTSAT 18 (L) 08/18/2019   Lab Results  Component Value Date   RBC 4.54 10/07/2019   Lab Results  Component Value Date   KPAFRELGTCHN 23.3 (H) 06/20/2019   LAMBDASER 19.9 06/20/2019   KAPLAMBRATIO 1.17 06/20/2019   Lab Results  Component Value Date   IGGSERUM 1,078 06/20/2019   IGA 457 (H) 06/20/2019   IGMSERUM 80 06/20/2019   Lab Results  Component Value Date   TOTALPROTELP 6.2  06/20/2019   ALBUMINELP 3.2 06/20/2019   A1GS 0.3 06/20/2019   A2GS 0.6 06/20/2019   BETS 1.2 06/20/2019   GAMS 0.9 06/20/2019   MSPIKE Not Observed 06/20/2019     Chemistry      Component Value Date/Time   NA 134 (  L) 10/07/2019 1115   NA 137 05/13/2019 1110   K 4.6 10/07/2019 1115   CL 101 10/07/2019 1115   CO2 27 10/07/2019 1115   BUN 8 10/07/2019 1115   BUN 7 (L) 05/13/2019 1110   CREATININE 0.63 10/07/2019 1115      Component Value Date/Time   CALCIUM 9.2 10/07/2019 1115   ALKPHOS 190 (H) 10/07/2019 1115   AST 49 (H) 10/07/2019 1115   ALT 25 10/07/2019 1115   BILITOT 1.9 (H) 10/07/2019 1115       Impression and Plan: Kerry Adams is a very pleasant 64 yo caucasian gentleman with metastatic hepatocellular carcinoma.   Again, I am just happy that he is doing better.  He certainly does look a little bit better.  The fact that his albumin is getting better is a very good sign.  I think with his albumin getting better, he will not have as much swelling in his legs.  The fact that his albumin is improving also indicates that his liver is improving.  We will plan to get him back to see [place] in another 3 weeks.  I know this works well for him.  Again, this is all about quality of life.  This will be his third cycle of treatment.  After his fourth cycle, we will then rescan him.   Volanda Napoleon, MD 8/13/20211:12 PM

## 2019-10-07 NOTE — Progress Notes (Signed)
Patient with weight loss. Dose of bevacizumab decreased to reflect patient's current weight per Dr. Antonieta Pert instructions.

## 2019-10-07 NOTE — Patient Instructions (Signed)
Bevacizumab injection What is this medicine? BEVACIZUMAB (be va SIZ yoo mab) is a monoclonal antibody. It is used to treat many types of cancer. This medicine may be used for other purposes; ask your health care provider or pharmacist if you have questions. COMMON BRAND NAME(S): Avastin, MVASI, Zirabev What should I tell my health care provider before I take this medicine? They need to know if you have any of these conditions:  diabetes  heart disease  high blood pressure  history of coughing up blood  prior anthracycline chemotherapy (e.g., doxorubicin, daunorubicin, epirubicin)  recent or ongoing radiation therapy  recent or planning to have surgery  stroke  an unusual or allergic reaction to bevacizumab, hamster proteins, mouse proteins, other medicines, foods, dyes, or preservatives  pregnant or trying to get pregnant  breast-feeding How should I use this medicine? This medicine is for infusion into a vein. It is given by a health care professional in a hospital or clinic setting. Talk to your pediatrician regarding the use of this medicine in children. Special care may be needed. Overdosage: If you think you have taken too much of this medicine contact a poison control center or emergency room at once. NOTE: This medicine is only for you. Do not share this medicine with others. What if I miss a dose? It is important not to miss your dose. Call your doctor or health care professional if you are unable to keep an appointment. What may interact with this medicine? Interactions are not expected. This list may not describe all possible interactions. Give your health care provider a list of all the medicines, herbs, non-prescription drugs, or dietary supplements you use. Also tell them if you smoke, drink alcohol, or use illegal drugs. Some items may interact with your medicine. What should I watch for while using this medicine? Your condition will be monitored carefully while  you are receiving this medicine. You will need important blood work and urine testing done while you are taking this medicine. This medicine may increase your risk to bruise or bleed. Call your doctor or health care professional if you notice any unusual bleeding. Before having surgery, talk to your health care provider to make sure it is ok. This drug can increase the risk of poor healing of your surgical site or wound. You will need to stop this drug for 28 days before surgery. After surgery, wait at least 28 days before restarting this drug. Make sure the surgical site or wound is healed enough before restarting this drug. Talk to your health care provider if questions. Do not become pregnant while taking this medicine or for 6 months after stopping it. Women should inform their doctor if they wish to become pregnant or think they might be pregnant. There is a potential for serious side effects to an unborn child. Talk to your health care professional or pharmacist for more information. Do not breast-feed an infant while taking this medicine and for 6 months after the last dose. This medicine has caused ovarian failure in some women. This medicine may interfere with the ability to have a child. You should talk to your doctor or health care professional if you are concerned about your fertility. What side effects may I notice from receiving this medicine? Side effects that you should report to your doctor or health care professional as soon as possible:  allergic reactions like skin rash, itching or hives, swelling of the face, lips, or tongue  chest pain or chest tightness    chills  coughing up blood  high fever  seizures  severe constipation  signs and symptoms of bleeding such as bloody or black, tarry stools; red or dark-brown urine; spitting up blood or brown material that looks like coffee grounds; red spots on the skin; unusual bruising or bleeding from the eye, gums, or nose  signs  and symptoms of a blood clot such as breathing problems; chest pain; severe, sudden headache; pain, swelling, warmth in the leg  signs and symptoms of a stroke like changes in vision; confusion; trouble speaking or understanding; severe headaches; sudden numbness or weakness of the face, arm or leg; trouble walking; dizziness; loss of balance or coordination  stomach pain  sweating  swelling of legs or ankles  vomiting  weight gain Side effects that usually do not require medical attention (report to your doctor or health care professional if they continue or are bothersome):  back pain  changes in taste  decreased appetite  dry skin  nausea  tiredness This list may not describe all possible side effects. Call your doctor for medical advice about side effects. You may report side effects to FDA at 1-800-FDA-1088. Where should I keep my medicine? This drug is given in a hospital or clinic and will not be stored at home. NOTE: This sheet is a summary. It may not cover all possible information. If you have questions about this medicine, talk to your doctor, pharmacist, or health care provider.  2020 Elsevier/Gold Standard (2018-12-08 10:50:46)   Atezolizumab injection What is this medicine? ATEZOLIZUMAB (a te zoe LIZ ue mab) is a monoclonal antibody. It is used to treat bladder cancer (urothelial cancer), liver cancer, lung cancer, breast cancer, and melanoma. This medicine may be used for other purposes; ask your health care provider or pharmacist if you have questions. COMMON BRAND NAME(S): Tecentriq What should I tell my health care provider before I take this medicine? They need to know if you have any of these conditions:  diabetes  immune system problems  infection  inflammatory bowel disease  liver disease  lung or breathing disease  lupus  nervous system problems like myasthenia gravis or Guillain-Barre syndrome  organ transplant  an unusual or  allergic reaction to atezolizumab, other medicines, foods, dyes, or preservatives  pregnant or trying to get pregnant  breast-feeding How should I use this medicine? This medicine is for infusion into a vein. It is given by a health care professional in a hospital or clinic setting. A special MedGuide will be given to you before each treatment. Be sure to read this information carefully each time. Talk to your pediatrician regarding the use of this medicine in children. Special care may be needed. Overdosage: If you think you have taken too much of this medicine contact a poison control center or emergency room at once. NOTE: This medicine is only for you. Do not share this medicine with others. What if I miss a dose? It is important not to miss your dose. Call your doctor or health care professional if you are unable to keep an appointment. What may interact with this medicine? Interactions have not been studied. This list may not describe all possible interactions. Give your health care provider a list of all the medicines, herbs, non-prescription drugs, or dietary supplements you use. Also tell them if you smoke, drink alcohol, or use illegal drugs. Some items may interact with your medicine. What should I watch for while using this medicine? Your condition will be monitored carefully   while you are receiving this medicine. You may need blood work done while you are taking this medicine. Do not become pregnant while taking this medicine or for at least 5 months after stopping it. Women should inform their doctor if they wish to become pregnant or think they might be pregnant. There is a potential for serious side effects to an unborn child. Talk to your health care professional or pharmacist for more information. Do not breast-feed an infant while taking this medicine or for at least 5 months after the last dose. What side effects may I notice from receiving this medicine? Side effects that  you should report to your doctor or health care professional as soon as possible:  allergic reactions like skin rash, itching or hives, swelling of the face, lips, or tongue  black, tarry stools  bloody or watery diarrhea  breathing problems  changes in vision  chest pain or chest tightness  chills  facial flushing  fever  headache  signs and symptoms of high blood sugar such as dizziness; dry mouth; dry skin; fruity breath; nausea; stomach pain; increased hunger or thirst; increased urination  signs and symptoms of liver injury like dark yellow or brown urine; general ill feeling or flu-like symptoms; light-colored stools; loss of appetite; nausea; right upper belly pain; unusually weak or tired; yellowing of the eyes or skin  stomach pain  trouble passing urine or change in the amount of urine Side effects that usually do not require medical attention (report to your doctor or health care professional if they continue or are bothersome):  bone pain  cough  diarrhea  joint pain  muscle pain  muscle weakness  swelling of arms or legs  tiredness  weight loss This list may not describe all possible side effects. Call your doctor for medical advice about side effects. You may report side effects to FDA at 1-800-FDA-1088. Where should I keep my medicine? This drug is given in a hospital or clinic and will not be stored at home. NOTE: This sheet is a summary. It may not cover all possible information. If you have questions about this medicine, talk to your doctor, pharmacist, or health care provider.  2020 Elsevier/Gold Standard (2018-10-01 13:11:14)  

## 2019-10-08 LAB — T4: T4, Total: 5.5 ug/dL (ref 4.5–12.0)

## 2019-10-08 LAB — AFP TUMOR MARKER: AFP, Serum, Tumor Marker: 235 ng/mL — ABNORMAL HIGH (ref 0.0–8.3)

## 2019-10-10 LAB — TSH: TSH: 1.39 u[IU]/mL (ref 0.320–4.118)

## 2019-10-10 LAB — LACTATE DEHYDROGENASE: LDH: 177 U/L (ref 98–192)

## 2019-10-11 NOTE — Progress Notes (Signed)
Cardiology Office Note Date:  10/12/2019  Patient ID:  Kerry, Adams 05/19/1955, MRN [id number] PCP:  Vicenta Aly, [place]  Cardiologist:  Dr. Harrington Challenger EP: Dr. Curt Bears    Chief Complaint: hospital follow up  History of Present Illness: Kerry Adams is a 64 y.o. male with history of HTN, HLD, OA, cirrhosis, ETOH abuse, CAD (CTscan wit hscore of 784; LM 26; LAD 553; LCx 295)   Myovue after was normal), persistent AFib   He comes in today to be seen for Dr. Curt Bears, last seen by him 05/31/19, home HR generally reported 100's-120s Patient was undecided if he wanted to pursue rhythm vs rate control.  His metoprolol was increased, planned for 75mo follow up.  Urged to stop/reduce ETOH  Since then he has been found with  Hepatocellular carcinoma w/mets to bone, lung, lymph Received palliative XRT in May that is completed to R femur  chemo started June 2021 Tecentriq/Avastin -- started 08/05/2019 Xgeva 120 mg sq q 3 months  Hospitalized with progressive weakness, recurrent falls and hip pain.  He was in AFib w/RVR, treated with dilt gtt, he had increasing agitation, hallucinations that ultimately required precedex weaned to valium, for ETOH withdrawal. Discharged 09/02/2019  Saw Heme-onc 10/07/19, was doing well, tolerating treatment.  Quit ETOH  TODAY He is accompanied by his wife He is tolerating his chemo so far. He denies any CP, palpitations, SOB, no near syncope or syncope He c/w R hip pain, walking with a walker His wife wanted to check in post hospital stay to re-visit his AFib and was told his heart muscle function was reduced in the hospital He did have swelling of his LE going into the hospital but has remained resolved since home He has reduced appetite, has lost weight.  His wife mentions he lost most of the weight in the hospital with the diuretics, but not eating as well as usual since home either.  Mentions this time of year he usually loses some weight eating fresh  vegetables from the farm rather then snacking/processed foods.  No bleeding or signs of bleeding  They inquire about considering rhythm control, previously discussed with Dr. Curt Bears admitting him to start a medicine  They report home health RN daily and his HR ar 80-100, and make note that is never >100 and his BP running 110s like today  Past Medical History:  Diagnosis Date  . Abnormal EKG   . Alcohol use 02/18/2014  . Arthritis    osteo knees and hips   . Atrial fibrillation ([place])   . CAD (coronary artery disease)    WITHOUT ANGINA PECTORIS, UNSPECIFIED VESSEL OR LESION TYPE, UNSPECIFIED WHETHER NATIVE OR TRANSPLANTED HEART  . 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 Chronic liver disease   . Cirrhosis ([place])   . Closed right hip fracture ([place]) 02/18/2014  . Dizziness   . Dysrhythmia   . Elevated liver function tests   . GERD (gastroesophageal reflux disease)    treats with OTC Ranitidine  . Goals of care, counseling/discussion 06/20/2019  . Hepatocellular carcinoma metastatic to lung ([place]) 07/08/2019  . Hepatocellular carcinoma metastatic to lymph nodes of multiple sites ([place]) 07/08/2019  . Hip fracture requiring operative repair ([place]) 02/19/2014  . Hyperlipidemia   . Hypertension   . Metastasis to right adrenal gland of unknown origin ([place]) 06/20/2019  . Metastatic sarcoma to liver ([place]) 06/20/2019  . 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 Osteoarthritis of knee, unilateral 03/01/2015  .  Primary osteoarthritis of right knee 05/23/2015  . Thrombocytopenia ([place])     Past Surgical History:  Procedure Laterality Date  . CARDIOVERSION N/A 09/13/2018   Procedure: CARDIOVERSION;  Surgeon: Acie Fredrickson Wonda Cheng, MD;  Location: [facility] ENDOSCOPY;  Service: Cardiovascular;  Laterality: N/A;  . FRACTURE SURGERY     right femur  . HERNIA REPAIR     umbilical  . INTERTROCHANTERIC HIP FRACTURE SURGERY Right 02/19/2014  . INTRAMEDULLARY (IM) NAIL INTERTROCHANTERIC Right  02/19/2014   Procedure: INTRAMEDULLARY (IM) NAIL INTERTROCHANTRIC;  Surgeon: [name], MD;  Location: [facility];  Service: Orthopedics;  Laterality: Right;  . SHOULDER SURGERY Right   . TOTAL KNEE ARTHROPLASTY Left 03/01/2015   Procedure: TOTAL KNEE ARTHROPLASTY;  Surgeon: [name], MD;  Location: [facility] [facility];  Service: Orthopedics;  Laterality: Left;  Left knee arthroplasty  . TOTAL KNEE ARTHROPLASTY Right 05/23/2015   Procedure: TOTAL KNEE ARTHROPLASTY W/HARDWARE REMOVAL FEMUR;  Surgeon: Frederik Pear, MD;  Location: [facility];  Service: Orthopedics;  Laterality: Right;    Current Outpatient Medications  Medication Sig Dispense Refill  . acetaminophen (TYLENOL) 500 MG tablet Take 500 mg by mouth every 8 (eight) hours as needed (pain).     . diazepam (VALIUM) 2 MG tablet Take 1 tablet (2 mg total) by mouth every 12 (twelve) hours as needed for anxiety. 14 tablet 0  . famotidine (PEPCID) 10 MG tablet Take 10 mg by mouth every morning.     . metoprolol tartrate (LOPRESSOR) 100 MG tablet Take 1 tablet (100 mg total) by mouth 2 (two) times daily. 60 tablet 1  . Multiple Vitamin (MULTIVITAMIN WITH MINERALS) TABS tablet Take 1 tablet by mouth daily.    . Multiple Vitamin (MULTIVITAMIN) capsule Take 1 capsule by mouth daily.    . ondansetron (ZOFRAN) 8 MG tablet Take by mouth every 8 (eight) hours as needed.     Marland Kitchen oxybutynin (DITROPAN) 5 MG tablet Take 5 mg by mouth 2 (two) times daily.    Marland Kitchen oxycodone (OXY-IR) 5 MG capsule Take 5 mg by mouth 4 (four) times daily as needed.     . polyethylene glycol powder (GLYCOLAX/MIRALAX) 17 GM/SCOOP powder Take 1 Container by mouth daily as needed.    . Potassium 99 MG TABS Take 99 mg by mouth daily.    . prochlorperazine (COMPAZINE) 10 MG tablet Take 10 mg by mouth every 6 (six) hours as needed for nausea or vomiting.    . rivaroxaban (XARELTO) 20 MG TABS tablet Take 1 tablet (20 mg total) by mouth daily with supper. 30 tablet 5  . thiamine 100 MG tablet  Take 1 tablet (100 mg total) by mouth daily. 30 tablet 1  . traMADol (ULTRAM) 50 MG tablet Take 1 tablet (50 mg total) by mouth every 6 (six) hours as needed. (Patient taking differently: Take 50 mg by mouth 2 (two) times daily. ) 60 tablet 0  . trolamine salicylate (ASPERCREME) 10 % cream Apply 1 application topically as needed for muscle pain.     No current facility-administered medications for this visit.    Allergies:   Patient has no known allergies.   Social History:  The patient  reports that he has never smoked. He has never used smokeless tobacco. He reports current alcohol use of about 32.0 standard drinks of alcohol per week. He reports that he does not use drugs.   Family History:  The patient's family history includes Healthy in his brother; Hypertension in his mother; Stroke in his father.  ROS:  Please see the history of present illness. All other systems are reviewed and otherwise negative.   PHYSICAL EXAM:  VS:  BP 110/62   Pulse 60   Ht 6\' 1"  (1.854 m)   Wt 184 lb (83.5 kg)   SpO2 97%   BMI 24.28 kg/m  BMI: Body mass index is 24.28 kg/m. Well nourished, well developed, in no acute distress  HEENT: normocephalic, atraumatic  Neck: no JVD, carotid bruits or masses Cardiac:  irreg-irreg; no significant murmurs, no rubs, or gallops Lungs:  CTA b/l, no wheezing, rhonchi or rales  Abd: soft, nontender MS: no deformity, advanced atrophy for age Ext: no edema,chronic looking skin changes Skin: warm and dry, no rash Neuro:  No gross deficits appreciated Psych: euthymic mood, full affect    EKG:  Not done today Rhythm strip done today by myself to check rhythm  is Afib 92bpm  08/26/2019: TTE IMPRESSIONS  1. Left ventricular ejection fraction, by estimation, is 45 to 50%. The  left ventricle has mildly decreased function. The left ventricle  demonstrates global hypokinesis. There is mild left ventricular  hypertrophy. Left ventricular diastolic parameters  are  indeterminate.  2. Right ventricular systolic function is low normal. The right  ventricular size is mildly enlarged. There is mildly elevated pulmonary  artery systolic pressure. The estimated right ventricular systolic  pressure is 63.1 mmHg.  3. Left atrial size was severely dilated.  4. Right atrial size was severely dilated.  5. The mitral valve is grossly normal, mildly thickened. Mild mitral  valve regurgitation.  6. The aortic valve is tricuspid. Aortic valve regurgitation is not  visualized. Mild aortic valve stenosis. Aortic valve area, by VTI measures  1.62 cm. Aortic valve mean gradient measures 9.0 mmHg.  7. The inferior vena cava is dilated in size with >50% respiratory  variability, suggesting right atrial pressure of 8 mmHg.    07/20/2018: LVEF 60-65%   Recent Labs: 08/25/2019: B Natriuretic Peptide 633.0 08/30/2019: Magnesium 1.8 10/07/2019: ALT 25; BUN 8; Creatinine 0.63; Hemoglobin 14.4; Platelet Count 153; Potassium 4.6; Sodium 134; TSH 1.390  No results found for requested labs within last 8760 hours.   Estimated Creatinine Clearance: 104 mL/min (by C-G formula based on SCr of 0.63 mg/dL).   Wt Readings from Last 3 Encounters:  10/12/19 184 lb (83.5 kg)  10/07/19 185 lb (83.9 kg)  09/16/19 190 lb 13.6 oz (86.6 kg)     Other studies reviewed: Additional studies/records reviewed today include: summarized above  ASSESSMENT AND PLAN:  1. Longstanding persistent Afib     CHA2DS2Vasc is 2, on Xarelto, appropriately dosed  Recent exacerbation of RVR with ETOH withdrawal, pain issues, falls Rate controlled currently  He has been diagnosed with metastatic liver cancer. Finished XRT, tolerating chemo so far. Just out of the hospital with ETOH withdrawal (no ETOH since! He was congratulated on this) I suggested no DCCV or AAD right now, repeat his echo in a couple months, see how he is doing with his cancer treatments and at his visit with Dr. Curt Bears,  revisit rhythm control options.  2. Mild reduction in LVEF      Likely 2/2 to RVR at the time     For now, no changes     Plan to repeat echo prior to his visit with Dr. Curt Bears in october  3. HTN     Looks OK    Disposition: F/u as above  Current medicines are reviewed at length with the patient today.  The patient did not have any concerns regarding medicines.  Kerry Night, PA-C 10/12/2019 10:36 AM     [facility] [place] [address] [phone] (office)  [phone] (fax)

## 2019-10-12 ENCOUNTER — Other Ambulatory Visit: Payer: Self-pay

## 2019-10-12 ENCOUNTER — Ambulatory Visit (INDEPENDENT_AMBULATORY_CARE_PROVIDER_SITE_OTHER): Payer: Medicare Other | Admitting: Physician Assistant

## 2019-10-12 VITALS — BP 110/62 | HR 60 | Ht 73.0 in | Wt 184.0 lb

## 2019-10-12 DIAGNOSIS — I429 Cardiomyopathy, unspecified: Secondary | ICD-10-CM

## 2019-10-12 DIAGNOSIS — I10 Essential (primary) hypertension: Secondary | ICD-10-CM | POA: Diagnosis not present

## 2019-10-12 DIAGNOSIS — I4819 Other persistent atrial fibrillation: Secondary | ICD-10-CM

## 2019-10-12 DIAGNOSIS — I2510 Atherosclerotic heart disease of native coronary artery without angina pectoris: Secondary | ICD-10-CM

## 2019-10-12 NOTE — Patient Instructions (Signed)
Medication Instructions:   Your physician recommends that you continue on your current medications as directed. Please refer to the Current Medication list given to you today.  *If you need a refill on your cardiac medications before your next appointment, please call your pharmacy*   Lab Work: [place]   If you have labs (blood work) drawn today and your tests are completely normal, you will receive your results only by:  [place] (if you have MyChart) OR  A paper copy in the mail If you have any lab test that is abnormal or we need to change your treatment, we will call you to review the results.   Testing/Procedures: before follow up appointment  Your physician has requested that you have an echocardiogram. Echocardiography is a painless test that uses sound waves to create images of your heart. It provides your doctor with information about the size and shape of your heart and how well your hearts chambers and valves are working. This procedure takes approximately one hour. There are no restrictions for this procedure.     Follow-Up: At [facility], you and your health needs are our priority.  As part of our continuing mission to provide you with exceptional heart care, we have created designated Provider Care Teams.  These Care Teams include your primary Cardiologist (physician) and Advanced Practice Providers (APPs -  Physician Assistants and Nurse Practitioners) who all work together to provide you with the care you need, when you need it.  We recommend signing up for the patient portal called "MyChart".  Sign up information is provided on this After Visit Summary.  MyChart is used to connect with patients for Virtual Visits (Telemedicine).  Patients are able to view lab/test results, encounter notes, upcoming appointments, etc.  Non-urgent messages can be sent to your provider as well.   To learn more about what you can do with MyChart, go to  [email or web address].    Your next appointment:  AS SCHEDULED    Other Instructions

## 2019-10-21 ENCOUNTER — Other Ambulatory Visit: Payer: Self-pay | Admitting: Hematology & Oncology

## 2019-10-28 ENCOUNTER — Other Ambulatory Visit: Payer: Self-pay

## 2019-10-28 ENCOUNTER — Inpatient Hospital Stay: Payer: Medicare Other | Attending: Hematology & Oncology

## 2019-10-28 ENCOUNTER — Inpatient Hospital Stay (HOSPITAL_BASED_OUTPATIENT_CLINIC_OR_DEPARTMENT_OTHER): Payer: Medicare Other | Admitting: Hematology & Oncology

## 2019-10-28 ENCOUNTER — Encounter: Payer: Self-pay | Admitting: *Deleted

## 2019-10-28 ENCOUNTER — Encounter: Payer: Self-pay | Admitting: Hematology & Oncology

## 2019-10-28 ENCOUNTER — Inpatient Hospital Stay: Payer: Medicare Other

## 2019-10-28 VITALS — BP 127/88 | HR 98 | Temp 98.0°F | Resp 20 | Wt 186.0 lb

## 2019-10-28 DIAGNOSIS — C7801 Secondary malignant neoplasm of right lung: Secondary | ICD-10-CM

## 2019-10-28 DIAGNOSIS — Z79899 Other long term (current) drug therapy: Secondary | ICD-10-CM | POA: Diagnosis not present

## 2019-10-28 DIAGNOSIS — Z923 Personal history of irradiation: Secondary | ICD-10-CM | POA: Insufficient documentation

## 2019-10-28 DIAGNOSIS — R222 Localized swelling, mass and lump, trunk: Secondary | ICD-10-CM | POA: Diagnosis not present

## 2019-10-28 DIAGNOSIS — C220 Liver cell carcinoma: Secondary | ICD-10-CM

## 2019-10-28 DIAGNOSIS — Z5112 Encounter for antineoplastic immunotherapy: Secondary | ICD-10-CM | POA: Insufficient documentation

## 2019-10-28 DIAGNOSIS — C7800 Secondary malignant neoplasm of unspecified lung: Secondary | ICD-10-CM

## 2019-10-28 DIAGNOSIS — M549 Dorsalgia, unspecified: Secondary | ICD-10-CM | POA: Insufficient documentation

## 2019-10-28 DIAGNOSIS — I2510 Atherosclerotic heart disease of native coronary artery without angina pectoris: Secondary | ICD-10-CM

## 2019-10-28 DIAGNOSIS — C7951 Secondary malignant neoplasm of bone: Secondary | ICD-10-CM | POA: Insufficient documentation

## 2019-10-28 LAB — CBC WITH DIFFERENTIAL (CANCER CENTER ONLY)
Abs Immature Granulocytes: 0.01 10*3/uL (ref 0.00–0.07)
Basophils Absolute: 0.1 10*3/uL (ref 0.0–0.1)
Basophils Relative: 1 %
Eosinophils Absolute: 0.1 10*3/uL (ref 0.0–0.5)
Eosinophils Relative: 2 %
HCT: 42.5 % (ref 39.0–52.0)
Hemoglobin: 13.8 g/dL (ref 13.0–17.0)
Immature Granulocytes: 0 %
Lymphocytes Relative: 22 %
Lymphs Abs: 1.2 10*3/uL (ref 0.7–4.0)
MCH: 31.1 pg (ref 26.0–34.0)
MCHC: 32.5 g/dL (ref 30.0–36.0)
MCV: 95.7 fL (ref 80.0–100.0)
Monocytes Absolute: 0.7 10*3/uL (ref 0.1–1.0)
Monocytes Relative: 14 %
Neutro Abs: 3.4 10*3/uL (ref 1.7–7.7)
Neutrophils Relative %: 61 %
Platelet Count: 150 10*3/uL (ref 150–400)
RBC: 4.44 MIL/uL (ref 4.22–5.81)
RDW: 13.9 % (ref 11.5–15.5)
WBC Count: 5.5 10*3/uL (ref 4.0–10.5)
nRBC: 0 % (ref 0.0–0.2)

## 2019-10-28 LAB — CMP (CANCER CENTER ONLY)
ALT: 33 U/L (ref 0–44)
AST: 66 U/L — ABNORMAL HIGH (ref 15–41)
Albumin: 2.9 g/dL — ABNORMAL LOW (ref 3.5–5.0)
Alkaline Phosphatase: 197 U/L — ABNORMAL HIGH (ref 38–126)
Anion gap: 6 (ref 5–15)
BUN: 9 mg/dL (ref 8–23)
CO2: 27 mmol/L (ref 22–32)
Calcium: 8.8 mg/dL — ABNORMAL LOW (ref 8.9–10.3)
Chloride: 104 mmol/L (ref 98–111)
Creatinine: 0.57 mg/dL — ABNORMAL LOW (ref 0.61–1.24)
GFR, Est AFR Am: >60 mL/min (ref >60)
GFR, Estimated: >60 mL/min (ref >60)
Glucose, Bld: 115 mg/dL — ABNORMAL HIGH (ref 70–99)
Potassium: 4 mmol/L (ref 3.5–5.1)
Sodium: 137 mmol/L (ref 135–145)
Total Bilirubin: 1.4 mg/dL — ABNORMAL HIGH (ref 0.3–1.2)
Total Protein: 6.3 g/dL — ABNORMAL LOW (ref 6.5–8.1)

## 2019-10-28 LAB — TOTAL PROTEIN, URINE DIPSTICK: Protein, ur: NEGATIVE mg/dL

## 2019-10-28 MED ORDER — SODIUM CHLORIDE 0.9 % IV SOLN
Freq: Once | INTRAVENOUS | Status: AC
  Filled 2019-10-28: qty 250

## 2019-10-28 MED ORDER — DENOSUMAB 120 MG/1.7ML ~~LOC~~ SOLN
SUBCUTANEOUS | Status: AC
  Filled 2019-10-28: qty 1.7

## 2019-10-28 MED ORDER — DENOSUMAB 120 MG/1.7ML ~~LOC~~ SOLN
120.0000 mg | Freq: Once | SUBCUTANEOUS | Status: AC
  Administered 2019-10-28: 120 mg via SUBCUTANEOUS

## 2019-10-28 MED ORDER — SODIUM CHLORIDE 0.9 % IV SOLN
1200.0000 mg | Freq: Once | INTRAVENOUS | Status: AC
  Administered 2019-10-28: 1200 mg via INTRAVENOUS
  Filled 2019-10-28: qty 48

## 2019-10-28 MED ORDER — SODIUM CHLORIDE 0.9 % IV SOLN
1200.0000 mg | Freq: Once | INTRAVENOUS | Status: AC
  Administered 2019-10-28: 1200 mg via INTRAVENOUS
  Filled 2019-10-28: qty 20

## 2019-10-28 NOTE — Progress Notes (Signed)
Hematology and Oncology Follow Up Visit  Kerry Adams [id number] [birth year] 64 y.o. 10/28/2019   Principle Diagnosis:  Hepatocellular carcinoma -- mets to bone, lung, lymph nodes  Current Therapy:        Palliative Radiation to the RIGHT femur Tecentriq/Avastin -- started 08/05/2019, s/p cycle #3 Xgeva 120 mg sq q 3 months -- next dose in 09/2019   Interim History:  Kerry Adams is here today with his wife.  He seems to be doing pretty well.  He is doing physical therapy at home.  He was on his tractor this past week.  This is a huge emotional boost for him.  His last alpha-fetoprotein was actually up a little bit.  I think it was up to 235.  He will be due for a CT scan after this cycle of treatment.  His appetite is doing quite well.  He has had no problems with nausea or vomiting.  He has had no change in bowel or bladder habits.  He has had no obvious increase in leg swelling.  He is still abstaining from alcohol which is a huge plus for him.  He has had no headache.  He has had no bleeding.  He has had some bruising but this is more chronic than anything else.  Overall, his performance status is ECOG 1.     Medications:  Allergies as of 10/28/2019   No Known Allergies     Medication List       Accurate as of October 28, 2019 10:57 AM. If you have any questions, ask your nurse or doctor.        STOP taking these medications   oxybutynin 5 MG tablet Commonly known as: DITROPAN Stopped by: Volanda Napoleon, MD     TAKE these medications   acetaminophen 500 MG tablet Commonly known as: TYLENOL Take 500 mg by mouth every 8 (eight) hours as needed (pain).   diazepam 2 MG tablet Commonly known as: VALIUM Take 1 tablet (2 mg total) by mouth every 12 (twelve) hours as needed for anxiety.   famotidine 10 MG tablet Commonly known as: PEPCID Take 10 mg by mouth every morning.   metoprolol tartrate 100 MG tablet Commonly known as: LOPRESSOR Take 1 tablet (100 mg  total) by mouth 2 (two) times daily.   multivitamin with minerals Tabs tablet Take 1 tablet by mouth daily.   ondansetron 8 MG tablet Commonly known as: ZOFRAN Take by mouth every 8 (eight) hours as needed.   oxycodone 5 MG capsule Commonly known as: OXY-IR Take 5 mg by mouth 4 (four) times daily as needed.   polyethylene glycol powder 17 GM/SCOOP powder Commonly known as: GLYCOLAX/MIRALAX Take 1 Container by mouth daily as needed.   Potassium 99 MG Tabs Take 99 mg by mouth daily.   prochlorperazine 10 MG tablet Commonly known as: COMPAZINE Take 10 mg by mouth every 6 (six) hours as needed for nausea or vomiting.   rivaroxaban 20 MG Tabs tablet Commonly known as: Xarelto Take 1 tablet (20 mg total) by mouth daily with supper.   thiamine 100 MG tablet Take 1 tablet (100 mg total) by mouth daily.   traMADol 50 MG tablet Commonly known as: ULTRAM TAKE 1 TABLET BY MOUTH EVERY 6 HOURS AS NEEDED   trolamine salicylate 10 % cream Commonly known as: ASPERCREME Apply 1 application topically as needed for muscle pain.       Allergies: No Known Allergies  Past Medical History, Surgical history,  Social history, and Family History were reviewed and updated.  Review of Systems: Review of Systems  Constitutional: Negative.   HENT: Negative.   Eyes: Negative.   Respiratory: Negative.   Cardiovascular: Negative.   Gastrointestinal: Negative.   Genitourinary: Negative.   Musculoskeletal: Positive for back pain, falls and joint pain.  Skin: Negative.   Neurological: Positive for focal weakness.  Endo/Heme/Allergies: Negative.   Psychiatric/Behavioral: Negative.      Physical Exam:  weight is 186 lb (84.4 kg). His oral temperature is 98 F (36.7 C). His blood pressure is 127/88 and his pulse is 98. His respiration is 20 and oxygen saturation is 100%.   Wt Readings from Last 3 Encounters:  10/28/19 186 lb (84.4 kg)  10/12/19 184 lb (83.5 kg)  10/07/19 185 lb (83.9 kg)     Physical Exam Vitals reviewed.  HENT:     Head: Normocephalic and atraumatic.  Eyes:     Pupils: Pupils are equal, round, and reactive to light.  Cardiovascular:     Rate and Rhythm: Normal rate and regular rhythm.     Heart sounds: Normal heart sounds.  Pulmonary:     Effort: Pulmonary effort is normal.     Breath sounds: Normal breath sounds.  Abdominal:     General: Bowel sounds are normal.     Palpations: Abdomen is soft.  Musculoskeletal:        General: No tenderness or deformity. Normal range of motion.     Cervical back: Normal range of motion.  Lymphadenopathy:     Cervical: No cervical adenopathy.  Skin:    General: Skin is warm and dry.     Findings: No erythema or rash.  Neurological:     Mental Status: He is alert and oriented to person, place, and time.  Psychiatric:        Behavior: Behavior normal.        Thought Content: Thought content normal.        Judgment: Judgment normal.      Lab Results  Component Value Date   WBC 5.5 10/28/2019   HGB 13.8 10/28/2019   HCT 42.5 10/28/2019   MCV 95.7 10/28/2019   PLT 150 10/28/2019   Lab Results  Component Value Date   FERRITIN 738 (H) 08/18/2019   IRON 63 08/18/2019   TIBC 341 08/18/2019   UIBC 278 08/18/2019   IRONPCTSAT 18 (L) 08/18/2019   Lab Results  Component Value Date   RBC 4.44 10/28/2019   Lab Results  Component Value Date   KPAFRELGTCHN 23.3 (H) 06/20/2019   LAMBDASER 19.9 06/20/2019   KAPLAMBRATIO 1.17 06/20/2019   Lab Results  Component Value Date   IGGSERUM 1,078 06/20/2019   IGA 457 (H) 06/20/2019   IGMSERUM 80 06/20/2019   Lab Results  Component Value Date   TOTALPROTELP 6.2 06/20/2019   ALBUMINELP 3.2 06/20/2019   A1GS 0.3 06/20/2019   A2GS 0.6 06/20/2019   BETS 1.2 06/20/2019   GAMS 0.9 06/20/2019   MSPIKE Not Observed 06/20/2019     Chemistry      Component Value Date/Time   NA 134 (L) 10/07/2019 1115   NA 137 05/13/2019 1110   K 4.6 10/07/2019 1115   CL  101 10/07/2019 1115   CO2 27 10/07/2019 1115   BUN 8 10/07/2019 1115   BUN 7 (L) 05/13/2019 1110   CREATININE 0.63 10/07/2019 1115      Component Value Date/Time   CALCIUM 9.2 10/07/2019 1115   ALKPHOS  190 (H) 10/07/2019 1115   AST 49 (H) 10/07/2019 1115   ALT 25 10/07/2019 1115   BILITOT 1.9 (H) 10/07/2019 1115      Impression and Plan: Mr. Filter is a very pleasant 64 yo caucasian gentleman with metastatic hepatocellular carcinoma.   I am happy that his quality of life is doing well.  I just wish his alpha-fetoprotein would have gone down a little bit.  However, it is certainly conceivable that it might still come down.  We will have to see what the CT scan shows when we restage him.  We will do this in 2 weeks.  I will plan to see him back in 3 weeks.     Volanda Napoleon, MD 9/3/202110:57 AM

## 2019-10-28 NOTE — Progress Notes (Signed)
Patient needs CT prior to his next appointment. Scheduled CT scan. Called patient's wife Kerry Adams, and instructed her to go by radiology today when she picked up patient to obtain contrast and testing instructions.   Printed calendar for patient and wrote out contrast and NPO instruction on the calendar. Reviewed with patient.  Oncology Nurse Navigator Documentation  Oncology Nurse Navigator Flowsheets 10/28/2019  Abnormal Finding Date -  Confirmed Diagnosis Date -  Diagnosis Status -  Planned Course of Treatment -  Phase of Treatment -  Chemotherapy Actual Start Date: -  Chemo/Radiation Concurrent Pending- Reason: -  Radiation Actual Start Date: -  Navigator Follow Up Date: 11/11/2019  Navigator Follow Up Reason: Radiology  Navigator Location [facility]  Referral Date to RadOnc/MedOnc -  Navigator Encounter Type Treatment  Telephone -  Treatment Initiated Date -  Patient Visit Type MedOnc  Treatment Phase Active Tx  Barriers/Navigation Needs Anxiety;Food Disparities;Mobility Issues;Morbidities/Frailty;Multiple Hospital Admissions;Pain  Education Other  Interventions Coordination of Care;Education;Psycho-Social Support  Acuity Level 2-Minimal Needs (1-2 Barriers Identified)  Referrals -  Coordination of Care Radiology  Education Method Verbal;Written  Support Groups/Services Friends and Family  Time Spent with Patient 40

## 2019-10-28 NOTE — Patient Instructions (Signed)
[facility] Discharge Instructions for Patients Receiving Chemotherapy  Today you received the following chemotherapy agents Tecentriq and Becacizumab  To help prevent nausea and vomiting after your treatment, we encourage you to take your nausea medication as prescribed by MD.   If you develop nausea and vomiting that is not controlled by your nausea medication, call the clinic.   BELOW ARE SYMPTOMS THAT SHOULD BE REPORTED IMMEDIATELY:  *FEVER GREATER THAN 100.5 F  *CHILLS WITH OR WITHOUT FEVER  NAUSEA AND VOMITING THAT IS NOT CONTROLLED WITH YOUR NAUSEA MEDICATION  *UNUSUAL SHORTNESS OF BREATH  *UNUSUAL BRUISING OR BLEEDING  TENDERNESS IN MOUTH AND THROAT WITH OR WITHOUT PRESENCE OF ULCERS  *URINARY PROBLEMS  *BOWEL PROBLEMS  UNUSUAL RASH Items with * indicate a potential emergency and should be followed up as soon as possible.  Feel free to call the clinic should you have any questions or concerns. The clinic phone number is [phone].  Please show the CHEMO ALERT CARD at check-in to the Emergency Department and triage nurse.   

## 2019-10-29 LAB — T4: T4, Total: 5.8 ug/dL (ref 4.5–12.0)

## 2019-10-29 LAB — AFP TUMOR MARKER: AFP, Serum, Tumor Marker: 327 ng/mL — ABNORMAL HIGH (ref 0.0–8.3)

## 2019-11-01 LAB — TSH: TSH: 1.4 u[IU]/mL (ref 0.320–4.118)

## 2019-11-01 LAB — LACTATE DEHYDROGENASE: LDH: 161 U/L (ref 98–192)

## 2019-11-11 ENCOUNTER — Other Ambulatory Visit: Payer: Self-pay

## 2019-11-11 ENCOUNTER — Telehealth: Payer: Self-pay | Admitting: *Deleted

## 2019-11-11 ENCOUNTER — Ambulatory Visit (HOSPITAL_BASED_OUTPATIENT_CLINIC_OR_DEPARTMENT_OTHER)
Admission: RE | Admit: 2019-11-11 | Discharge: 2019-11-11 | Disposition: A | Discharge status: Home / Self Care | Payer: Medicare Other | Source: Ambulatory Visit | Attending: Hematology & Oncology | Admitting: Hematology & Oncology

## 2019-11-11 DIAGNOSIS — C7801 Secondary malignant neoplasm of right lung: Secondary | ICD-10-CM | POA: Insufficient documentation

## 2019-11-11 DIAGNOSIS — C220 Liver cell carcinoma: Secondary | ICD-10-CM | POA: Diagnosis present

## 2019-11-11 MED ORDER — IOHEXOL 300 MG/ML  SOLN
100.0000 mL | Freq: Once | INTRAMUSCULAR | Status: AC | PRN
  Administered 2019-11-11: 100 mL via INTRAVENOUS

## 2019-11-11 NOTE — Telephone Encounter (Signed)
-----   Message from Volanda Napoleon, MD sent at 11/11/2019  4:31 PM EDT ----- Call - the cancer seems to be responding!!!  Tumors are slightly smaller!!! There is 1 lung tumor that might be larger. We can see if XRT might help this!! Laurey Arrow

## 2019-11-11 NOTE — Telephone Encounter (Signed)
As noted below by Dr. Marin Olp, I informed the patient that the cancer seems to be responding. The tumors are slightly smaller. There is one lung tumor that might be larger but Dr. Marin Olp is going to see if radiation might help that area. He and his wife verbalized understanding.

## 2019-11-14 ENCOUNTER — Telehealth: Payer: Self-pay

## 2019-11-14 NOTE — Telephone Encounter (Signed)
-----   Message from Volanda Napoleon, MD sent at 11/14/2019  7:13 AM EDT ----- Call - the cancer is shrinking!!!  Laurey Arrow

## 2019-11-14 NOTE — Telephone Encounter (Signed)
Called and informed patient of CT results and informed him per Dr.Ennever cancer is shrinking. Patient verbalized understanding and denies any questions or concerns.

## 2019-11-18 ENCOUNTER — Encounter: Payer: Self-pay | Admitting: Hematology & Oncology

## 2019-11-18 ENCOUNTER — Inpatient Hospital Stay: Payer: Medicare Other

## 2019-11-18 ENCOUNTER — Encounter: Payer: Self-pay | Admitting: *Deleted

## 2019-11-18 ENCOUNTER — Other Ambulatory Visit: Payer: Self-pay

## 2019-11-18 ENCOUNTER — Inpatient Hospital Stay (HOSPITAL_BASED_OUTPATIENT_CLINIC_OR_DEPARTMENT_OTHER): Payer: Medicare Other | Admitting: Hematology & Oncology

## 2019-11-18 VITALS — BP 106/78 | HR 88 | Temp 98.5°F | Resp 18 | Wt 187.0 lb

## 2019-11-18 DIAGNOSIS — I2510 Atherosclerotic heart disease of native coronary artery without angina pectoris: Secondary | ICD-10-CM

## 2019-11-18 DIAGNOSIS — C220 Liver cell carcinoma: Secondary | ICD-10-CM

## 2019-11-18 DIAGNOSIS — C7800 Secondary malignant neoplasm of unspecified lung: Secondary | ICD-10-CM

## 2019-11-18 DIAGNOSIS — Z5112 Encounter for antineoplastic immunotherapy: Secondary | ICD-10-CM | POA: Diagnosis not present

## 2019-11-18 DIAGNOSIS — C7801 Secondary malignant neoplasm of right lung: Secondary | ICD-10-CM

## 2019-11-18 LAB — CBC WITH DIFFERENTIAL (CANCER CENTER ONLY)
Abs Immature Granulocytes: 0.02 10*3/uL (ref 0.00–0.07)
Basophils Absolute: 0.1 10*3/uL (ref 0.0–0.1)
Basophils Relative: 1 %
Eosinophils Absolute: 0.1 10*3/uL (ref 0.0–0.5)
Eosinophils Relative: 2 %
HCT: 43.2 % (ref 39.0–52.0)
Hemoglobin: 14.2 g/dL (ref 13.0–17.0)
Immature Granulocytes: 1 %
Lymphocytes Relative: 24 %
Lymphs Abs: 1 10*3/uL (ref 0.7–4.0)
MCH: 31.2 pg (ref 26.0–34.0)
MCHC: 32.9 g/dL (ref 30.0–36.0)
MCV: 94.9 fL (ref 80.0–100.0)
Monocytes Absolute: 0.5 10*3/uL (ref 0.1–1.0)
Monocytes Relative: 13 %
Neutro Abs: 2.4 10*3/uL (ref 1.7–7.7)
Neutrophils Relative %: 59 %
Platelet Count: 119 10*3/uL — ABNORMAL LOW (ref 150–400)
RBC: 4.55 MIL/uL (ref 4.22–5.81)
RDW: 15.2 % (ref 11.5–15.5)
WBC Count: 4 10*3/uL (ref 4.0–10.5)
nRBC: 0 % (ref 0.0–0.2)

## 2019-11-18 LAB — CMP (CANCER CENTER ONLY)
ALT: 35 U/L (ref 0–44)
AST: 79 U/L — ABNORMAL HIGH (ref 15–41)
Albumin: 2.9 g/dL — ABNORMAL LOW (ref 3.5–5.0)
Alkaline Phosphatase: 191 U/L — ABNORMAL HIGH (ref 38–126)
Anion gap: 9 (ref 5–15)
BUN: 10 mg/dL (ref 8–23)
CO2: 21 mmol/L — ABNORMAL LOW (ref 22–32)
Calcium: 9.4 mg/dL (ref 8.9–10.3)
Chloride: 109 mmol/L (ref 98–111)
Creatinine: 0.71 mg/dL (ref 0.61–1.24)
GFR, Est AFR Am: >60 mL/min (ref >60)
GFR, Estimated: >60 mL/min (ref >60)
Glucose, Bld: 149 mg/dL — ABNORMAL HIGH (ref 70–99)
Potassium: 4.6 mmol/L (ref 3.5–5.1)
Sodium: 139 mmol/L (ref 135–145)
Total Bilirubin: 1.4 mg/dL — ABNORMAL HIGH (ref 0.3–1.2)
Total Protein: 6.4 g/dL — ABNORMAL LOW (ref 6.5–8.1)

## 2019-11-18 LAB — TOTAL PROTEIN, URINE DIPSTICK: Protein, ur: NEGATIVE mg/dL

## 2019-11-18 LAB — TSH: TSH: 2.135 u[IU]/mL (ref 0.320–4.118)

## 2019-11-18 LAB — LACTATE DEHYDROGENASE: LDH: 186 U/L (ref 98–192)

## 2019-11-18 MED ORDER — SODIUM CHLORIDE 0.9 % IV SOLN
Freq: Once | INTRAVENOUS | Status: AC
  Filled 2019-11-18: qty 250

## 2019-11-18 MED ORDER — SODIUM CHLORIDE 0.9 % IV SOLN
1200.0000 mg | Freq: Once | INTRAVENOUS | Status: AC
  Administered 2019-11-18: 1200 mg via INTRAVENOUS
  Filled 2019-11-18: qty 48

## 2019-11-18 MED ORDER — SODIUM CHLORIDE 0.9 % IV SOLN
1200.0000 mg | Freq: Once | INTRAVENOUS | Status: AC
  Administered 2019-11-18: 1200 mg via INTRAVENOUS
  Filled 2019-11-18: qty 20

## 2019-11-18 NOTE — Progress Notes (Signed)
Visited with patient in infusion. He says he feels "the best I've felt" today. He is planning a weekend trip next weekend.   Dr Marin Olp will refer patient to Dr Sondra Come. Referral order placed.   Oncology Nurse Navigator Documentation  Oncology Nurse Navigator Flowsheets 11/18/2019  Abnormal Finding Date -  Confirmed Diagnosis Date -  Diagnosis Status -  Planned Course of Treatment -  Phase of Treatment -  Chemotherapy Actual Start Date: -  Chemo/Radiation Concurrent Pending- Reason: -  Radiation Actual Start Date: -  Navigator Follow Up Date: 12/09/2019  Navigator Follow Up Reason: Follow-up Appointment;Chemotherapy  Navigator Location [facility]  Referral Date to RadOnc/MedOnc -  Navigator Encounter Type Treatment  Telephone -  Treatment Initiated Date -  Patient Visit Type MedOnc  Treatment Phase Active Tx  Barriers/Navigation Needs Anxiety;Food Disparities;Mobility Issues;Morbidities/Frailty;Multiple Hospital Admissions;Pain  Education -  Interventions Psycho-Social Support;Referrals  Acuity Level 2-Minimal Needs (1-2 Barriers Identified)  Referrals Radiation Oncology  Coordination of Care -  Education Method -  Support Groups/Services Friends and Family  Time Spent with Patient 52

## 2019-11-18 NOTE — Patient Instructions (Signed)
[facility] Discharge Instructions for Patients Receiving Chemotherapy  Today you received the following chemotherapy agents Tecentriq and Becacizumab  To help prevent nausea and vomiting after your treatment, we encourage you to take your nausea medication as prescribed by MD.   If you develop nausea and vomiting that is not controlled by your nausea medication, call the clinic.   BELOW ARE SYMPTOMS THAT SHOULD BE REPORTED IMMEDIATELY:  *FEVER GREATER THAN 100.5 F  *CHILLS WITH OR WITHOUT FEVER  NAUSEA AND VOMITING THAT IS NOT CONTROLLED WITH YOUR NAUSEA MEDICATION  *UNUSUAL SHORTNESS OF BREATH  *UNUSUAL BRUISING OR BLEEDING  TENDERNESS IN MOUTH AND THROAT WITH OR WITHOUT PRESENCE OF ULCERS  *URINARY PROBLEMS  *BOWEL PROBLEMS  UNUSUAL RASH Items with * indicate a potential emergency and should be followed up as soon as possible.  Feel free to call the clinic should you have any questions or concerns. The clinic phone number is [phone].  Please show the CHEMO ALERT CARD at check-in to the Emergency Department and triage nurse.   

## 2019-11-18 NOTE — Progress Notes (Signed)
Hematology and Oncology Follow Up Visit  ASHANTE SNELLING [id number] 1955-03-23 64 y.o. 11/18/2019   Principle Diagnosis:  Hepatocellular carcinoma -- mets to bone, lung, lymph nodes  Current Therapy:        Palliative Radiation to the RIGHT femur Tecentriq/Avastin -- started 08/05/2019, s/p cycle #3 Xgeva 120 mg sq q 3 months -- next dose in 01/2020   Interim History:  Mr. Cott is here today with his wife.  Overall, I think he is doing okay.  His alpha-fetoprotein has been going up a little bit.  Last time we checked it was 327.  We did do scans on him.  The scans actually seem to show that he is responding for the most part.  However, there does seem to be a little bit of tumor growth in the right anterior chest wall.  Was in the liver seems to be decreased in size.  Where he had radiation therapy to the right hip area this is come down quite nicely.  Now measures 3.3 x 4.3 cm.  A right adrenal nodule went from 4.4 x 3.1 cm down to 1 cm.  I think it is apparent that radiation is quite helpful for him.  I will see about having radiation for this anterior right chest wall mass.  He is getting around a little better.  He has been quite active.  His wife is really done a good job with him.  His appetite is good.  He has had no nausea or vomiting.  There is been no change in bowel or bladder habits.  He has had no rashes.  There has been no leg swelling.  Overall, his performance status is ECOG 1.    Medications:  Allergies as of 11/18/2019   No Known Allergies     Medication List       Accurate as of November 18, 2019  9:52 AM. If you have any questions, ask your nurse or doctor.        acetaminophen 500 MG tablet Commonly known as: TYLENOL Take 500 mg by mouth every 8 (eight) hours as needed (pain).   amoxicillin 500 MG capsule Commonly known as: AMOXIL Prior to dental appointments   diazepam 2 MG tablet Commonly known as: VALIUM Take 1 tablet (2 mg total) by mouth  every 12 (twelve) hours as needed for anxiety.   famotidine 10 MG tablet Commonly known as: PEPCID Take 10 mg by mouth every morning.   metoprolol tartrate 100 MG tablet Commonly known as: LOPRESSOR Take 1 tablet (100 mg total) by mouth 2 (two) times daily.   multivitamin with minerals Tabs tablet Take 1 tablet by mouth daily.   ondansetron 8 MG tablet Commonly known as: ZOFRAN Take by mouth every 8 (eight) hours as needed.   oxycodone 5 MG capsule Commonly known as: OXY-IR Take 5 mg by mouth 4 (four) times daily as needed.   polyethylene glycol powder 17 GM/SCOOP powder Commonly known as: GLYCOLAX/MIRALAX Take 1 Container by mouth daily as needed.   Potassium 99 MG Tabs Take 99 mg by mouth daily.   prochlorperazine 10 MG tablet Commonly known as: COMPAZINE Take 10 mg by mouth every 6 (six) hours as needed for nausea or vomiting.   rivaroxaban 20 MG Tabs tablet Commonly known as: Xarelto Take 1 tablet (20 mg total) by mouth daily with supper.   traMADol 50 MG tablet Commonly known as: ULTRAM TAKE 1 TABLET BY MOUTH EVERY 6 HOURS AS NEEDED   trolamine salicylate  10 % cream Commonly known as: ASPERCREME Apply 1 application topically as needed for muscle pain.       Allergies: No Known Allergies  Past Medical History, Surgical history, Social history, and Family History were reviewed and updated.  Review of Systems: Review of Systems  Constitutional: Negative.   HENT: Negative.   Eyes: Negative.   Respiratory: Negative.   Cardiovascular: Negative.   Gastrointestinal: Negative.   Genitourinary: Negative.   Musculoskeletal: Positive for back pain, falls and joint pain.  Skin: Negative.   Neurological: Positive for focal weakness.  Endo/Heme/Allergies: Negative.   Psychiatric/Behavioral: Negative.      Physical Exam:  weight is 187 lb (84.8 kg). His oral temperature is 98.5 F (36.9 C). His blood pressure is 106/78 and his pulse is 88. His respiration is  18 and oxygen saturation is 100%.   Wt Readings from Last 3 Encounters:  11/18/19 187 lb (84.8 kg)  10/28/19 186 lb (84.4 kg)  10/12/19 184 lb (83.5 kg)    Physical Exam Vitals reviewed.  HENT:     Head: Normocephalic and atraumatic.  Eyes:     Pupils: Pupils are equal, round, and reactive to light.  Cardiovascular:     Rate and Rhythm: Normal rate and regular rhythm.     Heart sounds: Normal heart sounds.  Pulmonary:     Effort: Pulmonary effort is normal.     Breath sounds: Normal breath sounds.  Abdominal:     General: Bowel sounds are normal.     Palpations: Abdomen is soft.  Musculoskeletal:        General: No tenderness or deformity. Normal range of motion.     Cervical back: Normal range of motion.  Lymphadenopathy:     Cervical: No cervical adenopathy.  Skin:    General: Skin is warm and dry.     Findings: No erythema or rash.  Neurological:     Mental Status: He is alert and oriented to person, place, and time.  Psychiatric:        Behavior: Behavior normal.        Thought Content: Thought content normal.        Judgment: Judgment normal.      Lab Results  Component Value Date   WBC 4.0 11/18/2019   HGB 14.2 11/18/2019   HCT 43.2 11/18/2019   MCV 94.9 11/18/2019   PLT 119 (L) 11/18/2019   Lab Results  Component Value Date   FERRITIN 738 (H) 08/18/2019   IRON 63 08/18/2019   TIBC 341 08/18/2019   UIBC 278 08/18/2019   IRONPCTSAT 18 (L) 08/18/2019   Lab Results  Component Value Date   RBC 4.55 11/18/2019   Lab Results  Component Value Date   KPAFRELGTCHN 23.3 (H) 06/20/2019   LAMBDASER 19.9 06/20/2019   KAPLAMBRATIO 1.17 06/20/2019   Lab Results  Component Value Date   IGGSERUM 1,078 06/20/2019   IGA 457 (H) 06/20/2019   IGMSERUM 80 06/20/2019   Lab Results  Component Value Date   TOTALPROTELP 6.2 06/20/2019   ALBUMINELP 3.2 06/20/2019   A1GS 0.3 06/20/2019   A2GS 0.6 06/20/2019   BETS 1.2 06/20/2019   GAMS 0.9 06/20/2019    MSPIKE Not Observed 06/20/2019     Chemistry      Component Value Date/Time   NA 139 11/18/2019 0823   NA 137 05/13/2019 1110   K 4.6 11/18/2019 0823   CL 109 11/18/2019 0823   CO2 21 (L) 11/18/2019 0823   BUN  10 11/18/2019 0823   BUN 7 (L) 05/13/2019 1110   CREATININE 0.71 11/18/2019 0823      Component Value Date/Time   CALCIUM 9.4 11/18/2019 0823   ALKPHOS 191 (H) 11/18/2019 0823   AST 79 (H) 11/18/2019 0823   ALT 35 11/18/2019 0823   BILITOT 1.4 (H) 11/18/2019 0823      Impression and Plan: Mr. Frei is a very pleasant 64 yo caucasian gentleman with metastatic hepatocellular carcinoma.   I am happy that his quality of life is doing well.  I really do think that everything is working for him.  We will continue on the immunotherapy along with Avastin.  I will see if Dr. Sondra Come of Radiation Oncology will see him next week for the radiation to the right anterior chest wall mass.  We will continue to follow him up and see him back in 3 weeks.   Volanda Napoleon, MD 9/24/20219:52 AM

## 2019-11-19 LAB — T4: T4, Total: 5.7 ug/dL (ref 4.5–12.0)

## 2019-11-19 LAB — AFP TUMOR MARKER: AFP, Serum, Tumor Marker: 284 ng/mL — ABNORMAL HIGH (ref 0.0–8.3)

## 2019-11-21 NOTE — Progress Notes (Signed)
Radiation Oncology         [phone] ________________________________  Name: Kerry Adams MRN: [id number]  Date: 11/23/2019  DOB: 1955/04/28  Re-Evaluation Note  CC: Vicenta Aly, FNP  Volanda Napoleon, MD    ICD-10-CM   1. Hepatocellular carcinoma metastatic to lung, unspecified laterality (HCC)  C78.00    C22.0     Diagnosis: Metastatic hepatocellular carcinoma with large right chest/ wall lung metastasis  Narrative:  The patient returns today to discuss radiation treatment options. He was originally seen in consultation on 06/30/2019 and then underwent radiation therapy (30 Gy in 10 fractions directed at the right hip/pelvis from 07/07/2019 to 07/20/2019. He tolerated radiation therapy relatively well with the exception of mild fatigue, diarrhea that was controlled with Imodium, mild nausea, poor appetite, and baseline right hip pain that improved with treatment.  Since the end of treatment, the patient began immuonotherpay with Tecentriq along with Avastin on 08/05/2019 under the care of Dr. Marin Olp.  Of note, the patient was seen in the ED on 08/25/2019 for evaluation s/p fall. CT scan of head at that time showed mild age advanced cerebral atrophy, ventriculomegaly, and periventricular white matter disease. There were no acute intracranial findings or skull fracture. X-ray of right femur showed the continued presence of the destructive lesion involving the greater trochanter of the proximal right femur with extension into the intertrochanteric region and shaft. X-ray of pelvis showed a severe lytic lesion that involved the intertrochanteric region of the proximal right femur that was consistent with malignancy or metastatic disease. No acute fracture or dislocation was noted. Chest x-ray showed a large rounded mass projected over the right lung apex that was consistent with expansile bone lesion involving the right first rib, concerning for malignancy. Although imaging results  did not show any new injuries, his heart rate was consistent with atrial fibrillation with RVR. Thus, he was admitted to the hospital for further management and then discharged home on 09/02/2019.  Repeat CT scan of chest, abdomen, and pelvis on 11/11/2019 showed improvement in the presumed multifocal hepatocellular carcinoma. There was significant improvement in the soft tissue metastasis surrounding the proximal right femur and there was near complete resolution of the right adrenal metastasis. Additionally, there was decrease in trace abdominopelvic ascites with persistent splenomegaly. Finally, there was noted to be osteopenia with a chronic L2 and a new L1 compression deformity.  The patient was last seen by Dr. Marin Olp on 11/18/2019, during which time it was recommended that he proceed with radiation therapy directed at the anterior right chest wall mass. He continues on immunotherapy along with Avastin and has tolerated them quite well without any significant toxicities.  On review of systems, the patient reports right hip pain rated 1/10 and a seven pound weight gain over the summer. He denies cough, shortness of breath, hemoptysis, and any other symptoms.  Over the past 3 weeks he has noticed pain along the right anterior chest particularly when lying down.  He occasionally will have to take any medication for this issue.   Allergies:  has No Known Allergies.  Meds: Current Outpatient Medications  Medication Sig Dispense Refill   acetaminophen (TYLENOL) 500 MG tablet Take 500 mg by mouth every 8 (eight) hours as needed (pain).      amoxicillin (AMOXIL) 500 MG capsule Prior to dental appointments     diazepam (VALIUM) 2 MG tablet Take 1 tablet (2 mg total) by mouth every 12 (twelve) hours as needed for anxiety. 14 tablet  0   famotidine (PEPCID) 10 MG tablet Take 10 mg by mouth every morning.      metoprolol tartrate (LOPRESSOR) 100 MG tablet Take 1 tablet (100 mg total) by mouth 2  (two) times daily. 180 tablet 3   Multiple Vitamin (MULTIVITAMIN WITH MINERALS) TABS tablet Take 1 tablet by mouth daily.     ondansetron (ZOFRAN) 8 MG tablet Take by mouth every 8 (eight) hours as needed.      oxycodone (OXY-IR) 5 MG capsule Take 5 mg by mouth 4 (four) times daily as needed.  (Patient not taking: Reported on 10/28/2019)     polyethylene glycol powder (GLYCOLAX/MIRALAX) 17 GM/SCOOP powder Take 1 Container by mouth daily as needed.     Potassium 99 MG TABS Take 99 mg by mouth daily.     prochlorperazine (COMPAZINE) 10 MG tablet Take 10 mg by mouth every 6 (six) hours as needed for nausea or vomiting.     rivaroxaban (XARELTO) 20 MG TABS tablet Take 1 tablet (20 mg total) by mouth daily with supper. 30 tablet 5   traMADol (ULTRAM) 50 MG tablet TAKE 1 TABLET BY MOUTH EVERY 6 HOURS AS NEEDED 60 tablet 0   trolamine salicylate (ASPERCREME) 10 % cream Apply 1 application topically as needed for muscle pain.     No current facility-administered medications for this encounter.    Physical Findings: The patient is in no acute distress. Patient is alert and oriented.  height is 6\' 1"  (1.854 m) and weight is 192 lb 9.6 oz (87.4 kg). His temperature is 98.2 F (36.8 C). His blood pressure is 127/96 (abnormal) and his pulse is 85. His respiration is 20 and oxygen saturation is 99%.   Lungs are clear to auscultation bilaterally. Heart has regular rate and rhythm. No palpable cervical, supraclavicular, or axillary adenopathy. Abdomen soft, non-tender, normal bowel sounds.  Ambulates with the assistance of a walker.  No palpable or visible swelling along the right anterior chest.  No point tenderness  Lab Findings: Lab Results  Component Value Date   WBC 4.0 11/18/2019   HGB 14.2 11/18/2019   HCT 43.2 11/18/2019   MCV 94.9 11/18/2019   PLT 119 (L) 11/18/2019    Radiographic Findings: CT CHEST ABDOMEN PELVIS W CONTRAST  Result Date: 11/11/2019 CLINICAL DATA:  Hepatocellular  carcinoma. Lung metastasis. Asymptomatic. EXAM: CT CHEST, ABDOMEN, AND PELVIS WITH CONTRAST TECHNIQUE: Multidetector CT imaging of the chest, abdomen and pelvis was performed following the standard protocol during bolus administration of intravenous contrast. CONTRAST:  157mL OMNIPAQUE IOHEXOL 300 MG/ML  SOLN COMPARISON:  06/20/2019. FINDINGS: CT CHEST FINDINGS Cardiovascular: Aortic and branch vessel atherosclerosis. Tortuous thoracic aorta. Moderate cardiomegaly with left atrial enlargement. Multivessel coronary artery atherosclerosis. No central pulmonary embolism, on this non-dedicated study. Mediastinum/Nodes: No supraclavicular adenopathy. Low right paratracheal 6 mm node, decreased from 11 mm on the prior. No hilar adenopathy. Lungs/Pleura: No pleural fluid. Primarily resolved areas of peribronchovascular nodular airspace disease within both lower lobes. Remaining partially calcified right lower lobe nodule of 6 mm on 111/4 is similar to decreased from 8 mm on the prior exam (when remeasured). There is also a more typical right lower lobe calcified granuloma. The pleural-based right anterior upper chest mass with extension into the subpectoral space measures 8.1 x 6.5 cm on 12/02. Compare 7.7 x 6.1 cm on the prior exam. Based on coronal reformats, 8.0 cm today versus 6.4 cm on the prior exam (when remeasured). Musculoskeletal: Similar osseous destruction involving the anterior first right  rib. Remote bilateral rib fractures. CT ABDOMEN PELVIS FINDINGS Hepatobiliary: Advanced cirrhosis. The liver was not evaluated in as early of a phase as on the prior. Comparing delayed images, today's, tumor burden is felt to be decreased. For example, a segment 2 hypoattenuating 1.0 cm lesion on 03/07 measures 1.8 cm on the prior exam (when remeasured). Index segment 3 lesion measures 1.1 cm on 11/07 versus 2.1 cm on the prior exam (when remeasured). Inferior right hepatic lobe 9 mm lesion on 20/7 is decreased from 2.0 cm  on the prior exam (when remeasured). Dependent gallstones with mild gallbladder distension but no specific evidence of acute cholecystitis. No biliary duct dilatation. Pancreas: Pancreatic atrophy. No duct dilatation or acute inflammation. Spleen: Splenomegaly at 16.7 cm craniocaudal, 15.5 cm on the prior. Adrenals/Urinary Tract: Significant decrease in right adrenal nodularity at 1.0 cm today versus 4.4 x 3.1 cm on the prior. Normal left adrenal gland. Normal kidneys, without hydronephrosis. Normal urinary bladder. Degraded evaluation of the pelvis, secondary to beam hardening artifact from right proximal femur fixation. Stomach/Bowel: Gastric antral underdistention. Normal colon, appendix, and terminal ileum. Normal small bowel. Vascular/Lymphatic: Advanced aortic and branch vessel atherosclerosis. Patent portal and splenic veins. No portosystemic collaterals. No abdominopelvic adenopathy. Reproductive: Normal prostate. Other: Decrease in trace abdominopelvic ascites. No evidence of omental or peritoneal disease. Musculoskeletal: Marked decrease in soft tissue fullness/mass surrounding the proximal right femur. Example at 3.3 x 4.3 cm on 108/2 versus 8.0 x 5.9 cm at the same level on the prior exam (when remeasured). Proximal right femur fixation. Osteopenia. L1 mild compression deformity is new since the prior. A moderate L2 compression deformity is similar. Disc bulges at L2-3 and L3-4. IMPRESSION: CT CHEST IMPRESSION 1. Slight enlargement of pleural-based right-sided chest mass with extension into the subpectoral space and involvement of the right first rib. 2. Improvement and primarily resolution of peribronchovascular nodularity detailed on the prior exam. This could represent response to therapy of metastatic disease or resolved infection. 3. Resolved mediastinal adenopathy, reactive versus metastatic. CT ABDOMEN AND PELVIS IMPRESSION 1. Although direct comparison to the prior exam is challenging secondary  to differences in bolus timing, improvement in presumed multifocal hepatocellular carcinoma. 2. Significant improvement in soft tissue metastasis surrounding the proximal right femur. 3. Near complete resolution of right adrenal metastasis. 4. Decrease in trace abdominopelvic ascites with persistent splenomegaly. 5. Osteopenia with chronic L2 and new L1 compression deformity. 6. Cholelithiasis. Electronically Signed   By: Abigail Miyamoto M.D.   On: 11/11/2019 13:13    Impression: Metastatic hepatocellular carcinoma with large right chest/ wall lung metastasis  Patient's most recent imaging shows overall response to his current therapy except for his right chest wall metastasis.  This is slightly enlarged over the past to few months on CT scan.  As above he is becoming symptomatic from this mass.  He would be a good candidate for short course of palliative radiation therapy.  He responded well to his radiation therapy with fractionation scheme of 30 Gy and 10 fx's with his right hip metastasis.  We will plan on using the same fractionation scheme with his current set up and treatment.  Plan:  The patient is scheduled to follow-up with Dr. Marin Olp on 12/09/2019.  Later today the patient will undergo CT simulation with treatments begin early next week.  Anticipate 2 weeks of radiation therapy  Total time spent in this encounter was 35 minutes which included reviewing the patient's most recent ED visit, hospitalization, chest/abdomen/pelvis CT scan, head CT  scan, various x-rays, immunotherapy, follow-ups, physical examination, and documentation.  -----------------------------------  Blair Promise, PhD, MD  This document serves as a record of services personally performed by Gery Pray, MD. It was created on his behalf by Clerance Lav, a trained medical scribe. The creation of this record is based on the scribe's personal observations and the provider's statements to them. This document has been checked  and approved by the attending provider.

## 2019-11-23 ENCOUNTER — Ambulatory Visit
Admission: RE | Admit: 2019-11-23 | Discharge: 2019-11-23 | Disposition: A | Discharge status: Home / Self Care | Payer: Medicare Other | Source: Ambulatory Visit | Attending: Radiation Oncology | Admitting: Radiation Oncology

## 2019-11-23 ENCOUNTER — Other Ambulatory Visit: Payer: Self-pay

## 2019-11-23 ENCOUNTER — Encounter: Payer: Self-pay | Admitting: Radiation Oncology

## 2019-11-23 DIAGNOSIS — C220 Liver cell carcinoma: Secondary | ICD-10-CM

## 2019-11-23 DIAGNOSIS — K7460 Unspecified cirrhosis of liver: Secondary | ICD-10-CM | POA: Diagnosis not present

## 2019-11-23 DIAGNOSIS — R161 Splenomegaly, not elsewhere classified: Secondary | ICD-10-CM | POA: Insufficient documentation

## 2019-11-23 DIAGNOSIS — Z79899 Other long term (current) drug therapy: Secondary | ICD-10-CM | POA: Insufficient documentation

## 2019-11-23 DIAGNOSIS — C7801 Secondary malignant neoplasm of right lung: Secondary | ICD-10-CM | POA: Insufficient documentation

## 2019-11-23 DIAGNOSIS — Z7901 Long term (current) use of anticoagulants: Secondary | ICD-10-CM | POA: Diagnosis not present

## 2019-11-23 DIAGNOSIS — C7951 Secondary malignant neoplasm of bone: Secondary | ICD-10-CM | POA: Diagnosis not present

## 2019-11-23 DIAGNOSIS — C7800 Secondary malignant neoplasm of unspecified lung: Secondary | ICD-10-CM

## 2019-11-23 DIAGNOSIS — M8580 Other specified disorders of bone density and structure, unspecified site: Secondary | ICD-10-CM | POA: Insufficient documentation

## 2019-11-23 DIAGNOSIS — R9082 White matter disease, unspecified: Secondary | ICD-10-CM | POA: Insufficient documentation

## 2019-11-23 MED ORDER — METOPROLOL TARTRATE 100 MG PO TABS: 100.0000 mg | ORAL_TABLET | Freq: Two times a day (BID) | ORAL | 3 refills | Status: AC

## 2019-11-23 NOTE — Progress Notes (Signed)
Patient here for a reconsult with Dr. Sondra Come.  ________________________________  Name: Kerry Adams          MRN: [id number]         Date: 11/23/2019                      DOB: [birth year]  Re-Evaluation Note  CC: Vicenta Aly, FNP  Volanda Napoleon, MD  No diagnosis found.  Diagnosis:  Mass in the right hip - likely sarcomawith metastatic disease to the liveradrenalgland and chest  Narrative:  The patient returns today to discuss radiation treatment options. He was originally seen in consultation on 06/30/2019 and then underwent radiation therapy (30 Gy in 10 fractions directed at the right hip/pelvis from 07/07/2019 to 07/20/2019. He tolerated radiation therapy relatively well with the exception of mild fatigue, diarrhea that was controlled with Imodium, mild nausea, poor appetite, and baseline right hip pain that improved with treatment.  Since the end of treatment, the patient began immuonotherpay with Tecentriq along with Avastin on 08/05/2019 under the care of Dr. Marin Olp.  Of note, the patient was seen in the ED on 08/25/2019 for evaluation s/p fall. CT scan of head at that time showed mild age advanced cerebral atrophy, ventriculomegaly, and periventricular white matter disease. There were no acute intracranial findings or skull fracture. X-ray of right femur showed the continued presence of the destructive lesion involving the greater trochanter of the proximal right femur with extension into the intertrochanteric region and shaft. X-ray of pelvis showed a severe lytic lesion that involved the intertrochanteric region of the proximal right femur that was consistent with malignancy or metastatic disease. No acute fracture or dislocation was noted. Chest x-ray showed a large rounded mass projected over the right lung apex that was consistent with expansile bone lesion involving the right first rib, concerning for malignancy. Although imaging results did not show any new  injuries, his heart rate was consistent with atrial fibrillation with RVR. Thus, he was admitted to the hospital for further management and then discharged home on 09/02/2019.  Repeat CT scan of chest, abdomen, and pelvis on 11/11/2019 showed improvement in the presumed multifocal hepatocellular carcinoma. There was significant improvement in the soft tissue metastasis surrounding the proximal right femur and there was near complete resolution of the right adrenal metastasis. Additionally, there was decrease in trace abdominopelvic ascites with persistent splenomegaly. Finally, there was noted to be osteopenia with a chronic L2 and a new L1 compression deformity.  The patient was last seen by Dr. Marin Olp on 11/18/2019, during which time it was recommended that he proceed with radiation therapy directed at the anterior right chest wall mass. He continues on immunotherapy along with Avastin and has tolerated them quite well without any significant toxicities.   Past/Anticipated interventions by medical oncology, if any:   Signs/Symptoms  Weight changes, if any: 7 lbs gained this summer.  Respiratory complaints, if any: none  Hemoptysis, if any: none  Pain issues, if any: "1" right hip  SAFETY ISSUES:  Prior radiation? yes  Pacemaker/ICD? no  Possible current pregnancy? n/a  Is the patient on methotrexate? no  BP (!) 127/96 (BP Location: Right Arm, Patient Position: Sitting, Cuff Size: Normal)   Pulse 85   Temp 98.2 F (36.8 C)   Resp 20   Ht 6\' 1"  (1.854 m)   Wt 192 lb 9.6 oz (87.4 kg)   SpO2 99%   BMI 25.41 kg/m    Wt Readings from  Last 3 Encounters:  11/23/19 192 lb 9.6 oz (87.4 kg)  11/18/19 187 lb (84.8 kg)  10/28/19 186 lb (84.4 kg)     Current Complaints / other details:  Accompanied by his wife.

## 2019-11-24 ENCOUNTER — Other Ambulatory Visit: Payer: Self-pay | Admitting: Hematology & Oncology

## 2019-11-24 DIAGNOSIS — C7801 Secondary malignant neoplasm of right lung: Secondary | ICD-10-CM | POA: Diagnosis not present

## 2019-11-24 DIAGNOSIS — C220 Liver cell carcinoma: Secondary | ICD-10-CM

## 2019-11-24 DIAGNOSIS — C7951 Secondary malignant neoplasm of bone: Secondary | ICD-10-CM

## 2019-11-24 DIAGNOSIS — C7800 Secondary malignant neoplasm of unspecified lung: Secondary | ICD-10-CM

## 2019-11-29 ENCOUNTER — Encounter: Payer: Self-pay | Admitting: *Deleted

## 2019-11-29 ENCOUNTER — Other Ambulatory Visit: Payer: Self-pay

## 2019-11-29 ENCOUNTER — Ambulatory Visit
Admission: RE | Admit: 2019-11-29 | Discharge: 2019-11-29 | Disposition: A | Discharge status: Home / Self Care | Payer: Medicare Other | Source: Ambulatory Visit | Attending: Radiation Oncology | Admitting: Radiation Oncology

## 2019-11-29 ENCOUNTER — Telehealth: Payer: Self-pay | Admitting: Cardiology

## 2019-11-29 DIAGNOSIS — C220 Liver cell carcinoma: Secondary | ICD-10-CM | POA: Diagnosis present

## 2019-11-29 DIAGNOSIS — C7801 Secondary malignant neoplasm of right lung: Secondary | ICD-10-CM | POA: Insufficient documentation

## 2019-11-29 NOTE — Telephone Encounter (Signed)
1. What dental office are you calling from? [facility]  2. What is your office phone number? [phone] ask for Tanya  3. What is your fax number? [phone]  4. What type of procedure is the patient having performed? Routine cleaning and exam  5. What date is procedure scheduled or is the patient there now? 12/01/19 (if the patient is at the dentist's office question goes to their cardiologist if he/she is in the office.  If not, question should go to the DOD).   6. What is your question (ex. Antibiotics prior to procedure, holding medication-we need to know how long dentist wants pt to hold med)? Wife calling on behalf of dentist office. Dentist office wants to know if the patient is safe to have a routine cleaning and exam  Patient premedicates with amoxicillin

## 2019-11-29 NOTE — Progress Notes (Signed)
Patient's wife calling requesting a letter okaying dental cleaning be sent to [facility].   Letter faxed to [phone]  Oncology Nurse Navigator Documentation  Oncology Nurse Navigator Flowsheets 11/29/2019  Abnormal Finding Date -  Confirmed Diagnosis Date -  Diagnosis Status -  Planned Course of Treatment -  Phase of Treatment -  Chemotherapy Actual Start Date: -  Chemo/Radiation Concurrent Pending- Reason: -  Radiation Actual Start Date: -  Navigator Follow Up Date: 12/09/2019  Navigator Follow Up Reason: Follow-up Appointment  Navigator Location [facility]  Referral Date to RadOnc/MedOnc -  Navigator Encounter Type Telephone  Telephone Incoming Call  Treatment Initiated Date -  Patient Visit Type MedOnc  Treatment Phase Active Tx  Barriers/Navigation Needs Anxiety;Food Disparities;Mobility Issues;Morbidities/Frailty;Multiple Hospital Admissions;Pain  Education -  Interventions Psycho-Social Support;Other  Acuity Level 2-Minimal Needs (1-2 Barriers Identified)  Referrals -  Coordination of Care Other  Education Method -  Support Groups/Services Friends and Family  Time Spent with Patient 30

## 2019-11-29 NOTE — Telephone Encounter (Signed)
   Primary Cardiologist: Dorris Carnes, MD  Chart reviewed as part of pre-operative protocol coverage.   Simple dental extractions and cleanings are considered low risk procedures per guidelines and generally do not require any specific cardiac clearance. It is also generally accepted that for simple extractions and dental cleanings, there is no need to interrupt blood thinner therapy.  No cardiac indication for SBE prophylaxis for the patient, although SBE prophylaxis is listed in his med list.   I will route this recommendation to the requesting party via Epic fax function and remove from pre-op pool.  Please call with questions.  Tami Lin Cristofer Yaffe, PA 11/29/2019, 9:33 AM

## 2019-11-30 ENCOUNTER — Ambulatory Visit
Admission: RE | Admit: 2019-11-30 | Discharge: 2019-11-30 | Disposition: A | Discharge status: Home / Self Care | Payer: Medicare Other | Source: Ambulatory Visit | Attending: Radiation Oncology | Admitting: Radiation Oncology

## 2019-11-30 DIAGNOSIS — C7801 Secondary malignant neoplasm of right lung: Secondary | ICD-10-CM | POA: Diagnosis not present

## 2019-12-01 ENCOUNTER — Ambulatory Visit
Admission: RE | Admit: 2019-12-01 | Discharge: 2019-12-01 | Disposition: A | Discharge status: Home / Self Care | Payer: Medicare Other | Source: Ambulatory Visit | Attending: Radiation Oncology | Admitting: Radiation Oncology

## 2019-12-01 DIAGNOSIS — C7801 Secondary malignant neoplasm of right lung: Secondary | ICD-10-CM | POA: Diagnosis not present

## 2019-12-02 ENCOUNTER — Other Ambulatory Visit: Payer: Self-pay

## 2019-12-02 ENCOUNTER — Ambulatory Visit
Admission: RE | Admit: 2019-12-02 | Discharge: 2019-12-02 | Disposition: A | Discharge status: Home / Self Care | Payer: Medicare Other | Source: Ambulatory Visit | Attending: Radiation Oncology | Admitting: Radiation Oncology

## 2019-12-02 DIAGNOSIS — C7801 Secondary malignant neoplasm of right lung: Secondary | ICD-10-CM | POA: Diagnosis not present

## 2019-12-05 ENCOUNTER — Ambulatory Visit
Admission: RE | Admit: 2019-12-05 | Discharge: 2019-12-05 | Disposition: A | Discharge status: Home / Self Care | Payer: Medicare Other | Source: Ambulatory Visit | Attending: Radiation Oncology | Admitting: Radiation Oncology

## 2019-12-05 ENCOUNTER — Other Ambulatory Visit: Payer: Self-pay

## 2019-12-05 DIAGNOSIS — C7801 Secondary malignant neoplasm of right lung: Secondary | ICD-10-CM | POA: Diagnosis not present

## 2019-12-06 ENCOUNTER — Ambulatory Visit
Admission: RE | Admit: 2019-12-06 | Discharge: 2019-12-06 | Disposition: A | Discharge status: Home / Self Care | Payer: Medicare Other | Source: Ambulatory Visit | Attending: Radiation Oncology | Admitting: Radiation Oncology

## 2019-12-06 DIAGNOSIS — C7801 Secondary malignant neoplasm of right lung: Secondary | ICD-10-CM | POA: Diagnosis not present

## 2019-12-07 ENCOUNTER — Ambulatory Visit
Admission: RE | Admit: 2019-12-07 | Discharge: 2019-12-07 | Disposition: A | Discharge status: Home / Self Care | Payer: Medicare Other | Source: Ambulatory Visit | Attending: Radiation Oncology | Admitting: Radiation Oncology

## 2019-12-07 DIAGNOSIS — C7801 Secondary malignant neoplasm of right lung: Secondary | ICD-10-CM | POA: Diagnosis not present

## 2019-12-08 ENCOUNTER — Ambulatory Visit
Admission: RE | Admit: 2019-12-08 | Discharge: 2019-12-08 | Disposition: A | Discharge status: Home / Self Care | Payer: Medicare Other | Source: Ambulatory Visit | Attending: Radiation Oncology | Admitting: Radiation Oncology

## 2019-12-08 ENCOUNTER — Other Ambulatory Visit: Payer: Self-pay

## 2019-12-08 ENCOUNTER — Ambulatory Visit (HOSPITAL_COMMUNITY): Payer: Medicare Other | Attending: Cardiology

## 2019-12-08 DIAGNOSIS — I429 Cardiomyopathy, unspecified: Secondary | ICD-10-CM | POA: Diagnosis present

## 2019-12-08 DIAGNOSIS — C7801 Secondary malignant neoplasm of right lung: Secondary | ICD-10-CM | POA: Diagnosis not present

## 2019-12-08 DIAGNOSIS — I4819 Other persistent atrial fibrillation: Secondary | ICD-10-CM | POA: Diagnosis not present

## 2019-12-08 LAB — ECHOCARDIOGRAM COMPLETE
AR max vel: 1.61 cm2
AV Area VTI: 1.79 cm2
AV Area mean vel: 1.58 cm2
AV Mean grad: 6.6 mmHg
AV Peak grad: 10.4 mmHg
Ao pk vel: 1.61 m/s
S' Lateral: 2.6 cm

## 2019-12-09 ENCOUNTER — Encounter: Payer: Self-pay | Admitting: *Deleted

## 2019-12-09 ENCOUNTER — Ambulatory Visit
Admission: RE | Admit: 2019-12-09 | Discharge: 2019-12-09 | Disposition: A | Discharge status: Home / Self Care | Payer: Medicare Other | Source: Ambulatory Visit | Attending: Radiation Oncology | Admitting: Radiation Oncology

## 2019-12-09 ENCOUNTER — Encounter: Payer: Self-pay | Admitting: Hematology & Oncology

## 2019-12-09 ENCOUNTER — Inpatient Hospital Stay: Payer: Medicare Other

## 2019-12-09 ENCOUNTER — Inpatient Hospital Stay: Payer: Medicare Other | Attending: Hematology & Oncology | Admitting: Hematology & Oncology

## 2019-12-09 VITALS — BP 103/78 | HR 95 | Temp 97.4°F | Resp 16 | Wt 197.0 lb

## 2019-12-09 VITALS — BP 126/83 | HR 89 | Temp 97.9°F | Resp 17

## 2019-12-09 DIAGNOSIS — C7951 Secondary malignant neoplasm of bone: Secondary | ICD-10-CM | POA: Diagnosis not present

## 2019-12-09 DIAGNOSIS — C220 Liver cell carcinoma: Secondary | ICD-10-CM | POA: Insufficient documentation

## 2019-12-09 DIAGNOSIS — Z5112 Encounter for antineoplastic immunotherapy: Secondary | ICD-10-CM | POA: Insufficient documentation

## 2019-12-09 DIAGNOSIS — Z79899 Other long term (current) drug therapy: Secondary | ICD-10-CM | POA: Diagnosis not present

## 2019-12-09 DIAGNOSIS — I2510 Atherosclerotic heart disease of native coronary artery without angina pectoris: Secondary | ICD-10-CM | POA: Diagnosis not present

## 2019-12-09 DIAGNOSIS — R531 Weakness: Secondary | ICD-10-CM | POA: Diagnosis not present

## 2019-12-09 DIAGNOSIS — C7800 Secondary malignant neoplasm of unspecified lung: Secondary | ICD-10-CM | POA: Insufficient documentation

## 2019-12-09 DIAGNOSIS — C7801 Secondary malignant neoplasm of right lung: Secondary | ICD-10-CM | POA: Diagnosis not present

## 2019-12-09 DIAGNOSIS — M549 Dorsalgia, unspecified: Secondary | ICD-10-CM | POA: Diagnosis not present

## 2019-12-09 LAB — CBC WITH DIFFERENTIAL (CANCER CENTER ONLY)
Abs Immature Granulocytes: 0 10*3/uL (ref 0.00–0.07)
Basophils Absolute: 0 10*3/uL (ref 0.0–0.1)
Basophils Relative: 1 %
Eosinophils Absolute: 0.1 10*3/uL (ref 0.0–0.5)
Eosinophils Relative: 2 %
HCT: 41.3 % (ref 39.0–52.0)
Hemoglobin: 13.4 g/dL (ref 13.0–17.0)
Immature Granulocytes: 0 %
Lymphocytes Relative: 13 %
Lymphs Abs: 0.4 10*3/uL — ABNORMAL LOW (ref 0.7–4.0)
MCH: 32.2 pg (ref 26.0–34.0)
MCHC: 32.4 g/dL (ref 30.0–36.0)
MCV: 99.3 fL (ref 80.0–100.0)
Monocytes Absolute: 0.7 10*3/uL (ref 0.1–1.0)
Monocytes Relative: 20 %
Neutro Abs: 2.2 10*3/uL (ref 1.7–7.7)
Neutrophils Relative %: 64 %
Platelet Count: 104 10*3/uL — ABNORMAL LOW (ref 150–400)
RBC: 4.16 MIL/uL — ABNORMAL LOW (ref 4.22–5.81)
RDW: 17.5 % — ABNORMAL HIGH (ref 11.5–15.5)
WBC Count: 3.4 10*3/uL — ABNORMAL LOW (ref 4.0–10.5)
nRBC: 0 % (ref 0.0–0.2)

## 2019-12-09 LAB — CMP (CANCER CENTER ONLY)
ALT: 34 U/L (ref 0–44)
AST: 100 U/L — ABNORMAL HIGH (ref 15–41)
Albumin: 2.9 g/dL — ABNORMAL LOW (ref 3.5–5.0)
Alkaline Phosphatase: 162 U/L — ABNORMAL HIGH (ref 38–126)
Anion gap: 5 (ref 5–15)
BUN: 15 mg/dL (ref 8–23)
CO2: 29 mmol/L (ref 22–32)
Calcium: 8.9 mg/dL (ref 8.9–10.3)
Chloride: 105 mmol/L (ref 98–111)
Creatinine: 0.67 mg/dL (ref 0.61–1.24)
GFR, Estimated: >60 mL/min (ref >60)
Glucose, Bld: 128 mg/dL — ABNORMAL HIGH (ref 70–99)
Potassium: 4.3 mmol/L (ref 3.5–5.1)
Sodium: 139 mmol/L (ref 135–145)
Total Bilirubin: 1.6 mg/dL — ABNORMAL HIGH (ref 0.3–1.2)
Total Protein: 5.8 g/dL — ABNORMAL LOW (ref 6.5–8.1)

## 2019-12-09 LAB — LACTATE DEHYDROGENASE: LDH: 179 U/L (ref 98–192)

## 2019-12-09 LAB — TOTAL PROTEIN, URINE DIPSTICK: Protein, ur: NEGATIVE mg/dL

## 2019-12-09 MED ORDER — SODIUM CHLORIDE 0.9 % IV SOLN
Freq: Once | INTRAVENOUS | Status: AC
  Filled 2019-12-09: qty 250

## 2019-12-09 MED ORDER — SODIUM CHLORIDE 0.9 % IV SOLN
1200.0000 mg | Freq: Once | INTRAVENOUS | Status: AC
  Administered 2019-12-09: 1200 mg via INTRAVENOUS
  Filled 2019-12-09: qty 48

## 2019-12-09 MED ORDER — SODIUM CHLORIDE 0.9 % IV SOLN
1200.0000 mg | Freq: Once | INTRAVENOUS | Status: AC
  Administered 2019-12-09: 1200 mg via INTRAVENOUS
  Filled 2019-12-09: qty 20

## 2019-12-09 NOTE — Progress Notes (Signed)
Hematology and Oncology Follow Up Visit  Kerry Adams [id number] [birth year] 64 y.o. 12/09/2019   Principle Diagnosis:  Hepatocellular carcinoma -- mets to bone, lung, lymph nodes  Current Therapy:        Palliative Radiation to the RIGHT femur Tecentriq/Avastin -- started 08/05/2019, s/p cycle #3 Xgeva 120 mg sq q 3 months -- next dose in 01/2020   Interim History:  Kerry Adams is here today with his wife.  Overall, I think he is doing okay.  His alpha-fetoprotein actually went down the last time I saw him.  It was down to 287.  He is getting his radiation therapy right now.  This is for the pleural-based mass on the right pleural/chest wall.  He had a treatments.  He says he feels better.  There is not much pain in that area.  He has had no problem with bowels or bladder.  He has had no diarrhea.  There is been no melena or bright red blood per rectum.  He has had no issues with leg swelling.  He did sustain little bit of a cut on the right lower leg.  This is dressed.  Overall, his performance status is ECOG 1.     Medications:  Allergies as of 12/09/2019   No Known Allergies     Medication List       Accurate as of December 09, 2019  9:46 AM. If you have any questions, ask your nurse or doctor.        acetaminophen 500 MG tablet Commonly known as: TYLENOL Take 500 mg by mouth every 8 (eight) hours as needed (pain).   amoxicillin 500 MG capsule Commonly known as: AMOXIL Prior to dental appointments   diazepam 2 MG tablet Commonly known as: VALIUM Take 1 tablet (2 mg total) by mouth every 12 (twelve) hours as needed for anxiety.   famotidine 10 MG tablet Commonly known as: PEPCID Take 10 mg by mouth every morning.   metoprolol tartrate 100 MG tablet Commonly known as: LOPRESSOR Take 1 tablet (100 mg total) by mouth 2 (two) times daily.   multivitamin with minerals Tabs tablet Take 1 tablet by mouth daily.   ondansetron 8 MG tablet Commonly known as:  ZOFRAN Take by mouth every 8 (eight) hours as needed.   oxycodone 5 MG capsule Commonly known as: OXY-IR Take 5 mg by mouth 4 (four) times daily as needed.   polyethylene glycol powder 17 GM/SCOOP powder Commonly known as: GLYCOLAX/MIRALAX Take 1 Container by mouth daily as needed.   Potassium 99 MG Tabs Take 99 mg by mouth daily.   prochlorperazine 10 MG tablet Commonly known as: COMPAZINE Take 10 mg by mouth every 6 (six) hours as needed for nausea or vomiting.   rivaroxaban 20 MG Tabs tablet Commonly known as: Xarelto Take 1 tablet (20 mg total) by mouth daily with supper.   traMADol 50 MG tablet Commonly known as: ULTRAM TAKE 1 TABLET BY MOUTH EVERY 6 HOURS AS NEEDED   trolamine salicylate 10 % cream Commonly known as: ASPERCREME Apply 1 application topically as needed for muscle pain.       Allergies: No Known Allergies  Past Medical History, Surgical history, Social history, and Family History were reviewed and updated.  Review of Systems: Review of Systems  Constitutional: Negative.   HENT: Negative.   Eyes: Negative.   Respiratory: Negative.   Cardiovascular: Negative.   Gastrointestinal: Negative.   Genitourinary: Negative.   Musculoskeletal: Positive for back pain, falls  and joint pain.  Skin: Negative.   Neurological: Positive for focal weakness.  Endo/Heme/Allergies: Negative.   Psychiatric/Behavioral: Negative.      Physical Exam:  weight is 197 lb (89.4 kg). His oral temperature is 97.4 F (36.3 C) (abnormal). His blood pressure is 103/78 and his pulse is 95. His respiration is 16 and oxygen saturation is 100%.   Wt Readings from Last 3 Encounters:  12/09/19 197 lb (89.4 kg)  11/23/19 192 lb 9.6 oz (87.4 kg)  11/18/19 187 lb (84.8 kg)    Physical Exam Vitals reviewed.  HENT:     Head: Normocephalic and atraumatic.  Eyes:     Pupils: Pupils are equal, round, and reactive to light.  Cardiovascular:     Rate and Rhythm: Normal rate and  regular rhythm.     Heart sounds: Normal heart sounds.  Pulmonary:     Effort: Pulmonary effort is normal.     Breath sounds: Normal breath sounds.  Abdominal:     General: Bowel sounds are normal.     Palpations: Abdomen is soft.  Musculoskeletal:        General: No tenderness or deformity. Normal range of motion.     Cervical back: Normal range of motion.  Lymphadenopathy:     Cervical: No cervical adenopathy.  Skin:    General: Skin is warm and dry.     Findings: No erythema or rash.  Neurological:     Mental Status: He is alert and oriented to person, place, and time.  Psychiatric:        Behavior: Behavior normal.        Thought Content: Thought content normal.        Judgment: Judgment normal.      Lab Results  Component Value Date   WBC 3.4 (L) 12/09/2019   HGB 13.4 12/09/2019   HCT 41.3 12/09/2019   MCV 99.3 12/09/2019   PLT 104 (L) 12/09/2019   Lab Results  Component Value Date   FERRITIN 738 (H) 08/18/2019   IRON 63 08/18/2019   TIBC 341 08/18/2019   UIBC 278 08/18/2019   IRONPCTSAT 18 (L) 08/18/2019   Lab Results  Component Value Date   RBC 4.16 (L) 12/09/2019   Lab Results  Component Value Date   KPAFRELGTCHN 23.3 (H) 06/20/2019   LAMBDASER 19.9 06/20/2019   KAPLAMBRATIO 1.17 06/20/2019   Lab Results  Component Value Date   IGGSERUM 1,078 06/20/2019   IGA 457 (H) 06/20/2019   IGMSERUM 80 06/20/2019   Lab Results  Component Value Date   TOTALPROTELP 6.2 06/20/2019   ALBUMINELP 3.2 06/20/2019   A1GS 0.3 06/20/2019   A2GS 0.6 06/20/2019   BETS 1.2 06/20/2019   GAMS 0.9 06/20/2019   MSPIKE Not Observed 06/20/2019     Chemistry      Component Value Date/Time   NA 139 12/09/2019 0857   NA 137 05/13/2019 1110   K 4.3 12/09/2019 0857   CL 105 12/09/2019 0857   CO2 29 12/09/2019 0857   BUN 15 12/09/2019 0857   BUN 7 (L) 05/13/2019 1110   CREATININE 0.67 12/09/2019 0857      Component Value Date/Time   CALCIUM 8.9 12/09/2019 0857    ALKPHOS 162 (H) 12/09/2019 0857   AST 100 (H) 12/09/2019 0857   ALT 34 12/09/2019 0857   BILITOT 1.6 (H) 12/09/2019 0857      Impression and Plan: Kerry Adams is a very pleasant 64 yo caucasian gentleman with metastatic hepatocellular carcinoma.  I am happy that his alpha-fetoprotein went down a little bit.  Hopefully it will continue to trend downward.  We will go ahead with treatment today.  He will get his radiation therapy later on today.  The last radiation will be on Monday.  I probably would not plan for any scans until after Thanksgiving.    We will continue to follow him up and see him back in 3 weeks.   Volanda Napoleon, MD 10/15/20219:46 AM

## 2019-12-09 NOTE — Progress Notes (Signed)
Oncology Nurse Navigator Documentation  Oncology Nurse Navigator Flowsheets 12/09/2019  Abnormal Finding Date -  Confirmed Diagnosis Date -  Diagnosis Status -  Planned Course of Treatment -  Phase of Treatment -  Chemotherapy Actual Start Date: -  Chemo/Radiation Concurrent Pending- Reason: -  Radiation Actual Start Date: -  Navigator Follow Up Date: 12/30/2019  Navigator Follow Up Reason: Follow-up Appointment;Chemotherapy  Navigator Location [facility]  Referral Date to RadOnc/MedOnc -  Navigator Encounter Type Treatment;Appt/Treatment Plan Review  Telephone -  Treatment Initiated Date -  Patient Visit Type MedOnc  Treatment Phase Active Tx  Barriers/Navigation Needs Anxiety;Mobility Issues;Morbidities/Frailty;Pain  Education -  Interventions Psycho-Social Support  Acuity Level 2-Minimal Needs (1-2 Barriers Identified)  Referrals -  Coordination of Care -  Education Method -  Support Groups/Services Friends and Family  Time Spent with Patient 15

## 2019-12-10 LAB — AFP TUMOR MARKER: AFP, Serum, Tumor Marker: 254 ng/mL — ABNORMAL HIGH (ref 0.0–8.3)

## 2019-12-10 LAB — T4: T4, Total: 6.1 ug/dL (ref 4.5–12.0)

## 2019-12-12 ENCOUNTER — Encounter: Payer: Self-pay | Admitting: *Deleted

## 2019-12-12 ENCOUNTER — Encounter: Payer: Self-pay | Admitting: Radiation Oncology

## 2019-12-12 ENCOUNTER — Ambulatory Visit
Admission: RE | Admit: 2019-12-12 | Discharge: 2019-12-12 | Disposition: A | Discharge status: Home / Self Care | Payer: Medicare Other | Source: Ambulatory Visit | Attending: Radiation Oncology | Admitting: Radiation Oncology

## 2019-12-12 DIAGNOSIS — C7801 Secondary malignant neoplasm of right lung: Secondary | ICD-10-CM | POA: Diagnosis not present

## 2019-12-13 ENCOUNTER — Encounter: Payer: Self-pay | Admitting: Cardiology

## 2019-12-13 ENCOUNTER — Ambulatory Visit (INDEPENDENT_AMBULATORY_CARE_PROVIDER_SITE_OTHER): Payer: Medicare Other | Admitting: Cardiology

## 2019-12-13 ENCOUNTER — Other Ambulatory Visit: Payer: Self-pay

## 2019-12-13 VITALS — BP 110/84 | HR 94 | Ht 73.0 in | Wt 197.0 lb

## 2019-12-13 DIAGNOSIS — I2510 Atherosclerotic heart disease of native coronary artery without angina pectoris: Secondary | ICD-10-CM

## 2019-12-13 DIAGNOSIS — I4819 Other persistent atrial fibrillation: Secondary | ICD-10-CM | POA: Diagnosis not present

## 2019-12-13 NOTE — Progress Notes (Signed)
Electrophysiology Office Note   Date:  12/13/2019   ID:  Jadd, Gasior [birth year], MRN [id number]  PCP:  Vicenta Aly, FNP  Cardiologist:  Harrington Challenger Primary Electrophysiologist:  Katharyn Schauer Meredith Leeds, MD    Chief Complaint: AF   History of Present Illness: Kerry Adams is a 64 y.o. male who is being seen today for the evaluation of AF at the request of Vicenta Aly, [place]. Presenting today for electrophysiology evaluation.  He has a history significant for alcohol abuse, coronary artery disease, cirrhosis, hypertension, hyperlipidemia, and persistent atrial fibrillation.  He drinks quite a bit of alcohol, up to 12 beers and 2 shots in a day.  He was unfortunately found to have hepatocellular carcinoma with mets to the bone, lungs, lymph nodes.  He is receiving palliative XRT and has started chemotherapy.  He was admitted to the hospital 08/25/2019 with progressive weakness, recurrent falls, and hip pain.  He was found to be in rapid atrial fibrillation.  He was treated with a diltiazem drip.  He became agitated with hallucinations that required a Precedex and was weaned to Valium for alcohol withdrawal.  He was discharged 09/02/2019.  He has since stopped drinking alcohol.  Today, denies symptoms of palpitations, chest pain, shortness of breath, orthopnea, PND, lower extremity edema, claudication, dizziness, presyncope, syncope, bleeding, or neurologic sequela. The patient is tolerating medications without difficulties.  He feels better than he has felt in the last few days to weeks.  He has no chest pain and no shortness of breath.  When he was admitted to the hospital, he underwent a full detox and has not had a drink in the last 15 weeks.  He feels much better and no longer has the shakes.  He is unaware of rapid heart rates.   Past Medical History:  Diagnosis Date  . Abnormal EKG   . Alcohol use 02/18/2014  . Arthritis    osteo knees and hips   . Atrial fibrillation ([place])   .  CAD (coronary artery disease)    WITHOUT ANGINA PECTORIS, UNSPECIFIED VESSEL OR LESION TYPE, UNSPECIFIED WHETHER NATIVE OR TRANSPLANTED HEART  . 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 Chronic liver disease   . Cirrhosis ([place])   . Closed right hip fracture ([place]) 02/18/2014  . Dizziness   . Dysrhythmia   . Elevated liver function tests   . GERD (gastroesophageal reflux disease)    treats with OTC Ranitidine  . Goals of care, counseling/discussion 06/20/2019  . Hepatocellular carcinoma metastatic to lung ([place]) 07/08/2019  . Hepatocellular carcinoma metastatic to lymph nodes of multiple sites ([place]) 07/08/2019  . Hip fracture requiring operative repair ([place]) 02/19/2014  . Hyperlipidemia   . Hypertension   . Metastasis to right adrenal gland of unknown origin ([place]) 06/20/2019  . Metastatic sarcoma to liver ([place]) 06/20/2019  . 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 Osteoarthritis of knee, unilateral 03/01/2015  . Primary osteoarthritis of right knee 05/23/2015  . Thrombocytopenia ([place])    Past Surgical History:  Procedure Laterality Date  . CARDIOVERSION N/A 09/13/2018   Procedure: CARDIOVERSION;  Surgeon: Acie Fredrickson Wonda Cheng, MD;  Location: [facility] ENDOSCOPY;  Service: Cardiovascular;  Laterality: N/A;  . FRACTURE SURGERY     right femur  . HERNIA REPAIR     umbilical  . INTERTROCHANTERIC HIP FRACTURE SURGERY Right 02/19/2014  . INTRAMEDULLARY (IM) NAIL INTERTROCHANTERIC Right 02/19/2014   Procedure: INTRAMEDULLARY (IM) NAIL INTERTROCHANTRIC;  Surgeon: [name], MD;  Location:  [facility] OR;  Service: Orthopedics;  Laterality: Right;  . SHOULDER SURGERY Right   . TOTAL KNEE ARTHROPLASTY Left 03/01/2015   Procedure: TOTAL KNEE ARTHROPLASTY;  Surgeon: [name], MD;  Location: [facility];  Service: Orthopedics;  Laterality: Left;  Left knee arthroplasty  . TOTAL KNEE ARTHROPLASTY Right 05/23/2015   Procedure: TOTAL KNEE ARTHROPLASTY W/HARDWARE REMOVAL FEMUR;  Surgeon:  Frederik Pear, MD;  Location: [facility];  Service: Orthopedics;  Laterality: Right;     Current Outpatient Medications  Medication Sig Dispense Refill  . acetaminophen (TYLENOL) 500 MG tablet Take 500 mg by mouth every 8 (eight) hours as needed (pain).     Marland Kitchen amoxicillin (AMOXIL) 500 MG capsule Prior to dental appointments    . diazepam (VALIUM) 2 MG tablet Take 1 tablet (2 mg total) by mouth every 12 (twelve) hours as needed for anxiety. 14 tablet 0  . famotidine (PEPCID) 10 MG tablet Take 10 mg by mouth every morning.     . metoprolol tartrate (LOPRESSOR) 100 MG tablet Take 1 tablet (100 mg total) by mouth 2 (two) times daily. 180 tablet 3  . Multiple Vitamin (MULTIVITAMIN WITH MINERALS) TABS tablet Take 1 tablet by mouth daily.    . ondansetron (ZOFRAN) 8 MG tablet Take by mouth every 8 (eight) hours as needed.     Marland Kitchen oxycodone (OXY-IR) 5 MG capsule Take 5 mg by mouth 4 (four) times daily as needed.     . polyethylene glycol powder (GLYCOLAX/MIRALAX) 17 GM/SCOOP powder Take 1 Container by mouth daily as needed.    . Potassium 99 MG TABS Take 99 mg by mouth daily.    . prochlorperazine (COMPAZINE) 10 MG tablet Take 10 mg by mouth every 6 (six) hours as needed for nausea or vomiting.    . rivaroxaban (XARELTO) 20 MG TABS tablet Take 1 tablet (20 mg total) by mouth daily with supper. 30 tablet 5  . traMADol (ULTRAM) 50 MG tablet TAKE 1 TABLET BY MOUTH EVERY 6 HOURS AS NEEDED 60 tablet 0  . trolamine salicylate (ASPERCREME) 10 % cream Apply 1 application topically as needed for muscle pain.     No current facility-administered medications for this visit.    Allergies:   Patient has no known allergies.   Social History:  The patient  reports that he has never smoked. He has never used smokeless tobacco. He reports current alcohol use of about 32.0 standard drinks of alcohol per week. He reports that he does not use drugs.   Family History:  The patient's family history includes Healthy in his  brother; Hypertension in his mother; Stroke in his father.   ROS:  Please see the history of present illness.   Otherwise, review of systems is positive for none.   All other systems are reviewed and negative.   PHYSICAL EXAM: VS:  BP 110/84   Pulse 94   Ht 6\' 1"  (1.854 m)   Wt 197 lb (89.4 kg)   SpO2 99%   BMI 25.99 kg/m  , BMI Body mass index is 25.99 kg/m. GEN: Well nourished, well developed, in no acute distress  HEENT: normal  Neck: no JVD, carotid bruits, or masses Cardiac: irregular; no murmurs, rubs, or gallops,no edema  Respiratory:  clear to auscultation bilaterally, normal work of breathing GI: soft, nontender, nondistended, + BS MS: no deformity or atrophy  Skin: warm and dry Neuro:  Strength and sensation are intact Psych: euthymic mood, full affect  EKG:  EKG is ordered today. Personal  review of the ekg ordered shows atrial fibrillation, rate 94  Recent Labs: 08/25/2019: B Natriuretic Peptide 633.0 08/30/2019: Magnesium 1.8 11/18/2019: TSH 2.135 12/09/2019: ALT 34; BUN 15; Creatinine 0.67; Hemoglobin 13.4; Platelet Count 104; Potassium 4.3; Sodium 139    Lipid Panel     Component Value Date/Time   CHOL 223 (H) 07/14/2018 0849   TRIG 64 07/14/2018 0849   HDL 100 07/14/2018 0849   CHOLHDL 2.2 07/14/2018 0849   CHOLHDL 1 07/22/2012 0759   VLDL 7.2 07/22/2012 0759   LDLCALC 110 (H) 07/14/2018 0849   LDLDIRECT 58.5 07/22/2012 0759     Wt Readings from Last 3 Encounters:  12/13/19 197 lb (89.4 kg)  12/09/19 197 lb (89.4 kg)  11/23/19 192 lb 9.6 oz (87.4 kg)      Other studies Reviewed: Additional studies/ records that were reviewed today include: TTE 07/20/18  Review of the above records today demonstrates:  1. The left ventricle has normal systolic function with an ejection  fraction of 60-65%. The cavity size was normal. There is mildly increased  left ventricular wall thickness. Left ventricular diastolic Doppler  parameters are indeterminate  secondary to  atrial fibrillation.  2. The right ventricle has normal systolic function. The cavity was  normal. There is no increase in right ventricular wall thickness.  3. Left atrial size was severely dilated.  4. Right atrial size was severely dilated.  5. There is mild mitral annular calcification present.  6. The aortic valve is tricuspid. Aortic valve regurgitation was not  assessed by color flow Doppler.   Monitor 04/26/19 Atrial fibrillation   92 to 216 bpm  Average HR 130 bpm Occasional PVCs and NSVT  Longest spell 18 beats at 170 bpm   ASSESSMENT AND PLAN:  1.  Persistent atrial fibrillation: Wore a cardiac monitor that showed rapid rates.  Currently on metoprolol and Xarelto.  CHA2DS2-VASc of 2.  His heart rates are better controlled on a higher dose of was increased while he was in the hospital.  We Kayal Mula continue with current management.  He is minimally symptomatic and we Gaspard Isbell plan for a rate control strategy at this point.  If his liver issues become better defined with a better prognosis, rhythm control may be a beneficial answer, but we Markese Bloxham continue with the current management.  2.  Hypertension: Currently well controlled  3.  Alcohol abuse: Cessation encouraged.  He has been sober for 15 weeks.  4.  Hepatocellular carcinoma, metastatic: Has been receiving chemo and radiation.  Current medicines are reviewed at length with the patient today.   The patient does not have concerns regarding his medicines.  The following changes were made today: none  Labs/ tests ordered today include:  Orders Placed This Encounter  Procedures  . EKG 12-Lead     Disposition:   FU with Nakesha Ebrahim 12 months   Signed, Deshawn Witty Meredith Leeds, MD  12/13/2019 9:15 AM     [facility] [address] [place] [address] [phone] (office) [phone] (fax)

## 2019-12-13 NOTE — Patient Instructions (Signed)
Medication Instructions:  Your physician recommends that you continue on your current medications as directed. Please refer to the Current Medication list given to you today.  *If you need a refill on your cardiac medications before your next appointment, please call your pharmacy*   Lab Work: None ordered   Testing/Procedures: None ordered   Follow-Up: At [facility], you and your health needs are our priority.  As part of our continuing mission to provide you with exceptional heart care, we have created designated Provider Care Teams.  These Care Teams include your primary Cardiologist (physician) and Advanced Practice Providers (APPs -  Physician Assistants and Nurse Practitioners) who all work together to provide you with the care you need, when you need it.  Your next appointment:   3 month(s)  The format for your next appointment:   In Person  Provider:   Tommye Standard, PA-C   Your physician wants you to follow-up in: 1 year with Dr. Curt Bears. You will receive a reminder letter in the mail two months in advance. If you don't receive a letter, please call our office to schedule the follow-up appointment.   Thank you for choosing [facility]!!   Trinidad Curet, RN [phone]

## 2019-12-26 ENCOUNTER — Other Ambulatory Visit: Payer: Self-pay | Admitting: Hematology & Oncology

## 2019-12-26 DIAGNOSIS — C7951 Secondary malignant neoplasm of bone: Secondary | ICD-10-CM

## 2019-12-26 DIAGNOSIS — C7800 Secondary malignant neoplasm of unspecified lung: Secondary | ICD-10-CM

## 2019-12-26 DIAGNOSIS — C7801 Secondary malignant neoplasm of right lung: Secondary | ICD-10-CM

## 2019-12-30 ENCOUNTER — Inpatient Hospital Stay: Payer: Medicare Other | Attending: Hematology & Oncology | Admitting: Hematology & Oncology

## 2019-12-30 ENCOUNTER — Inpatient Hospital Stay: Payer: Medicare Other

## 2019-12-30 ENCOUNTER — Encounter: Payer: Self-pay | Admitting: Hematology & Oncology

## 2019-12-30 ENCOUNTER — Encounter: Payer: Self-pay | Admitting: *Deleted

## 2019-12-30 ENCOUNTER — Other Ambulatory Visit: Payer: Self-pay

## 2019-12-30 VITALS — BP 87/66 | HR 97 | Temp 98.2°F | Resp 19 | Wt 205.0 lb

## 2019-12-30 VITALS — BP 114/74 | HR 91 | Resp 17

## 2019-12-30 DIAGNOSIS — C220 Liver cell carcinoma: Secondary | ICD-10-CM

## 2019-12-30 DIAGNOSIS — I2510 Atherosclerotic heart disease of native coronary artery without angina pectoris: Secondary | ICD-10-CM | POA: Diagnosis not present

## 2019-12-30 DIAGNOSIS — C7800 Secondary malignant neoplasm of unspecified lung: Secondary | ICD-10-CM

## 2019-12-30 DIAGNOSIS — C7951 Secondary malignant neoplasm of bone: Secondary | ICD-10-CM | POA: Diagnosis not present

## 2019-12-30 DIAGNOSIS — Z79899 Other long term (current) drug therapy: Secondary | ICD-10-CM | POA: Insufficient documentation

## 2019-12-30 DIAGNOSIS — Z5112 Encounter for antineoplastic immunotherapy: Secondary | ICD-10-CM | POA: Diagnosis present

## 2019-12-30 DIAGNOSIS — Z923 Personal history of irradiation: Secondary | ICD-10-CM | POA: Diagnosis not present

## 2019-12-30 LAB — CBC WITH DIFFERENTIAL (CANCER CENTER ONLY)
Abs Immature Granulocytes: 0.02 10*3/uL (ref 0.00–0.07)
Basophils Absolute: 0.1 10*3/uL (ref 0.0–0.1)
Basophils Relative: 1 %
Eosinophils Absolute: 0.1 10*3/uL (ref 0.0–0.5)
Eosinophils Relative: 2 %
HCT: 33.2 % — ABNORMAL LOW (ref 39.0–52.0)
Hemoglobin: 10.8 g/dL — ABNORMAL LOW (ref 13.0–17.0)
Immature Granulocytes: 0 %
Lymphocytes Relative: 12 %
Lymphs Abs: 0.7 10*3/uL (ref 0.7–4.0)
MCH: 33.5 pg (ref 26.0–34.0)
MCHC: 32.5 g/dL (ref 30.0–36.0)
MCV: 103.1 fL — ABNORMAL HIGH (ref 80.0–100.0)
Monocytes Absolute: 0.8 10*3/uL (ref 0.1–1.0)
Monocytes Relative: 16 %
Neutro Abs: 3.7 10*3/uL (ref 1.7–7.7)
Neutrophils Relative %: 69 %
Platelet Count: 118 10*3/uL — ABNORMAL LOW (ref 150–400)
RBC: 3.22 MIL/uL — ABNORMAL LOW (ref 4.22–5.81)
RDW: 19 % — ABNORMAL HIGH (ref 11.5–15.5)
WBC Count: 5.4 10*3/uL (ref 4.0–10.5)
nRBC: 0 % (ref 0.0–0.2)

## 2019-12-30 LAB — CMP (CANCER CENTER ONLY)
ALT: 37 U/L (ref 0–44)
AST: 99 U/L — ABNORMAL HIGH (ref 15–41)
Albumin: 2.7 g/dL — ABNORMAL LOW (ref 3.5–5.0)
Alkaline Phosphatase: 176 U/L — ABNORMAL HIGH (ref 38–126)
Anion gap: 7 (ref 5–15)
BUN: 10 mg/dL (ref 8–23)
CO2: 27 mmol/L (ref 22–32)
Calcium: 8.9 mg/dL (ref 8.9–10.3)
Chloride: 101 mmol/L (ref 98–111)
Creatinine: 0.64 mg/dL (ref 0.61–1.24)
GFR, Estimated: >60 mL/min (ref >60)
Glucose, Bld: 116 mg/dL — ABNORMAL HIGH (ref 70–99)
Potassium: 4.3 mmol/L (ref 3.5–5.1)
Sodium: 135 mmol/L (ref 135–145)
Total Bilirubin: 1.4 mg/dL — ABNORMAL HIGH (ref 0.3–1.2)
Total Protein: 5.8 g/dL — ABNORMAL LOW (ref 6.5–8.1)

## 2019-12-30 LAB — LACTATE DEHYDROGENASE: LDH: 177 U/L (ref 98–192)

## 2019-12-30 LAB — TOTAL PROTEIN, URINE DIPSTICK: Protein, ur: 30 mg/dL — AB

## 2019-12-30 MED ORDER — SODIUM CHLORIDE 0.9 % IV SOLN
Freq: Once | INTRAVENOUS | Status: AC
  Filled 2019-12-30: qty 250

## 2019-12-30 MED ORDER — SODIUM CHLORIDE 0.9 % IV SOLN
15.0000 mg/kg | Freq: Once | INTRAVENOUS | Status: AC
  Administered 2019-12-30: 1300 mg via INTRAVENOUS
  Filled 2019-12-30: qty 48

## 2019-12-30 MED ORDER — SODIUM CHLORIDE 0.9 % IV SOLN
1200.0000 mg | Freq: Once | INTRAVENOUS | Status: AC
  Administered 2019-12-30: 1200 mg via INTRAVENOUS
  Filled 2019-12-30: qty 20

## 2019-12-30 NOTE — Progress Notes (Signed)
Hematology and Oncology Follow Up Visit  Kerry Adams [id number] March 04, 1955 64 y.o. 12/30/2019   Principle Diagnosis:  Hepatocellular carcinoma -- mets to bone, lung, lymph nodes  Current Therapy:        Palliative Radiation to the RIGHT femur/ chest wall Tecentriq/Avastin -- started 08/05/2019, s/p cycle #7 Xgeva 120 mg sq q 3 months -- next dose in 01/2020   Interim History:  Kerry Adams is here today with his wife.  He really looks quite good.  He and his wife have been going to the mountains recently to look at the leaf colors.  They have enjoyed this.  He did finish his radiation therapy to the chest wall lesion.  This finished up I think a couple weeks ago.  He actually tolerated this quite well.  He really does not have much in the way of pain in the right chest wall.  There is been no problems with hip pain.  He takes tramadol and this seems to work.  His alpha-fetoprotein has come down nicely.  We last saw him, the alpha fetoprotein was down to 255.  He has had no problems with bowels or bladder.  There is no bleeding.  His hemoglobin is down a little bit today.  Has not noticed any melena or hematochezia.  There is been no rashes.  He is on blood thinner.  He does bruise quite easily.  Overall, his performance status is ECOG 1.    Medications:  Allergies as of 12/30/2019   No Known Allergies     Medication List       Accurate as of December 30, 2019  9:53 AM. If you have any questions, ask your nurse or doctor.        acetaminophen 500 MG tablet Commonly known as: TYLENOL Take 500 mg by mouth every 8 (eight) hours as needed (pain).   amoxicillin 500 MG capsule Commonly known as: AMOXIL Prior to dental appointments   diazepam 2 MG tablet Commonly known as: VALIUM Take 1 tablet (2 mg total) by mouth every 12 (twelve) hours as needed for anxiety.   famotidine 10 MG tablet Commonly known as: PEPCID Take 10 mg by mouth every morning.   metoprolol  tartrate 100 MG tablet Commonly known as: LOPRESSOR Take 1 tablet (100 mg total) by mouth 2 (two) times daily.   multivitamin with minerals Tabs tablet Take 1 tablet by mouth daily.   ondansetron 8 MG tablet Commonly known as: ZOFRAN Take by mouth every 8 (eight) hours as needed.   oxycodone 5 MG capsule Commonly known as: OXY-IR Take 5 mg by mouth 4 (four) times daily as needed.   polyethylene glycol powder 17 GM/SCOOP powder Commonly known as: GLYCOLAX/MIRALAX Take 1 Container by mouth daily as needed.   Potassium 99 MG Tabs Take 99 mg by mouth daily.   prochlorperazine 10 MG tablet Commonly known as: COMPAZINE Take 10 mg by mouth every 6 (six) hours as needed for nausea or vomiting.   rivaroxaban 20 MG Tabs tablet Commonly known as: Xarelto Take 1 tablet (20 mg total) by mouth daily with supper.   traMADol 50 MG tablet Commonly known as: ULTRAM TAKE 1 TABLET BY MOUTH EVERY 6 HOURS AS NEEDED   trolamine salicylate 10 % cream Commonly known as: ASPERCREME Apply 1 application topically as needed for muscle pain.       Allergies: No Known Allergies  Past Medical History, Surgical history, Social history, and Family History were reviewed and updated.  Review of Systems: Review of Systems  Constitutional: Negative.   HENT: Negative.   Eyes: Negative.   Respiratory: Negative.   Cardiovascular: Negative.   Gastrointestinal: Negative.   Genitourinary: Negative.   Musculoskeletal: Positive for back pain, falls and joint pain.  Skin: Negative.   Neurological: Positive for focal weakness.  Endo/Heme/Allergies: Negative.   Psychiatric/Behavioral: Negative.      Physical Exam:  weight is 205 lb (93 kg). His oral temperature is 98.2 F (36.8 C). His blood pressure is 87/66 (abnormal) and his pulse is 97. His respiration is 19 and oxygen saturation is 100%.   Wt Readings from Last 3 Encounters:  12/30/19 205 lb (93 kg)  12/13/19 197 lb (89.4 kg)  12/09/19 197  lb (89.4 kg)    Physical Exam Vitals reviewed.  HENT:     Head: Normocephalic and atraumatic.  Eyes:     Pupils: Pupils are equal, round, and reactive to light.  Cardiovascular:     Rate and Rhythm: Normal rate and regular rhythm.     Heart sounds: Normal heart sounds.  Pulmonary:     Effort: Pulmonary effort is normal.     Breath sounds: Normal breath sounds.  Abdominal:     General: Bowel sounds are normal.     Palpations: Abdomen is soft.  Musculoskeletal:        General: No tenderness or deformity. Normal range of motion.     Cervical back: Normal range of motion.  Lymphadenopathy:     Cervical: No cervical adenopathy.  Skin:    General: Skin is warm and dry.     Findings: No erythema or rash.  Neurological:     Mental Status: He is alert and oriented to person, place, and time.  Psychiatric:        Behavior: Behavior normal.        Thought Content: Thought content normal.        Judgment: Judgment normal.      Lab Results  Component Value Date   WBC 5.4 12/30/2019   HGB 10.8 (L) 12/30/2019   HCT 33.2 (L) 12/30/2019   MCV 103.1 (H) 12/30/2019   PLT 118 (L) 12/30/2019   Lab Results  Component Value Date   FERRITIN 738 (H) 08/18/2019   IRON 63 08/18/2019   TIBC 341 08/18/2019   UIBC 278 08/18/2019   IRONPCTSAT 18 (L) 08/18/2019   Lab Results  Component Value Date   RBC 3.22 (L) 12/30/2019   Lab Results  Component Value Date   KPAFRELGTCHN 23.3 (H) 06/20/2019   LAMBDASER 19.9 06/20/2019   KAPLAMBRATIO 1.17 06/20/2019   Lab Results  Component Value Date   IGGSERUM 1,078 06/20/2019   IGA 457 (H) 06/20/2019   IGMSERUM 80 06/20/2019   Lab Results  Component Value Date   TOTALPROTELP 6.2 06/20/2019   ALBUMINELP 3.2 06/20/2019   A1GS 0.3 06/20/2019   A2GS 0.6 06/20/2019   BETS 1.2 06/20/2019   GAMS 0.9 06/20/2019   MSPIKE Not Observed 06/20/2019     Chemistry      Component Value Date/Time   NA 135 12/30/2019 0857   NA 137 05/13/2019 1110     K 4.3 12/30/2019 0857   CL 101 12/30/2019 0857   CO2 27 12/30/2019 0857   BUN 10 12/30/2019 0857   BUN 7 (L) 05/13/2019 1110   CREATININE 0.64 12/30/2019 0857      Component Value Date/Time   CALCIUM 8.9 12/30/2019 0857   ALKPHOS 176 (H) 12/30/2019 0857  AST 99 (H) 12/30/2019 0857   ALT 37 12/30/2019 0857   BILITOT 1.4 (H) 12/30/2019 0857      Impression and Plan: Mr. Keilman is a very pleasant 64 yo caucasian gentleman with metastatic hepatocellular carcinoma.   I am happy that his quality of life is doing so well right now.  He really has tolerated treatment quite nicely.  I will have to watch his hemoglobin closely.  Again it is down from what it typically has been.  We will make sure we check iron studies more see him back.  Right now, we will go ahead and give him his treatment today.  This will be his eighth cycle.  We will scan him after his ninth cycle.  We will plan to get him back the week after Thanksgiving for his next treatment.   Volanda Napoleon, MD 11/5/20219:53 AM

## 2019-12-30 NOTE — Patient Instructions (Signed)
[facility] Discharge Instructions for Patients Receiving Chemotherapy  Today you received the following chemotherapy agents Tecentriq and Becacizumab  To help prevent nausea and vomiting after your treatment, we encourage you to take your nausea medication as prescribed by MD.   If you develop nausea and vomiting that is not controlled by your nausea medication, call the clinic.   BELOW ARE SYMPTOMS THAT SHOULD BE REPORTED IMMEDIATELY:  *FEVER GREATER THAN 100.5 F  *CHILLS WITH OR WITHOUT FEVER  NAUSEA AND VOMITING THAT IS NOT CONTROLLED WITH YOUR NAUSEA MEDICATION  *UNUSUAL SHORTNESS OF BREATH  *UNUSUAL BRUISING OR BLEEDING  TENDERNESS IN MOUTH AND THROAT WITH OR WITHOUT PRESENCE OF ULCERS  *URINARY PROBLEMS  *BOWEL PROBLEMS  UNUSUAL RASH Items with * indicate a potential emergency and should be followed up as soon as possible.  Feel free to call the clinic should you have any questions or concerns. The clinic phone number is [phone].  Please show the [facility] at check-in to the Emergency Department and triage nurse.

## 2019-12-30 NOTE — Progress Notes (Signed)
Reviewed pt vitals with Dr. Marin Olp and ok to proceed with current blood pressure.  1240: Pt discharged in no apparent distress. Pt left ambulatory without assistance.  Pt aware of discharge instructions and verbalized understanding and had no further questions.

## 2019-12-31 LAB — T4: T4, Total: 5.3 ug/dL (ref 4.5–12.0)

## 2019-12-31 LAB — AFP TUMOR MARKER: AFP, Serum, Tumor Marker: 101 ng/mL — ABNORMAL HIGH (ref 0.0–8.3)

## 2020-01-02 ENCOUNTER — Telehealth: Payer: Self-pay | Admitting: Hematology & Oncology

## 2020-01-02 NOTE — Telephone Encounter (Signed)
Appointments scheduled calendar printed & mailed per 11/5 los 

## 2020-01-03 NOTE — Progress Notes (Signed)
Oncology Nurse Navigator Documentation  Oncology Nurse Navigator Flowsheets 12/30/2019  Abnormal Finding Date -  Confirmed Diagnosis Date -  Diagnosis Status -  Planned Course of Treatment -  Phase of Treatment -  Chemotherapy Actual Start Date: -  Chemo/Radiation Concurrent Pending- Reason: -  Radiation Actual Start Date: -  Navigator Follow Up Date: 01/27/2020  Navigator Follow Up Reason: Follow-up Appointment;Chemotherapy  Navigator Location [facility]  Referral Date to RadOnc/MedOnc -  Navigator Encounter Type Treatment;Appt/Treatment Plan Review  Telephone -  Treatment Initiated Date -  Patient Visit Type MedOnc  Treatment Phase Active Tx  Barriers/Navigation Needs Anxiety;Mobility Issues;Morbidities/Frailty;Pain  Education -  Interventions Psycho-Social Support  Acuity Level 2-Minimal Needs (1-2 Barriers Identified)  Referrals -  Coordination of Care -  Education Method -  Support Groups/Services Friends and Family  Time Spent with Patient 69

## 2020-01-15 NOTE — Progress Notes (Incomplete)
  Patient Name: Kerry Adams MRN: [id number] DOB: 26-Apr-1955 Referring Physician: Burney Gauze (Profile Not Attached) Date of Service: 12/12/2019 [facility], [place]                                                        End Of Treatment Note  Diagnoses: C78.01-Secondary malignant neoplasm of right lung  Cancer Staging: Metastatic hepatocellular carcinoma with large right chest/ wall lung metastasis  Intent: Palliative  Radiation Treatment Dates: 11/29/2019 through 12/12/2019 Site Technique Total Dose (Gy) Dose per Fx (Gy) Completed Fx Beam Energies  Chest, Right: Chest_Rt 3D 30/30 3 10/10 6X, 10X   Narrative: The patient tolerated radiation therapy relatively well. He did report some right shoulder pain that improved, fatigue, an occasionally productive cough, and occasional reflux managed with TUMS. He had stated that his right hip pain had improved, his appetite had improved, his mobility had improved, and his sense of taste had returned. He denied hemoptysis, difficulty swallowing, and shortness of breath. During treatment, there was noted to be some slight erythema to the skin of the radiation portals in the right upper chest region.  Plan: The patient will follow-up with radiation oncology in one month.  ________________________________________________   Blair Promise, PhD, MD  This document serves as a record of services personally performed by Gery Pray, MD. It was created on his behalf by Clerance Lav, a trained medical scribe. The creation of this record is based on the scribe's personal observations and the provider's statements to them. This document has been checked and approved by the attending provider.

## 2020-01-15 NOTE — Progress Notes (Signed)
Radiation Oncology         [phone] ________________________________  Name: Kerry Adams MRN: [id number]  Date: 01/16/2020  DOB: 07-Feb-1956  Follow-Up Visit Note  CC: Vicenta Aly, FNP  Volanda Napoleon, MD    ICD-10-CM   1. Hepatocellular carcinoma metastatic to lung, unspecified laterality (HCC)  C78.00    C22.0     Diagnosis: Metastatic hepatocellular carcinoma with large right chest/wall lung metastasis  Interval Since Last Radiation: One month and four days  Radiation Treatment Dates: 11/29/2019 through 12/12/2019 Site Technique Total Dose (Gy) Dose per Fx (Gy) Completed Fx Beam Energies  Chest, Right: Chest_Rt 3D 30/30 3 10/10 6X, 10X    Narrative:  The patient returns today for routine follow-up. He continues on chemotherapy with Tecentriq and Avastin, status post 8 cycles, under the care of Dr. Marin Olp. Next dose of Delton See is anticipated to be next month.                    On review of systems, he reports improving right shoulder pain. He denies all complaints today.  Right shoulder mobility is also improved with his palliative radiation therapy.  ALLERGIES:  has No Known Allergies.  Meds: Current Outpatient Medications  Medication Sig Dispense Refill  . acetaminophen (TYLENOL) 500 MG tablet Take 500 mg by mouth every 8 (eight) hours as needed (pain).     Marland Kitchen amoxicillin (AMOXIL) 500 MG capsule Prior to dental appointments    . diazepam (VALIUM) 2 MG tablet Take 1 tablet (2 mg total) by mouth every 12 (twelve) hours as needed for anxiety. 14 tablet 0  . famotidine (PEPCID) 10 MG tablet Take 10 mg by mouth every morning.     . metoprolol tartrate (LOPRESSOR) 100 MG tablet Take 1 tablet (100 mg total) by mouth 2 (two) times daily. 180 tablet 3  . Multiple Vitamin (MULTIVITAMIN WITH MINERALS) TABS tablet Take 1 tablet by mouth daily.    . ondansetron (ZOFRAN) 8 MG tablet Take by mouth every 8 (eight) hours as needed.     Marland Kitchen oxycodone (OXY-IR) 5 MG capsule Take  5 mg by mouth 4 (four) times daily as needed.     . polyethylene glycol powder (GLYCOLAX/MIRALAX) 17 GM/SCOOP powder Take 1 Container by mouth daily as needed.    . Potassium 99 MG TABS Take 99 mg by mouth daily.    . prochlorperazine (COMPAZINE) 10 MG tablet Take 10 mg by mouth every 6 (six) hours as needed for nausea or vomiting.    . rivaroxaban (XARELTO) 20 MG TABS tablet Take 1 tablet (20 mg total) by mouth daily with supper. 30 tablet 5  . traMADol (ULTRAM) 50 MG tablet TAKE 1 TABLET BY MOUTH EVERY 6 HOURS AS NEEDED 60 tablet 0  . trolamine salicylate (ASPERCREME) 10 % cream Apply 1 application topically as needed for muscle pain.     No current facility-administered medications for this encounter.    Physical Findings: The patient is in no acute distress. Patient is alert and oriented.  height is 6\' 1"  (1.854 m) and weight is 197 lb (89.4 kg). His temporal temperature is 97.4 F (36.3 C) (abnormal). His blood pressure is 114/85 and his pulse is 96. His respiration is 18 and oxygen saturation is 100%. No significant changes. Lungs are clear to auscultation bilaterally. Heart has regular rate and rhythm. No palpable cervical, supraclavicular, or axillary adenopathy. Abdomen soft, non-tender, normal bowel sounds.  No significant skin reaction in the right shoulder  and upper chest region.  Right shoulder mobility is much improved.  Lab Findings: Lab Results  Component Value Date   WBC 5.4 12/30/2019   HGB 10.8 (L) 12/30/2019   HCT 33.2 (L) 12/30/2019   MCV 103.1 (H) 12/30/2019   PLT 118 (L) 12/30/2019    Radiographic Findings: No results found.  Impression:  Metastatic hepatocellular carcinoma with large right chest/wall lung metastasis  The patient received good palliative benefit from his radiation therapy in terms of right shoulder mobility and pain  Plan: The patient is scheduled to follow up with Dr. Marin Olp on 01/27/2020. He will follow up with radiation oncology on an as  needed basis in light of his close follow-up in medical oncology.Blair Promise, PhD, MD  This document serves as a record of services personally performed by Gery Pray, MD. It was created on his behalf by Clerance Lav, a trained medical scribe. The creation of this record is based on the scribe's personal observations and the provider's statements to them. This document has been checked and approved by the attending provider.

## 2020-01-16 ENCOUNTER — Ambulatory Visit
Admission: RE | Admit: 2020-01-16 | Discharge: 2020-01-16 | Disposition: A | Discharge status: Home / Self Care | Payer: Medicare Other | Source: Ambulatory Visit | Attending: Radiation Oncology | Admitting: Radiation Oncology

## 2020-01-16 ENCOUNTER — Other Ambulatory Visit: Payer: Self-pay

## 2020-01-16 ENCOUNTER — Encounter: Payer: Self-pay | Admitting: Radiation Oncology

## 2020-01-16 DIAGNOSIS — Z923 Personal history of irradiation: Secondary | ICD-10-CM | POA: Insufficient documentation

## 2020-01-16 DIAGNOSIS — Z7901 Long term (current) use of anticoagulants: Secondary | ICD-10-CM | POA: Insufficient documentation

## 2020-01-16 DIAGNOSIS — C7801 Secondary malignant neoplasm of right lung: Secondary | ICD-10-CM | POA: Diagnosis present

## 2020-01-16 DIAGNOSIS — C220 Liver cell carcinoma: Secondary | ICD-10-CM | POA: Diagnosis not present

## 2020-01-16 DIAGNOSIS — C7800 Secondary malignant neoplasm of unspecified lung: Secondary | ICD-10-CM

## 2020-01-16 DIAGNOSIS — Z79899 Other long term (current) drug therapy: Secondary | ICD-10-CM | POA: Insufficient documentation

## 2020-01-16 NOTE — Progress Notes (Signed)
Patient here for a 1 month f/u visit with Dr. Sondra Come. He reports the pain in his right shoulder is better since  He had his radiation treatments. He denies any problems today.   BP 114/85 (BP Location: Left Arm, Patient Position: Sitting)   Pulse 96   Temp (!) 97.4 F (36.3 C) (Temporal)   Resp 18   Ht 6\' 1"  (1.854 m)   Wt 197 lb (89.4 kg)   SpO2 100%   BMI 25.99 kg/m   Wt Readings from Last 3 Encounters:  01/16/20 197 lb (89.4 kg)  12/30/19 205 lb (93 kg)  12/13/19 197 lb (89.4 kg)

## 2020-01-27 ENCOUNTER — Encounter: Payer: Self-pay | Admitting: Hematology & Oncology

## 2020-01-27 ENCOUNTER — Inpatient Hospital Stay: Payer: Medicare Other | Attending: Hematology & Oncology

## 2020-01-27 ENCOUNTER — Inpatient Hospital Stay (HOSPITAL_BASED_OUTPATIENT_CLINIC_OR_DEPARTMENT_OTHER): Payer: Medicare Other | Admitting: Hematology & Oncology

## 2020-01-27 ENCOUNTER — Inpatient Hospital Stay: Payer: Medicare Other

## 2020-01-27 ENCOUNTER — Other Ambulatory Visit: Payer: Self-pay

## 2020-01-27 ENCOUNTER — Encounter: Payer: Self-pay | Admitting: *Deleted

## 2020-01-27 VITALS — BP 114/79 | HR 93 | Temp 97.4°F | Resp 20 | Wt 195.0 lb

## 2020-01-27 DIAGNOSIS — C220 Liver cell carcinoma: Secondary | ICD-10-CM | POA: Diagnosis not present

## 2020-01-27 DIAGNOSIS — I2510 Atherosclerotic heart disease of native coronary artery without angina pectoris: Secondary | ICD-10-CM | POA: Diagnosis not present

## 2020-01-27 DIAGNOSIS — Z5112 Encounter for antineoplastic immunotherapy: Secondary | ICD-10-CM | POA: Insufficient documentation

## 2020-01-27 DIAGNOSIS — M549 Dorsalgia, unspecified: Secondary | ICD-10-CM | POA: Diagnosis not present

## 2020-01-27 DIAGNOSIS — D509 Iron deficiency anemia, unspecified: Secondary | ICD-10-CM | POA: Insufficient documentation

## 2020-01-27 DIAGNOSIS — C7800 Secondary malignant neoplasm of unspecified lung: Secondary | ICD-10-CM | POA: Diagnosis not present

## 2020-01-27 DIAGNOSIS — C7951 Secondary malignant neoplasm of bone: Secondary | ICD-10-CM | POA: Diagnosis not present

## 2020-01-27 DIAGNOSIS — Z79899 Other long term (current) drug therapy: Secondary | ICD-10-CM | POA: Insufficient documentation

## 2020-01-27 DIAGNOSIS — C779 Secondary and unspecified malignant neoplasm of lymph node, unspecified: Secondary | ICD-10-CM | POA: Diagnosis not present

## 2020-01-27 DIAGNOSIS — R600 Localized edema: Secondary | ICD-10-CM | POA: Diagnosis not present

## 2020-01-27 DIAGNOSIS — R531 Weakness: Secondary | ICD-10-CM | POA: Diagnosis not present

## 2020-01-27 LAB — CBC WITH DIFFERENTIAL (CANCER CENTER ONLY)
Abs Immature Granulocytes: 0.01 10*3/uL (ref 0.00–0.07)
Basophils Absolute: 0 10*3/uL (ref 0.0–0.1)
Basophils Relative: 1 %
Eosinophils Absolute: 0.1 10*3/uL (ref 0.0–0.5)
Eosinophils Relative: 2 %
HCT: 36.8 % — ABNORMAL LOW (ref 39.0–52.0)
Hemoglobin: 11.5 g/dL — ABNORMAL LOW (ref 13.0–17.0)
Immature Granulocytes: 0 %
Lymphocytes Relative: 12 %
Lymphs Abs: 0.5 10*3/uL — ABNORMAL LOW (ref 0.7–4.0)
MCH: 31.9 pg (ref 26.0–34.0)
MCHC: 31.3 g/dL (ref 30.0–36.0)
MCV: 101.9 fL — ABNORMAL HIGH (ref 80.0–100.0)
Monocytes Absolute: 0.6 10*3/uL (ref 0.1–1.0)
Monocytes Relative: 14 %
Neutro Abs: 3 10*3/uL (ref 1.7–7.7)
Neutrophils Relative %: 71 %
Platelet Count: 134 10*3/uL — ABNORMAL LOW (ref 150–400)
RBC: 3.61 MIL/uL — ABNORMAL LOW (ref 4.22–5.81)
RDW: 14.8 % (ref 11.5–15.5)
WBC Count: 4.3 10*3/uL (ref 4.0–10.5)
nRBC: 0 % (ref 0.0–0.2)

## 2020-01-27 LAB — CMP (CANCER CENTER ONLY)
ALT: 45 U/L — ABNORMAL HIGH (ref 0–44)
AST: 76 U/L — ABNORMAL HIGH (ref 15–41)
Albumin: 2.9 g/dL — ABNORMAL LOW (ref 3.5–5.0)
Alkaline Phosphatase: 234 U/L — ABNORMAL HIGH (ref 38–126)
Anion gap: 7 (ref 5–15)
BUN: 12 mg/dL (ref 8–23)
CO2: 25 mmol/L (ref 22–32)
Calcium: 9.3 mg/dL (ref 8.9–10.3)
Chloride: 105 mmol/L (ref 98–111)
Creatinine: 0.58 mg/dL — ABNORMAL LOW (ref 0.61–1.24)
GFR, Estimated: >60 mL/min (ref >60)
Glucose, Bld: 161 mg/dL — ABNORMAL HIGH (ref 70–99)
Potassium: 4 mmol/L (ref 3.5–5.1)
Sodium: 137 mmol/L (ref 135–145)
Total Bilirubin: 0.8 mg/dL (ref 0.3–1.2)
Total Protein: 6.4 g/dL — ABNORMAL LOW (ref 6.5–8.1)

## 2020-01-27 LAB — IRON AND TIBC
Iron: 45 ug/dL (ref 42–163)
Saturation Ratios: 13 % — ABNORMAL LOW (ref 20–55)
TIBC: 358 ug/dL (ref 202–409)
UIBC: 312 ug/dL (ref 117–376)

## 2020-01-27 LAB — RETICULOCYTES
Immature Retic Fract: 14.2 % (ref 2.3–15.9)
RBC.: 3.4 MIL/uL — ABNORMAL LOW (ref 4.22–5.81)
Retic Count, Absolute: 65.5 10*3/uL (ref 19.0–186.0)
Retic Ct Pct: 1.9 % (ref 0.4–3.1)

## 2020-01-27 LAB — TOTAL PROTEIN, URINE DIPSTICK

## 2020-01-27 LAB — LACTATE DEHYDROGENASE: LDH: 167 U/L (ref 98–192)

## 2020-01-27 LAB — FERRITIN: Ferritin: 150 ng/mL (ref 24–336)

## 2020-01-27 LAB — TSH: TSH: 1.11 u[IU]/mL (ref 0.320–4.118)

## 2020-01-27 MED ORDER — DENOSUMAB 120 MG/1.7ML ~~LOC~~ SOLN
SUBCUTANEOUS | Status: AC
  Filled 2020-01-27: qty 1.7

## 2020-01-27 MED ORDER — SODIUM CHLORIDE 0.9 % IV SOLN
Freq: Once | INTRAVENOUS | Status: AC
  Filled 2020-01-27: qty 250

## 2020-01-27 MED ORDER — SODIUM CHLORIDE 0.9 % IV SOLN
15.0000 mg/kg | Freq: Once | INTRAVENOUS | Status: AC
  Administered 2020-01-27: 1300 mg via INTRAVENOUS
  Filled 2020-01-27: qty 48

## 2020-01-27 MED ORDER — DENOSUMAB 120 MG/1.7ML ~~LOC~~ SOLN
120.0000 mg | Freq: Once | SUBCUTANEOUS | Status: AC
  Administered 2020-01-27: 120 mg via SUBCUTANEOUS

## 2020-01-27 MED ORDER — SODIUM CHLORIDE 0.9 % IV SOLN
1200.0000 mg | Freq: Once | INTRAVENOUS | Status: AC
  Administered 2020-01-27: 1200 mg via INTRAVENOUS
  Filled 2020-01-27: qty 20

## 2020-01-27 NOTE — Progress Notes (Signed)
Oncology Nurse Navigator Documentation  Oncology Nurse Navigator Flowsheets 01/27/2020  Abnormal Finding Date -  Confirmed Diagnosis Date -  Diagnosis Status -  Planned Course of Treatment -  Phase of Treatment -  Chemotherapy Actual Start Date: -  Chemo/Radiation Concurrent Pending- Reason: -  Radiation Actual Start Date: -  Navigator Follow Up Date: 02/15/2020  Navigator Follow Up Reason: Scan Review  Navigator Location [facility]  Referral Date to RadOnc/MedOnc -  Navigator Encounter Type Appt/Treatment Plan Review;Treatment  Telephone -  Treatment Initiated Date -  Patient Visit Type MedOnc  Treatment Phase Active Tx  Barriers/Navigation Needs Anxiety;Mobility Issues;Morbidities/Frailty;Pain  Education -  Interventions Coordination of Care;Psycho-Social Support  Acuity Level 2-Minimal Needs (1-2 Barriers Identified)  Referrals -  Coordination of Care Appts;Radiology  Education Method -  Support Groups/Services Friends and Family  Time Spent with Patient 30

## 2020-01-27 NOTE — Progress Notes (Signed)
Hematology and Oncology Follow Up Visit  AYAN HEFFINGTON [id number] [birth year] 64 y.o. 01/27/2020   Principle Diagnosis:  Hepatocellular carcinoma -- mets to bone, lung, lymph nodes  Current Therapy:        Palliative Radiation to the RIGHT femur/ chest wall Tecentriq/Avastin -- started 08/05/2019, s/p cycle #8 Xgeva 120 mg sq q 3 months -- next dose in 04/2020   Interim History:  Mr. Perales is here today with his wife.  He really looks quite good.  His main problem actually has been this wound on his right lower leg that is slow to heal.  I am sure that the Avastin that he is getting is causing [place] to heal slowly.  We will see about dressing this wound.  It does not look infected.  His last alpha-fetoprotein was 101.  This is still coming down which is nice to see.  He had a good Thanksgiving.  He and his wife spend it by themselves.  They enjoyed the time alone.  He has had no change in bowel or bladder habits.  He has had no problems with nausea or vomiting.  He had radiation therapy to the thoracic lesion.  He tolerated this quite well.  He has had no problems with fever.  There is actually no bleeding.  He has had no headache.  Currently, his performance status is ECOG 1.    Medications:  Allergies as of 01/27/2020   No Known Allergies     Medication List       Accurate as of January 27, 2020 11:06 AM. If you have any questions, ask your nurse or doctor.        STOP taking these medications   oxycodone 5 MG capsule Commonly known as: OXY-IR Stopped by: Volanda Napoleon, MD     TAKE these medications   acetaminophen 500 MG tablet Commonly known as: TYLENOL Take 500 mg by mouth every 8 (eight) hours as needed (pain).   amoxicillin 500 MG capsule Commonly known as: AMOXIL Prior to dental appointments   diazepam 2 MG tablet Commonly known as: VALIUM Take 1 tablet (2 mg total) by mouth every 12 (twelve) hours as needed for anxiety.   famotidine 10 MG  tablet Commonly known as: PEPCID Take 10 mg by mouth every morning.   metoprolol tartrate 100 MG tablet Commonly known as: LOPRESSOR Take 1 tablet (100 mg total) by mouth 2 (two) times daily.   multivitamin with minerals Tabs tablet Take 1 tablet by mouth daily.   ondansetron 8 MG tablet Commonly known as: ZOFRAN Take by mouth every 8 (eight) hours as needed.   polyethylene glycol powder 17 GM/SCOOP powder Commonly known as: GLYCOLAX/MIRALAX Take 1 Container by mouth daily as needed.   Potassium 99 MG Tabs Take 99 mg by mouth daily.   prochlorperazine 10 MG tablet Commonly known as: COMPAZINE Take 10 mg by mouth every 6 (six) hours as needed for nausea or vomiting.   rivaroxaban 20 MG Tabs tablet Commonly known as: Xarelto Take 1 tablet (20 mg total) by mouth daily with supper.   traMADol 50 MG tablet Commonly known as: ULTRAM TAKE 1 TABLET BY MOUTH EVERY 6 HOURS AS NEEDED   trolamine salicylate 10 % cream Commonly known as: ASPERCREME Apply 1 application topically as needed for muscle pain.       Allergies: No Known Allergies  Past Medical History, Surgical history, Social history, and Family History were reviewed and updated.  Review of Systems: Review of  Systems  Constitutional: Negative.   HENT: Negative.   Eyes: Negative.   Respiratory: Negative.   Cardiovascular: Negative.   Gastrointestinal: Negative.   Genitourinary: Negative.   Musculoskeletal: Positive for back pain, falls and joint pain.  Skin: Negative.   Neurological: Positive for focal weakness.  Endo/Heme/Allergies: Negative.   Psychiatric/Behavioral: Negative.      Physical Exam:  weight is 195 lb (88.5 kg). His oral temperature is 97.4 F (36.3 C) (abnormal). His blood pressure is 114/79 and his pulse is 93. His respiration is 20 and oxygen saturation is 100%.   Wt Readings from Last 3 Encounters:  01/27/20 195 lb (88.5 kg)  01/16/20 197 lb (89.4 kg)  12/30/19 205 lb (93 kg)     Physical Exam Vitals reviewed.  HENT:     Head: Normocephalic and atraumatic.  Eyes:     Pupils: Pupils are equal, round, and reactive to light.  Cardiovascular:     Rate and Rhythm: Normal rate and regular rhythm.     Heart sounds: Normal heart sounds.  Pulmonary:     Effort: Pulmonary effort is normal.     Breath sounds: Normal breath sounds.  Abdominal:     General: Bowel sounds are normal.     Palpations: Abdomen is soft.  Musculoskeletal:        General: No tenderness or deformity. Normal range of motion.     Cervical back: Normal range of motion.  Lymphadenopathy:     Cervical: No cervical adenopathy.  Skin:    General: Skin is warm and dry.     Findings: No erythema or rash.  Neurological:     Mental Status: He is alert and oriented to person, place, and time.  Psychiatric:        Behavior: Behavior normal.        Thought Content: Thought content normal.        Judgment: Judgment normal.      Lab Results  Component Value Date   WBC 4.3 01/27/2020   HGB 11.5 (L) 01/27/2020   HCT 36.8 (L) 01/27/2020   MCV 101.9 (H) 01/27/2020   PLT 134 (L) 01/27/2020   Lab Results  Component Value Date   FERRITIN 738 (H) 08/18/2019   IRON 63 08/18/2019   TIBC 341 08/18/2019   UIBC 278 08/18/2019   IRONPCTSAT 18 (L) 08/18/2019   Lab Results  Component Value Date   RETICCTPCT 1.9 01/27/2020   RBC 3.40 (L) 01/27/2020   Lab Results  Component Value Date   KPAFRELGTCHN 23.3 (H) 06/20/2019   LAMBDASER 19.9 06/20/2019   KAPLAMBRATIO 1.17 06/20/2019   Lab Results  Component Value Date   IGGSERUM 1,078 06/20/2019   IGA 457 (H) 06/20/2019   IGMSERUM 80 06/20/2019   Lab Results  Component Value Date   TOTALPROTELP 6.2 06/20/2019   ALBUMINELP 3.2 06/20/2019   A1GS 0.3 06/20/2019   A2GS 0.6 06/20/2019   BETS 1.2 06/20/2019   GAMS 0.9 06/20/2019   MSPIKE Not Observed 06/20/2019     Chemistry      Component Value Date/Time   NA 137 01/27/2020 1017   NA 137  05/13/2019 1110   K 4.0 01/27/2020 1017   CL 105 01/27/2020 1017   CO2 25 01/27/2020 1017   BUN 12 01/27/2020 1017   BUN 7 (L) 05/13/2019 1110   CREATININE 0.58 (L) 01/27/2020 1017      Component Value Date/Time   CALCIUM 9.3 01/27/2020 1017   ALKPHOS 234 (H) 01/27/2020 1017  AST 76 (H) 01/27/2020 1017   ALT 45 (H) 01/27/2020 1017   BILITOT 0.8 01/27/2020 1017      Impression and Plan: Mr. Weisgerber is a very pleasant 64 yo caucasian gentleman with metastatic hepatocellular carcinoma.   I am happy that his quality of life is doing so well right now.  He really has tolerated treatment quite nicely.  I glad that the hemoglobin is better.  We will go ahead with treatment.  This will be is ninth of treatment.  We will then set him up with scans so we can see how everything looks.  We will plan to get him back after Christmas.    Volanda Napoleon, MD 12/3/202111:06 AM

## 2020-01-27 NOTE — Patient Instructions (Signed)
Bevacizumab injection What is this medicine? BEVACIZUMAB (be va SIZ yoo mab) is a monoclonal antibody. It is used to treat many types of cancer. This medicine may be used for other purposes; ask your health care provider or pharmacist if you have questions. COMMON BRAND NAME(S): Avastin, MVASI, Zirabev What should I tell my health care provider before I take this medicine? They need to know if you have any of these conditions:  diabetes  heart disease  high blood pressure  history of coughing up blood  prior anthracycline chemotherapy (e.g., doxorubicin, daunorubicin, epirubicin)  recent or ongoing radiation therapy  recent or planning to have surgery  stroke  an unusual or allergic reaction to bevacizumab, hamster proteins, mouse proteins, other medicines, foods, dyes, or preservatives  pregnant or trying to get pregnant  breast-feeding How should I use this medicine? This medicine is for infusion into a vein. It is given by a health care professional in a hospital or clinic setting. Talk to your pediatrician regarding the use of this medicine in children. Special care may be needed. Overdosage: If you think you have taken too much of this medicine contact a poison control center or emergency room at once. NOTE: This medicine is only for you. Do not share this medicine with others. What if I miss a dose? It is important not to miss your dose. Call your doctor or health care professional if you are unable to keep an appointment. What may interact with this medicine? Interactions are not expected. This list may not describe all possible interactions. Give your health care provider a list of all the medicines, herbs, non-prescription drugs, or dietary supplements you use. Also tell them if you smoke, drink alcohol, or use illegal drugs. Some items may interact with your medicine. What should I watch for while using this medicine? Your condition will be monitored carefully while  you are receiving this medicine. You will need important blood work and urine testing done while you are taking this medicine. This medicine may increase your risk to bruise or bleed. Call your doctor or health care professional if you notice any unusual bleeding. Before having surgery, talk to your health care provider to make sure it is ok. This drug can increase the risk of poor healing of your surgical site or wound. You will need to stop this drug for 28 days before surgery. After surgery, wait at least 28 days before restarting this drug. Make sure the surgical site or wound is healed enough before restarting this drug. Talk to your health care provider if questions. Do not become pregnant while taking this medicine or for 6 months after stopping it. Women should inform their doctor if they wish to become pregnant or think they might be pregnant. There is a potential for serious side effects to an unborn child. Talk to your health care professional or pharmacist for more information. Do not breast-feed an infant while taking this medicine and for 6 months after the last dose. This medicine has caused ovarian failure in some women. This medicine may interfere with the ability to have a child. You should talk to your doctor or health care professional if you are concerned about your fertility. What side effects may I notice from receiving this medicine? Side effects that you should report to your doctor or health care professional as soon as possible:  allergic reactions like skin rash, itching or hives, swelling of the face, lips, or tongue  chest pain or chest tightness    chills  coughing up blood  high fever  seizures  severe constipation  signs and symptoms of bleeding such as bloody or black, tarry stools; red or dark-brown urine; spitting up blood or brown material that looks like coffee grounds; red spots on the skin; unusual bruising or bleeding from the eye, gums, or nose  signs  and symptoms of a blood clot such as breathing problems; chest pain; severe, sudden headache; pain, swelling, warmth in the leg  signs and symptoms of a stroke like changes in vision; confusion; trouble speaking or understanding; severe headaches; sudden numbness or weakness of the face, arm or leg; trouble walking; dizziness; loss of balance or coordination  stomach pain  sweating  swelling of legs or ankles  vomiting  weight gain Side effects that usually do not require medical attention (report to your doctor or health care professional if they continue or are bothersome):  back pain  changes in taste  decreased appetite  dry skin  nausea  tiredness This list may not describe all possible side effects. Call your doctor for medical advice about side effects. You may report side effects to FDA at 1-800-FDA-1088. Where should I keep my medicine? This drug is given in a hospital or clinic and will not be stored at home. NOTE: This sheet is a summary. It may not cover all possible information. If you have questions about this medicine, talk to your doctor, pharmacist, or health care provider.  2020 Elsevier/Gold Standard (2018-12-08 10:50:46)   Atezolizumab injection What is this medicine? ATEZOLIZUMAB (a te zoe LIZ ue mab) is a monoclonal antibody. It is used to treat bladder cancer (urothelial cancer), liver cancer, lung cancer, breast cancer, and melanoma. This medicine may be used for other purposes; ask your health care provider or pharmacist if you have questions. COMMON BRAND NAME(S): Tecentriq What should I tell my health care provider before I take this medicine? They need to know if you have any of these conditions:  diabetes  immune system problems  infection  inflammatory bowel disease  liver disease  lung or breathing disease  lupus  nervous system problems like myasthenia gravis or Guillain-Barre syndrome  organ transplant  an unusual or  allergic reaction to atezolizumab, other medicines, foods, dyes, or preservatives  pregnant or trying to get pregnant  breast-feeding How should I use this medicine? This medicine is for infusion into a vein. It is given by a health care professional in a hospital or clinic setting. A special MedGuide will be given to you before each treatment. Be sure to read this information carefully each time. Talk to your pediatrician regarding the use of this medicine in children. Special care may be needed. Overdosage: If you think you have taken too much of this medicine contact a poison control center or emergency room at once. NOTE: This medicine is only for you. Do not share this medicine with others. What if I miss a dose? It is important not to miss your dose. Call your doctor or health care professional if you are unable to keep an appointment. What may interact with this medicine? Interactions have not been studied. This list may not describe all possible interactions. Give your health care provider a list of all the medicines, herbs, non-prescription drugs, or dietary supplements you use. Also tell them if you smoke, drink alcohol, or use illegal drugs. Some items may interact with your medicine. What should I watch for while using this medicine? Your condition will be monitored carefully  while you are receiving this medicine. You may need blood work done while you are taking this medicine. Do not become pregnant while taking this medicine or for at least 5 months after stopping it. Women should inform their doctor if they wish to become pregnant or think they might be pregnant. There is a potential for serious side effects to an unborn child. Talk to your health care professional or pharmacist for more information. Do not breast-feed an infant while taking this medicine or for at least 5 months after the last dose. What side effects may I notice from receiving this medicine? Side effects that  you should report to your doctor or health care professional as soon as possible:  allergic reactions like skin rash, itching or hives, swelling of the face, lips, or tongue  black, tarry stools  bloody or watery diarrhea  breathing problems  changes in vision  chest pain or chest tightness  chills  facial flushing  fever  headache  signs and symptoms of high blood sugar such as dizziness; dry mouth; dry skin; fruity breath; nausea; stomach pain; increased hunger or thirst; increased urination  signs and symptoms of liver injury like dark yellow or brown urine; general ill feeling or flu-like symptoms; light-colored stools; loss of appetite; nausea; right upper belly pain; unusually weak or tired; yellowing of the eyes or skin  stomach pain  trouble passing urine or change in the amount of urine Side effects that usually do not require medical attention (report to your doctor or health care professional if they continue or are bothersome):  bone pain  cough  diarrhea  joint pain  muscle pain  muscle weakness  swelling of arms or legs  tiredness  weight loss This list may not describe all possible side effects. Call your doctor for medical advice about side effects. You may report side effects to FDA at 1-800-FDA-1088. Where should I keep my medicine? This drug is given in a hospital or clinic and will not be stored at home. NOTE: This sheet is a summary. It may not cover all possible information. If you have questions about this medicine, talk to your doctor, pharmacist, or health care provider.  2020 Elsevier/Gold Standard (2018-10-01 13:11:14)

## 2020-01-28 LAB — AFP TUMOR MARKER: AFP, Serum, Tumor Marker: 341 ng/mL — ABNORMAL HIGH (ref 0.0–8.3)

## 2020-01-28 LAB — T4: T4, Total: 5.5 ug/dL (ref 4.5–12.0)

## 2020-01-30 ENCOUNTER — Telehealth: Payer: Self-pay

## 2020-01-30 NOTE — Telephone Encounter (Signed)
S/w pts wife per  01/27/20 los and she is aware of the f.u appts    AOM

## 2020-01-31 ENCOUNTER — Other Ambulatory Visit: Payer: Self-pay | Admitting: Hematology & Oncology

## 2020-02-06 ENCOUNTER — Other Ambulatory Visit: Payer: Self-pay | Admitting: Internal Medicine

## 2020-02-06 ENCOUNTER — Other Ambulatory Visit: Payer: Self-pay | Admitting: Hematology & Oncology

## 2020-02-06 DIAGNOSIS — C220 Liver cell carcinoma: Secondary | ICD-10-CM

## 2020-02-06 DIAGNOSIS — C7951 Secondary malignant neoplasm of bone: Secondary | ICD-10-CM

## 2020-02-06 NOTE — Telephone Encounter (Signed)
Age 64, weight 88.5kg, SCr 0.58 on 01/27/20, CrCl > 100 Last OV Oct 2021, afib indication

## 2020-02-07 ENCOUNTER — Telehealth: Payer: Self-pay | Admitting: Internal Medicine

## 2020-02-07 MED ORDER — RIVAROXABAN 20 MG PO TABS
20.0000 mg | ORAL_TABLET | Freq: Every day | ORAL | 6 refills | Status: DC
Start: 2020-02-07 — End: 2020-05-04

## 2020-02-07 NOTE — Telephone Encounter (Signed)
Refill done as requested ./cy

## 2020-02-07 NOTE — Telephone Encounter (Signed)
*  STAT* If patient is at the pharmacy, call can be transferred to refill team.   1. Which medications need to be refilled? (please list name of each medication and dose if known) XARELTO 20 MG TABS tablet  2. Which pharmacy/location (including street and city if local pharmacy) is medication to be sent to? [place], [place] [address]  3. Do they need a 30 day or 90 day supply? [place]

## 2020-02-15 ENCOUNTER — Encounter (HOSPITAL_BASED_OUTPATIENT_CLINIC_OR_DEPARTMENT_OTHER): Payer: Self-pay

## 2020-02-15 ENCOUNTER — Encounter: Payer: Self-pay | Admitting: *Deleted

## 2020-02-15 ENCOUNTER — Other Ambulatory Visit: Payer: Self-pay

## 2020-02-15 ENCOUNTER — Ambulatory Visit (HOSPITAL_BASED_OUTPATIENT_CLINIC_OR_DEPARTMENT_OTHER)
Admission: RE | Admit: 2020-02-15 | Discharge: 2020-02-15 | Disposition: A | Discharge status: Home / Self Care | Payer: Medicare Other | Source: Ambulatory Visit | Attending: Hematology & Oncology | Admitting: Hematology & Oncology

## 2020-02-15 DIAGNOSIS — C7800 Secondary malignant neoplasm of unspecified lung: Secondary | ICD-10-CM | POA: Diagnosis present

## 2020-02-15 DIAGNOSIS — C220 Liver cell carcinoma: Secondary | ICD-10-CM | POA: Diagnosis present

## 2020-02-15 MED ORDER — IOHEXOL 300 MG/ML  SOLN
100.0000 mL | Freq: Once | INTRAMUSCULAR | Status: AC | PRN
  Administered 2020-02-15: 09:00:00 100 mL via INTRAVENOUS

## 2020-02-15 NOTE — Progress Notes (Signed)
Oncology Nurse Navigator Documentation  Oncology Nurse Navigator Flowsheets 02/15/2020  Abnormal Finding Date -  Confirmed Diagnosis Date -  Diagnosis Status -  Planned Course of Treatment -  Phase of Treatment -  Chemotherapy Actual Start Date: -  Chemo/Radiation Concurrent Pending- Reason: -  Radiation Actual Start Date: -  Navigator Follow Up Date: 02/20/2020  Navigator Follow Up Reason: Follow-up Appointment;Chemotherapy  Navigator Location [facility]  Referral Date to RadOnc/MedOnc -  Navigator Encounter Type Scan Review  Telephone -  Treatment Initiated Date -  Patient Visit Type MedOnc  Treatment Phase Active Tx  Barriers/Navigation Needs Anxiety;Mobility Issues;Morbidities/Frailty;Pain  Education -  Interventions None Required  Acuity Level 2-Minimal Needs (1-2 Barriers Identified)  Referrals -  Coordination of Care -  Education Method -  Support Groups/Services Friends and Family  Time Spent with Patient 15

## 2020-02-20 ENCOUNTER — Encounter: Payer: Self-pay | Admitting: *Deleted

## 2020-02-20 ENCOUNTER — Telehealth: Payer: Self-pay | Admitting: Hematology & Oncology

## 2020-02-20 ENCOUNTER — Inpatient Hospital Stay: Payer: Medicare Other

## 2020-02-20 ENCOUNTER — Other Ambulatory Visit: Payer: Self-pay

## 2020-02-20 ENCOUNTER — Encounter: Payer: Self-pay | Admitting: Hematology & Oncology

## 2020-02-20 ENCOUNTER — Inpatient Hospital Stay (HOSPITAL_BASED_OUTPATIENT_CLINIC_OR_DEPARTMENT_OTHER): Payer: Medicare Other | Admitting: Hematology & Oncology

## 2020-02-20 VITALS — BP 115/80 | HR 96 | Temp 98.7°F | Resp 17 | Wt 202.0 lb

## 2020-02-20 VITALS — BP 114/68 | HR 86 | Resp 17

## 2020-02-20 DIAGNOSIS — Z5112 Encounter for antineoplastic immunotherapy: Secondary | ICD-10-CM | POA: Diagnosis not present

## 2020-02-20 DIAGNOSIS — I2510 Atherosclerotic heart disease of native coronary artery without angina pectoris: Secondary | ICD-10-CM | POA: Diagnosis not present

## 2020-02-20 DIAGNOSIS — C7801 Secondary malignant neoplasm of right lung: Secondary | ICD-10-CM

## 2020-02-20 DIAGNOSIS — C7800 Secondary malignant neoplasm of unspecified lung: Secondary | ICD-10-CM

## 2020-02-20 DIAGNOSIS — C220 Liver cell carcinoma: Secondary | ICD-10-CM

## 2020-02-20 DIAGNOSIS — D500 Iron deficiency anemia secondary to blood loss (chronic): Secondary | ICD-10-CM | POA: Diagnosis not present

## 2020-02-20 HISTORY — DX: Iron deficiency anemia secondary to blood loss (chronic): D50.0

## 2020-02-20 LAB — LACTATE DEHYDROGENASE: LDH: 160 U/L (ref 98–192)

## 2020-02-20 LAB — CBC WITH DIFFERENTIAL (CANCER CENTER ONLY)
Abs Immature Granulocytes: 0.01 10*3/uL (ref 0.00–0.07)
Basophils Absolute: 0 10*3/uL (ref 0.0–0.1)
Basophils Relative: 1 %
Eosinophils Absolute: 0.1 10*3/uL (ref 0.0–0.5)
Eosinophils Relative: 3 %
HCT: 37.6 % — ABNORMAL LOW (ref 39.0–52.0)
Hemoglobin: 11.8 g/dL — ABNORMAL LOW (ref 13.0–17.0)
Immature Granulocytes: 0 %
Lymphocytes Relative: 13 %
Lymphs Abs: 0.5 10*3/uL — ABNORMAL LOW (ref 0.7–4.0)
MCH: 31 pg (ref 26.0–34.0)
MCHC: 31.4 g/dL (ref 30.0–36.0)
MCV: 98.7 fL (ref 80.0–100.0)
Monocytes Absolute: 0.6 10*3/uL (ref 0.1–1.0)
Monocytes Relative: 15 %
Neutro Abs: 2.6 10*3/uL (ref 1.7–7.7)
Neutrophils Relative %: 68 %
Platelet Count: 135 10*3/uL — ABNORMAL LOW (ref 150–400)
RBC: 3.81 MIL/uL — ABNORMAL LOW (ref 4.22–5.81)
RDW: 15.3 % (ref 11.5–15.5)
WBC Count: 3.8 10*3/uL — ABNORMAL LOW (ref 4.0–10.5)
nRBC: 0 % (ref 0.0–0.2)

## 2020-02-20 LAB — CMP (CANCER CENTER ONLY)
ALT: 36 U/L (ref 0–44)
AST: 76 U/L — ABNORMAL HIGH (ref 15–41)
Albumin: 2.9 g/dL — ABNORMAL LOW (ref 3.5–5.0)
Alkaline Phosphatase: 248 U/L — ABNORMAL HIGH (ref 38–126)
Anion gap: 7 (ref 5–15)
BUN: 12 mg/dL (ref 8–23)
CO2: 25 mmol/L (ref 22–32)
Calcium: 9 mg/dL (ref 8.9–10.3)
Chloride: 106 mmol/L (ref 98–111)
Creatinine: 0.57 mg/dL — ABNORMAL LOW (ref 0.61–1.24)
GFR, Estimated: >60 mL/min (ref >60)
Glucose, Bld: 144 mg/dL — ABNORMAL HIGH (ref 70–99)
Potassium: 4 mmol/L (ref 3.5–5.1)
Sodium: 138 mmol/L (ref 135–145)
Total Bilirubin: 0.8 mg/dL (ref 0.3–1.2)
Total Protein: 6.2 g/dL — ABNORMAL LOW (ref 6.5–8.1)

## 2020-02-20 LAB — TOTAL PROTEIN, URINE DIPSTICK: Protein, ur: NEGATIVE mg/dL

## 2020-02-20 LAB — TSH: TSH: 1.594 u[IU]/mL (ref 0.320–4.118)

## 2020-02-20 MED ORDER — SODIUM CHLORIDE 0.9 % IV SOLN
510.0000 mg | Freq: Once | INTRAVENOUS | Status: AC
  Administered 2020-02-20: 11:00:00 510 mg via INTRAVENOUS
  Filled 2020-02-20: qty 510

## 2020-02-20 MED ORDER — SODIUM CHLORIDE 0.9 % IV SOLN
Freq: Once | INTRAVENOUS | Status: AC
  Filled 2020-02-20: qty 250

## 2020-02-20 NOTE — Telephone Encounter (Signed)
Appointments scheduled patient will get updated schedule from infusion nurses today whiel getting iron infusion. Per 12/27 los

## 2020-02-20 NOTE — Progress Notes (Signed)
Oncology Nurse Navigator Documentation  Oncology Nurse Navigator Flowsheets 02/20/2020  Abnormal Finding Date -  Confirmed Diagnosis Date -  Diagnosis Status -  Planned Course of Treatment -  Phase of Treatment -  Chemotherapy Actual Start Date: -  Chemo/Radiation Concurrent Pending- Reason: -  Radiation Actual Start Date: -  Navigator Follow Up Date: 02/27/2020  Navigator Follow Up Reason: Chemotherapy  Navigator Location [facility]  Referral Date to RadOnc/MedOnc -  Navigator Encounter Type Appt/Treatment Plan Review;Treatment  Telephone -  Treatment Initiated Date -  Patient Visit Type MedOnc  Treatment Phase Active Tx  Barriers/Navigation Needs Anxiety;Mobility Issues;Morbidities/Frailty;Pain  Education -  Interventions Psycho-Social Support  Acuity Level 2-Minimal Needs (1-2 Barriers Identified)  Referrals -  Coordination of Care -  Education Method -  Support Groups/Services Friends and Family  Time Spent with Patient 30

## 2020-02-20 NOTE — Patient Instructions (Signed)

## 2020-02-20 NOTE — Progress Notes (Signed)
DISCONTINUE OFF PATHWAY REGIMEN - Other   [id number]:Atezolizumab 1,200 mg IV D1 + Bevacizumab 15 mg/kg IV D1 q21 Days:   A cycle is every 21 Days:     Atezolizumab      Bevacizumab-xxxx   **Always confirm dose/schedule in your pharmacy ordering system**  REASON: Disease Progression PRIOR TREATMENT: Atezolizumab 1,200 mg IV D1 + Bevacizumab 15 mg/kg IV D1 q21 Days TREATMENT RESPONSE: Partial Response (PR)  START OFF PATHWAY REGIMEN - Other   [id number]:Ipilimumab 1 mg/kg + Nivolumab 3 mg/kg q21 Days x 4 Cycles Followed by Nivolumab 240 mg q14 Days:   A cycle is every 21 days:     Nivolumab      Ipilimumab    A cycle is every 14 days:     Nivolumab   **Always confirm dose/schedule in your pharmacy ordering system**  Patient Characteristics: Intent of Therapy: Non-Curative / Palliative Intent, Discussed with Patient

## 2020-02-20 NOTE — Progress Notes (Signed)
Hematology and Oncology Follow Up Visit  Kerry Adams [id number] 27-Jun-1955 64 y.o. 02/20/2020   Principle Diagnosis:  Hepatocellular carcinoma -- mets to bone, lung, lymph nodes Iron def anemia  Current Therapy:        Palliative Radiation to the RIGHT femur/ chest wall Tecentriq/Avastin -- started 08/05/2019, s/p cycle #9  -- d/c on 02/20/2020 due to progressive cancer Nivolumab/Yervoy -- start cycle #1 on 02/27/2020 Xgeva 120 mg sq q 3 months -- next dose in 04/2020 Feraheme -- dose given on 02/20/2020   Interim History:  Mr. Kerry Adams is here today with his wife.  He did have a very nice Christmas.  He got a golf cart.  His job around the neighborhood.  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  Unfortunately, his cancer is going to progress again.  We last saw him, his alpha-fetoprotein was up to 350.  We did do a CT scan on him.  The CT scan unfortunately shows that he has progression outside of the liver with lymphadenopathy and also bony progression.  He did have radiation therapy to right first anterior rib.  This unfortunately has not worked.  He is having more pain in the right upper chest.  I think we should change treatments on him.  I think double immunotherapy would not be a bad idea.  Since he had a transient response to Tecentriq/Avastin, maybe we get a better response with double immunotherapy.  He also is iron deficient.  We checked his iron studies last time we saw him.  His iron saturation was only 15%.  He is not noted any obvious melena or hematochezia.  His appetite is doing okay.  He had a very nice Christmas dinner.  He is had no nausea or vomiting.  He has had no obvious change in bowel or bladder habits.  He has had no diarrhea.  There is a little bit of leg swelling which is chronic.  The wound on the right lower leg is healing up finally.  He has had no headache.  He has had no shortness of breath.  Overall, I  was his performance status is ECOG 1.    Medications:  Allergies as of 02/20/2020   No Known Allergies     Medication List       Accurate as of February 20, 2020  9:43 AM. If you have any questions, ask your nurse or doctor.        acetaminophen 500 MG tablet Commonly known as: TYLENOL Take 500 mg by mouth every 8 (eight) hours as needed (pain).   amoxicillin 500 MG capsule Commonly known as: AMOXIL Prior to dental appointments   diazepam 2 MG tablet Commonly known as: VALIUM Take 1 tablet (2 mg total) by mouth every 12 (twelve) hours as needed for anxiety.   famotidine 10 MG tablet Commonly known as: PEPCID Take 10 mg by mouth every morning.   metoprolol tartrate 100 MG tablet Commonly known as: LOPRESSOR Take 1 tablet (100 mg total) by mouth 2 (two) times daily.   multivitamin with minerals Tabs tablet Take 1 tablet by mouth daily.   ondansetron 8 MG tablet Commonly known as: ZOFRAN Take by mouth every 8 (eight) hours as needed.   polyethylene glycol powder 17 GM/SCOOP powder Commonly known as: GLYCOLAX/MIRALAX Take 1 Container by mouth daily as needed.   Potassium 99 MG Tabs Take 99 mg by mouth daily.   prochlorperazine 10 MG tablet Commonly  known as: COMPAZINE Take 10 mg by mouth every 6 (six) hours as needed for nausea or vomiting.   rivaroxaban 20 MG Tabs tablet Commonly known as: Xarelto Take 1 tablet (20 mg total) by mouth daily with supper.   traMADol 50 MG tablet Commonly known as: ULTRAM TAKE 1 TABLET BY MOUTH EVERY 6 HOURS AS NEEDED   trolamine salicylate 10 % cream Commonly known as: ASPERCREME Apply 1 application topically as needed for muscle pain.       Allergies: No Known Allergies  Past Medical History, Surgical history, Social history, and Family History were reviewed and updated.  Review of Systems: Review of Systems  Constitutional: Negative.   HENT: Negative.   Eyes: Negative.   Respiratory: Negative.   Cardiovascular:  Negative.   Gastrointestinal: Negative.   Genitourinary: Negative.   Musculoskeletal: Positive for back pain, falls and joint pain.  Skin: Negative.   Neurological: Positive for focal weakness.  Endo/Heme/Allergies: Negative.   Psychiatric/Behavioral: Negative.      Physical Exam:  weight is 202 lb (91.6 kg). His oral temperature is 98.7 F (37.1 C). His blood pressure is 115/80 and his pulse is 96. His respiration is 17 and oxygen saturation is 100%.   Wt Readings from Last 3 Encounters:  02/20/20 202 lb (91.6 kg)  01/27/20 195 lb (88.5 kg)  01/16/20 197 lb (89.4 kg)    Physical Exam Vitals reviewed.  HENT:     Head: Normocephalic and atraumatic.  Eyes:     Pupils: Pupils are equal, round, and reactive to light.  Cardiovascular:     Rate and Rhythm: Normal rate and regular rhythm.     Heart sounds: Normal heart sounds.  Pulmonary:     Effort: Pulmonary effort is normal.     Breath sounds: Normal breath sounds.  Abdominal:     General: Bowel sounds are normal.     Palpations: Abdomen is soft.  Musculoskeletal:        General: No tenderness or deformity. Normal range of motion.     Cervical back: Normal range of motion.  Lymphadenopathy:     Cervical: No cervical adenopathy.  Skin:    General: Skin is warm and dry.     Findings: No erythema or rash.  Neurological:     Mental Status: He is alert and oriented to person, place, and time.  Psychiatric:        Behavior: Behavior normal.        Thought Content: Thought content normal.        Judgment: Judgment normal.      Lab Results  Component Value Date   WBC 3.8 (L) 02/20/2020   HGB 11.8 (L) 02/20/2020   HCT 37.6 (L) 02/20/2020   MCV 98.7 02/20/2020   PLT 135 (L) 02/20/2020   Lab Results  Component Value Date   FERRITIN 150 01/27/2020   IRON 45 01/27/2020   TIBC 358 01/27/2020   UIBC 312 01/27/2020   IRONPCTSAT 13 (L) 01/27/2020   Lab Results  Component Value Date   RETICCTPCT 1.9 01/27/2020   RBC  3.81 (L) 02/20/2020   Lab Results  Component Value Date   KPAFRELGTCHN 23.3 (H) 06/20/2019   LAMBDASER 19.9 06/20/2019   KAPLAMBRATIO 1.17 06/20/2019   Lab Results  Component Value Date   IGGSERUM 1,078 06/20/2019   IGA 457 (H) 06/20/2019   IGMSERUM 80 06/20/2019   Lab Results  Component Value Date   TOTALPROTELP 6.2 06/20/2019   ALBUMINELP 3.2 06/20/2019  A1GS 0.3 06/20/2019   A2GS 0.6 06/20/2019   BETS 1.2 06/20/2019   GAMS 0.9 06/20/2019   MSPIKE Not Observed 06/20/2019     Chemistry      Component Value Date/Time   NA 138 02/20/2020 0844   NA 137 05/13/2019 1110   K 4.0 02/20/2020 0844   CL 106 02/20/2020 0844   CO2 25 02/20/2020 0844   BUN 12 02/20/2020 0844   BUN 7 (L) 05/13/2019 1110   CREATININE 0.57 (L) 02/20/2020 0844      Component Value Date/Time   CALCIUM 9.0 02/20/2020 0844   ALKPHOS 248 (H) 02/20/2020 0844   AST 76 (H) 02/20/2020 0844   ALT 36 02/20/2020 0844   BILITOT 0.8 02/20/2020 0844      Impression and Plan: Mr. Calica is a very pleasant 64 yo caucasian gentleman with metastatic hepatocellular carcinoma.   I am very disappointed that we have to change treatments.  We did get quite a bit of "mileage" out of the Tecentriq/Avastin.  I am happy about this.  Hopefully, we will get a lot of mileage out of the double immunotherapy with nivolumab/Yervoy.  We will start this next week.  He will get iron today.  Hopefully this will help with some of the stamina.  I will plan to see him back in about 4 weeks we do our second cycle of the Yervoy/nivolumab.  He will get 4 cycles of the combination and then we will try to go with single agent immunotherapy if he responds.  His alpha-fetoprotein is always a very good measurement of response for him.    Josph Macho, MD 12/27/20219:43 AM

## 2020-02-20 NOTE — Progress Notes (Signed)
Okay for Venofer per April Manson, Patient Advocate.

## 2020-02-21 LAB — AFP TUMOR MARKER: AFP, Serum, Tumor Marker: 420 ng/mL — ABNORMAL HIGH (ref 0.0–8.3)

## 2020-02-21 LAB — T4: T4, Total: 4.8 ug/dL (ref 4.5–12.0)

## 2020-02-21 NOTE — Progress Notes (Signed)
Doses modified to hepatocellular regimen per Dr. Gustavo Lah instructions.

## 2020-02-21 NOTE — Progress Notes (Signed)
Pharmacist Chemotherapy Monitoring - Initial Assessment    Anticipated start date: 02/27/20  Regimen:  . Are orders appropriate based on the patient's diagnosis, regimen, and cycle? Yes . Does the plan date match the patient's scheduled date? Yes . Is the sequencing of drugs appropriate? Yes . Are the premedications appropriate for the patient's regimen? Yes . Prior Authorization for treatment is: Not Started o If applicable, is the correct biosimilar selected based on the patient's insurance? not applicable  Organ Function and Labs: Marland Kitchen Are dose adjustments needed based on the patient's renal function, hepatic function, or hematologic function? No . Are appropriate labs ordered prior to the start of patient's treatment? Yes . Other organ system assessment, if indicated: N/A . The following baseline labs, if indicated, have been ordered: ipilimumab: baseline TSH +/- T4 and nivolumab: baseline TSH +/- T4  Dose Assessment: . Are the drug doses appropriate? Yes . Are the following correct: o Drug concentrations Yes o IV fluid compatible with drug Yes o Administration routes Yes o Timing of therapy Yes . If applicable, does the patient have documented access for treatment and/or plans for port-a-cath placement? not applicable . If applicable, have lifetime cumulative doses been properly documented and assessed? not applicable Lifetime Dose Tracking  No doses have been documented on this patient for the following tracked chemicals: Doxorubicin, Epirubicin, Idarubicin, Daunorubicin, Mitoxantrone, Bleomycin, Oxaliplatin, Carboplatin, Liposomal Doxorubicin  o   Toxicity Monitoring/Prevention: . The patient has the following take home antiemetics prescribed: Prochlorperazine . The patient has the following take home medications prescribed: hypersensitivity pre-meds  . Medication allergies and previous infusion related reactions, if applicable, have been reviewed and addressed. Yes . The  patient's current medication list has been assessed for drug-drug interactions with their chemotherapy regimen. no significant drug-drug interactions were identified on review.  Order Review: . Are the treatment plan orders signed? Yes . Is the patient scheduled to see a provider prior to their treatment? No  I verify that I have reviewed each item in the above checklist and answered each question accordingly.  Raj Landress, Nevin Bloodgood 02/21/2020 1:44 PM

## 2020-02-23 ENCOUNTER — Other Ambulatory Visit: Payer: Self-pay | Admitting: *Deleted

## 2020-02-23 DIAGNOSIS — C220 Liver cell carcinoma: Secondary | ICD-10-CM

## 2020-02-23 DIAGNOSIS — C7800 Secondary malignant neoplasm of unspecified lung: Secondary | ICD-10-CM

## 2020-02-23 DIAGNOSIS — C7801 Secondary malignant neoplasm of right lung: Secondary | ICD-10-CM

## 2020-02-27 ENCOUNTER — Other Ambulatory Visit: Payer: Self-pay

## 2020-02-27 ENCOUNTER — Inpatient Hospital Stay: Payer: Medicare Other | Attending: Hematology & Oncology

## 2020-02-27 ENCOUNTER — Encounter: Payer: Self-pay | Admitting: *Deleted

## 2020-02-27 ENCOUNTER — Inpatient Hospital Stay: Payer: Medicare Other

## 2020-02-27 VITALS — BP 101/73 | HR 84 | Temp 97.6°F | Resp 17

## 2020-02-27 DIAGNOSIS — C220 Liver cell carcinoma: Secondary | ICD-10-CM | POA: Diagnosis not present

## 2020-02-27 DIAGNOSIS — C7800 Secondary malignant neoplasm of unspecified lung: Secondary | ICD-10-CM | POA: Insufficient documentation

## 2020-02-27 DIAGNOSIS — Z5112 Encounter for antineoplastic immunotherapy: Secondary | ICD-10-CM | POA: Diagnosis not present

## 2020-02-27 DIAGNOSIS — R531 Weakness: Secondary | ICD-10-CM | POA: Diagnosis not present

## 2020-02-27 DIAGNOSIS — C7951 Secondary malignant neoplasm of bone: Secondary | ICD-10-CM | POA: Insufficient documentation

## 2020-02-27 DIAGNOSIS — M549 Dorsalgia, unspecified: Secondary | ICD-10-CM | POA: Insufficient documentation

## 2020-02-27 DIAGNOSIS — C7801 Secondary malignant neoplasm of right lung: Secondary | ICD-10-CM

## 2020-02-27 DIAGNOSIS — Z79899 Other long term (current) drug therapy: Secondary | ICD-10-CM | POA: Insufficient documentation

## 2020-02-27 DIAGNOSIS — D509 Iron deficiency anemia, unspecified: Secondary | ICD-10-CM | POA: Insufficient documentation

## 2020-02-27 LAB — CMP (CANCER CENTER ONLY)
ALT: 36 U/L (ref 0–44)
AST: 71 U/L — ABNORMAL HIGH (ref 15–41)
Albumin: 2.8 g/dL — ABNORMAL LOW (ref 3.5–5.0)
Alkaline Phosphatase: 204 U/L — ABNORMAL HIGH (ref 38–126)
Anion gap: 5 (ref 5–15)
BUN: 11 mg/dL (ref 8–23)
CO2: 27 mmol/L (ref 22–32)
Calcium: 8.8 mg/dL — ABNORMAL LOW (ref 8.9–10.3)
Chloride: 106 mmol/L (ref 98–111)
Creatinine: 0.61 mg/dL (ref 0.61–1.24)
GFR, Estimated: >60 mL/min (ref >60)
Glucose, Bld: 114 mg/dL — ABNORMAL HIGH (ref 70–99)
Potassium: 4.2 mmol/L (ref 3.5–5.1)
Sodium: 138 mmol/L (ref 135–145)
Total Bilirubin: 1.3 mg/dL — ABNORMAL HIGH (ref 0.3–1.2)
Total Protein: 5.9 g/dL — ABNORMAL LOW (ref 6.5–8.1)

## 2020-02-27 LAB — CBC WITH DIFFERENTIAL (CANCER CENTER ONLY)
Abs Immature Granulocytes: 0.01 10*3/uL (ref 0.00–0.07)
Basophils Absolute: 0.1 10*3/uL (ref 0.0–0.1)
Basophils Relative: 1 %
Eosinophils Absolute: 0.1 10*3/uL (ref 0.0–0.5)
Eosinophils Relative: 2 %
HCT: 40.3 % (ref 39.0–52.0)
Hemoglobin: 12.5 g/dL — ABNORMAL LOW (ref 13.0–17.0)
Immature Granulocytes: 0 %
Lymphocytes Relative: 14 %
Lymphs Abs: 0.8 10*3/uL (ref 0.7–4.0)
MCH: 31.3 pg (ref 26.0–34.0)
MCHC: 31 g/dL (ref 30.0–36.0)
MCV: 101 fL — ABNORMAL HIGH (ref 80.0–100.0)
Monocytes Absolute: 1 10*3/uL (ref 0.1–1.0)
Monocytes Relative: 19 %
Neutro Abs: 3.4 10*3/uL (ref 1.7–7.7)
Neutrophils Relative %: 64 %
Platelet Count: 141 10*3/uL — ABNORMAL LOW (ref 150–400)
RBC: 3.99 MIL/uL — ABNORMAL LOW (ref 4.22–5.81)
RDW: 17.1 % — ABNORMAL HIGH (ref 11.5–15.5)
WBC Count: 5.3 10*3/uL (ref 4.0–10.5)
nRBC: 0 % (ref 0.0–0.2)

## 2020-02-27 MED ORDER — SODIUM CHLORIDE 0.9 % IV SOLN
1.0000 mg/kg | Freq: Once | INTRAVENOUS | Status: AC
  Administered 2020-02-27: 92 mg via INTRAVENOUS
  Filled 2020-02-27: qty 9.2

## 2020-02-27 MED ORDER — FAMOTIDINE IN NACL 20-0.9 MG/50ML-% IV SOLN
INTRAVENOUS | Status: AC
  Filled 2020-02-27: qty 50

## 2020-02-27 MED ORDER — DIPHENHYDRAMINE HCL 50 MG/ML IJ SOLN
25.0000 mg | Freq: Once | INTRAMUSCULAR | Status: AC
  Administered 2020-02-27: 25 mg via INTRAVENOUS

## 2020-02-27 MED ORDER — DIPHENHYDRAMINE HCL 50 MG/ML IJ SOLN
INTRAMUSCULAR | Status: AC
  Filled 2020-02-27: qty 1

## 2020-02-27 MED ORDER — SODIUM CHLORIDE 0.9 % IV SOLN
3.0000 mg/kg | Freq: Once | INTRAVENOUS | Status: AC
  Administered 2020-02-27: 250 mg via INTRAVENOUS
  Filled 2020-02-27: qty 10

## 2020-02-27 MED ORDER — DIPHENHYDRAMINE HCL 25 MG PO CAPS
ORAL_CAPSULE | ORAL | Status: AC
  Filled 2020-02-27: qty 1

## 2020-02-27 MED ORDER — SODIUM CHLORIDE 0.9 % IV SOLN
Freq: Once | INTRAVENOUS | Status: AC
  Filled 2020-02-27: qty 250

## 2020-02-27 MED ORDER — FAMOTIDINE IN NACL 20-0.9 MG/50ML-% IV SOLN
20.0000 mg | Freq: Once | INTRAVENOUS | Status: AC
  Administered 2020-02-27: 20 mg via INTRAVENOUS

## 2020-02-27 NOTE — Progress Notes (Signed)
Patient here to start new treatment. He is doing well without any questions or concerns.  Oncology Nurse Navigator Documentation  Oncology Nurse Navigator Flowsheets 02/27/2020  Abnormal Finding Date -  Confirmed Diagnosis Date -  Diagnosis Status -  Planned Course of Treatment -  Phase of Treatment -  Chemotherapy Actual Start Date: -  Chemo/Radiation Concurrent Pending- Reason: -  Radiation Actual Start Date: -  Navigator Follow Up Date: 03/19/2020  Navigator Follow Up Reason: Follow-up Appointment;Chemotherapy  Navigator Location [facility]  Referral Date to RadOnc/MedOnc -  Navigator Encounter Type Treatment  Telephone -  Treatment Initiated Date -  Patient Visit Type MedOnc  Treatment Phase Active Tx  Barriers/Navigation Needs Anxiety;Mobility Issues;Morbidities/Frailty;Pain  Education Preparing for Upcoming Surgery/ Treatment  Interventions Psycho-Social Support  Acuity Level 2-Minimal Needs (1-2 Barriers Identified)  Referrals -  Coordination of Care -  Education Method Verbal  Support Groups/Services Friends and Family  Time Spent with Patient 30

## 2020-02-27 NOTE — Patient Instructions (Signed)
Ipilimumab injection What is this medicine? IPILIMUMAB (IP i LIM ue mab) is a monoclonal antibody. It is used to treat colorectal cancer, kidney cancer, liver cancer, lung cancer, melanoma, and mesothelioma. This medicine may be used for other purposes; ask your health care provider or pharmacist if you have questions. COMMON BRAND NAME(S): YERVOY What should I tell my health care provider before I take this medicine? They need to know if you have any of these conditions:  Addison's disease  blood in your stools (black or tarry stools) or if you have blood in your vomit  eye disease, vision problems  history of pancreatitis  history of stomach bleeding  immune system problems  inflammatory bowel disease  kidney disease  liver disease  lupus  myasthenia gravis  organ transplant  rheumatoid arthritis  sarcoidosis  stomach or intestine problems  thyroid disease  tingling of the fingers or toes, or other nerve disorder  an unusual or allergic reaction to ipilimumab, other medicines, foods, dyes, or preservatives  pregnant or trying to get pregnant  breast-feeding How should I use this medicine? This medicine is for infusion into a vein. It is given by a health care professional in a hospital or clinic setting. A special MedGuide will be given to you before each treatment. Be sure to read this information carefully each time. Talk to your pediatrician regarding the use of this medicine in children. While this drug may be prescribed for children as young as 12 years for selected conditions, precautions do apply. Overdosage: If you think you have taken too much of this medicine contact a poison control center or emergency room at once. NOTE: This medicine is only for you. Do not share this medicine with others. What if I miss a dose? It is important not to miss your dose. Call your doctor or health care professional if you are unable to keep an appointment. What may  interact with this medicine? Interactions are not expected. This list may not describe all possible interactions. Give your health care provider a list of all the medicines, herbs, non-prescription drugs, or dietary supplements you use. Also tell them if you smoke, drink alcohol, or use illegal drugs. Some items may interact with your medicine. What should I watch for while using this medicine? Tell your doctor or healthcare professional if your symptoms do not start to get better or if they get worse. Do not become pregnant while taking this medicine or for 3 months after stopping it. Women should inform their doctor if they wish to become pregnant or think they might be pregnant. There is a potential for serious side effects to an unborn child. Talk to your health care professional or pharmacist for more information. Do not breast-feed an infant while taking this medicine or for 3 months after the last dose. Your condition will be monitored carefully while you are receiving this medicine. You may need blood work done while you are taking this medicine. What side effects may I notice from receiving this medicine? Side effects that you should report to your doctor or health care professional as soon as possible:  allergic reactions like skin rash, itching or hives, swelling of the face, lips, or tongue  black, tarry stools  bloody or watery diarrhea  changes in vision  dizziness  eye pain  fast, irregular heartbeat  feeling anxious  feeling faint or lightheaded, falls  nausea, vomiting  pain, tingling, numbness in the hands or feet  redness, blistering, peeling or loosening   of the skin, including inside the mouth  signs and symptoms of liver injury like dark yellow or brown urine; general ill feeling or flu-like symptoms; light-colored stools; loss of appetite; nausea; right upper belly pain; unusually weak or tired; yellowing of the eyes or skin  unusual bleeding or  bruising Side effects that usually do not require medical attention (report to your doctor or health care professional if they continue or are bothersome):  headache  loss of appetite  trouble sleeping This list may not describe all possible side effects. Call your doctor for medical advice about side effects. You may report side effects to FDA at 1-800-FDA-1088. Where should I keep my medicine? This drug is given in a hospital or clinic and will not be stored at home. NOTE: This sheet is a summary. It may not cover all possible information. If you have questions about this medicine, talk to your doctor, pharmacist, or health care provider.  2020 Elsevier/Gold Standard (2018-11-30 10:56:55) Nivolumab injection What is this medicine? NIVOLUMAB (nye VOL ue mab) is a monoclonal antibody. It is used to treat colon cancer, esophageal cancer, head and neck cancer, Hodgkin lymphoma, kidney cancer, liver cancer, lung cancer, mesothelioma, melanoma, and urothelial cancer. This medicine may be used for other purposes; ask your health care provider or pharmacist if you have questions. COMMON BRAND NAME(S): Opdivo What should I tell my health care provider before I take this medicine? They need to know if you have any of these conditions:  diabetes  immune system problems  kidney disease  liver disease  lung disease  organ transplant  stomach or intestine problems  thyroid disease  an unusual or allergic reaction to nivolumab, other medicines, foods, dyes, or preservatives  pregnant or trying to get pregnant  breast-feeding How should I use this medicine? This medicine is for infusion into a vein. It is given by a health care professional in a hospital or clinic setting. A special MedGuide will be given to you before each treatment. Be sure to read this information carefully each time. Talk to your pediatrician regarding the use of this medicine in children. While this drug may be  prescribed for children as young as 12 years for selected conditions, precautions do apply. Overdosage: If you think you have taken too much of this medicine contact a poison control center or emergency room at once. NOTE: This medicine is only for you. Do not share this medicine with others. What if I miss a dose? It is important not to miss your dose. Call your doctor or health care professional if you are unable to keep an appointment. What may interact with this medicine? Interactions have not been studied. Give your health care provider a list of all the medicines, herbs, non-prescription drugs, or dietary supplements you use. Also tell them if you smoke, drink alcohol, or use illegal drugs. Some items may interact with your medicine. This list may not describe all possible interactions. Give your health care provider a list of all the medicines, herbs, non-prescription drugs, or dietary supplements you use. Also tell them if you smoke, drink alcohol, or use illegal drugs. Some items may interact with your medicine. What should I watch for while using this medicine? This drug may make you feel generally unwell. Continue your course of treatment even though you feel ill unless your doctor tells you to stop. You may need blood work done while you are taking this medicine. Do not become pregnant while taking this medicine or   for 5 months after stopping it. Women should inform their doctor if they wish to become pregnant or think they might be pregnant. There is a potential for serious side effects to an unborn child. Talk to your health care professional or pharmacist for more information. Do not breast-feed an infant while taking this medicine or for 5 months after stopping it. What side effects may I notice from receiving this medicine? Side effects that you should report to your doctor or health care professional as soon as possible:  allergic reactions like skin rash, itching or hives, swelling  of the face, lips, or tongue  breathing problems  blood in the urine  bloody or watery diarrhea or black, tarry stools  changes in emotions or moods  changes in vision  chest pain  cough  dizziness  feeling faint or lightheaded, falls  fever, chills  headache with fever, neck stiffness, confusion, loss of memory, sensitivity to light, hallucination, loss of contact with reality, or seizures  joint pain  mouth sores  redness, blistering, peeling or loosening of the skin, including inside the mouth  severe muscle pain or weakness  signs and symptoms of high blood sugar such as dizziness; dry mouth; dry skin; fruity breath; nausea; stomach pain; increased hunger or thirst; increased urination  signs and symptoms of kidney injury like trouble passing urine or change in the amount of urine  signs and symptoms of liver injury like dark yellow or brown urine; general ill feeling or flu-like symptoms; light-colored stools; loss of appetite; nausea; right upper belly pain; unusually weak or tired; yellowing of the eyes or skin  swelling of the ankles, feet, hands  trouble passing urine or change in the amount of urine  unusually weak or tired  weight gain or loss Side effects that usually do not require medical attention (report to your doctor or health care professional if they continue or are bothersome):  bone pain  constipation  decreased appetite  diarrhea  muscle pain  nausea, vomiting  tiredness This list may not describe all possible side effects. Call your doctor for medical advice about side effects. You may report side effects to FDA at 1-800-FDA-1088. Where should I keep my medicine? This drug is given in a hospital or clinic and will not be stored at home. NOTE: This sheet is a summary. It may not cover all possible information. If you have questions about this medicine, talk to your doctor, pharmacist, or health care provider.  2020 Elsevier/Gold  Standard (2018-11-30 10:04:50)  

## 2020-02-27 NOTE — Progress Notes (Signed)
Pt discharged in no apparent distress. Pt left ambulatory without assistance. Pt aware of discharge instructions and verbalized understanding and had no further questions.  

## 2020-03-07 ENCOUNTER — Telehealth: Payer: Self-pay

## 2020-03-07 NOTE — Telephone Encounter (Signed)
Volunteer called patient on behalf of Palliative Care. Patient is doing well at this time.  

## 2020-03-15 ENCOUNTER — Other Ambulatory Visit: Payer: Self-pay

## 2020-03-15 ENCOUNTER — Encounter: Payer: Self-pay | Admitting: Nurse Practitioner

## 2020-03-15 ENCOUNTER — Ambulatory Visit (INDEPENDENT_AMBULATORY_CARE_PROVIDER_SITE_OTHER): Payer: Medicare Other | Admitting: Nurse Practitioner

## 2020-03-15 VITALS — BP 104/70 | HR 88 | Ht 73.0 in | Wt 214.6 lb

## 2020-03-15 DIAGNOSIS — I10 Essential (primary) hypertension: Secondary | ICD-10-CM

## 2020-03-15 DIAGNOSIS — I4819 Other persistent atrial fibrillation: Secondary | ICD-10-CM

## 2020-03-15 NOTE — Progress Notes (Signed)
Electrophysiology Office Note Date: 03/15/2020  ID:  Yaseen, Gilberg [birth year], [name]  PCP: Vicenta Aly, FNP Primary Cardiologist: Harrington Challenger Electrophysiologist: Curt Bears  CC: AF follow up  Kerry Adams is a 65 y.o. male seen today for Dr Curt Bears.  He presents today for routine electrophysiology followup.  Since last being seen in our clinic, the patient reports doing reasonably well.  He continues with cancer treatment. He has had some anemia as well as non healing wounds and mouth bleeding. Heart rates at home are 80-110's.   He denies chest pain, palpitations, dyspnea, PND, orthopnea, dizziness, syncope, .  Past Medical History:  Diagnosis Date  . Abnormal EKG   . Alcohol use 02/18/2014  . Arthritis    osteo knees and hips   . Atrial fibrillation ([place])   . CAD (coronary artery disease)    WITHOUT ANGINA PECTORIS, UNSPECIFIED VESSEL OR LESION TYPE, UNSPECIFIED WHETHER NATIVE OR TRANSPLANTED HEART  . 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 Chronic liver disease   . Cirrhosis ([place])   . Closed right hip fracture ([place]) 02/18/2014  . Dizziness   . Dysrhythmia   . Elevated liver function tests   . GERD (gastroesophageal reflux disease)    treats with OTC Ranitidine  . Goals of care, counseling/discussion 06/20/2019  . Hepatocellular carcinoma metastatic to lung ([place]) 07/08/2019  . 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 Hip fracture requiring operative repair ([place]) 02/19/2014  . Hyperlipidemia   . Hypertension   . Iron deficiency anemia due to chronic blood loss 02/20/2020  . Metastasis to right adrenal gland of unknown origin ([place]) 06/20/2019  . 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 Osteoarthritis of knee, unilateral 03/01/2015  . Primary osteoarthritis of right knee 05/23/2015  . Thrombocytopenia ([place])    Past Surgical History:  Procedure  Laterality Date  . CARDIOVERSION N/A 09/13/2018   Procedure: CARDIOVERSION;  Surgeon: Acie Fredrickson Wonda Cheng, MD;  Location: [facility] ENDOSCOPY;  Service: Cardiovascular;  Laterality: N/A;  . FRACTURE SURGERY     right femur  . HERNIA REPAIR     umbilical  . INTERTROCHANTERIC HIP FRACTURE SURGERY Right 02/19/2014  . INTRAMEDULLARY (IM) NAIL INTERTROCHANTERIC Right 02/19/2014   Procedure: INTRAMEDULLARY (IM) NAIL INTERTROCHANTRIC;  Surgeon: [name], MD;  Location: [place] [facility];  Service: Orthopedics;  Laterality: Right;  . SHOULDER SURGERY Right   . TOTAL KNEE ARTHROPLASTY Left 03/01/2015   Procedure: TOTAL KNEE ARTHROPLASTY;  Surgeon: [name], MD;  Location: [place];  Service: Orthopedics;  Laterality: Left;  Left knee arthroplasty  . TOTAL KNEE ARTHROPLASTY Right 05/23/2015   Procedure: TOTAL KNEE ARTHROPLASTY W/HARDWARE REMOVAL FEMUR;  Surgeon: Frederik Pear, MD;  Location: [facility];  Service: Orthopedics;  Laterality: Right;    Current Outpatient Medications  Medication Sig Dispense Refill  . acetaminophen (TYLENOL) 500 MG tablet Take 500 mg by mouth every 8 (eight) hours as needed (pain).     Marland Kitchen amoxicillin (AMOXIL) 500 MG capsule Prior to dental appointments    . diazepam (VALIUM) 2 MG tablet Take 1 tablet (2 mg total) by mouth every 12 (twelve) hours as needed for anxiety. 14 tablet 0  . famotidine (PEPCID) 10 MG tablet Take 10 mg by mouth every morning.     . metoprolol tartrate (LOPRESSOR) 100 MG tablet Take 1 tablet (100 mg total) by mouth 2 (two) times daily. 180 tablet 3  .  Multiple Vitamin (MULTIVITAMIN WITH MINERALS) TABS tablet Take 1 tablet by mouth daily.    . ondansetron (ZOFRAN) 8 MG tablet Take by mouth every 8 (eight) hours as needed.    . polyethylene glycol powder (GLYCOLAX/MIRALAX) 17 GM/SCOOP powder Take 1 Container by mouth daily as needed.    . Potassium 99 MG TABS Take 99 mg by mouth daily.    . prochlorperazine (COMPAZINE) 10 MG tablet Take 10 mg by mouth every 6  (six) hours as needed for nausea or vomiting.    . rivaroxaban (XARELTO) 20 MG TABS tablet Take 1 tablet (20 mg total) by mouth daily with supper. 30 tablet 6  . traMADol (ULTRAM) 50 MG tablet TAKE 1 TABLET BY MOUTH EVERY 6 HOURS AS NEEDED 60 tablet 0  . trolamine salicylate (ASPERCREME) 10 % cream Apply 1 application topically as needed for muscle pain.     No current facility-administered medications for this visit.    Allergies:   Patient has no known allergies.   Social History: Social History   Socioeconomic History  . Marital status: Married    Spouse name: Not on file  . Number of children: Not on file  . Years of education: Not on file  . Highest education level: Not on file  Occupational History  . Not on file  Tobacco Use  . Smoking status: Never Smoker  . Smokeless tobacco: Never Used  Vaping Use  . Vaping Use: Never used  Substance and Sexual Activity  . Alcohol use: Yes    Alcohol/week: 32.0 standard drinks    Types: 32 Cans of beer per week    Comment: Zero since 08/25/19.  . Drug use: No  . Sexual activity: Not on file  Other Topics Concern  . Not on file  Social History Narrative  . Not on file   Social Determinants of Health   Financial Resource Strain: Not on file  Food Insecurity: Not on file  Transportation Needs: Not on file  Physical Activity: Not on file  Stress: Not on file  Social Connections: Not on file  Intimate Partner Violence: Not on file    Family History: Family History  Problem Relation Age of Onset  . Hypertension Mother   . Stroke Father   . Healthy Brother     Review of Systems: All other systems reviewed and are otherwise negative except as noted above.   Physical Exam: VS:  BP 104/70   Pulse 88   Ht 6\' 1"  (1.854 m)   Wt 214 lb 9.6 oz (97.3 kg)   SpO2 97%   BMI 28.31 kg/m  , BMI Body mass index is 28.31 kg/m. Wt Readings from Last 3 Encounters:  03/15/20 214 lb 9.6 oz (97.3 kg)  02/20/20 202 lb (91.6 kg)   01/27/20 195 lb (88.5 kg)    GEN- The patient is elderly and frail appearing, alert and oriented x 3 today.   HEENT: normocephalic, atraumatic; sclera clear, conjunctiva pink; hearing intact; oropharynx clear; neck supple Lungs- Clear to ausculation bilaterally, normal work of breathing.  No wheezes, rales, rhonchi Heart- Irregular rate and rhythm  GI- soft, non-tender, non-distended, bowel sounds present  Extremities- no clubbing, cyanosis, or edema  MS- no significant deformity or atrophy Skin- warm and dry, no rash or lesion  Psych- euthymic mood, full affect Neuro- strength and sensation are intact   EKG:  EKG is ordered today. The ekg ordered today shows atrial fibrillation, rate 88  Recent Labs: 08/25/2019: B Natriuretic  Peptide 633.0 08/30/2019: Magnesium 1.8 02/20/2020: TSH 1.594 02/27/2020: ALT 36; BUN 11; Creatinine 0.61; Hemoglobin 12.5; Platelet Count 141; Potassium 4.2; Sodium 138    Other studies Reviewed: Additional studies/ records that were reviewed today include: Dr Curt Bears office notes  Assessment and Plan:  1.  Persistent atrial fibrillation Rate control strategy planned per Dr Curt Bears last office visit Continue Xarelto for [place] followed by oncology  EKG today with adequate rate control His wife wanted to discuss Xarelto and possibility of stopping. We talked through options today. After shared decision making process, plan is to continue Xarelto for now.   2.  HTN Stable No change required today  4.  Hepatocellular carcinoma On chemo and radiation therapy   Current medicines are reviewed at length with the patient today.   The patient does not have concerns regarding his medicines.  The following changes were made today:  none  Labs/ tests ordered today include: none Orders Placed This Encounter  Procedures  . EKG 12-Lead     Disposition:   Follow up with Dr Curt Bears 6 months     Signed, Chanetta Marshall, NP 03/15/2020 10:43 AM    [address] [place] [address] [phone] (office) [phone] (fax)

## 2020-03-15 NOTE — Patient Instructions (Signed)
Medication Instructions:  *If you need a refill on your cardiac medications before your next appointment, please call your pharmacy*  Follow-Up: At [facility], you and your health needs are our priority.  As part of our continuing mission to provide you with exceptional heart care, we have created designated Provider Care Teams.  These Care Teams include your primary Cardiologist (physician) and Advanced Practice Providers (APPs -  Physician Assistants and Nurse Practitioners) who all work together to provide you with the care you need, when you need it.  We recommend signing up for the patient portal called "MyChart".  Sign up information is provided on this After Visit Summary.  MyChart is used to connect with patients for Virtual Visits (Telemedicine).  Patients are able to view lab/test results, encounter notes, upcoming appointments, etc.  Non-urgent messages can be sent to your provider as well.   To learn more about what you can do with MyChart, go to [email or web address].    Your next appointment:   Your physician wants you to follow-up in: 6 MONTHS with Dr. Camnitz. You will receive a reminder letter in the mail two months in advance. If you don't receive a letter, please call our office to schedule the follow-up appointment.  The format for your next appointment:   In Person with Dr. Camnitz     

## 2020-03-19 ENCOUNTER — Telehealth: Payer: Self-pay | Admitting: Hematology & Oncology

## 2020-03-19 ENCOUNTER — Other Ambulatory Visit: Payer: Self-pay

## 2020-03-19 ENCOUNTER — Inpatient Hospital Stay: Payer: Medicare Other

## 2020-03-19 ENCOUNTER — Inpatient Hospital Stay (HOSPITAL_BASED_OUTPATIENT_CLINIC_OR_DEPARTMENT_OTHER): Payer: Medicare Other | Admitting: Hematology & Oncology

## 2020-03-19 ENCOUNTER — Encounter: Payer: Self-pay | Admitting: Hematology & Oncology

## 2020-03-19 VITALS — BP 106/77 | HR 89 | Temp 97.7°F | Resp 19 | Wt 214.0 lb

## 2020-03-19 DIAGNOSIS — C220 Liver cell carcinoma: Secondary | ICD-10-CM

## 2020-03-19 DIAGNOSIS — C7801 Secondary malignant neoplasm of right lung: Secondary | ICD-10-CM

## 2020-03-19 DIAGNOSIS — C7800 Secondary malignant neoplasm of unspecified lung: Secondary | ICD-10-CM

## 2020-03-19 DIAGNOSIS — Z5112 Encounter for antineoplastic immunotherapy: Secondary | ICD-10-CM | POA: Diagnosis not present

## 2020-03-19 LAB — CMP (CANCER CENTER ONLY)
ALT: 38 U/L (ref 0–44)
AST: 90 U/L — ABNORMAL HIGH (ref 15–41)
Albumin: 2.7 g/dL — ABNORMAL LOW (ref 3.5–5.0)
Alkaline Phosphatase: 209 U/L — ABNORMAL HIGH (ref 38–126)
Anion gap: 4 — ABNORMAL LOW (ref 5–15)
BUN: 11 mg/dL (ref 8–23)
CO2: 27 mmol/L (ref 22–32)
Calcium: 8.8 mg/dL — ABNORMAL LOW (ref 8.9–10.3)
Chloride: 108 mmol/L (ref 98–111)
Creatinine: 0.58 mg/dL — ABNORMAL LOW (ref 0.61–1.24)
GFR, Estimated: >60 mL/min (ref >60)
Glucose, Bld: 131 mg/dL — ABNORMAL HIGH (ref 70–99)
Potassium: 4.2 mmol/L (ref 3.5–5.1)
Sodium: 139 mmol/L (ref 135–145)
Total Bilirubin: 1.3 mg/dL — ABNORMAL HIGH (ref 0.3–1.2)
Total Protein: 5.8 g/dL — ABNORMAL LOW (ref 6.5–8.1)

## 2020-03-19 LAB — TSH: TSH: 1.571 u[IU]/mL (ref 0.320–4.118)

## 2020-03-19 LAB — CBC WITH DIFFERENTIAL (CANCER CENTER ONLY)
Abs Immature Granulocytes: 0.01 10*3/uL (ref 0.00–0.07)
Basophils Absolute: 0 10*3/uL (ref 0.0–0.1)
Basophils Relative: 1 %
Eosinophils Absolute: 0 10*3/uL (ref 0.0–0.5)
Eosinophils Relative: 0 %
HCT: 38.6 % — ABNORMAL LOW (ref 39.0–52.0)
Hemoglobin: 12.4 g/dL — ABNORMAL LOW (ref 13.0–17.0)
Immature Granulocytes: 0 %
Lymphocytes Relative: 22 %
Lymphs Abs: 0.8 10*3/uL (ref 0.7–4.0)
MCH: 32.5 pg (ref 26.0–34.0)
MCHC: 32.1 g/dL (ref 30.0–36.0)
MCV: 101 fL — ABNORMAL HIGH (ref 80.0–100.0)
Monocytes Absolute: 0.6 10*3/uL (ref 0.1–1.0)
Monocytes Relative: 17 %
Neutro Abs: 2.2 10*3/uL (ref 1.7–7.7)
Neutrophils Relative %: 60 %
Platelet Count: 115 10*3/uL — ABNORMAL LOW (ref 150–400)
RBC: 3.82 MIL/uL — ABNORMAL LOW (ref 4.22–5.81)
RDW: 19.8 % — ABNORMAL HIGH (ref 11.5–15.5)
WBC Count: 3.6 10*3/uL — ABNORMAL LOW (ref 4.0–10.5)
nRBC: 0 % (ref 0.0–0.2)

## 2020-03-19 LAB — IRON AND TIBC
Iron: 89 ug/dL (ref 42–163)
Saturation Ratios: 34 % (ref 20–55)
TIBC: 259 ug/dL (ref 202–409)
UIBC: 170 ug/dL (ref 117–376)

## 2020-03-19 LAB — LACTATE DEHYDROGENASE: LDH: 198 U/L — ABNORMAL HIGH (ref 98–192)

## 2020-03-19 LAB — FERRITIN: Ferritin: 242 ng/mL (ref 24–336)

## 2020-03-19 MED ORDER — FAMOTIDINE IN NACL 20-0.9 MG/50ML-% IV SOLN
20.0000 mg | Freq: Once | INTRAVENOUS | Status: AC
  Administered 2020-03-19: 20 mg via INTRAVENOUS

## 2020-03-19 MED ORDER — DIPHENHYDRAMINE HCL 50 MG/ML IJ SOLN
25.0000 mg | Freq: Once | INTRAMUSCULAR | Status: AC
  Administered 2020-03-19: 25 mg via INTRAVENOUS

## 2020-03-19 MED ORDER — SODIUM CHLORIDE 0.9 % IV SOLN
Freq: Once | INTRAVENOUS | Status: AC
  Filled 2020-03-19: qty 250

## 2020-03-19 MED ORDER — DIPHENHYDRAMINE HCL 50 MG/ML IJ SOLN
INTRAMUSCULAR | Status: AC
  Filled 2020-03-19: qty 1

## 2020-03-19 MED ORDER — SODIUM CHLORIDE 0.9 % IV SOLN
3.0000 mg/kg | Freq: Once | INTRAVENOUS | Status: AC
  Administered 2020-03-19: 250 mg via INTRAVENOUS
  Filled 2020-03-19: qty 40

## 2020-03-19 MED ORDER — PALONOSETRON HCL INJECTION 0.25 MG/5ML
INTRAVENOUS | Status: AC
  Filled 2020-03-19: qty 5

## 2020-03-19 MED ORDER — SODIUM CHLORIDE 0.9 % IV SOLN
100.0000 mg | Freq: Once | INTRAVENOUS | Status: AC
  Administered 2020-03-19: 100 mg via INTRAVENOUS
  Filled 2020-03-19: qty 10

## 2020-03-19 MED ORDER — FAMOTIDINE IN NACL 20-0.9 MG/50ML-% IV SOLN
INTRAVENOUS | Status: AC
  Filled 2020-03-19: qty 50

## 2020-03-19 NOTE — Progress Notes (Signed)
Hematology and Oncology Follow Up Visit  Kerry Adams [id number] 01-Jan-1956 65 y.o. 03/19/2020   Principle Diagnosis:  Hepatocellular carcinoma -- mets to bone, lung, lymph nodes Iron def anemia  Current Therapy:        Palliative Radiation to the RIGHT femur/ chest wall Tecentriq/Avastin -- started 08/05/2019, s/p cycle #9  -- d/c on 02/20/2020 due to progressive cancer Nivolumab/Yervoy -- s/p cycle #1-- start on 02/27/2020 Xgeva 120 mg sq q 3 months -- next dose in 04/2020 Feraheme -- dose given on 02/20/2020   Interim History:  Kerry Adams is here today with his wife.  We changed therapy around a little bit because of the cancer progression.  He now is on double immunotherapy.  Hopefully, we will see a response.  It may take a little bit for response to happen.  He tolerated the first cycle quite nicely.  He did not have any diarrhea.  He had no rashes.  He had no nausea or vomiting.  He had no problems with cough.  He has little bit of discomfort in the upper right anterior chest wall.  I am sure this is where there is a tumor.  He has had no leg swelling.  He has had no problems with headaches.  There is been no urinary issues.  His last alpha-fetoprotein was 420.  Again, it would not surprise me this is a bit higher.  With immunotherapy, it just may take a little bit before we see her response.  He does have a low albumin.  I suspect he has an element of cirrhosis.  This is probably from past "indulgances."   We will  Medications:  Allergies as of 03/19/2020   No Known Allergies     Medication List       Accurate as of March 19, 2020  8:24 AM. If you have any questions, ask your nurse or doctor.        acetaminophen 500 MG tablet Commonly known as: TYLENOL Take 500 mg by mouth every 8 (eight) hours as needed (pain).   amoxicillin 500 MG capsule Commonly known as: AMOXIL Prior to dental appointments   diazepam 2 MG tablet Commonly known as: VALIUM Take 1  tablet (2 mg total) by mouth every 12 (twelve) hours as needed for anxiety.   famotidine 10 MG tablet Commonly known as: PEPCID Take 10 mg by mouth every morning.   metoprolol tartrate 100 MG tablet Commonly known as: LOPRESSOR Take 1 tablet (100 mg total) by mouth 2 (two) times daily.   multivitamin with minerals Tabs tablet Take 1 tablet by mouth daily.   ondansetron 8 MG tablet Commonly known as: ZOFRAN Take by mouth every 8 (eight) hours as needed.   polyethylene glycol powder 17 GM/SCOOP powder Commonly known as: GLYCOLAX/MIRALAX Take 1 Container by mouth daily as needed.   Potassium 99 MG Tabs Take 99 mg by mouth daily.   prochlorperazine 10 MG tablet Commonly known as: COMPAZINE Take 10 mg by mouth every 6 (six) hours as needed for nausea or vomiting.   rivaroxaban 20 MG Tabs tablet Commonly known as: Xarelto Take 1 tablet (20 mg total) by mouth daily with supper.   traMADol 50 MG tablet Commonly known as: ULTRAM TAKE 1 TABLET BY MOUTH EVERY 6 HOURS AS NEEDED   trolamine salicylate 10 % cream Commonly known as: ASPERCREME Apply 1 application topically as needed for muscle pain.       Allergies: No Known Allergies  Past Medical History,  Surgical history, Social history, and Family History were reviewed and updated.  Review of Systems: Review of Systems  Constitutional: Negative.   HENT: Negative.   Eyes: Negative.   Respiratory: Negative.   Cardiovascular: Negative.   Gastrointestinal: Negative.   Genitourinary: Negative.   Musculoskeletal: Positive for back pain, falls and joint pain.  Skin: Negative.   Neurological: Positive for focal weakness.  Endo/Heme/Allergies: Negative.   Psychiatric/Behavioral: Negative.      Physical Exam:  vitals were not taken for this visit.   Wt Readings from Last 3 Encounters:  03/15/20 214 lb 9.6 oz (97.3 kg)  02/20/20 202 lb (91.6 kg)  01/27/20 195 lb (88.5 kg)    Physical Exam Vitals reviewed.  HENT:      Head: Normocephalic and atraumatic.  Eyes:     Pupils: Pupils are equal, round, and reactive to light.  Cardiovascular:     Rate and Rhythm: Normal rate and regular rhythm.     Heart sounds: Normal heart sounds.  Pulmonary:     Effort: Pulmonary effort is normal.     Breath sounds: Normal breath sounds.  Abdominal:     General: Bowel sounds are normal.     Palpations: Abdomen is soft.  Musculoskeletal:        General: No tenderness or deformity. Normal range of motion.     Cervical back: Normal range of motion.  Lymphadenopathy:     Cervical: No cervical adenopathy.  Skin:    General: Skin is warm and dry.     Findings: No erythema or rash.  Neurological:     Mental Status: He is alert and oriented to person, place, and time.  Psychiatric:        Behavior: Behavior normal.        Thought Content: Thought content normal.        Judgment: Judgment normal.    Lab Results  Component Value Date   WBC 3.6 (L) 03/19/2020   HGB 12.4 (L) 03/19/2020   HCT 38.6 (L) 03/19/2020   MCV 101.0 (H) 03/19/2020   PLT 115 (L) 03/19/2020   Lab Results  Component Value Date   FERRITIN 150 01/27/2020   IRON 45 01/27/2020   TIBC 358 01/27/2020   UIBC 312 01/27/2020   IRONPCTSAT 13 (L) 01/27/2020   Lab Results  Component Value Date   RETICCTPCT 1.9 01/27/2020   RBC 3.82 (L) 03/19/2020   Lab Results  Component Value Date   KPAFRELGTCHN 23.3 (H) 06/20/2019   LAMBDASER 19.9 06/20/2019   KAPLAMBRATIO 1.17 06/20/2019   Lab Results  Component Value Date   IGGSERUM 1,078 06/20/2019   IGA 457 (H) 06/20/2019   IGMSERUM 80 06/20/2019   Lab Results  Component Value Date   TOTALPROTELP 6.2 06/20/2019   ALBUMINELP 3.2 06/20/2019   A1GS 0.3 06/20/2019   A2GS 0.6 06/20/2019   BETS 1.2 06/20/2019   GAMS 0.9 06/20/2019   MSPIKE Not Observed 06/20/2019     Chemistry      Component Value Date/Time   NA 138 02/27/2020 0756   NA 137 05/13/2019 1110   K 4.2 02/27/2020 0756   CL 106  02/27/2020 0756   CO2 27 02/27/2020 0756   BUN 11 02/27/2020 0756   BUN 7 (L) 05/13/2019 1110   CREATININE 0.61 02/27/2020 0756      Component Value Date/Time   CALCIUM 8.8 (L) 02/27/2020 0756   ALKPHOS 204 (H) 02/27/2020 0756   AST 71 (H) 02/27/2020 0756   ALT  36 02/27/2020 0756   BILITOT 1.3 (H) 02/27/2020 0756      Impression and Plan: Mr. Nikirk is a very pleasant 65 yo caucasian gentleman with metastatic hepatocellular carcinoma.   I am glad that he tolerated immunotherapy so far.  I told him that he could always have problems with immunotherapy at any time.  We will go ahead and plan for cycle 2 of treatment.  We will get him back in 3 more weeks.    Volanda Napoleon, MD 1/24/20228:24 AM

## 2020-03-19 NOTE — Telephone Encounter (Signed)
Appointments scheduled as requested by 1/24 los. Patient to get updated AVS from infusion at today's visit

## 2020-03-19 NOTE — Patient Instructions (Addendum)
[facility] Discharge Instructions for Patients Receiving Chemotherapy  Today you received the following chemotherapy agents Opdivo and Yervoy  To help prevent nausea and vomiting after your treatment, we encourage you to take your nausea medication as prescribed by MD.   If you develop nausea and vomiting that is not controlled by your nausea medication, call the clinic.   BELOW ARE SYMPTOMS THAT SHOULD BE REPORTED IMMEDIATELY:  *FEVER GREATER THAN 100.5 F  *CHILLS WITH OR WITHOUT FEVER  NAUSEA AND VOMITING THAT IS NOT CONTROLLED WITH YOUR NAUSEA MEDICATION  *UNUSUAL SHORTNESS OF BREATH  *UNUSUAL BRUISING OR BLEEDING  TENDERNESS IN MOUTH AND THROAT WITH OR WITHOUT PRESENCE OF ULCERS  *URINARY PROBLEMS  *BOWEL PROBLEMS  UNUSUAL RASH Items with * indicate a potential emergency and should be followed up as soon as possible.  Feel free to call the clinic should you have any questions or concerns. The clinic phone number is [phone].  Please show the CHEMO ALERT CARD at check-in to the Emergency Department and triage nurse.  Dexamethasone injection What is this medicine? DEXAMETHASONE (dex a METH a sone) is a corticosteroid. It is used to treat inflammation of the skin, joints, lungs, and other organs. Common conditions treated include asthma, allergies, and arthritis. It is also used for other conditions, like blood disorders and diseases of the adrenal glands. This medicine may be used for other purposes; ask your health care provider or pharmacist if you have questions. COMMON BRAND NAME(S): Decadron, DoubleDex, ReadySharp Dexamethasone, Simplist Dexamethasone, Solurex What should I tell my health care provider before I take this medicine? They need to know if you have any of these conditions:  Cushing's syndrome  diabetes  glaucoma  heart disease  high blood pressure  infection like herpes, measles, tuberculosis, or chickenpox  kidney  disease  liver disease  mental illness  myasthenia gravis  osteoporosis  previous heart attack  seizures  stomach or intestine problems  thyroid disease  an unusual or allergic reaction to dexamethasone, corticosteroids, other medicines, lactose, foods, dyes, or preservatives  pregnant or trying to get pregnant  breast-feeding How should I use this medicine? This medicine is for injection into a muscle, joint, lesion, soft tissue, or vein. It is given by a health care professional in a hospital or clinic setting. Talk to your pediatrician regarding the use of this medicine in children. Special care may be needed. Overdosage: If you think you have taken too much of this medicine contact a poison control center or emergency room at once. NOTE: This medicine is only for you. Do not share this medicine with others. What if I miss a dose? This may not apply. If you are having a series of injections over a prolonged period, try not to miss an appointment. Call your doctor or health care professional to reschedule if you are unable to keep an appointment. What may interact with this medicine? Do not take this medicine with any of the following medications:  live virus vaccines This medicine may also interact with the following medications:  aminoglutethimide  amphotericin B  aspirin and aspirin-like medicines  certain antibiotics like erythromycin, clarithromycin, and troleandomycin  certain antivirals for HIV or hepatitis  certain medicines for seizures like carbamazepine, phenobarbital, phenytoin  certain medicines to treat myasthenia gravis  cholestyramine  cyclosporine  digoxin  diuretics  ephedrine  male hormones, like estrogen or progestins and birth control pills  insulin or other medicines for diabetes  isoniazid  ketoconazole  medicines   that relax muscles for surgery  mifepristone  NSAIDs, medicines for pain and inflammation, like ibuprofen  or naproxen  rifampin  skin tests for allergies  thalidomide  vaccines  warfarin This list may not describe all possible interactions. Give your health care provider a list of all the medicines, herbs, non-prescription drugs, or dietary supplements you use. Also tell them if you smoke, drink alcohol, or use illegal drugs. Some items may interact with your medicine. What should I watch for while using this medicine? Visit your health care professional for regular checks on your progress. Tell your health care professional if your symptoms do not start to get better or if they get worse. Your condition will be monitored carefully while you are receiving this medicine. Wear a medical ID bracelet or chain. Carry a card that describes your disease and details of your medicine and dosage times. This medicine may increase your risk of getting an infection. Call your health care professional for advice if you get a fever, chills, or sore throat, or other symptoms of a cold or flu. Do not treat yourself. Try to avoid being around people who are sick. Call your health care professional if you are around anyone with measles, chickenpox, or if you develop sores or blisters that do not heal properly. If you are going to need surgery or other procedures, tell your doctor or health care professional that you have taken this medicine within the last 12 months. Ask your doctor or health care professional about your diet. You may need to lower the amount of salt you eat. This medicine may increase blood sugar. Ask your healthcare provider if changes in diet or medicines are needed if you have diabetes. What side effects may I notice from receiving this medicine? Side effects that you should report to your doctor or health care professional as soon as possible:  allergic reactions like skin rash, itching or hives, swelling of the face, lips, or tongue  bloody or black, tarry stools  changes in emotions or  moods  changes in vision  confusion, excitement, restlessness  depressed mood  eye pain  hallucinations  muscle weakness  severe or sudden stomach or belly pain  signs and symptoms of high blood sugar such as being more thirsty or hungry or having to urinate more than normal. You may also feel very tired or have blurry vision.  signs and symptoms of infection like fever; chills; cough; sore throat; pain or trouble passing urine  swelling of ankles, feet  unusual bruising or bleeding  wounds that do not heal Side effects that usually do not require medical attention (report to your doctor or health care professional if they continue or are bothersome):  increased appetite  increased growth of face or body hair  headache  nausea, vomiting  pain, redness, or irritation at site where injected  skin problems, acne, thin and shiny skin  trouble sleeping  weight gain This list may not describe all possible side effects. Call your doctor for medical advice about side effects. You may report side effects to FDA at 1-800-FDA-1088. Where should I keep my medicine? This medicine is given in a hospital or clinic and will not be stored at home. NOTE: This sheet is a summary. It may not cover all possible information. If you have questions about this medicine, talk to your doctor, pharmacist, or health care provider.  2021 Elsevier/Gold Standard (2018-08-24 13:51:58) Ipilimumab injection What is this medicine? IPILIMUMAB (IP i LIM ue mab)   is a monoclonal antibody. It is used to treat colorectal cancer, kidney cancer, liver cancer, lung cancer, melanoma, and mesothelioma. This medicine may be used for other purposes; ask your health care provider or pharmacist if you have questions. COMMON BRAND NAME(S): YERVOY What should I tell my health care provider before I take this medicine? They need to know if you have any of these conditions:  autoimmune diseases like Crohn's  disease, ulcerative colitis, or lupus  have had or planning to have an allogeneic stem cell transplant (uses someone else's stem cells)  history of organ transplant  nervous system problems like myasthenia gravis or Guillain-Barre syndrome  an unusual or allergic reaction to ipilimumab, other medicines, foods, dyes, or preservatives  pregnant or trying to get pregnant  breast-feeding How should I use this medicine? This medicine is for infusion into a vein. It is given by a health care professional in a hospital or clinic setting. A special MedGuide will be given to you before each treatment. Be sure to read this information carefully each time. Talk to your pediatrician regarding the use of this medicine in children. While this drug may be prescribed for children as young as 12 years for selected conditions, precautions do apply. Overdosage: If you think you have taken too much of this medicine contact a poison control center or emergency room at once. NOTE: This medicine is only for you. Do not share this medicine with others. What if I miss a dose? It is important not to miss your dose. Call your doctor or health care professional if you are unable to keep an appointment. What may interact with this medicine? Interactions are not expected. This list may not describe all possible interactions. Give your health care provider a list of all the medicines, herbs, non-prescription drugs, or dietary supplements you use. Also tell them if you smoke, drink alcohol, or use illegal drugs. Some items may interact with your medicine. What should I watch for while using this medicine? Tell your doctor or healthcare professional if your symptoms do not start to get better or if they get worse. Do not become pregnant while taking this medicine or for 3 months after stopping it. Women should inform their doctor if they wish to become pregnant or think they might be pregnant. There is a potential for  serious side effects to an unborn child. Talk to your health care professional or pharmacist for more information. Do not breast-feed an infant while taking this medicine or for 3 months after the last dose. Your condition will be monitored carefully while you are receiving this medicine. You may need blood work done while you are taking this medicine. What side effects may I notice from receiving this medicine? Side effects that you should report to your doctor or health care professional as soon as possible:  allergic reactions like skin rash, itching or hives, swelling of the face, lips, or tongue  black, tarry stools  bloody or watery diarrhea  changes in vision  dizziness  eye pain  fast, irregular heartbeat  feeling anxious  feeling faint or lightheaded, falls  nausea, vomiting  pain, tingling, numbness in the hands or feet  redness, blistering, peeling or loosening of the skin, including inside the mouth  signs and symptoms of liver injury like dark yellow or brown urine; general ill feeling or flu-like symptoms; light-colored stools; loss of appetite; nausea; right upper belly pain; unusually weak or tired; yellowing of the eyes or skin  unusual bleeding   or bruising Side effects that usually do not require medical attention (report to your doctor or health care professional if they continue or are bothersome):  headache  loss of appetite  trouble sleeping This list may not describe all possible side effects. Call your doctor for medical advice about side effects. You may report side effects to FDA at 1-800-FDA-1088. Where should I keep my medicine? This drug is given in a hospital or clinic and will not be stored at home. NOTE: This sheet is a summary. It may not cover all possible information. If you have questions about this medicine, talk to your doctor, pharmacist, or health care provider.  2021 Elsevier/Gold Standard (2019-01-12 18:53:00) Nivolumab  injection What is this medicine? NIVOLUMAB (nye VOL ue mab) is a monoclonal antibody. It treats certain types of cancer. Some of the cancers treated are colon cancer, head and neck cancer, Hodgkin lymphoma, lung cancer, and melanoma. This medicine may be used for other purposes; ask your health care provider or pharmacist if you have questions. COMMON BRAND NAME(S): Opdivo What should I tell my health care provider before I take this medicine? They need to know if you have any of these conditions:  autoimmune diseases like Crohn's disease, ulcerative colitis, or lupus  have had or planning to have an allogeneic stem cell transplant (uses someone else's stem cells)  history of chest radiation  history of organ transplant  nervous system problems like myasthenia gravis or Guillain-Barre syndrome  an unusual or allergic reaction to nivolumab, other medicines, foods, dyes, or preservatives  pregnant or trying to get pregnant  breast-feeding How should I use this medicine? This medicine is for infusion into a vein. It is given by a health care professional in a hospital or clinic setting. A special MedGuide will be given to you before each treatment. Be sure to read this information carefully each time. Talk to your pediatrician regarding the use of this medicine in children. While this drug may be prescribed for children as young as 12 years for selected conditions, precautions do apply. Overdosage: If you think you have taken too much of this medicine contact a poison control center or emergency room at once. NOTE: This medicine is only for you. Do not share this medicine with others. What if I miss a dose? It is important not to miss your dose. Call your doctor or health care professional if you are unable to keep an appointment. What may interact with this medicine? Interactions have not been studied. This list may not describe all possible interactions. Give your health care provider  a list of all the medicines, herbs, non-prescription drugs, or dietary supplements you use. Also tell them if you smoke, drink alcohol, or use illegal drugs. Some items may interact with your medicine. What should I watch for while using this medicine? This drug may make you feel generally unwell. Continue your course of treatment even though you feel ill unless your doctor tells you to stop. You may need blood work done while you are taking this medicine. Do not become pregnant while taking this medicine or for 5 months after stopping it. Women should inform their doctor if they wish to become pregnant or think they might be pregnant. There is a potential for serious side effects to an unborn child. Talk to your health care professional or pharmacist for more information. Do not breast-feed an infant while taking this medicine or for 5 months after stopping it. What side effects may I notice   from receiving this medicine? Side effects that you should report to your doctor or health care professional as soon as possible:  allergic reactions like skin rash, itching or hives, swelling of the face, lips, or tongue  breathing problems  blood in the urine  bloody or watery diarrhea or black, tarry stools  changes in emotions or moods  changes in vision  chest pain  cough  dizziness  feeling faint or lightheaded, falls  fever, chills  headache with fever, neck stiffness, confusion, loss of memory, sensitivity to light, hallucination, loss of contact with reality, or seizures  joint pain  mouth sores  redness, blistering, peeling or loosening of the skin, including inside the mouth  severe muscle pain or weakness  signs and symptoms of high blood sugar such as dizziness; dry mouth; dry skin; fruity breath; nausea; stomach pain; increased hunger or thirst; increased urination  signs and symptoms of kidney injury like trouble passing urine or change in the amount of urine  signs and  symptoms of liver injury like dark yellow or brown urine; general ill feeling or flu-like symptoms; light-colored stools; loss of appetite; nausea; right upper belly pain; unusually weak or tired; yellowing of the eyes or skin  swelling of the ankles, feet, hands  trouble passing urine or change in the amount of urine  unusually weak or tired  weight gain or loss Side effects that usually do not require medical attention (report to your doctor or health care professional if they continue or are bothersome):  bone pain  constipation  decreased appetite  diarrhea  muscle pain  nausea, vomiting  tiredness This list may not describe all possible side effects. Call your doctor for medical advice about side effects. You may report side effects to FDA at 1-800-FDA-1088. Where should I keep my medicine? This drug is given in a hospital or clinic and will not be stored at home. NOTE: This sheet is a summary. It may not cover all possible information. If you have questions about this medicine, talk to your doctor, pharmacist, or health care provider.  2021 Elsevier/Gold Standard (2019-06-15 10:08:25) Diphenhydramine injection What is this medicine? DIPHENHYDRAMINE (dye fen HYE dra meen) is an antihistamine. It is used to treat the symptoms of an allergic reaction and motion sickness. It is also used to treat Parkinson's disease. This medicine may be used for other purposes; ask your health care provider or pharmacist if you have questions. COMMON BRAND NAME(S): Benadryl What should I tell my health care provider before I take this medicine? They need to know if you have any of these conditions:  asthma or lung disease  glaucoma  high blood pressure or heart disease  liver disease  pain or difficulty passing urine  prostate trouble  ulcers or other stomach problems  an unusual or allergic reaction to diphenhydramine, antihistamines, other medicines foods, dyes, or  preservatives  pregnant or trying to get pregnant  breast-feeding How should I use this medicine? This medicine is for injection into a vein or a muscle. It is usually given by a health care professional in a hospital or clinic setting. If you get this medicine at home, you will be taught how to prepare and give this medicine. Use exactly as directed. Take your medicine at regular intervals. Do not take your medicine more often than directed. It is important that you put your used needles and syringes in a special sharps container. Do not put them in a trash can. If you do   not have a sharps container, call your pharmacist or healthcare provider to get one. Talk to your pediatrician regarding the use of this medicine in children. While this drug may be prescribed for selected conditions, precautions do apply. This medicine is not approved for use in newborns and premature babies. Patients over 60 years old may have a stronger reaction and need a smaller dose. Overdosage: If you think you have taken too much of this medicine contact a poison control center or emergency room at once. NOTE: This medicine is only for you. Do not share this medicine with others. What if I miss a dose? If you miss a dose, take it as soon as you can. If it is almost time for your next dose, take only that dose. Do not take double or extra doses. What may interact with this medicine? Do not take this medicine with any of the following medications:  MAOIs like Carbex, Eldepryl, Marplan, Nardil, and Parnate This medicine may also interact with the following medications:  alcohol  barbiturates, like phenobarbital  medicines for bladder spasm like oxybutynin, tolterodine  medicines for blood pressure  medicines for depression, anxiety, or psychotic disturbances  medicines for movement abnormalities or Parkinson's disease  medicines for sleep  other medicines for cold, cough or allergy  some medicines for the  stomach like chlordiazepoxide, dicyclomine This list may not describe all possible interactions. Give your health care provider a list of all the medicines, herbs, non-prescription drugs, or dietary supplements you use. Also tell them if you smoke, drink alcohol, or use illegal drugs. Some items may interact with your medicine. What should I watch for while using this medicine? Your condition will be monitored carefully while you are receiving this medicine. Tell your doctor or healthcare professional if your symptoms do not start to get better or if they get worse. You may get drowsy or dizzy. Do not drive, use machinery, or do anything that needs mental alertness until you know how this medicine affects you. Do not stand or sit up quickly, especially if you are an older patient. This reduces the risk of dizzy or fainting spells. Alcohol may interfere with the effect of this medicine. Avoid alcoholic drinks. Your mouth may get dry. Chewing sugarless gum or sucking hard candy, and drinking plenty of water may help. Contact your doctor if the problem does not go away or is severe. What side effects may I notice from receiving this medicine? Side effects that you should report to your doctor or health care professional as soon as possible:  allergic reactions like skin rash, itching or hives, swelling of the face, lips, or tongue  breathing problems  changes in vision  chills  confused, agitated, nervous  irregular or fast heartbeat  low blood pressure  seizures  tremor  trouble passing urine  unusual bleeding or bruising  unusually weak or tired Side effects that usually do not require medical attention (report to your doctor or health care professional if they continue or are bothersome):  constipation, diarrhea  drowsy  headache  loss of appetite  stomach upset, vomiting  sweating  thick mucous This list may not describe all possible side effects. Call your doctor for  medical advice about side effects. You may report side effects to FDA at 1-800-FDA-1088. Where should I keep my medicine? Keep out of the reach of children. If you are using this medicine at home, you will be instructed on how to store this medicine. Throw away any unused   medicine after the expiration date on the label. NOTE: This sheet is a summary. It may not cover all possible information. If you have questions about this medicine, talk to your doctor, pharmacist, or health care provider.  2021 Elsevier/Gold Standard (2007-06-01 14:28:35) Famotidine injection What is this medicine? FAMOTIDINE (fa MOE ti deen) is a type of antihistamine that blocks the release of stomach acid. It is used to treat stomach or intestinal ulcers. It can relieve ulcer pain and discomfort, and the heartburn from acid reflux. This medicine may be used for other purposes; ask your health care provider or pharmacist if you have questions. COMMON BRAND NAME(S): Pepcid What should I tell my health care provider before I take this medicine? They need to know if you have any of these conditions:  kidney or liver disease  an unusual or allergic reaction to famotidine, other medicines, foods, dyes, or preservatives  pregnant or trying to get pregnant  breast-feeding How should I use this medicine? This medicine is for infusion into a vein. It is given by a health care professional in a hospital or clinic setting. Talk to your pediatrician regarding the use of this medicine in children. Special care may be needed. Overdosage: If you think you have taken too much of this medicine contact a poison control center or emergency room at once. NOTE: This medicine is only for you. Do not share this medicine with others. What if I miss a dose? This does not apply. What may interact with this medicine?  delavirdine  itraconazole  ketoconazole This list may not describe all possible interactions. Give your health care  provider a list of all the medicines, herbs, non-prescription drugs, or dietary supplements you use. Also tell them if you smoke, drink alcohol, or use illegal drugs. Some items may interact with your medicine. What should I watch for while using this medicine? Tell your doctor or health care professional if your condition does not start to get better or gets worse. Do not take with aspirin, ibuprofen, or other antiinflammatory medicines. These can aggravate your condition. Do not smoke cigarettes or drink alcohol. These increase irritation in your stomach and can increase the time it will take for ulcers to heal. Cigarettes and alcohol can also worsen acid reflux or heartburn. If you get black, tarry stools or vomit up what looks like coffee grounds, call your doctor or health care professional at once. You may have a bleeding ulcer. This medicine may cause a decrease in vitamin B12. You should make sure that you get enough vitamin B12 while you are taking this medicine. Discuss the foods you eat and the vitamins you take with your health care professional. What side effects may I notice from receiving this medicine? Side effects that you should report to your doctor or health care professional as soon as possible:  allergic reactions like skin rash, itching or hives, swelling of the face, lips, or tongue  agitation, nervousness  confusion  hallucinations Side effects that usually do not require medical attention (report to your doctor or health care professional if they continue or are bothersome):  constipation  diarrhea  dizziness  headache This list may not describe all possible side effects. Call your doctor for medical advice about side effects. You may report side effects to FDA at 1-800-FDA-1088. Where should I keep my medicine? This medicine is given in a hospital or clinic. You will not be given this medicine to store at home. NOTE: This sheet is a summary.   It may not cover  all possible information. If you have questions about this medicine, talk to your doctor, pharmacist, or health care provider.  2021 [employer] (2016-09-26 13:16:46)  

## 2020-03-20 ENCOUNTER — Encounter: Payer: Self-pay | Admitting: *Deleted

## 2020-03-20 ENCOUNTER — Ambulatory Visit: Payer: PRIVATE HEALTH INSURANCE | Admitting: Physician Assistant

## 2020-03-20 LAB — AFP TUMOR MARKER: AFP, Serum, Tumor Marker: 586 ng/mL — ABNORMAL HIGH (ref 0.0–8.3)

## 2020-03-20 NOTE — Progress Notes (Signed)
Oncology Nurse Navigator Documentation  Oncology Nurse Navigator Flowsheets 03/20/2020  Abnormal Finding Date -  Confirmed Diagnosis Date -  Diagnosis Status -  Planned Course of Treatment -  Phase of Treatment -  Chemotherapy Actual Start Date: -  Chemo/Radiation Concurrent Pending- Reason: -  Radiation Actual Start Date: -  Navigator Follow Up Date: 04/09/2020  Navigator Follow Up Reason: Follow-up Appointment  Navigator Location [facility]  Referral Date to RadOnc/MedOnc -  Navigator Encounter Type Appt/Treatment Plan Review  Telephone -  Treatment Initiated Date -  Patient Visit Type MedOnc  Treatment Phase Active Tx  Barriers/Navigation Needs Anxiety;Mobility Issues;Morbidities/Frailty;Pain  Education -  Interventions None Required  Acuity Level 2-Minimal Needs (1-2 Barriers Identified)  Referrals -  Coordination of Care -  Education Method -  Support Groups/Services Friends and Family  Time Spent with Patient 15

## 2020-04-09 ENCOUNTER — Other Ambulatory Visit: Payer: Self-pay | Admitting: Hematology & Oncology

## 2020-04-09 ENCOUNTER — Inpatient Hospital Stay (HOSPITAL_BASED_OUTPATIENT_CLINIC_OR_DEPARTMENT_OTHER): Payer: Medicare Other | Admitting: Hematology & Oncology

## 2020-04-09 ENCOUNTER — Telehealth: Payer: Self-pay | Admitting: Hematology & Oncology

## 2020-04-09 ENCOUNTER — Ambulatory Visit: Payer: Medicare Other

## 2020-04-09 ENCOUNTER — Other Ambulatory Visit: Payer: Medicare Other

## 2020-04-09 ENCOUNTER — Inpatient Hospital Stay: Payer: Medicare Other

## 2020-04-09 ENCOUNTER — Other Ambulatory Visit: Payer: Self-pay

## 2020-04-09 ENCOUNTER — Encounter: Payer: Self-pay | Admitting: *Deleted

## 2020-04-09 ENCOUNTER — Encounter: Payer: Self-pay | Admitting: Hematology & Oncology

## 2020-04-09 ENCOUNTER — Inpatient Hospital Stay: Payer: Medicare Other | Attending: Hematology & Oncology

## 2020-04-09 VITALS — HR 97

## 2020-04-09 VITALS — BP 121/86 | HR 101 | Temp 97.8°F | Resp 20 | Wt 216.0 lb

## 2020-04-09 DIAGNOSIS — C7801 Secondary malignant neoplasm of right lung: Secondary | ICD-10-CM

## 2020-04-09 DIAGNOSIS — C7800 Secondary malignant neoplasm of unspecified lung: Secondary | ICD-10-CM

## 2020-04-09 DIAGNOSIS — D509 Iron deficiency anemia, unspecified: Secondary | ICD-10-CM | POA: Diagnosis not present

## 2020-04-09 DIAGNOSIS — Z7901 Long term (current) use of anticoagulants: Secondary | ICD-10-CM | POA: Diagnosis not present

## 2020-04-09 DIAGNOSIS — C779 Secondary and unspecified malignant neoplasm of lymph node, unspecified: Secondary | ICD-10-CM | POA: Insufficient documentation

## 2020-04-09 DIAGNOSIS — I4891 Unspecified atrial fibrillation: Secondary | ICD-10-CM | POA: Diagnosis not present

## 2020-04-09 DIAGNOSIS — Z923 Personal history of irradiation: Secondary | ICD-10-CM | POA: Insufficient documentation

## 2020-04-09 DIAGNOSIS — C220 Liver cell carcinoma: Secondary | ICD-10-CM

## 2020-04-09 DIAGNOSIS — C7951 Secondary malignant neoplasm of bone: Secondary | ICD-10-CM | POA: Diagnosis not present

## 2020-04-09 DIAGNOSIS — R772 Abnormality of alphafetoprotein: Secondary | ICD-10-CM | POA: Diagnosis not present

## 2020-04-09 DIAGNOSIS — Z5112 Encounter for antineoplastic immunotherapy: Secondary | ICD-10-CM | POA: Diagnosis present

## 2020-04-09 LAB — CMP (CANCER CENTER ONLY)
ALT: 37 U/L (ref 0–44)
AST: 70 U/L — ABNORMAL HIGH (ref 15–41)
Albumin: 2.7 g/dL — ABNORMAL LOW (ref 3.5–5.0)
Alkaline Phosphatase: 203 U/L — ABNORMAL HIGH (ref 38–126)
Anion gap: 7 (ref 5–15)
BUN: 12 mg/dL (ref 8–23)
CO2: 26 mmol/L (ref 22–32)
Calcium: 8.8 mg/dL — ABNORMAL LOW (ref 8.9–10.3)
Chloride: 106 mmol/L (ref 98–111)
Creatinine: 0.62 mg/dL (ref 0.61–1.24)
GFR, Estimated: >60 mL/min (ref >60)
Glucose, Bld: 137 mg/dL — ABNORMAL HIGH (ref 70–99)
Potassium: 3.9 mmol/L (ref 3.5–5.1)
Sodium: 139 mmol/L (ref 135–145)
Total Bilirubin: 0.9 mg/dL (ref 0.3–1.2)
Total Protein: 5.6 g/dL — ABNORMAL LOW (ref 6.5–8.1)

## 2020-04-09 LAB — CBC WITH DIFFERENTIAL (CANCER CENTER ONLY)
Abs Immature Granulocytes: 0.03 10*3/uL (ref 0.00–0.07)
Basophils Absolute: 0 10*3/uL (ref 0.0–0.1)
Basophils Relative: 1 %
Eosinophils Absolute: 0 10*3/uL (ref 0.0–0.5)
Eosinophils Relative: 0 %
HCT: 36.4 % — ABNORMAL LOW (ref 39.0–52.0)
Hemoglobin: 11.6 g/dL — ABNORMAL LOW (ref 13.0–17.0)
Immature Granulocytes: 1 %
Lymphocytes Relative: 16 %
Lymphs Abs: 0.8 10*3/uL (ref 0.7–4.0)
MCH: 33.6 pg (ref 26.0–34.0)
MCHC: 31.9 g/dL (ref 30.0–36.0)
MCV: 105.5 fL — ABNORMAL HIGH (ref 80.0–100.0)
Monocytes Absolute: 0.7 10*3/uL (ref 0.1–1.0)
Monocytes Relative: 13 %
Neutro Abs: 3.7 10*3/uL (ref 1.7–7.7)
Neutrophils Relative %: 69 %
Platelet Count: 140 10*3/uL — ABNORMAL LOW (ref 150–400)
RBC: 3.45 MIL/uL — ABNORMAL LOW (ref 4.22–5.81)
RDW: 20.6 % — ABNORMAL HIGH (ref 11.5–15.5)
WBC Count: 5.3 10*3/uL (ref 4.0–10.5)
nRBC: 0 % (ref 0.0–0.2)

## 2020-04-09 LAB — LACTATE DEHYDROGENASE: LDH: 188 U/L (ref 98–192)

## 2020-04-09 MED ORDER — SODIUM CHLORIDE 0.9 % IV SOLN
300.0000 mg | Freq: Once | INTRAVENOUS | Status: AC
  Administered 2020-04-09: 300 mg via INTRAVENOUS
  Filled 2020-04-09: qty 20

## 2020-04-09 MED ORDER — NIVOLUMAB CHEMO INJECTION 100 MG/10ML
1.0890 mg/kg | Freq: Once | INTRAVENOUS | Status: AC
  Administered 2020-04-09: 100 mg via INTRAVENOUS
  Filled 2020-04-09: qty 10

## 2020-04-09 MED ORDER — FAMOTIDINE IN NACL 20-0.9 MG/50ML-% IV SOLN
INTRAVENOUS | Status: AC
  Filled 2020-04-09: qty 50

## 2020-04-09 MED ORDER — DIPHENHYDRAMINE HCL 50 MG/ML IJ SOLN
25.0000 mg | Freq: Once | INTRAMUSCULAR | Status: AC
  Administered 2020-04-09: 25 mg via INTRAVENOUS

## 2020-04-09 MED ORDER — DIPHENHYDRAMINE HCL 50 MG/ML IJ SOLN
INTRAMUSCULAR | Status: AC
  Filled 2020-04-09: qty 1

## 2020-04-09 MED ORDER — SODIUM CHLORIDE 0.9 % IV SOLN
Freq: Once | INTRAVENOUS | Status: AC
  Filled 2020-04-09: qty 250

## 2020-04-09 MED ORDER — FAMOTIDINE IN NACL 20-0.9 MG/50ML-% IV SOLN
20.0000 mg | Freq: Once | INTRAVENOUS | Status: AC
  Administered 2020-04-09: 20 mg via INTRAVENOUS

## 2020-04-09 NOTE — Patient Instructions (Signed)
[place] Discharge Instructions for Patients Receiving Chemotherapy  Today you received the following chemotherapy agents Opdivo and Yervoy  To help prevent nausea and vomiting after your treatment, we encourage you to take your nausea medication as prescribed by MD.   If you develop nausea and vomiting that is not controlled by your nausea medication, call the clinic.   BELOW ARE SYMPTOMS THAT SHOULD BE REPORTED IMMEDIATELY:  *FEVER GREATER THAN 100.5 F  *CHILLS WITH OR WITHOUT FEVER  NAUSEA AND VOMITING THAT IS NOT CONTROLLED WITH YOUR NAUSEA MEDICATION  *UNUSUAL SHORTNESS OF BREATH  *UNUSUAL BRUISING OR BLEEDING  TENDERNESS IN MOUTH AND THROAT WITH OR WITHOUT PRESENCE OF ULCERS  *URINARY PROBLEMS  *BOWEL PROBLEMS  UNUSUAL RASH Items with * indicate a potential emergency and should be followed up as soon as possible.  Feel free to call the clinic should you have any questions or concerns. The clinic phone number is [phone].  Please show the [place] at check-in to the Emergency Department and triage nurse.  Dexamethasone injection What is this medicine? DEXAMETHASONE (dex a METH a sone) is a corticosteroid. It is used to treat inflammation of the skin, joints, lungs, and other organs. Common conditions treated include asthma, allergies, and arthritis. It is also used for other conditions, like blood disorders and diseases of the adrenal glands. This medicine may be used for other purposes; ask your health care provider or pharmacist if you have questions. COMMON BRAND NAME(S): Decadron, DoubleDex, ReadySharp Dexamethasone, Simplist Dexamethasone, Solurex What should I tell my health care provider before I take this medicine? They need to know if you have any of these conditions:  Cushing's syndrome  diabetes  glaucoma  heart disease  high blood pressure  infection like herpes, measles, tuberculosis, or chickenpox  kidney  disease  liver disease  mental illness  myasthenia gravis  osteoporosis  previous heart attack  seizures  stomach or intestine problems  thyroid disease  an unusual or allergic reaction to dexamethasone, corticosteroids, other medicines, lactose, foods, dyes, or preservatives  pregnant or trying to get pregnant  breast-feeding How should I use this medicine? This medicine is for injection into a muscle, joint, lesion, soft tissue, or vein. It is given by a health care professional in a hospital or clinic setting. Talk to your pediatrician regarding the use of this medicine in children. Special care may be needed. Overdosage: If you think you have taken too much of this medicine contact a poison control center or emergency room at once. NOTE: This medicine is only for you. Do not share this medicine with others. What if I miss a dose? This may not apply. If you are having a series of injections over a prolonged period, try not to miss an appointment. Call your doctor or health care professional to reschedule if you are unable to keep an appointment. What may interact with this medicine? Do not take this medicine with any of the following medications:  live virus vaccines This medicine may also interact with the following medications:  aminoglutethimide  amphotericin B  aspirin and aspirin-like medicines  certain antibiotics like erythromycin, clarithromycin, and troleandomycin  certain antivirals for HIV or hepatitis  certain medicines for seizures like carbamazepine, phenobarbital, phenytoin  certain medicines to treat myasthenia gravis  cholestyramine  cyclosporine  digoxin  diuretics  ephedrine  male hormones, like estrogen or progestins and birth control pills  insulin or other medicines for diabetes  isoniazid  ketoconazole  medicines  that relax muscles for surgery  mifepristone  NSAIDs, medicines for pain and inflammation, like ibuprofen  or naproxen  rifampin  skin tests for allergies  thalidomide  vaccines  warfarin This list may not describe all possible interactions. Give your health care provider a list of all the medicines, herbs, non-prescription drugs, or dietary supplements you use. Also tell them if you smoke, drink alcohol, or use illegal drugs. Some items may interact with your medicine. What should I watch for while using this medicine? Visit your health care professional for regular checks on your progress. Tell your health care professional if your symptoms do not start to get better or if they get worse. Your condition will be monitored carefully while you are receiving this medicine. Wear a medical ID bracelet or chain. Carry a card that describes your disease and details of your medicine and dosage times. This medicine may increase your risk of getting an infection. Call your health care professional for advice if you get a fever, chills, or sore throat, or other symptoms of a cold or flu. Do not treat yourself. Try to avoid being around people who are sick. Call your health care professional if you are around anyone with measles, chickenpox, or if you develop sores or blisters that do not heal properly. If you are going to need surgery or other procedures, tell your doctor or health care professional that you have taken this medicine within the last 12 months. Ask your doctor or health care professional about your diet. You may need to lower the amount of salt you eat. This medicine may increase blood sugar. Ask your healthcare provider if changes in diet or medicines are needed if you have diabetes. What side effects may I notice from receiving this medicine? Side effects that you should report to your doctor or health care professional as soon as possible:  allergic reactions like skin rash, itching or hives, swelling of the face, lips, or tongue  bloody or black, tarry stools  changes in emotions or  moods  changes in vision  confusion, excitement, restlessness  depressed mood  eye pain  hallucinations  muscle weakness  severe or sudden stomach or belly pain  signs and symptoms of high blood sugar such as being more thirsty or hungry or having to urinate more than normal. You may also feel very tired or have blurry vision.  signs and symptoms of infection like fever; chills; cough; sore throat; pain or trouble passing urine  swelling of ankles, feet  unusual bruising or bleeding  wounds that do not heal Side effects that usually do not require medical attention (report to your doctor or health care professional if they continue or are bothersome):  increased appetite  increased growth of face or body hair  headache  nausea, vomiting  pain, redness, or irritation at site where injected  skin problems, acne, thin and shiny skin  trouble sleeping  weight gain This list may not describe all possible side effects. Call your doctor for medical advice about side effects. You may report side effects to FDA at 1-800-FDA-1088. Where should I keep my medicine? This medicine is given in a hospital or clinic and will not be stored at home. NOTE: This sheet is a summary. It may not cover all possible information. If you have questions about this medicine, talk to your doctor, pharmacist, or health care provider.  2021 Elsevier/Gold Standard (2018-08-24 13:51:58) Ipilimumab injection What is this medicine? IPILIMUMAB (IP i LIM ue mab)  is a monoclonal antibody. It is used to treat colorectal cancer, kidney cancer, liver cancer, lung cancer, melanoma, and mesothelioma. This medicine may be used for other purposes; ask your health care provider or pharmacist if you have questions. COMMON BRAND NAME(S): YERVOY What should I tell my health care provider before I take this medicine? They need to know if you have any of these conditions:  autoimmune diseases like Crohn's  disease, ulcerative colitis, or lupus  have had or planning to have an allogeneic stem cell transplant (uses someone else's stem cells)  history of organ transplant  nervous system problems like myasthenia gravis or Guillain-Barre syndrome  an unusual or allergic reaction to ipilimumab, other medicines, foods, dyes, or preservatives  pregnant or trying to get pregnant  breast-feeding How should I use this medicine? This medicine is for infusion into a vein. It is given by a health care professional in a hospital or clinic setting. A special MedGuide will be given to you before each treatment. Be sure to read this information carefully each time. Talk to your pediatrician regarding the use of this medicine in children. While this drug may be prescribed for children as young as 12 years for selected conditions, precautions do apply. Overdosage: If you think you have taken too much of this medicine contact a poison control center or emergency room at once. NOTE: This medicine is only for you. Do not share this medicine with others. What if I miss a dose? It is important not to miss your dose. Call your doctor or health care professional if you are unable to keep an appointment. What may interact with this medicine? Interactions are not expected. This list may not describe all possible interactions. Give your health care provider a list of all the medicines, herbs, non-prescription drugs, or dietary supplements you use. Also tell them if you smoke, drink alcohol, or use illegal drugs. Some items may interact with your medicine. What should I watch for while using this medicine? Tell your doctor or healthcare professional if your symptoms do not start to get better or if they get worse. Do not become pregnant while taking this medicine or for 3 months after stopping it. Women should inform their doctor if they wish to become pregnant or think they might be pregnant. There is a potential for  serious side effects to an unborn child. Talk to your health care professional or pharmacist for more information. Do not breast-feed an infant while taking this medicine or for 3 months after the last dose. Your condition will be monitored carefully while you are receiving this medicine. You may need blood work done while you are taking this medicine. What side effects may I notice from receiving this medicine? Side effects that you should report to your doctor or health care professional as soon as possible:  allergic reactions like skin rash, itching or hives, swelling of the face, lips, or tongue  black, tarry stools  bloody or watery diarrhea  changes in vision  dizziness  eye pain  fast, irregular heartbeat  feeling anxious  feeling faint or lightheaded, falls  nausea, vomiting  pain, tingling, numbness in the hands or feet  redness, blistering, peeling or loosening of the skin, including inside the mouth  signs and symptoms of liver injury like dark yellow or brown urine; general ill feeling or flu-like symptoms; light-colored stools; loss of appetite; nausea; right upper belly pain; unusually weak or tired; yellowing of the eyes or skin  unusual bleeding  or bruising Side effects that usually do not require medical attention (report to your doctor or health care professional if they continue or are bothersome):  headache  loss of appetite  trouble sleeping This list may not describe all possible side effects. Call your doctor for medical advice about side effects. You may report side effects to FDA at 1-800-FDA-1088. Where should I keep my medicine? This drug is given in a hospital or clinic and will not be stored at home. NOTE: This sheet is a summary. It may not cover all possible information. If you have questions about this medicine, talk to your doctor, pharmacist, or health care provider.  2021 Elsevier/Gold Standard (2019-01-12 18:53:00) Nivolumab  injection What is this medicine? NIVOLUMAB (nye VOL ue mab) is a monoclonal antibody. It treats certain types of cancer. Some of the cancers treated are colon cancer, head and neck cancer, Hodgkin lymphoma, lung cancer, and melanoma. This medicine may be used for other purposes; ask your health care provider or pharmacist if you have questions. COMMON BRAND NAME(S): Opdivo What should I tell my health care provider before I take this medicine? They need to know if you have any of these conditions:  autoimmune diseases like Crohn's disease, ulcerative colitis, or lupus  have had or planning to have an allogeneic stem cell transplant (uses someone else's stem cells)  history of chest radiation  history of organ transplant  nervous system problems like myasthenia gravis or Guillain-Barre syndrome  an unusual or allergic reaction to nivolumab, other medicines, foods, dyes, or preservatives  pregnant or trying to get pregnant  breast-feeding How should I use this medicine? This medicine is for infusion into a vein. It is given by a health care professional in a hospital or clinic setting. A special MedGuide will be given to you before each treatment. Be sure to read this information carefully each time. Talk to your pediatrician regarding the use of this medicine in children. While this drug may be prescribed for children as young as 12 years for selected conditions, precautions do apply. Overdosage: If you think you have taken too much of this medicine contact a poison control center or emergency room at once. NOTE: This medicine is only for you. Do not share this medicine with others. What if I miss a dose? It is important not to miss your dose. Call your doctor or health care professional if you are unable to keep an appointment. What may interact with this medicine? Interactions have not been studied. This list may not describe all possible interactions. Give your health care provider  a list of all the medicines, herbs, non-prescription drugs, or dietary supplements you use. Also tell them if you smoke, drink alcohol, or use illegal drugs. Some items may interact with your medicine. What should I watch for while using this medicine? This drug may make you feel generally unwell. Continue your course of treatment even though you feel ill unless your doctor tells you to stop. You may need blood work done while you are taking this medicine. Do not become pregnant while taking this medicine or for 5 months after stopping it. Women should inform their doctor if they wish to become pregnant or think they might be pregnant. There is a potential for serious side effects to an unborn child. Talk to your health care professional or pharmacist for more information. Do not breast-feed an infant while taking this medicine or for 5 months after stopping it. What side effects may I notice  from receiving this medicine? Side effects that you should report to your doctor or health care professional as soon as possible:  allergic reactions like skin rash, itching or hives, swelling of the face, lips, or tongue  breathing problems  blood in the urine  bloody or watery diarrhea or black, tarry stools  changes in emotions or moods  changes in vision  chest pain  cough  dizziness  feeling faint or lightheaded, falls  fever, chills  headache with fever, neck stiffness, confusion, loss of memory, sensitivity to light, hallucination, loss of contact with reality, or seizures  joint pain  mouth sores  redness, blistering, peeling or loosening of the skin, including inside the mouth  severe muscle pain or weakness  signs and symptoms of high blood sugar such as dizziness; dry mouth; dry skin; fruity breath; nausea; stomach pain; increased hunger or thirst; increased urination  signs and symptoms of kidney injury like trouble passing urine or change in the amount of urine  signs and  symptoms of liver injury like dark yellow or brown urine; general ill feeling or flu-like symptoms; light-colored stools; loss of appetite; nausea; right upper belly pain; unusually weak or tired; yellowing of the eyes or skin  swelling of the ankles, feet, hands  trouble passing urine or change in the amount of urine  unusually weak or tired  weight gain or loss Side effects that usually do not require medical attention (report to your doctor or health care professional if they continue or are bothersome):  bone pain  constipation  decreased appetite  diarrhea  muscle pain  nausea, vomiting  tiredness This list may not describe all possible side effects. Call your doctor for medical advice about side effects. You may report side effects to FDA at 1-800-FDA-1088. Where should I keep my medicine? This drug is given in a hospital or clinic and will not be stored at home. NOTE: This sheet is a summary. It may not cover all possible information. If you have questions about this medicine, talk to your doctor, pharmacist, or health care provider.  2021 Elsevier/Gold Standard (2019-06-15 10:08:25) Diphenhydramine injection What is this medicine? DIPHENHYDRAMINE (dye fen HYE dra meen) is an antihistamine. It is used to treat the symptoms of an allergic reaction and motion sickness. It is also used to treat Parkinson's disease. This medicine may be used for other purposes; ask your health care provider or pharmacist if you have questions. COMMON BRAND NAME(S): Benadryl What should I tell my health care provider before I take this medicine? They need to know if you have any of these conditions:  asthma or lung disease  glaucoma  high blood pressure or heart disease  liver disease  pain or difficulty passing urine  prostate trouble  ulcers or other stomach problems  an unusual or allergic reaction to diphenhydramine, antihistamines, other medicines foods, dyes, or  preservatives  pregnant or trying to get pregnant  breast-feeding How should I use this medicine? This medicine is for injection into a vein or a muscle. It is usually given by a health care professional in a hospital or clinic setting. If you get this medicine at home, you will be taught how to prepare and give this medicine. Use exactly as directed. Take your medicine at regular intervals. Do not take your medicine more often than directed. It is important that you put your used needles and syringes in a special sharps container. Do not put them in a trash can. If you do  not have a sharps container, call your pharmacist or healthcare provider to get one. Talk to your pediatrician regarding the use of this medicine in children. While this drug may be prescribed for selected conditions, precautions do apply. This medicine is not approved for use in newborns and premature babies. Patients over 97 years old may have a stronger reaction and need a smaller dose. Overdosage: If you think you have taken too much of this medicine contact a poison control center or emergency room at once. NOTE: This medicine is only for you. Do not share this medicine with others. What if I miss a dose? If you miss a dose, take it as soon as you can. If it is almost time for your next dose, take only that dose. Do not take double or extra doses. What may interact with this medicine? Do not take this medicine with any of the following medications:  MAOIs like Carbex, Eldepryl, Marplan, Nardil, and Parnate This medicine may also interact with the following medications:  alcohol  barbiturates, like phenobarbital  medicines for bladder spasm like oxybutynin, tolterodine  medicines for blood pressure  medicines for depression, anxiety, or psychotic disturbances  medicines for movement abnormalities or Parkinson's disease  medicines for sleep  other medicines for cold, cough or allergy  some medicines for the  stomach like chlordiazepoxide, dicyclomine This list may not describe all possible interactions. Give your health care provider a list of all the medicines, herbs, non-prescription drugs, or dietary supplements you use. Also tell them if you smoke, drink alcohol, or use illegal drugs. Some items may interact with your medicine. What should I watch for while using this medicine? Your condition will be monitored carefully while you are receiving this medicine. Tell your doctor or healthcare professional if your symptoms do not start to get better or if they get worse. You may get drowsy or dizzy. Do not drive, use machinery, or do anything that needs mental alertness until you know how this medicine affects you. Do not stand or sit up quickly, especially if you are an older patient. This reduces the risk of dizzy or fainting spells. Alcohol may interfere with the effect of this medicine. Avoid alcoholic drinks. Your mouth may get dry. Chewing sugarless gum or sucking hard candy, and drinking plenty of water may help. Contact your doctor if the problem does not go away or is severe. What side effects may I notice from receiving this medicine? Side effects that you should report to your doctor or health care professional as soon as possible:  allergic reactions like skin rash, itching or hives, swelling of the face, lips, or tongue  breathing problems  changes in vision  chills  confused, agitated, nervous  irregular or fast heartbeat  low blood pressure  seizures  tremor  trouble passing urine  unusual bleeding or bruising  unusually weak or tired Side effects that usually do not require medical attention (report to your doctor or health care professional if they continue or are bothersome):  constipation, diarrhea  drowsy  headache  loss of appetite  stomach upset, vomiting  sweating  thick mucous This list may not describe all possible side effects. Call your doctor for  medical advice about side effects. You may report side effects to FDA at 1-800-FDA-1088. Where should I keep my medicine? Keep out of the reach of children. If you are using this medicine at home, you will be instructed on how to store this medicine. Throw away any unused  medicine after the expiration date on the label. NOTE: This sheet is a summary. It may not cover all possible information. If you have questions about this medicine, talk to your doctor, pharmacist, or health care provider.  2021 Elsevier/Gold Standard (2007-06-01 14:28:35) Famotidine injection What is this medicine? FAMOTIDINE (fa MOE ti deen) is a type of antihistamine that blocks the release of stomach acid. It is used to treat stomach or intestinal ulcers. It can relieve ulcer pain and discomfort, and the heartburn from acid reflux. This medicine may be used for other purposes; ask your health care provider or pharmacist if you have questions. COMMON BRAND NAME(S): Pepcid What should I tell my health care provider before I take this medicine? They need to know if you have any of these conditions:  kidney or liver disease  an unusual or allergic reaction to famotidine, other medicines, foods, dyes, or preservatives  pregnant or trying to get pregnant  breast-feeding How should I use this medicine? This medicine is for infusion into a vein. It is given by a health care professional in a hospital or clinic setting. Talk to your pediatrician regarding the use of this medicine in children. Special care may be needed. Overdosage: If you think you have taken too much of this medicine contact a poison control center or emergency room at once. NOTE: This medicine is only for you. Do not share this medicine with others. What if I miss a dose? This does not apply. What may interact with this medicine?  delavirdine  itraconazole  ketoconazole This list may not describe all possible interactions. Give your health care  provider a list of all the medicines, herbs, non-prescription drugs, or dietary supplements you use. Also tell them if you smoke, drink alcohol, or use illegal drugs. Some items may interact with your medicine. What should I watch for while using this medicine? Tell your doctor or health care professional if your condition does not start to get better or gets worse. Do not take with aspirin, ibuprofen, or other antiinflammatory medicines. These can aggravate your condition. Do not smoke cigarettes or drink alcohol. These increase irritation in your stomach and can increase the time it will take for ulcers to heal. Cigarettes and alcohol can also worsen acid reflux or heartburn. If you get black, tarry stools or vomit up what looks like coffee grounds, call your doctor or health care professional at once. You may have a bleeding ulcer. This medicine may cause a decrease in vitamin B12. You should make sure that you get enough vitamin B12 while you are taking this medicine. Discuss the foods you eat and the vitamins you take with your health care professional. What side effects may I notice from receiving this medicine? Side effects that you should report to your doctor or health care professional as soon as possible:  allergic reactions like skin rash, itching or hives, swelling of the face, lips, or tongue  agitation, nervousness  confusion  hallucinations Side effects that usually do not require medical attention (report to your doctor or health care professional if they continue or are bothersome):  constipation  diarrhea  dizziness  headache This list may not describe all possible side effects. Call your doctor for medical advice about side effects. You may report side effects to FDA at 1-800-FDA-1088. Where should I keep my medicine? This medicine is given in a hospital or clinic. You will not be given this medicine to store at home. NOTE: This sheet is a summary.  It may not cover  all possible information. If you have questions about this medicine, talk to your doctor, pharmacist, or health care provider.  2021 Elsevier/Gold Standard (2016-09-26 13:16:46)

## 2020-04-09 NOTE — Telephone Encounter (Signed)
Appointments scheduled calendar mailed per 2/14 los

## 2020-04-09 NOTE — Progress Notes (Signed)
Patient needs CT scheduled. Will follow up later this week for insurance PA and scheduling.  Oncology Nurse Navigator Documentation  Oncology Nurse Navigator Flowsheets 04/09/2020  Abnormal Finding Date -  Confirmed Diagnosis Date -  Diagnosis Status -  Planned Course of Treatment -  Phase of Treatment -  Chemotherapy Actual Start Date: -  Chemo/Radiation Concurrent Pending- Reason: -  Radiation Actual Start Date: -  Navigator Follow Up Date: 04/13/2020  Navigator Follow Up Reason: Appointment Review  Navigator Location [facility]  Referral Date to RadOnc/MedOnc -  Navigator Encounter Type Treatment  Telephone -  Treatment Initiated Date -  Patient Visit Type MedOnc  Treatment Phase Active Tx  Barriers/Navigation Needs Anxiety;Mobility Issues;Morbidities/Frailty;Pain  Education -  Interventions Psycho-Social Support  Acuity Level 2-Minimal Needs (1-2 Barriers Identified)  Referrals -  Coordination of Care -  Education Method -  Support Groups/Services Friends and Family  Time Spent with Patient 65

## 2020-04-09 NOTE — Progress Notes (Signed)
Hematology and Oncology Follow Up Visit  Kerry Adams [id number] 1955-12-10 65 y.o. 04/09/2020   Principle Diagnosis:  Hepatocellular carcinoma -- mets to bone, lung, lymph nodes Iron def anemia  Current Therapy:        Palliative Radiation to the RIGHT femur/ chest wall Tecentriq/Avastin -- started 08/05/2019, s/p cycle #9  -- d/c on 02/20/2020 due to progressive cancer Nivolumab/Yervoy -- s/p cycle #2-- start on 02/27/2020 Xgeva 120 mg sq q 3 months -- next dose in 04/2020 Feraheme -- dose given on 02/20/2020   Interim History:  Kerry Adams is here today with his wife.  He has been having some problems.  He has been having some bright red blood per rectum.  He has been straining.  He is on Xarelto because of atrial fibrillation.  Unfortunately, he is not notified us all this before.  I told him to try a stool softener.  He is not having diarrhea.  If this continues to happen, then he will like to see a gastroenterologist.  He does not have a gastroenterologist so we will probably have to refer him to a gastroenterologist.  His last alpha-fetoprotein was up to 586.  Unfortunately this is a ominous sign that things are progressing.  I think were going to have to get a CAT scan on him to see how everything looks.  Is having some problems with the right shoulder.  I know he has had radiation up to that area in the past because of tumors.  He has had no fever.  He has had no headache.  He has had a little bit of leg swelling which is chronic.  There has been no problems with cough.  He does have some shortness of breath with exertion.  Overall, his performance status is ECOG 1.    Medications:  Allergies as of 04/09/2020   No Known Allergies     Medication List       Accurate as of April 09, 2020 11:20 AM. If you have any questions, ask your nurse or doctor.        acetaminophen 500 MG tablet Commonly known as: TYLENOL Take 500 mg by mouth every 8 (eight) hours as  needed (pain).   amoxicillin 500 MG capsule Commonly known as: AMOXIL Prior to dental appointments   diazepam 2 MG tablet Commonly known as: VALIUM Take 1 tablet (2 mg total) by mouth every 12 (twelve) hours as needed for anxiety.   famotidine 10 MG tablet Commonly known as: PEPCID Take 10 mg by mouth every morning.   metoprolol tartrate 100 MG tablet Commonly known as: LOPRESSOR Take 1 tablet (100 mg total) by mouth 2 (two) times daily.   multivitamin with minerals Tabs tablet Take 1 tablet by mouth daily.   ondansetron 8 MG tablet Commonly known as: ZOFRAN Take by mouth every 8 (eight) hours as needed.   polyethylene glycol powder 17 GM/SCOOP powder Commonly known as: GLYCOLAX/MIRALAX Take 1 Container by mouth daily as needed.   Potassium 99 MG Tabs Take 99 mg by mouth daily.   prochlorperazine 10 MG tablet Commonly known as: COMPAZINE Take 10 mg by mouth every 6 (six) hours as needed for nausea or vomiting.   rivaroxaban 20 MG Tabs tablet Commonly known as: Xarelto Take 1 tablet (20 mg total) by mouth daily with supper.   traMADol 50 MG tablet Commonly known as: ULTRAM TAKE 1 TABLET BY MOUTH EVERY 6 HOURS AS NEEDED   trolamine salicylate 10 % cream Commonly  known as: ASPERCREME Apply 1 application topically as needed for muscle pain.       Allergies: No Known Allergies  Past Medical History, Surgical history, Social history, and Family History were reviewed and updated.  Review of Systems: Review of Systems  Constitutional: Negative.   HENT: Negative.   Eyes: Negative.   Respiratory: Negative.   Cardiovascular: Negative.   Gastrointestinal: Negative.   Genitourinary: Negative.   Musculoskeletal: Positive for back pain, falls and joint pain.  Skin: Negative.   Neurological: Positive for focal weakness.  Endo/Heme/Allergies: Negative.   Psychiatric/Behavioral: Negative.      Physical Exam:  weight is 216 lb (98 kg). His oral temperature is  97.8 F (36.6 C). His blood pressure is 121/86 and his pulse is 101 (abnormal). His respiration is 20 and oxygen saturation is 98%.   Wt Readings from Last 3 Encounters:  04/09/20 216 lb (98 kg)  03/19/20 214 lb (97.1 kg)  03/15/20 214 lb 9.6 oz (97.3 kg)    Physical Exam Vitals reviewed.  HENT:     Head: Normocephalic and atraumatic.  Eyes:     Pupils: Pupils are equal, round, and reactive to light.  Cardiovascular:     Rate and Rhythm: Normal rate and regular rhythm.     Heart sounds: Normal heart sounds.  Pulmonary:     Effort: Pulmonary effort is normal.     Breath sounds: Normal breath sounds.  Abdominal:     General: Bowel sounds are normal.     Palpations: Abdomen is soft.  Musculoskeletal:        General: No tenderness or deformity. Normal range of motion.     Cervical back: Normal range of motion.  Lymphadenopathy:     Cervical: No cervical adenopathy.  Skin:    General: Skin is warm and dry.     Findings: No erythema or rash.  Neurological:     Mental Status: He is alert and oriented to person, place, and time.  Psychiatric:        Behavior: Behavior normal.        Thought Content: Thought content normal.        Judgment: Judgment normal.    Lab Results  Component Value Date   WBC 5.3 04/09/2020   HGB 11.6 (L) 04/09/2020   HCT 36.4 (L) 04/09/2020   MCV 105.5 (H) 04/09/2020   PLT 140 (L) 04/09/2020   Lab Results  Component Value Date   FERRITIN 242 03/19/2020   IRON 89 03/19/2020   TIBC 259 03/19/2020   UIBC 170 03/19/2020   IRONPCTSAT 34 03/19/2020   Lab Results  Component Value Date   RETICCTPCT 1.9 01/27/2020   RBC 3.45 (L) 04/09/2020   Lab Results  Component Value Date   KPAFRELGTCHN 23.3 (H) 06/20/2019   LAMBDASER 19.9 06/20/2019   KAPLAMBRATIO 1.17 06/20/2019   Lab Results  Component Value Date   IGGSERUM 1,078 06/20/2019   IGA 457 (H) 06/20/2019   IGMSERUM 80 06/20/2019   Lab Results  Component Value Date   TOTALPROTELP 6.2  06/20/2019   ALBUMINELP 3.2 06/20/2019   A1GS 0.3 06/20/2019   A2GS 0.6 06/20/2019   BETS 1.2 06/20/2019   GAMS 0.9 06/20/2019   MSPIKE Not Observed 06/20/2019     Chemistry      Component Value Date/Time   NA 139 04/09/2020 0939   NA 137 05/13/2019 1110   K 3.9 04/09/2020 0939   CL 106 04/09/2020 0939   CO2 26 04/09/2020 0939  BUN 12 04/09/2020 0939   BUN 7 (L) 05/13/2019 1110   CREATININE 0.62 04/09/2020 0939      Component Value Date/Time   CALCIUM 8.8 (L) 04/09/2020 0939   ALKPHOS 203 (H) 04/09/2020 0939   AST 70 (H) 04/09/2020 0939   ALT 37 04/09/2020 0939   BILITOT 0.9 04/09/2020 0939      Impression and Plan: Mr. Demartini is a very pleasant 65 yo caucasian gentleman with metastatic hepatocellular carcinoma.   It is hard to say how well things are working right now.  Again the alpha-fetoprotein being on the higher side is certainly troublesome.  We will have to see what the level is today.  I will have to set him up with scans.  I think this is going to be the best way for [place] to know what is going on.  We will try to get these set up for another couple weeks.  Hopefully, he will do a stool softener.  If he still has the bright red blood per rectum, he will let us know.   Volanda Napoleon, MD 2/14/202211:20 AM

## 2020-04-09 NOTE — Progress Notes (Signed)
Increase ipilimumab dose to 300mg /kg (3mg /kg x current weight of 98kg) per Dr Marin Olp

## 2020-04-10 LAB — AFP TUMOR MARKER: AFP, Serum, Tumor Marker: 932 ng/mL — ABNORMAL HIGH (ref 0.0–8.3)

## 2020-04-17 ENCOUNTER — Encounter: Payer: Self-pay | Admitting: *Deleted

## 2020-04-17 NOTE — Progress Notes (Signed)
Oncology Nurse Navigator Documentation  Oncology Nurse Navigator Flowsheets 04/17/2020  Abnormal Finding Date -  Confirmed Diagnosis Date -  Diagnosis Status -  Planned Course of Treatment -  Phase of Treatment -  Chemotherapy Actual Start Date: -  Chemo/Radiation Concurrent Pending- Reason: -  Radiation Actual Start Date: -  Navigator Follow Up Date: 04/26/2020  Navigator Follow Up Reason: Scan Review  Navigator Location [facility]  Referral Date to RadOnc/MedOnc -  Navigator Encounter Type Appt/Treatment Plan Review  Telephone -  Treatment Initiated Date -  Patient Visit Type MedOnc  Treatment Phase Active Tx  Barriers/Navigation Needs Anxiety;Mobility Issues;Morbidities/Frailty;Pain  Education -  Interventions None Required  Acuity Level 2-Minimal Needs (1-2 Barriers Identified)  Referrals -  Coordination of Care -  Education Method -  Support Groups/Services Friends and Family  Time Spent with Patient 15

## 2020-04-26 ENCOUNTER — Other Ambulatory Visit: Payer: Self-pay

## 2020-04-26 ENCOUNTER — Ambulatory Visit (HOSPITAL_COMMUNITY)
Admission: RE | Admit: 2020-04-26 | Discharge: 2020-04-26 | Disposition: A | Discharge status: Home / Self Care | Payer: Medicare Other | Source: Ambulatory Visit | Attending: Hematology & Oncology | Admitting: Hematology & Oncology

## 2020-04-26 DIAGNOSIS — C220 Liver cell carcinoma: Secondary | ICD-10-CM | POA: Diagnosis present

## 2020-04-26 DIAGNOSIS — C7800 Secondary malignant neoplasm of unspecified lung: Secondary | ICD-10-CM | POA: Diagnosis present

## 2020-04-26 MED ORDER — IOHEXOL 300 MG/ML  SOLN
100.0000 mL | Freq: Once | INTRAMUSCULAR | Status: AC | PRN
  Administered 2020-04-26: 100 mL via INTRAVENOUS

## 2020-04-27 ENCOUNTER — Encounter: Payer: Self-pay | Admitting: *Deleted

## 2020-04-27 NOTE — Progress Notes (Signed)
Oncology Nurse Navigator Documentation  Oncology Nurse Navigator Flowsheets 04/27/2020  Abnormal Finding Date -  Confirmed Diagnosis Date -  Diagnosis Status -  Planned Course of Treatment -  Phase of Treatment -  Chemotherapy Actual Start Date: -  Chemo/Radiation Concurrent Pending- Reason: -  Radiation Actual Start Date: -  Navigator Follow Up Date: 05/01/2020  Navigator Follow Up Reason: Follow-up Appointment  Navigator Location [facility]  Referral Date to RadOnc/MedOnc -  Navigator Encounter Type Scan Review  Telephone -  Treatment Initiated Date -  Patient Visit Type MedOnc  Treatment Phase Active Tx  Barriers/Navigation Needs Anxiety;Mobility Issues;Morbidities/Frailty;Pain  Education -  Interventions None Required  Acuity Level 2-Minimal Needs (1-2 Barriers Identified)  Referrals -  Coordination of Care -  Education Method -  Support Groups/Services Friends and Family  Time Spent with Patient 15

## 2020-05-01 ENCOUNTER — Encounter: Payer: Self-pay | Admitting: *Deleted

## 2020-05-01 ENCOUNTER — Other Ambulatory Visit: Payer: Self-pay | Admitting: *Deleted

## 2020-05-01 ENCOUNTER — Telehealth: Payer: Self-pay | Admitting: Cardiology

## 2020-05-01 ENCOUNTER — Inpatient Hospital Stay: Payer: Medicare Other

## 2020-05-01 ENCOUNTER — Inpatient Hospital Stay: Payer: Medicare Other | Admitting: Hematology & Oncology

## 2020-05-01 ENCOUNTER — Encounter: Payer: Self-pay | Admitting: Hematology & Oncology

## 2020-05-01 ENCOUNTER — Other Ambulatory Visit: Payer: Self-pay

## 2020-05-01 ENCOUNTER — Inpatient Hospital Stay: Payer: Medicare Other | Attending: Hematology & Oncology

## 2020-05-01 VITALS — BP 103/77 | HR 101 | Temp 97.6°F | Resp 18 | Wt 220.5 lb

## 2020-05-01 DIAGNOSIS — C220 Liver cell carcinoma: Secondary | ICD-10-CM

## 2020-05-01 DIAGNOSIS — C7800 Secondary malignant neoplasm of unspecified lung: Secondary | ICD-10-CM

## 2020-05-01 DIAGNOSIS — S81801A Unspecified open wound, right lower leg, initial encounter: Secondary | ICD-10-CM

## 2020-05-01 DIAGNOSIS — I4891 Unspecified atrial fibrillation: Secondary | ICD-10-CM

## 2020-05-01 LAB — CBC WITH DIFFERENTIAL (CANCER CENTER ONLY)
Abs Immature Granulocytes: 0.03 10*3/uL (ref 0.00–0.07)
Basophils Absolute: 0 10*3/uL (ref 0.0–0.1)
Basophils Relative: 0 %
Eosinophils Absolute: 0 10*3/uL (ref 0.0–0.5)
Eosinophils Relative: 0 %
HCT: 29.2 % — ABNORMAL LOW (ref 39.0–52.0)
Hemoglobin: 8.7 g/dL — ABNORMAL LOW (ref 13.0–17.0)
Immature Granulocytes: 1 %
Lymphocytes Relative: 8 %
Lymphs Abs: 0.5 10*3/uL — ABNORMAL LOW (ref 0.7–4.0)
MCH: 33.9 pg (ref 26.0–34.0)
MCHC: 29.8 g/dL — ABNORMAL LOW (ref 30.0–36.0)
MCV: 113.6 fL — ABNORMAL HIGH (ref 80.0–100.0)
Monocytes Absolute: 0.9 10*3/uL (ref 0.1–1.0)
Monocytes Relative: 15 %
Neutro Abs: 4.9 10*3/uL (ref 1.7–7.7)
Neutrophils Relative %: 76 %
Platelet Count: 143 10*3/uL — ABNORMAL LOW (ref 150–400)
RBC: 2.57 MIL/uL — ABNORMAL LOW (ref 4.22–5.81)
RDW: 18.8 % — ABNORMAL HIGH (ref 11.5–15.5)
WBC Count: 6.4 10*3/uL (ref 4.0–10.5)
nRBC: 0.5 % — ABNORMAL HIGH (ref 0.0–0.2)

## 2020-05-01 LAB — CMP (CANCER CENTER ONLY)
ALT: 53 U/L — ABNORMAL HIGH (ref 0–44)
AST: 71 U/L — ABNORMAL HIGH (ref 15–41)
Albumin: 2.8 g/dL — ABNORMAL LOW (ref 3.5–5.0)
Alkaline Phosphatase: 191 U/L — ABNORMAL HIGH (ref 38–126)
Anion gap: 5 (ref 5–15)
BUN: 18 mg/dL (ref 8–23)
CO2: 28 mmol/L (ref 22–32)
Calcium: 8.4 mg/dL — ABNORMAL LOW (ref 8.9–10.3)
Chloride: 106 mmol/L (ref 98–111)
Creatinine: 0.71 mg/dL (ref 0.61–1.24)
GFR, Estimated: >60 mL/min (ref >60)
Glucose, Bld: 178 mg/dL — ABNORMAL HIGH (ref 70–99)
Potassium: 4.4 mmol/L (ref 3.5–5.1)
Sodium: 139 mmol/L (ref 135–145)
Total Bilirubin: 0.9 mg/dL (ref 0.3–1.2)
Total Protein: 5.8 g/dL — ABNORMAL LOW (ref 6.5–8.1)

## 2020-05-01 LAB — TYPE AND SCREEN
ABO/RH(D): B POS
Antibody Screen: NEGATIVE

## 2020-05-01 LAB — LACTATE DEHYDROGENASE: LDH: 189 U/L (ref 98–192)

## 2020-05-01 LAB — PREPARE RBC (CROSSMATCH)

## 2020-05-01 MED ORDER — SILVER SULFADIAZINE 1 % EX CREA: 1.0000 "application " | TOPICAL_CREAM | Freq: Every day | CUTANEOUS | 1 refills | Status: AC

## 2020-05-01 MED ORDER — METOLAZONE 5 MG PO TABS: 5.0000 mg | ORAL_TABLET | Freq: Every day | ORAL | 4 refills | Status: AC

## 2020-05-01 MED ORDER — RIVAROXABAN 10 MG PO TABS: 10.0000 mg | ORAL_TABLET | Freq: Every day | ORAL | 3 refills | Status: AC

## 2020-05-01 MED ORDER — FUROSEMIDE 40 MG PO TABS: 40.0000 mg | ORAL_TABLET | Freq: Every day | ORAL | 3 refills | Status: AC

## 2020-05-01 NOTE — Progress Notes (Unsigned)
Hematology and Oncology Follow Up Visit  Kerry Adams [id number] 07/20/1955 65 y.o. 05/01/2020   Principle Diagnosis:  Hepatocellular carcinoma -- mets to bone, lung, lymph nodes Iron def anemia  Current Therapy:        Palliative Radiation to the RIGHT femur/ chest wall Tecentriq/Avastin -- started 08/05/2019, s/p cycle #9  -- d/c on 02/20/2020 due to progressive cancer Nivolumab/Yervoy -- d/c on 05/01/2020 Xgeva 120 mg sq q 3 months - next dose in 04/2020 Feraheme -- dose given on 02/20/2020   Interim History:  Kerry Adams is here today with his wife.  Unfortunately, he has been having a lot of difficulties.  His hemoglobin is down to 8.7.  He is quite dyspneic.  He is somewhat tachycardic.  I believe that this GI bleeding is a real problem.  He is having some GI blood loss.  He has never had a colonoscopy or upper endoscopy.  I went ahead and spoke to Dr. Bryan Lemma, our resident GI expert.  He will see Kerry Adams this week and see about a upper endoscopy and maybe colonoscopy.  I told Kerry Adams to stop his Xarelto.  I spoke to his cardiologist's office.  He will stop the Xarelto for about 5 days and then go on 10 mg a day.  He has a lot of fluid.  He has his nonhealing ulcer in the right lower leg.  This is pretibial.  There is a lot of seepage out of this ulcer.  We will see about putting him on some Silvadene cream.  He does need some diuretic therapy.  I will put him on metolazone and Lasix.  Maybe this will help with his fluid retention.   Unfortunately, he did have a CT scan done.  This did show progression of his disease.  The vast majority of progression is in the chest.  He has a right upper lobe mass that invades the chest wall.  It measures 10.9 x 8.4 x 2.2 cm.  This is displacing the subclavian and axillary vessels.  Is adjacent to the SVC.  Otherwise, his disease has been relatively stable.  The alpha-fetoprotein has gone up to over 930.  I spoke to Dr. Sondra Come  of Radiation Oncology.  We will see if we cannot give more radiation to this right upper chest mass.    I will have to make a change in his treatment.  Unfortunate, we have things we have to take care of before we can do any treatment.  I think 1 option for him would be Cyramza.  This is approved by the NCCN guidelines for use in hepatocellular carcinoma therapy with an alpha-fetoprotein greater than 400.  I think this would be reasonable.  I just feel bad that he has all these issues right now.  I would have to say that his performance status is by ECOG 2.    Medications:  Allergies as of 05/01/2020   No Known Allergies     Medication List       Accurate as of May 01, 2020  2:04 PM. If you have any questions, ask your nurse or doctor.        acetaminophen 500 MG tablet Commonly known as: TYLENOL Take 500 mg by mouth every 8 (eight) hours as needed (pain).   amoxicillin 500 MG capsule Commonly known as: AMOXIL Prior to dental appointments   diazepam 2 MG tablet Commonly known as: VALIUM Take 1 tablet (2 mg total) by mouth every 12 (twelve)  hours as needed for anxiety.   famotidine 10 MG tablet Commonly known as: PEPCID Take 10 mg by mouth every morning.   furosemide 40 MG tablet Commonly known as: LASIX Take 1 tablet (40 mg total) by mouth daily. Take 1 hour AFTER metolazone Started by: Volanda Napoleon, MD   metolazone 5 MG tablet Commonly known as: ZAROXOLYN Take 1 tablet (5 mg total) by mouth daily. Take 1 hour BEFORE furosemide. Started by: Volanda Napoleon, MD   metoprolol tartrate 100 MG tablet Commonly known as: LOPRESSOR Take 1 tablet (100 mg total) by mouth 2 (two) times daily.   multivitamin with minerals Tabs tablet Take 1 tablet by mouth daily.   ondansetron 8 MG tablet Commonly known as: ZOFRAN Take by mouth every 8 (eight) hours as needed.   polyethylene glycol powder 17 GM/SCOOP powder Commonly known as: GLYCOLAX/MIRALAX Take 1 Container by  mouth daily as needed.   Potassium 99 MG Tabs Take 99 mg by mouth daily.   prochlorperazine 10 MG tablet Commonly known as: COMPAZINE Take 10 mg by mouth every 6 (six) hours as needed for nausea or vomiting.   rivaroxaban 20 MG Tabs tablet Commonly known as: Xarelto Take 1 tablet (20 mg total) by mouth daily with supper. What changed: Another medication with the same name was added. Make sure you understand how and when to take each. Changed by: Volanda Napoleon, MD   rivaroxaban 10 MG Tabs tablet Commonly known as: XARELTO Take 1 tablet (10 mg total) by mouth daily. What changed: You were already taking a medication with the same name, and this prescription was added. Make sure you understand how and when to take each. Changed by: Volanda Napoleon, MD   silver sulfADIAZINE 1 % cream Commonly known as: Silvadene Apply 1 application topically daily. Started by: Volanda Napoleon, MD   traMADol 50 MG tablet Commonly known as: ULTRAM TAKE 1 TABLET BY MOUTH EVERY 6 HOURS AS NEEDED   trolamine salicylate 10 % cream Commonly known as: ASPERCREME Apply 1 application topically as needed for muscle pain.   vitamin B-12 500 MCG tablet Commonly known as: CYANOCOBALAMIN Take 500 mcg by mouth daily.       Allergies: No Known Allergies  Past Medical History, Surgical history, Social history, and Family History were reviewed and updated.  Review of Systems: Review of Systems  Constitutional: Negative.   HENT: Negative.   Eyes: Negative.   Respiratory: Negative.   Cardiovascular: Negative.   Gastrointestinal: Negative.   Genitourinary: Negative.   Musculoskeletal: Positive for back pain, falls and joint pain.  Skin: Negative.   Neurological: Positive for focal weakness.  Endo/Heme/Allergies: Negative.   Psychiatric/Behavioral: Negative.      Physical Exam:  weight is 220 lb 8 oz (100 kg). His oral temperature is 97.6 F (36.4 C). His blood pressure is 103/77 and his pulse  is 101 (abnormal). His respiration is 18 and oxygen saturation is 100%.   Wt Readings from Last 3 Encounters:  05/01/20 220 lb 8 oz (100 kg)  04/09/20 216 lb (98 kg)  03/19/20 214 lb (97.1 kg)    Physical Exam Vitals reviewed.  HENT:     Head: Normocephalic and atraumatic.  Eyes:     Pupils: Pupils are equal, round, and reactive to light.  Cardiovascular:     Rate and Rhythm: Normal rate and regular rhythm.     Heart sounds: Normal heart sounds.  Pulmonary:     Effort: Pulmonary effort is  normal.     Breath sounds: Normal breath sounds.  Abdominal:     General: Bowel sounds are normal.     Palpations: Abdomen is soft.  Musculoskeletal:        General: No tenderness or deformity. Normal range of motion.     Cervical back: Normal range of motion.  Lymphadenopathy:     Cervical: No cervical adenopathy.  Skin:    General: Skin is warm and dry.     Findings: No erythema or rash.  Neurological:     Mental Status: He is alert and oriented to person, place, and time.  Psychiatric:        Behavior: Behavior normal.        Thought Content: Thought content normal.        Judgment: Judgment normal.    Lab Results  Component Value Date   WBC 6.4 05/01/2020   HGB 8.7 (L) 05/01/2020   HCT 29.2 (L) 05/01/2020   MCV 113.6 (H) 05/01/2020   PLT 143 (L) 05/01/2020   Lab Results  Component Value Date   FERRITIN 242 03/19/2020   IRON 89 03/19/2020   TIBC 259 03/19/2020   UIBC 170 03/19/2020   IRONPCTSAT 34 03/19/2020   Lab Results  Component Value Date   RETICCTPCT 1.9 01/27/2020   RBC 2.57 (L) 05/01/2020   Lab Results  Component Value Date   KPAFRELGTCHN 23.3 (H) 06/20/2019   LAMBDASER 19.9 06/20/2019   KAPLAMBRATIO 1.17 06/20/2019   Lab Results  Component Value Date   IGGSERUM 1,078 06/20/2019   IGA 457 (H) 06/20/2019   IGMSERUM 80 06/20/2019   Lab Results  Component Value Date   TOTALPROTELP 6.2 06/20/2019   ALBUMINELP 3.2 06/20/2019   A1GS 0.3 06/20/2019    A2GS 0.6 06/20/2019   BETS 1.2 06/20/2019   GAMS 0.9 06/20/2019   MSPIKE Not Observed 06/20/2019     Chemistry      Component Value Date/Time   NA 139 05/01/2020 1111   NA 137 05/13/2019 1110   K 4.4 05/01/2020 1111   CL 106 05/01/2020 1111   CO2 28 05/01/2020 1111   BUN 18 05/01/2020 1111   BUN 7 (L) 05/13/2019 1110   CREATININE 0.71 05/01/2020 1111      Component Value Date/Time   CALCIUM 8.4 (L) 05/01/2020 1111   ALKPHOS 191 (H) 05/01/2020 1111   AST 71 (H) 05/01/2020 1111   ALT 53 (H) 05/01/2020 1111   BILITOT 0.9 05/01/2020 1111      Impression and Plan: Mr. Skillman is a very pleasant 65 yo caucasian gentleman with metastatic hepatocellular carcinoma.   Again, he seems to have progressive disease.  Again we will have to see if Radiation Oncology will be able to help this.  We will have to make a change in his protocol.  He was on immunotherapy.  This really was not more effective.  I really wish that it would be more effective.  We will see if Cyramza might be able to be utilized.  Again he has a high alpha-fetoprotein so Cyramza is indicated in this scenario.  We will have to transfuse him.  He needs to have his GI tract looked at.  He is off his Xarelto right now.  He will be off of 5 days and then go on 10 mg a day.  He will be on diuretic protocol.  He does have fluid retention.  Some of this fluid retention might be from his anemia.  I am  not sure why we will be able to start the [place].  He has other issues at our fairly urgent right now that we really need to address.   Volanda Napoleon, MD 3/8/20222:04 PM

## 2020-05-01 NOTE — Telephone Encounter (Signed)
Returned call, Dr. Marin Olp answers reporting they are stopping Xarelto at this time d/t bleeding. Hgb 2/14 was 11.6, today it was 8.5 Pt is experiencing Melena. They are looking into source of bleeding. Will restart at lower dose if/when safe to do so at later date. Aware I will forward to Dr. Curt Bears for his FYI.

## 2020-05-01 NOTE — Telephone Encounter (Signed)
Patient c/o Palpitations:  High priority if patient c/o lightheadedness, shortness of breath, or chest pain  1) How long have you had palpitations/irregular HR/ Afib? Are you having the symptoms now?  Patient had and EKG that picked up afib, unsure how long patient has been in afib, per [place] with Dr. Antonieta Pert office   2) Are you currently experiencing lightheadedness, SOB or CP? No  3) Do you have a history of afib (atrial fibrillation) or irregular heart rhythm? Yes   4) Have you checked your BP or HR? (document readings if available):  03/08: BP-103/77 HR-101  5) Are you experiencing any other symptoms?  SOB  Pt c/o Shortness Of Breath: STAT if SOB developed within the last 24 hours or pt is noticeably SOB on the phone  1. Are you currently SOB (can you hear that pt is SOB on the phone)? N/a   2. How long have you been experiencing SOB? A few weeks on and off  3. Are you SOB when sitting or when up moving around? When up and moving around  4. Are you currently experiencing any other symptoms? Dizziness   STAT if patient feels like he/she is going to faint   1) Are you dizzy now? Patient is currently dizzy, per [place] with Dr. Antonieta Pert office  2) Do you feel faint or have you passed out? No   3) Do you have any other symptoms? No   4) Have you checked your HR and BP (record if available)?  03/08: BP-103/77 HR-101  [place] with Dr. Antonieta Pert office initially called in requesting to speak with Dr. Curt Bears or DOD. Dr. Curt Bears is not in the office today, so per protocol I attempted to contact DOD, Dr. Quentin Ore, but did not get an answer. I made [place] aware he may be at lunch and that I will route a message regarding the patient's symptoms. Please return call to discuss at [phone]. I made [place] aware that

## 2020-05-02 ENCOUNTER — Inpatient Hospital Stay: Payer: Medicare Other

## 2020-05-02 ENCOUNTER — Telehealth: Payer: Self-pay | Admitting: *Deleted

## 2020-05-02 ENCOUNTER — Encounter: Payer: Self-pay | Admitting: Gastroenterology

## 2020-05-02 DIAGNOSIS — C7800 Secondary malignant neoplasm of unspecified lung: Secondary | ICD-10-CM

## 2020-05-02 DIAGNOSIS — C220 Liver cell carcinoma: Secondary | ICD-10-CM

## 2020-05-02 DIAGNOSIS — I4891 Unspecified atrial fibrillation: Secondary | ICD-10-CM

## 2020-05-02 LAB — AFP TUMOR MARKER: AFP, Serum, Tumor Marker: 2234 ng/mL — ABNORMAL HIGH (ref 0.0–8.4)

## 2020-05-02 MED ORDER — DIPHENHYDRAMINE HCL 25 MG PO CAPS
25.0000 mg | ORAL_CAPSULE | Freq: Once | ORAL | Status: AC
  Administered 2020-05-02: 25 mg via ORAL

## 2020-05-02 MED ORDER — SODIUM CHLORIDE 0.9% IV SOLUTION
250.0000 mL | Freq: Once | INTRAVENOUS | Status: AC
  Administered 2020-05-02: 250 mL via INTRAVENOUS
  Filled 2020-05-02: qty 250

## 2020-05-02 MED ORDER — ACETAMINOPHEN 325 MG PO TABS
ORAL_TABLET | ORAL | Status: AC
  Filled 2020-05-02: qty 2

## 2020-05-02 MED ORDER — DIPHENHYDRAMINE HCL 25 MG PO CAPS
ORAL_CAPSULE | ORAL | Status: AC
  Filled 2020-05-02: qty 1

## 2020-05-02 NOTE — Progress Notes (Signed)
Patient here with multiple problems, and scans showing progression. He will return to the office tomorrow for a blood transfusion. He will need consult with Radiation Oncology and GI.   Radiation Oncology scheduled for 05/09/2020 GI Consult scheduled for 05/25/2020  Dr Marin Olp had asked GI to see patient this week. I reached out to Dr Vivia Ewing office and they will follow up with Dr C to move patient to this week. They will reach out to patient and try to have them come in tomorrow.   Oncology Nurse Navigator Documentation  Oncology Nurse Navigator Flowsheets 05/01/2020  Abnormal Finding Date -  Confirmed Diagnosis Date -  Diagnosis Status -  Planned Course of Treatment -  Phase of Treatment -  Chemotherapy Actual Start Date: -  Chemo/Radiation Concurrent Pending- Reason: -  Radiation Actual Start Date: -  Navigator Follow Up Date: 05/09/2020  Navigator Follow Up Reason: Radiation  Navigator Location [facility]  Referral Date to RadOnc/MedOnc -  Navigator Encounter Type Follow-up Appt;Appt/Treatment Plan Review  Telephone -  Treatment Initiated Date -  Patient Visit Type MedOnc  Treatment Phase Active Tx  Barriers/Navigation Needs Anxiety;Mobility Issues;Morbidities/Frailty;Pain  Education Other  Interventions Coordination of Care;Education;Psycho-Social Support  Acuity Level 2-Minimal Needs (1-2 Barriers Identified)  Referrals Radiation Oncology;Other  Coordination of Care Other  Education Method Verbal  Support Groups/Services Friends and Family  Time Spent with Patient 52

## 2020-05-02 NOTE — Patient Instructions (Signed)

## 2020-05-02 NOTE — Progress Notes (Signed)
Reviewed pt discharge vitals with Dr. Marin Olp and ok for patient to leave. Pt states he feels better than when he arrived. Pt aware of high need to follow up with GI and Cardiology. Pt verbalized understanding and had no further questions.

## 2020-05-02 NOTE — Telephone Encounter (Signed)
No los per 05/01/20 to be scheduled rdf 

## 2020-05-03 ENCOUNTER — Ambulatory Visit (INDEPENDENT_AMBULATORY_CARE_PROVIDER_SITE_OTHER): Payer: Medicare Other | Admitting: Gastroenterology

## 2020-05-03 ENCOUNTER — Encounter: Payer: Self-pay | Admitting: Gastroenterology

## 2020-05-03 VITALS — BP 96/70 | HR 70 | Ht 73.0 in | Wt 223.0 lb

## 2020-05-03 DIAGNOSIS — C7951 Secondary malignant neoplasm of bone: Secondary | ICD-10-CM | POA: Diagnosis not present

## 2020-05-03 DIAGNOSIS — C7800 Secondary malignant neoplasm of unspecified lung: Secondary | ICD-10-CM | POA: Diagnosis not present

## 2020-05-03 DIAGNOSIS — D649 Anemia, unspecified: Secondary | ICD-10-CM

## 2020-05-03 DIAGNOSIS — R188 Other ascites: Secondary | ICD-10-CM

## 2020-05-03 DIAGNOSIS — I4819 Other persistent atrial fibrillation: Secondary | ICD-10-CM

## 2020-05-03 DIAGNOSIS — Z7901 Long term (current) use of anticoagulants: Secondary | ICD-10-CM

## 2020-05-03 DIAGNOSIS — C220 Liver cell carcinoma: Secondary | ICD-10-CM

## 2020-05-03 DIAGNOSIS — D500 Iron deficiency anemia secondary to blood loss (chronic): Secondary | ICD-10-CM

## 2020-05-03 DIAGNOSIS — R0609 Other forms of dyspnea: Secondary | ICD-10-CM

## 2020-05-03 DIAGNOSIS — R0600 Dyspnea, unspecified: Secondary | ICD-10-CM

## 2020-05-03 DIAGNOSIS — C778 Secondary and unspecified malignant neoplasm of lymph nodes of multiple regions: Secondary | ICD-10-CM

## 2020-05-03 DIAGNOSIS — R600 Localized edema: Secondary | ICD-10-CM

## 2020-05-03 LAB — TYPE AND SCREEN
ABO/RH(D): B POS
Antibody Screen: NEGATIVE
Unit division: 0
Unit division: 0

## 2020-05-03 LAB — BPAM RBC
Blood Product Expiration Date: 202204102359
Blood Product Expiration Date: 202204102359
ISSUE DATE / TIME: 202203090746
ISSUE DATE / TIME: 202203090746
Unit Type and Rh: 7300
Unit Type and Rh: 7300

## 2020-05-03 MED ORDER — PANTOPRAZOLE SODIUM 40 MG PO TBEC: 40.0000 mg | DELAYED_RELEASE_TABLET | Freq: Two times a day (BID) | ORAL | 3 refills | Status: AC

## 2020-05-03 NOTE — H&P (View-Only) (Signed)
Chief Complaint: Anemia, GI bleed   Referring Provider:     Burney Gauze, MD   HPI:     Kerry Adams is a 65 y.o. male with a history of hepatocellular carcinoma with metastasis to bone, lung, lymph nodes referred to the [facility]ology [facility] for evaluation of iron deficiency anemia/symptomatic anemia.  Was seen by Dr. Marin Olp on 05/01/2020.  Hemoglobin was down at 8.7 (from 11.6 on 04/09/2020) and elevated MCV at 113.  BUN/creatinine normal.  Liver enzymes largely unchanged. Was transfused 2 units PRBCs yesterday and Xarelto (hx of Afib) was stopped 2 days ago with plan to hold x5 days then go to 10 mg/day per discussion between Dr. Marin Olp and his Cardiologist.  Was also prescribed metolazone and Lasix due to fluid retention/edema (planning to start tomorrow).  Most recent CT CAP on 04/26/2020 demonstrates progression of RUL lung mass measuring 10.9 x 8.4 x 12.2 cm, which invades chest wall and displaces subclavian and axillary vessels and adjacent to SVC.  Cirrhotic liver with 1.2 cm lesion in dome of right hepatic lobe and several hepatic metastatic lesions.  +ascites and mesenteric edema.    Has appointment with Radiation Oncology on 05/09/2020 to evaluate for further radiation of right upper chest mass.  AFP now 2234, which is a steep incline from 932 last month.  Had some increase in energy after yesterday's transfusion, but still overall weak and DOE.   Did have large volume hematochezia about 2-3 weeks ago, lasting intermittently about 1 week, which has since resolved. No associated pain or dyschezia.   Will wake most mornings with mouth/nose full of blood and clots for the last 4-5 weeks per wife. Had a neighbor (dental hygeinist) evaluate and couldn't locate any source of active bleeding.   Decreased hunger for the last 2-3 weeks with associated DOE, LE edema, ascites. Hasnt yet started the diuretics prescribed earlier this week (wasn't available at pharmacy until  today). 20# increase in last 6 months or so.   No previous EGD or colonoscopy.  CBC Latest Ref Rng & Units 05/01/2020 04/09/2020 03/19/2020  WBC 4.0 - 10.5 K/uL 6.4 5.3 3.6(L)  Hemoglobin 13.0 - 17.0 g/dL 8.7(L) 11.6(L) 12.4(L)  Hematocrit 39.0 - 52.0 % 29.2(L) 36.4(L) 38.6(L)  Platelets 150 - 400 K/uL 143(L) 140(L) 115(L)   CMP Latest Ref Rng & Units 05/01/2020 04/09/2020 03/19/2020  Glucose 70 - 99 mg/dL 178(H) 137(H) 131(H)  BUN 8 - 23 mg/dL 18 12 11   Creatinine 0.61 - 1.24 mg/dL 0.71 0.62 0.58(L)  Sodium 135 - 145 mmol/L 139 139 139  Potassium 3.5 - 5.1 mmol/L 4.4 3.9 4.2  Chloride 98 - 111 mmol/L 106 106 108  CO2 22 - 32 mmol/L 28 26 27   Calcium 8.9 - 10.3 mg/dL 8.4(L) 8.8(L) 8.8(L)  Total Protein 6.5 - 8.1 g/dL 5.8(L) 5.6(L) 5.8(L)  Total Bilirubin 0.3 - 1.2 mg/dL 0.9 0.9 1.3(H)  Alkaline Phos 38 - 126 U/L 191(H) 203(H) 209(H)  AST 15 - 41 U/L 71(H) 70(H) 90(H)  ALT 0 - 44 U/L 53(H) 37 38      Past Medical History:  Diagnosis Date  . Abnormal EKG   . Alcohol use 02/18/2014  . Arthritis    osteo knees and hips   . Atrial fibrillation ([place])   . CAD (coronary artery disease)    WITHOUT ANGINA PECTORIS, UNSPECIFIED VESSEL OR LESION TYPE, UNSPECIFIED WHETHER NATIVE OR TRANSPLANTED HEART  . 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 Chronic liver disease   . Cirrhosis ([place])   . Closed right hip fracture ([place]) 02/18/2014  . Dizziness   . Dysrhythmia   . Elevated liver function tests   . GERD (gastroesophageal reflux disease)    treats with OTC Ranitidine  . Goals of care, counseling/discussion 06/20/2019  . Hepatocellular carcinoma metastatic to lung ([place]) 07/08/2019  . Hepatocellular carcinoma metastatic to lymph nodes of multiple sites ([place]) 07/08/2019  . Hip fracture requiring operative repair ([place]) 02/19/2014  . Hyperlipidemia   . Hypertension   . Iron deficiency anemia due to chronic blood loss 02/20/2020  . Metastasis to right adrenal gland of unknown origin  ([place]) 06/20/2019  . 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 Osteoarthritis of knee, unilateral 03/01/2015  . Primary osteoarthritis of right knee 05/23/2015  . Thrombocytopenia ([place])      Past Surgical History:  Procedure Laterality Date  . CARDIOVERSION N/A 09/13/2018   Procedure: CARDIOVERSION;  Surgeon: Acie Fredrickson Wonda Cheng, MD;  Location[facility] ENDOSCOPY;  Service: Cardiovascular;  Laterality: N/A;  . FRACTURE SURGERY     right femur  . HERNIA REPAIR     umbilical  . INTERTROCHANTERIC HIP FRACTURE SURGERY Right 02/19/2014  . INTRAMEDULLARY (IM) NAIL INTERTROCHANTERIC Right 02/19/2014   Procedure: INTRAMEDULLARY (IM) NAIL INTERTROCHANTRIC;  Surgeon: [name], MD;  Location: [place];  Service: Orthopedics;  Laterality: Right;  . SHOULDER SURGERY Right   . TOTAL KNEE ARTHROPLASTY Left 03/01/2015   Procedure: TOTAL KNEE ARTHROPLASTY;  Surgeon: [name], MD;  Location: [place];  Service: Orthopedics;  Laterality: Left;  Left knee arthroplasty  . TOTAL KNEE ARTHROPLASTY Right 05/23/2015   Procedure: TOTAL KNEE ARTHROPLASTY W/HARDWARE REMOVAL FEMUR;  Surgeon: Frederik Pear, MD;  Location: [place];  Service: Orthopedics;  Laterality: Right;   Family History  Problem Relation Age of Onset  . Hypertension Mother   . Stroke Father   . Healthy Brother   . Colon cancer Neg Hx   . Colon polyps Neg Hx   . Esophageal cancer Neg Hx    Social History   Tobacco Use  . Smoking status: Never Smoker  . Smokeless tobacco: Never Used  Vaping Use  . Vaping Use: Never used  Substance Use Topics  . Alcohol use: Not Currently    Alcohol/week: 32.0 standard drinks    Types: 32 Cans of beer per week    Comment: Zero since 08/25/19.  . Drug use: No   Current Outpatient Medications  Medication Sig Dispense Refill  . acetaminophen (TYLENOL) 500 MG tablet Take 500 mg by mouth every 8 (eight) hours as needed (pain).     Marland Kitchen amoxicillin  (AMOXIL) 500 MG capsule Prior to dental appointments    . diazepam (VALIUM) 2 MG tablet Take 1 tablet (2 mg total) by mouth every 12 (twelve) hours as needed for anxiety. 14 tablet 0  . famotidine (PEPCID) 10 MG tablet Take 10 mg by mouth every morning.     . furosemide (LASIX) 40 MG tablet Take 1 tablet (40 mg total) by mouth daily. Take 1 hour AFTER metolazone 30 tablet 3  . metolazone (ZAROXOLYN) 5 MG tablet Take 1 tablet (5 mg total) by mouth daily. Take 1 hour BEFORE furosemide. 30 tablet 4  . metoprolol tartrate (LOPRESSOR) 100 MG tablet Take 1 tablet (100 mg total) by mouth 2 (two) times daily. 180 tablet 3  . Multiple Vitamin (  MULTIVITAMIN WITH MINERALS) TABS tablet Take 1 tablet by mouth daily.    . ondansetron (ZOFRAN) 8 MG tablet Take by mouth every 8 (eight) hours as needed.    . polyethylene glycol powder (GLYCOLAX/MIRALAX) 17 GM/SCOOP powder Take 1 Container by mouth daily as needed.    . Potassium 99 MG TABS Take 99 mg by mouth daily.    . prochlorperazine (COMPAZINE) 10 MG tablet Take 10 mg by mouth every 6 (six) hours as needed for nausea or vomiting.    . rivaroxaban (XARELTO) 10 MG TABS tablet Take 1 tablet (10 mg total) by mouth daily. 30 tablet 3  . rivaroxaban (XARELTO) 20 MG TABS tablet Take 1 tablet (20 mg total) by mouth daily with supper. 30 tablet 6  . silver sulfADIAZINE (SILVADENE) 1 % cream Apply 1 application topically daily. 400 g 1  . traMADol (ULTRAM) 50 MG tablet TAKE 1 TABLET BY MOUTH EVERY 6 HOURS AS NEEDED 60 tablet 0  . trolamine salicylate (ASPERCREME) 10 % cream Apply 1 application topically as needed for muscle pain.    . vitamin B-12 (CYANOCOBALAMIN) 500 MCG tablet Take 500 mcg by mouth daily.     No current facility-administered medications for this visit.   No Known Allergies   Review of Systems: All systems reviewed and negative except where noted in HPI.     Physical Exam:    Wt Readings from Last 3 Encounters:  05/03/20 223 lb (101.2 kg)   05/01/20 220 lb 8 oz (100 kg)  04/09/20 216 lb (98 kg)    BP 96/70   Pulse 70   Ht 6\' 1"  (1.854 m)   Wt 223 lb (101.2 kg)   BMI 29.42 kg/m  Constitutional:  Pleasant, in no acute distress. Psychiatric: Normal mood and affect. Behavior is normal. Cardiovascular: Irregularly irregular. No edema Pulmonary/chest: Effort normal and breath sounds normal. No wheezing, rales or rhonchi. Abdominal: Pitting edema/anasarca, distended but not tense.  Nontender. Bowel sounds active throughout.  Neurological: Alert and oriented to person place and time. Skin: L>R b/l LE edema, edema of b/l hands   ASSESSMENT AND PLAN;   1) Acute on chronic anemia 2) GI blood loss 3) Metastatic HCC 4) large right sided chest mass  -Start Protonix 40 mg PO BID -Expedited EGD at [facility] tomorrow for diagnostic and therapeutic intent -Rapid preoperative Covid test at [facility] tomorrow morning -Had a long discussion today regarding performing both EGD and colonoscopy.  He and his wife both have elevated concerns about him tolerating bowel preparation, with specific concerns of volume/fluid shifts, mobility issues getting to the bathroom, and high concerns for UGI source.  Discussed elevated periprocedural risks given underlying comorbidities, and elevated risks of multiple sedating procedures.  They understand, and would like to proceed with EGD alone for now.  Respecting his wishes, plan for expedited EGD tomorrow as above -Follow-up posttransfusion CBC early next week -Given elevated periprocedural risks, plan for procedure to be done at [place] hold for now -If EGD unrevealing, plan for colonoscopy in the very near future -Has appointment with Radiation Oncology next week -Continue close follow-up with Oncology  5) Cirrhosis 6) Ascites 7) Lower extremity edema/anasarca 8) Decreased appetite -Start diuretics as prescribed by his Oncologist -Evaluate for portal hypertensive changes with  EGD tomorrow  9) Dyspnea on exertion -Improved after yesterday's PRBC transfusion.  Continuing to monitor  10) Atrial fibrillation 11) Systemic anticoagulation -Holding Xarelto for the last 2 days.  Continue hold for 5  days total as already planned with his Oncologist and Cardiologist  The indications, risks, and benefits of EGD and colonoscopy were explained to the patient in detail. Risks include but are not limited to bleeding, perforation, adverse reaction to medications, and cardiopulmonary compromise. Sequelae include but are not limited to the possibility of surgery, hospitalization, and mortality. The patient verbalized understanding and wished to proceed with EGD alone for now. All questions answered.  Further recommendations pending results of the exam.   I spent 60 minutes of time, including in depth chart review, independent review of results as outlined above, communicating results with the patient directly, face-to-face time with the patient, coordinating care, ordering studies and medications as appropriate, and documentation.      Lavena Bullion, DO, FACG  05/03/2020, 1:44 PM   Vicenta Aly, FNP

## 2020-05-03 NOTE — Patient Instructions (Addendum)
If you are age 66 or older, your body mass index should be between 23-30. Your Body mass index is 29.42 kg/m. If this is out of the aforementioned range listed, please consider follow up with your Primary Care Provider.  If you are age 72 or younger, your body mass index should be between 19-25. Your Body mass index is 29.42 kg/m. If this is out of the aformentioned range listed, please consider follow up with your Primary Care Provider.   We have sent the following medications to your pharmacy for you to pick up at your convenience:  Protonix 40 mg twice daily  Due to recent changes in healthcare laws, you may see the results of your imaging and laboratory studies on MyChart before your provider has had a chance to review them.  We understand that in some cases there may be results that are confusing or concerning to you. Not all laboratory results come back in the same time frame and the provider may be waiting for multiple results in order to interpret others.  Please give [place] 48 hours in order for your provider to thoroughly review all the results before contacting the office for clarification of your results.   Thank you for choosing me and [facility].  Vito Cirigliano, D.O.

## 2020-05-03 NOTE — Progress Notes (Signed)
Histology and Location of Primary Cancer: Hepatocellular carcinoma -- mets to bone, lung, lymph nodes   Location(s) of Symptomatic Metastases:  mets to bone, lung, lymph nodes   Past/Anticipated chemotherapy by medical oncology, if any:  Palliative Radiation to the RIGHT femur/ chest wall Tecentriq/Avastin -- started 08/05/2019, s/p cycle #9  -- d/c on 02/20/2020 due to progressive cancer Nivolumab/Yervoy -- d/c on 05/01/2020 Xgeva 120 mg sq q 3 months - next dose in 04/2020 Pain on a scale of 0-10 is:    If Spine Met(s), symptoms, if any, include:  Bowel/Bladder retention or incontinence Rectal bleeding  Numbness or weakness in extremities (please describe):general weakness  Current Decadron regimen, if applicable:  No  Ambulatory status? Walker? Wheelchair?:Patient can ambulate but he uses a walker and wheelchair SAFETY ISSUES:  Prior radiation? Yes .May 2021 Hip October 2021 [place]  Pacemaker/ICD? No  Possible current pregnancy?  No  Is the patient on methotrexate?   No Current Complaints / other details: Vitals:   05/09/20 1301  BP: 108/73  Pulse: (!) 110  Resp: 20  Temp: (!) 97 F (36.1 C)  TempSrc: Temporal  SpO2: 92%  Weight: 98.9 kg  Height: _0  (1.854 m)

## 2020-05-03 NOTE — Progress Notes (Signed)
Chief Complaint: Anemia, GI bleed   Referring Provider:     Burney Gauze, MD   HPI:     Kerry Adams is a 65 y.o. male with a history of hepatocellular carcinoma with metastasis to bone, lung, lymph nodes referred to the [facility]ology [facility] for evaluation of iron deficiency anemia/symptomatic anemia.  Was seen by Dr. Marin Olp on 05/01/2020.  Hemoglobin was down at 8.7 (from 11.6 on 04/09/2020) and elevated MCV at 113.  BUN/creatinine normal.  Liver enzymes largely unchanged. Was transfused 2 units PRBCs yesterday and Xarelto (hx of Afib) was stopped 2 days ago with plan to hold x5 days then go to 10 mg/day per discussion between Dr. Marin Olp and his Cardiologist.  Was also prescribed metolazone and Lasix due to fluid retention/edema (planning to start tomorrow).  Most recent CT CAP on 04/26/2020 demonstrates progression of RUL lung mass measuring 10.9 x 8.4 x 12.2 cm, which invades chest wall and displaces subclavian and axillary vessels and adjacent to SVC.  Cirrhotic liver with 1.2 cm lesion in dome of right hepatic lobe and several hepatic metastatic lesions.  +ascites and mesenteric edema.    Has appointment with Radiation Oncology on 05/09/2020 to evaluate for further radiation of right upper chest mass.  AFP now 2234, which is a steep incline from 932 last month.  Had some increase in energy after yesterday's transfusion, but still overall weak and DOE.   Did have large volume hematochezia about 2-3 weeks ago, lasting intermittently about 1 week, which has since resolved. No associated pain or dyschezia.   Will wake most mornings with mouth/nose full of blood and clots for the last 4-5 weeks per wife. Had a neighbor (dental hygeinist) evaluate and couldn't locate any source of active bleeding.   Decreased hunger for the last 2-3 weeks with associated DOE, LE edema, ascites. Hasnt yet started the diuretics prescribed earlier this week (wasn't available at pharmacy until  today). 20# increase in last 6 months or so.   No previous EGD or colonoscopy.  CBC Latest Ref Rng & Units 05/01/2020 04/09/2020 03/19/2020  WBC 4.0 - 10.5 K/uL 6.4 5.3 3.6(L)  Hemoglobin 13.0 - 17.0 g/dL 8.7(L) 11.6(L) 12.4(L)  Hematocrit 39.0 - 52.0 % 29.2(L) 36.4(L) 38.6(L)  Platelets 150 - 400 K/uL 143(L) 140(L) 115(L)   CMP Latest Ref Rng & Units 05/01/2020 04/09/2020 03/19/2020  Glucose 70 - 99 mg/dL 178(H) 137(H) 131(H)  BUN 8 - 23 mg/dL 18 12 11   Creatinine 0.61 - 1.24 mg/dL 0.71 0.62 0.58(L)  Sodium 135 - 145 mmol/L 139 139 139  Potassium 3.5 - 5.1 mmol/L 4.4 3.9 4.2  Chloride 98 - 111 mmol/L 106 106 108  CO2 22 - 32 mmol/L 28 26 27   Calcium 8.9 - 10.3 mg/dL 8.4(L) 8.8(L) 8.8(L)  Total Protein 6.5 - 8.1 g/dL 5.8(L) 5.6(L) 5.8(L)  Total Bilirubin 0.3 - 1.2 mg/dL 0.9 0.9 1.3(H)  Alkaline Phos 38 - 126 U/L 191(H) 203(H) 209(H)  AST 15 - 41 U/L 71(H) 70(H) 90(H)  ALT 0 - 44 U/L 53(H) 37 38      Past Medical History:  Diagnosis Date  . Abnormal EKG   . Alcohol use 02/18/2014  . Arthritis    osteo knees and hips   . Atrial fibrillation ([place])   . CAD (coronary artery disease)    WITHOUT ANGINA PECTORIS, UNSPECIFIED VESSEL OR LESION TYPE, UNSPECIFIED WHETHER NATIVE OR TRANSPLANTED HEART  . 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 Chronic liver disease   . Cirrhosis ([place])   . Closed right hip fracture ([place]) 02/18/2014  . Dizziness   . Dysrhythmia   . Elevated liver function tests   . GERD (gastroesophageal reflux disease)    treats with OTC Ranitidine  . Goals of care, counseling/discussion 06/20/2019  . Hepatocellular carcinoma metastatic to lung ([place]) 07/08/2019  . Hepatocellular carcinoma metastatic to lymph nodes of multiple sites ([place]) 07/08/2019  . Hip fracture requiring operative repair ([place]) 02/19/2014  . Hyperlipidemia   . Hypertension   . Iron deficiency anemia due to chronic blood loss 02/20/2020  . Metastasis to right adrenal gland of unknown origin  ([place]) 06/20/2019  . 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 Osteoarthritis of knee, unilateral 03/01/2015  . Primary osteoarthritis of right knee 05/23/2015  . Thrombocytopenia ([place])      Past Surgical History:  Procedure Laterality Date  . CARDIOVERSION N/A 09/13/2018   Procedure: CARDIOVERSION;  Surgeon: Acie Fredrickson Wonda Cheng, MD;  Location[facility] ENDOSCOPY;  Service: Cardiovascular;  Laterality: N/A;  . FRACTURE SURGERY     right femur  . HERNIA REPAIR     umbilical  . INTERTROCHANTERIC HIP FRACTURE SURGERY Right 02/19/2014  . INTRAMEDULLARY (IM) NAIL INTERTROCHANTERIC Right 02/19/2014   Procedure: INTRAMEDULLARY (IM) NAIL INTERTROCHANTRIC;  Surgeon: [name], MD;  Location: [place];  Service: Orthopedics;  Laterality: Right;  . SHOULDER SURGERY Right   . TOTAL KNEE ARTHROPLASTY Left 03/01/2015   Procedure: TOTAL KNEE ARTHROPLASTY;  Surgeon: [name], MD;  Location: [place];  Service: Orthopedics;  Laterality: Left;  Left knee arthroplasty  . TOTAL KNEE ARTHROPLASTY Right 05/23/2015   Procedure: TOTAL KNEE ARTHROPLASTY W/HARDWARE REMOVAL FEMUR;  Surgeon: Frederik Pear, MD;  Location: [place];  Service: Orthopedics;  Laterality: Right;   Family History  Problem Relation Age of Onset  . Hypertension Mother   . Stroke Father   . Healthy Brother   . Colon cancer Neg Hx   . Colon polyps Neg Hx   . Esophageal cancer Neg Hx    Social History   Tobacco Use  . Smoking status: Never Smoker  . Smokeless tobacco: Never Used  Vaping Use  . Vaping Use: Never used  Substance Use Topics  . Alcohol use: Not Currently    Alcohol/week: 32.0 standard drinks    Types: 32 Cans of beer per week    Comment: Zero since 08/25/19.  . Drug use: No   Current Outpatient Medications  Medication Sig Dispense Refill  . acetaminophen (TYLENOL) 500 MG tablet Take 500 mg by mouth every 8 (eight) hours as needed (pain).     Marland Kitchen amoxicillin  (AMOXIL) 500 MG capsule Prior to dental appointments    . diazepam (VALIUM) 2 MG tablet Take 1 tablet (2 mg total) by mouth every 12 (twelve) hours as needed for anxiety. 14 tablet 0  . famotidine (PEPCID) 10 MG tablet Take 10 mg by mouth every morning.     . furosemide (LASIX) 40 MG tablet Take 1 tablet (40 mg total) by mouth daily. Take 1 hour AFTER metolazone 30 tablet 3  . metolazone (ZAROXOLYN) 5 MG tablet Take 1 tablet (5 mg total) by mouth daily. Take 1 hour BEFORE furosemide. 30 tablet 4  . metoprolol tartrate (LOPRESSOR) 100 MG tablet Take 1 tablet (100 mg total) by mouth 2 (two) times daily. 180 tablet 3  . Multiple Vitamin (  MULTIVITAMIN WITH MINERALS) TABS tablet Take 1 tablet by mouth daily.    . ondansetron (ZOFRAN) 8 MG tablet Take by mouth every 8 (eight) hours as needed.    . polyethylene glycol powder (GLYCOLAX/MIRALAX) 17 GM/SCOOP powder Take 1 Container by mouth daily as needed.    . Potassium 99 MG TABS Take 99 mg by mouth daily.    . prochlorperazine (COMPAZINE) 10 MG tablet Take 10 mg by mouth every 6 (six) hours as needed for nausea or vomiting.    . rivaroxaban (XARELTO) 10 MG TABS tablet Take 1 tablet (10 mg total) by mouth daily. 30 tablet 3  . rivaroxaban (XARELTO) 20 MG TABS tablet Take 1 tablet (20 mg total) by mouth daily with supper. 30 tablet 6  . silver sulfADIAZINE (SILVADENE) 1 % cream Apply 1 application topically daily. 400 g 1  . traMADol (ULTRAM) 50 MG tablet TAKE 1 TABLET BY MOUTH EVERY 6 HOURS AS NEEDED 60 tablet 0  . trolamine salicylate (ASPERCREME) 10 % cream Apply 1 application topically as needed for muscle pain.    . vitamin B-12 (CYANOCOBALAMIN) 500 MCG tablet Take 500 mcg by mouth daily.     No current facility-administered medications for this visit.   No Known Allergies   Review of Systems: All systems reviewed and negative except where noted in HPI.     Physical Exam:    Wt Readings from Last 3 Encounters:  05/03/20 223 lb (101.2 kg)   05/01/20 220 lb 8 oz (100 kg)  04/09/20 216 lb (98 kg)    BP 96/70   Pulse 70   Ht 6\' 1"  (1.854 m)   Wt 223 lb (101.2 kg)   BMI 29.42 kg/m  Constitutional:  Pleasant, in no acute distress. Psychiatric: Normal mood and affect. Behavior is normal. Cardiovascular: Irregularly irregular. No edema Pulmonary/chest: Effort normal and breath sounds normal. No wheezing, rales or rhonchi. Abdominal: Pitting edema/anasarca, distended but not tense.  Nontender. Bowel sounds active throughout.  Neurological: Alert and oriented to person place and time. Skin: L>R b/l LE edema, edema of b/l hands   ASSESSMENT AND PLAN;   1) Acute on chronic anemia 2) GI blood loss 3) Metastatic HCC 4) large right sided chest mass  -Start Protonix 40 mg PO BID -Expedited [facility] tomorrow for diagnostic and therapeutic intent -Rapid preoperative Covid test at [facility] tomorrow morning -Had a long discussion today regarding performing both EGD and colonoscopy.  He and his wife both have elevated concerns about him tolerating bowel preparation, with specific concerns of volume/fluid shifts, mobility issues getting to the bathroom, and high concerns for UGI source.  Discussed elevated periprocedural risks given underlying comorbidities, and elevated risks of multiple sedating procedures.  They understand, and would like to proceed with EGD alone for now.  Respecting his wishes, plan for expedited EGD tomorrow as above -Follow-up posttransfusion CBC early next week -Given elevated periprocedural risks, plan for procedure to be done at [place] hold for now -If EGD unrevealing, plan for colonoscopy in the very near future -Has appointment with Radiation Oncology next week -Continue close follow-up with Oncology  5) Cirrhosis 6) Ascites 7) Lower extremity edema/anasarca 8) Decreased appetite -Start diuretics as prescribed by his Oncologist -Evaluate for portal hypertensive changes with  EGD tomorrow  9) Dyspnea on exertion -Improved after yesterday's PRBC transfusion.  Continuing to monitor  10) Atrial fibrillation 11) Systemic anticoagulation -Holding Xarelto for the last 2 days.  Continue hold for 5  days total as already planned with his Oncologist and Cardiologist  The indications, risks, and benefits of EGD and colonoscopy were explained to the patient in detail. Risks include but are not limited to bleeding, perforation, adverse reaction to medications, and cardiopulmonary compromise. Sequelae include but are not limited to the possibility of surgery, hospitalization, and mortality. The patient verbalized understanding and wished to proceed with EGD alone for now. All questions answered.  Further recommendations pending results of the exam.   I spent 60 minutes of time, including in depth chart review, independent review of results as outlined above, communicating results with the patient directly, face-to-face time with the patient, coordinating care, ordering studies and medications as appropriate, and documentation.      Lavena Bullion, DO, FACG  05/03/2020, 1:44 PM   Vicenta Aly, FNP

## 2020-05-04 ENCOUNTER — Ambulatory Visit (HOSPITAL_COMMUNITY): Payer: Medicare Other | Admitting: Anesthesiology

## 2020-05-04 ENCOUNTER — Encounter (HOSPITAL_COMMUNITY)
Admission: RE | Disposition: A | Discharge status: Home / Self Care | Payer: Self-pay | Source: Home / Self Care | Attending: Gastroenterology

## 2020-05-04 ENCOUNTER — Ambulatory Visit (HOSPITAL_COMMUNITY)
Admission: RE | Admit: 2020-05-04 | Discharge: 2020-05-04 | Disposition: A | Discharge status: Home / Self Care | Payer: Medicare Other | Attending: Gastroenterology | Admitting: Gastroenterology

## 2020-05-04 ENCOUNTER — Encounter: Payer: Self-pay | Admitting: *Deleted

## 2020-05-04 ENCOUNTER — Other Ambulatory Visit: Payer: Self-pay

## 2020-05-04 ENCOUNTER — Encounter (HOSPITAL_COMMUNITY): Payer: Self-pay | Admitting: Gastroenterology

## 2020-05-04 DIAGNOSIS — R600 Localized edema: Secondary | ICD-10-CM | POA: Diagnosis not present

## 2020-05-04 DIAGNOSIS — Z79899 Other long term (current) drug therapy: Secondary | ICD-10-CM | POA: Diagnosis not present

## 2020-05-04 DIAGNOSIS — K259 Gastric ulcer, unspecified as acute or chronic, without hemorrhage or perforation: Secondary | ICD-10-CM

## 2020-05-04 DIAGNOSIS — K31A11 Gastric intestinal metaplasia without dysplasia, involving the antrum: Secondary | ICD-10-CM | POA: Insufficient documentation

## 2020-05-04 DIAGNOSIS — K2951 Unspecified chronic gastritis with bleeding: Secondary | ICD-10-CM | POA: Diagnosis not present

## 2020-05-04 DIAGNOSIS — K921 Melena: Secondary | ICD-10-CM | POA: Diagnosis not present

## 2020-05-04 DIAGNOSIS — C778 Secondary and unspecified malignant neoplasm of lymph nodes of multiple regions: Secondary | ICD-10-CM | POA: Diagnosis not present

## 2020-05-04 DIAGNOSIS — Z20822 Contact with and (suspected) exposure to covid-19: Secondary | ICD-10-CM | POA: Diagnosis not present

## 2020-05-04 DIAGNOSIS — I4891 Unspecified atrial fibrillation: Secondary | ICD-10-CM | POA: Insufficient documentation

## 2020-05-04 DIAGNOSIS — K766 Portal hypertension: Secondary | ICD-10-CM | POA: Insufficient documentation

## 2020-05-04 DIAGNOSIS — R222 Localized swelling, mass and lump, trunk: Secondary | ICD-10-CM | POA: Insufficient documentation

## 2020-05-04 DIAGNOSIS — C7951 Secondary malignant neoplasm of bone: Secondary | ICD-10-CM | POA: Insufficient documentation

## 2020-05-04 DIAGNOSIS — D649 Anemia, unspecified: Secondary | ICD-10-CM

## 2020-05-04 DIAGNOSIS — Z96653 Presence of artificial knee joint, bilateral: Secondary | ICD-10-CM | POA: Diagnosis not present

## 2020-05-04 DIAGNOSIS — Z7901 Long term (current) use of anticoagulants: Secondary | ICD-10-CM | POA: Diagnosis not present

## 2020-05-04 DIAGNOSIS — I8510 Secondary esophageal varices without bleeding: Secondary | ICD-10-CM | POA: Diagnosis not present

## 2020-05-04 DIAGNOSIS — K2970 Gastritis, unspecified, without bleeding: Secondary | ICD-10-CM

## 2020-05-04 DIAGNOSIS — D62 Acute posthemorrhagic anemia: Secondary | ICD-10-CM

## 2020-05-04 DIAGNOSIS — R0609 Other forms of dyspnea: Secondary | ICD-10-CM | POA: Diagnosis not present

## 2020-05-04 DIAGNOSIS — R188 Other ascites: Secondary | ICD-10-CM | POA: Insufficient documentation

## 2020-05-04 DIAGNOSIS — C7800 Secondary malignant neoplasm of unspecified lung: Secondary | ICD-10-CM | POA: Diagnosis not present

## 2020-05-04 DIAGNOSIS — C220 Liver cell carcinoma: Secondary | ICD-10-CM | POA: Diagnosis not present

## 2020-05-04 DIAGNOSIS — K3189 Other diseases of stomach and duodenum: Secondary | ICD-10-CM

## 2020-05-04 HISTORY — PX: BIOPSY: SHX5522

## 2020-05-04 HISTORY — PX: ESOPHAGOGASTRODUODENOSCOPY (EGD) WITH PROPOFOL: SHX5813

## 2020-05-04 LAB — SARS CORONAVIRUS 2 BY RT PCR (HOSPITAL ORDER, PERFORMED IN ~~LOC~~ HOSPITAL LAB): SARS Coronavirus 2: NEGATIVE

## 2020-05-04 SURGERY — ESOPHAGOGASTRODUODENOSCOPY (EGD) WITH PROPOFOL
Anesthesia: Monitor Anesthesia Care

## 2020-05-04 MED ORDER — SODIUM CHLORIDE 0.9 % IV SOLN: INTRAVENOUS | Status: DC

## 2020-05-04 MED ORDER — PROPOFOL 10 MG/ML IV BOLUS
INTRAVENOUS | Status: DC | PRN
  Administered 2020-05-04: 20 mg via INTRAVENOUS

## 2020-05-04 MED ORDER — PROPOFOL 500 MG/50ML IV EMUL
INTRAVENOUS | Status: DC | PRN
  Administered 2020-05-04: 150 ug/kg/min via INTRAVENOUS
  Administered 2020-05-04: 100 ug/kg/min via INTRAVENOUS

## 2020-05-04 MED ORDER — LACTATED RINGERS IV SOLN
INTRAVENOUS | Status: DC
  Administered 2020-05-04: 1000 mL via INTRAVENOUS

## 2020-05-04 MED ORDER — METOPROLOL TARTRATE 5 MG/5ML IV SOLN
INTRAVENOUS | Status: DC | PRN
  Administered 2020-05-04: 2 mg via INTRAVENOUS
  Administered 2020-05-04: 1 mg via INTRAVENOUS

## 2020-05-04 MED ORDER — PROPOFOL 10 MG/ML IV BOLUS
INTRAVENOUS | Status: AC
  Filled 2020-05-04: qty 20

## 2020-05-04 SURGICAL SUPPLY — 14 items

## 2020-05-04 NOTE — Anesthesia Procedure Notes (Signed)
Procedure Name: MAC Date/Time: 05/04/2020 11:55 AM Performed by: Claudia Desanctis, CRNA Pre-anesthesia Checklist: Emergency Drugs available, Patient identified, Suction available and Patient being monitored Patient Re-evaluated:Patient Re-evaluated prior to induction Oxygen Delivery Method: Simple face mask

## 2020-05-04 NOTE — Op Note (Signed)
[facility] Patient Name: Kerry Adams Procedure Date: 05/04/2020 MRN: [id number] Attending MD: Gerrit Heck , MD Date of Birth: 1955/05/08 CSN: [id number] Age: 65 Admit Type: Outpatient Procedure:                Upper GI endoscopy Indications:              Acute post hemorrhagic anemia, Hematochezia, Melena Providers:                Gerrit Heck, MD, Particia Nearing, RN, Laverda Sorenson, Technician, Dellie Catholic Referring MD:             Rudell Cobb. Ennever MD, MD Medicines:                Monitored Anesthesia Care Complications:            No immediate complications. Estimated Blood Loss:     Estimated blood loss was minimal. Procedure:                Pre-Anesthesia Assessment:                           - Prior to the procedure, a History and Physical                            was performed, and patient medications and                            allergies were reviewed. The patient's tolerance of                            previous anesthesia was also reviewed. The risks                            and benefits of the procedure and the sedation                            options and risks were discussed with the patient.                            All questions were answered, and informed consent                            was obtained. Prior Anticoagulants: The patient has                            taken Xarelto (rivaroxaban), last dose was 3 days                            prior to procedure. ASA Grade Assessment: IV - A                            patient with severe systemic disease that is a  constant threat to life. After reviewing the risks                            and benefits, the patient was deemed in                            satisfactory condition to undergo the procedure.                           After obtaining informed consent, the endoscope was                            passed under direct  vision. Throughout the                            procedure, the patient's blood pressure, pulse, and                            oxygen saturations were monitored continuously. The                            GIF-H190 ([id number] Olympus gastroscope was                            introduced through the mouth, and advanced to the                            second part of duodenum. The upper GI endoscopy was                            accomplished without difficulty. The patient                            tolerated the procedure well. Scope In: Scope Out: Findings:      Grade I varices were found in the lower third of the esophagus. They       were small in size and no stigmata of recent bleeding.      The Z-line was regular and was found 40 cm from the incisors.      Diffuse mild inflammation characterized by erythema was found in the       gastric fundus and in the gastric body. The mucosa had a snakeskin       appearance, consistent with portal hypertensive gastritis. Biopsies were       taken with a cold forceps for histology. Estimated blood loss was       minimal.      Moderate inflammation characterized by erythema and shallow ulcerations       was found in the gastric antrum. Biopsies were taken with a cold forceps       for histology. Estimated blood loss was minimal.      The examined duodenum was normal. Impression:               - Grade I esophageal varices.                           -  Z-line regular, 40 cm from the incisors.                           - Portal Hypertensive Gastritis. Biopsied.                           - Gastritis with superficial ulcers noted in the                            antrum. Biopsied.                           - Normal examined duodenum.                           - No blood noted in the GI lumen. No endoscopic                            intervention was indicated based on the above                            findings. Moderate Sedation:      Not  Applicable - Patient had care per Anesthesia. Recommendation:           - Patient has a contact number available for                            emergencies. The signs and symptoms of potential                            delayed complications were discussed with the                            patient. Return to normal activities tomorrow.                            Written discharge instructions were provided to the                            patient.                           - Resume previous diet.                           - Continue present medications.                           - Await pathology results.                           - Resume Protonix (pantoprazole) 40 mg PO BID.                           - Ok to resume Xarelto per Oncology and Cardiology  recommendations.                           - Return to GI clinic in 2-4 weeks, or sooner as                            needed.                           - If recurrent bleeding, recommend returning to the                            ER for expedited evaluation, to include either                            tagged RBC scan or CT Angiography to try to                            localize source of bleeding.                           - Repeat CBC check per plan with Oncologist. Procedure Code(s):        --- Professional ---                           [phone], Esophagogastroduodenoscopy, flexible,                            transoral; with biopsy, single or multiple Diagnosis Code(s):        --- Professional ---                           I85.00, Esophageal varices without bleeding                           K29.70, Gastritis, unspecified, without bleeding                           D62, Acute posthemorrhagic anemia                           K92.1, Melena (includes Hematochezia) CPT copyright 2019 American Medical Association. All rights reserved. The codes documented in this report are preliminary and upon coder review  may  be revised to meet current compliance requirements. Gerrit Heck, MD 05/04/2020 12:16:36 PM Number of Addenda: 0

## 2020-05-04 NOTE — Transfer of Care (Signed)
Immediate Anesthesia Transfer of Care Note  Patient: Kerry Adams  Procedure(s) Performed: ESOPHAGOGASTRODUODENOSCOPY (EGD) WITH PROPOFOL (N/A ) BIOPSY  Patient Location: Endoscopy Unit  Anesthesia Type:MAC  Level of Consciousness: awake, alert , oriented and patient cooperative  Airway & Oxygen Therapy: Patient Spontanous Breathing and Patient connected to face mask  Post-op Assessment: Report given to RN and Post -op Vital signs reviewed and stable  Post vital signs: Reviewed and stable  Last Vitals:  Vitals Value Taken Time  BP    Temp    Pulse    Resp    SpO2      Last Pain:  Vitals:   05/04/20 1020  TempSrc: Oral  PainSc: 0-No pain         Complications: No complications documented.

## 2020-05-04 NOTE — Interval H&P Note (Signed)
History and Physical Interval Note:  05/04/2020 10:52 AM  Kerry Adams  has presented today for surgery, with the diagnosis of anemia, hepatocellular carcinoma.  The various methods of treatment have been discussed with the patient and family. After consideration of risks, benefits and other options for treatment, the patient has consented to  Procedure(s): ESOPHAGOGASTRODUODENOSCOPY (EGD) WITH PROPOFOL (N/A) as a surgical intervention.  The patient's history has been reviewed, patient examined, no change in status, stable for surgery.  I have reviewed the patient's chart and labs.  Questions were answered to the patient's satisfaction.     Dominic Pea [name]

## 2020-05-04 NOTE — Discharge Instructions (Signed)
YOU HAD AN ENDOSCOPIC PROCEDURE TODAY: Refer to the procedure report and other information in the discharge instructions given to you for any specific questions about what was found during the examination. If this information does not answer your questions, please call [place] office at [phone] to clarify.  ° °YOU SHOULD EXPECT: Some feelings of bloating in the abdomen. Passage of more gas than usual. Walking can help get rid of the air that was put into your GI tract during the procedure and reduce the bloating. If you had a lower endoscopy (such as a colonoscopy or flexible sigmoidoscopy) you may notice spotting of blood in your stool or on the toilet paper. Some abdominal soreness may be present for a day or two, also. ° °DIET: Your first meal following the procedure should be a light meal and then it is ok to progress to your normal diet. A half-sandwich or bowl of soup is an example of a good first meal. Heavy or fried foods are harder to digest and may make you feel nauseous or bloated. Drink plenty of fluids but you should avoid alcoholic beverages for 24 hours. If you had a esophageal dilation, please see attached instructions for diet.   ° °ACTIVITY: Your care partner should take you home directly after the procedure. You should plan to take it easy, moving slowly for the rest of the day. You can resume normal activity the day after the procedure however YOU SHOULD NOT DRIVE, use power tools, machinery or perform tasks that involve climbing or major physical exertion for 24 hours (because of the sedation medicines used during the test).  ° °SYMPTOMS TO REPORT IMMEDIATELY: °A gastroenterologist can be reached at any hour. Please call [phone]  for any of the following symptoms:  °Following lower endoscopy (colonoscopy, flexible sigmoidoscopy) °Excessive amounts of blood in the stool  °Significant tenderness, worsening of abdominal pains  °Swelling of the abdomen that is new, acute  °Fever of 100° or  higher  °Following upper endoscopy (EGD, EUS, ERCP, esophageal dilation) °Vomiting of blood or coffee ground material  °New, significant abdominal pain  °New, significant chest pain or pain under the shoulder blades  °Painful or persistently difficult swallowing  °New shortness of breath  °Black, tarry-looking or red, bloody stools ° °FOLLOW UP:  °If any biopsies were taken you will be contacted by phone or by letter within the next 1-3 weeks. Call [phone]  if you have not heard about the biopsies in 3 weeks.  °Please also call with any specific questions about appointments or follow up tests. ° °

## 2020-05-04 NOTE — Anesthesia Procedure Notes (Signed)
Performed by: Claudia Desanctis, CRNA

## 2020-05-04 NOTE — Anesthesia Postprocedure Evaluation (Signed)
Anesthesia Post Note  Patient: Kerry Adams  Procedure(s) Performed: ESOPHAGOGASTRODUODENOSCOPY (EGD) WITH PROPOFOL (N/A ) BIOPSY     Patient location during evaluation: PACU Anesthesia Type: MAC Level of consciousness: awake and alert Pain management: pain level controlled Vital Signs Assessment: post-procedure vital signs reviewed and stable Respiratory status: spontaneous breathing, nonlabored ventilation, respiratory function stable and patient connected to nasal cannula oxygen Cardiovascular status: stable, blood pressure returned to baseline and tachycardic Anesthetic complications: no   No complications documented.  Last Vitals:  Vitals:   05/04/20 1229 05/04/20 1230  BP:  110/70  Pulse: (!) 130 (!) 131  Resp: (!) 33 (!) 31  Temp:    SpO2: 92% 90%    Last Pain:  Vitals:   05/04/20 1230  TempSrc:   PainSc: 0-No pain                 Audry Pili

## 2020-05-04 NOTE — Progress Notes (Signed)
Notified Dr Antonieta Pert desk nurse of posted results.   Oncology Nurse Navigator Documentation  Oncology Nurse Navigator Flowsheets 05/04/2020  Abnormal Finding Date -  Confirmed Diagnosis Date -  Diagnosis Status -  Planned Course of Treatment -  Phase of Treatment -  Chemotherapy Actual Start Date: -  Chemo/Radiation Concurrent Pending- Reason: -  Radiation Actual Start Date: -  Navigator Follow Up Date: 05/09/2020  Navigator Follow Up Reason: Radiation  Navigator Location [facility]  Referral Date to RadOnc/MedOnc -  Navigator Encounter Type Appt/Treatment Plan Review;Other:  Telephone -  Treatment Initiated Date -  Patient Visit Type MedOnc  Treatment Phase Active Tx  Barriers/Navigation Needs Anxiety;Mobility Issues;Morbidities/Frailty;Pain  Education -  Interventions None Required  Acuity Level 2-Minimal Needs (1-2 Barriers Identified)  Referrals -  Coordination of Care -  Education Method -  Support Groups/Services Friends and Family  Time Spent with Patient 61

## 2020-05-04 NOTE — Anesthesia Preprocedure Evaluation (Addendum)
Anesthesia Evaluation  Patient identified by MRN, date of birth, ID band Patient awake    Reviewed: Allergy & Precautions, NPO status , Patient's Chart, lab work & pertinent test results, reviewed documented beta blocker date and time   History of Anesthesia Complications Negative for: history of anesthetic complications  Airway Mallampati: II  TM Distance: >3 FB Neck ROM: Full    Dental  (+) Dental Advisory Given   Pulmonary neg pulmonary ROS,     + decreased breath sounds      Cardiovascular hypertension, Pt. on home beta blockers and Pt. on medications + CAD  + dysrhythmias Atrial Fibrillation  Rhythm:Irregular Rate:Tachycardia     Neuro/Psych negative neurological ROS  negative psych ROS   GI/Hepatic GERD  Medicated and Controlled,(+) Cirrhosis     substance abuse  alcohol use,  HCC with mets to lung, adrenal gland     Endo/Other  negative endocrine ROS  Renal/GU negative Renal ROS     Musculoskeletal  (+) Arthritis ,   Abdominal   Peds  Hematology  (+) anemia ,  Plt 143k    Anesthesia Other Findings Covid test negative   Reproductive/Obstetrics                            Anesthesia Physical Anesthesia Plan  ASA: IV  Anesthesia Plan: MAC   Post-op Pain Management:    Induction: Intravenous  PONV Risk Score and Plan: 1 and Propofol infusion and Treatment may vary due to age or medical condition  Airway Management Planned: Nasal Cannula and Natural Airway  Additional Equipment: None  Intra-op Plan:   Post-operative Plan:   Informed Consent: I have reviewed the patients History and Physical, chart, labs and discussed the procedure including the risks, benefits and alternatives for the proposed anesthesia with the patient or authorized representative who has indicated his/her understanding and acceptance.       Plan Discussed with: CRNA and  Anesthesiologist  Anesthesia Plan Comments:        Anesthesia Quick Evaluation

## 2020-05-07 ENCOUNTER — Encounter (HOSPITAL_COMMUNITY): Payer: Self-pay | Admitting: Gastroenterology

## 2020-05-07 LAB — SURGICAL PATHOLOGY

## 2020-05-08 ENCOUNTER — Other Ambulatory Visit: Payer: Self-pay | Admitting: Hematology & Oncology

## 2020-05-09 ENCOUNTER — Ambulatory Visit
Admission: RE | Admit: 2020-05-09 | Discharge: 2020-05-09 | Disposition: A | Discharge status: Home / Self Care | Payer: Medicare Other | Source: Ambulatory Visit | Attending: Radiation Oncology | Admitting: Radiation Oncology

## 2020-05-09 ENCOUNTER — Other Ambulatory Visit: Payer: Self-pay

## 2020-05-09 ENCOUNTER — Encounter: Payer: Self-pay | Admitting: Radiation Oncology

## 2020-05-09 VITALS — BP 108/73 | HR 110 | Temp 97.0°F | Resp 20 | Ht 73.0 in | Wt 218.1 lb

## 2020-05-09 DIAGNOSIS — D500 Iron deficiency anemia secondary to blood loss (chronic): Secondary | ICD-10-CM | POA: Diagnosis not present

## 2020-05-09 DIAGNOSIS — M47814 Spondylosis without myelopathy or radiculopathy, thoracic region: Secondary | ICD-10-CM | POA: Diagnosis not present

## 2020-05-09 DIAGNOSIS — C7801 Secondary malignant neoplasm of right lung: Secondary | ICD-10-CM | POA: Diagnosis not present

## 2020-05-09 DIAGNOSIS — I4891 Unspecified atrial fibrillation: Secondary | ICD-10-CM | POA: Insufficient documentation

## 2020-05-09 DIAGNOSIS — I2510 Atherosclerotic heart disease of native coronary artery without angina pectoris: Secondary | ICD-10-CM | POA: Diagnosis not present

## 2020-05-09 DIAGNOSIS — Z79899 Other long term (current) drug therapy: Secondary | ICD-10-CM | POA: Insufficient documentation

## 2020-05-09 DIAGNOSIS — K31A11 Gastric intestinal metaplasia without dysplasia, involving the antrum: Secondary | ICD-10-CM | POA: Insufficient documentation

## 2020-05-09 DIAGNOSIS — C221 Intrahepatic bile duct carcinoma: Secondary | ICD-10-CM | POA: Insufficient documentation

## 2020-05-09 DIAGNOSIS — I119 Hypertensive heart disease without heart failure: Secondary | ICD-10-CM | POA: Diagnosis not present

## 2020-05-09 DIAGNOSIS — C220 Liver cell carcinoma: Secondary | ICD-10-CM

## 2020-05-09 DIAGNOSIS — E785 Hyperlipidemia, unspecified: Secondary | ICD-10-CM | POA: Diagnosis not present

## 2020-05-09 DIAGNOSIS — K219 Gastro-esophageal reflux disease without esophagitis: Secondary | ICD-10-CM | POA: Insufficient documentation

## 2020-05-09 DIAGNOSIS — K7460 Unspecified cirrhosis of liver: Secondary | ICD-10-CM | POA: Insufficient documentation

## 2020-05-09 DIAGNOSIS — I10 Essential (primary) hypertension: Secondary | ICD-10-CM | POA: Insufficient documentation

## 2020-05-09 DIAGNOSIS — C7951 Secondary malignant neoplasm of bone: Secondary | ICD-10-CM

## 2020-05-09 DIAGNOSIS — Z7901 Long term (current) use of anticoagulants: Secondary | ICD-10-CM | POA: Insufficient documentation

## 2020-05-09 DIAGNOSIS — M1711 Unilateral primary osteoarthritis, right knee: Secondary | ICD-10-CM | POA: Diagnosis not present

## 2020-05-09 DIAGNOSIS — K8020 Calculus of gallbladder without cholecystitis without obstruction: Secondary | ICD-10-CM | POA: Diagnosis not present

## 2020-05-09 NOTE — Progress Notes (Signed)
Radiation Oncology         [phone] ________________________________  Re-Consultation Note  Name: Kerry Adams MRN: [id number]  Date: 05/09/2020  DOB: [birth year]  CC:Vicenta Aly, FNP  Marin Olp Rudell Cobb, MD   REFERRING PHYSICIAN: Volanda Napoleon, MD  DIAGNOSIS: The encounter diagnosis was Hepatocellular carcinoma metastatic to bone [facility]).  Metastatic hepatocellular carcinoma  HISTORY OF PRESENT ILLNESS::Kerry Adams is a 65 y.o. male who is well known to me, as I treated him for metastatic hepatocellular carcinoma in mid and late 2021. He was last seen in follow-up on 01/16/2020, at which time he was noted to have received good palliative benefit from his radiation therapy in terms of right shoulder mobility and pain. He was to continue close follow-up with Dr. Marin Olp and follow-up with radiation oncology on an as-needed basis.  Since his last visit, he underwent a chest CT scan on 02/15/2020 that showed slight interval enlargement of an expansile soft tissue mass centered around the anterior right first rib that measured 9.4 x 7.3 cm, previously 8.7 x 7.1 cm. Findings were consistent with worsened metastatic disease. There was also noted to be enlargement of a pretracheal lymph node, concerning for worsened nodal metastatic disease. Additionally, there were hypodense liver lesions in general that were poorly evaluated but appeared unchanged. The mild thickening of the right adrenal gland was unchanged without distinct nodularity, previously the site of a large metastatic nodule. Constellation of findings generally suggested a mixed response to treatment.   Treatment with Tecentriq/Avastin was discontinued on 02/20/2020 secondary to progressive disease.  CT scan of chest on 04/26/2020 showed a 75% increase in volume of the right upper lobe lung mass invading the right upper chest wall since 02/15/2020 with associated destruction of the right first rib and early involvement of  the right second rib. Patency of the right subclavian vein was very questionable given the extrinsic compression. Mild subcarinal adenopathy was stable. Right pleural effusion was stable with atelectasis in the right lung had increased. The hypodense hepatic lesions representing residua from prior multifocal hepatocellular carcinoma were stable. No current arterial phase enhancing lesion was identified.  The patient was last seen by Dr. Marin Olp on 05/01/2020. At that time, Nivolumab/Yerboy was discontinued. However, the patient remains on Xgeva.  Of note, the patient underwent an upper endoscopy on 05/04/2020 under the care of Dr. Bryan Lemma. Pathology from the procedure revealed gastric antral mucosa with evidence of healing erosion/ulcer, intestinal metaplasia without dysplasia, and gastric oxyntic mucosa with mild chronic gastritis. Warthin-Starry stain was negative for H. Pylori.  Patient is having worsening pain in the right shoulder is now seen in radiation oncology to see if he would be a candidate for additional radiation treatments to this area.  PAST MEDICAL HISTORY:  Past Medical History:  Diagnosis Date  . Abnormal EKG   . Alcohol use 02/18/2014  . Arthritis    osteo knees and hips   . Atrial fibrillation ([place])   . CAD (coronary artery disease)    WITHOUT ANGINA PECTORIS, UNSPECIFIED VESSEL OR LESION TYPE, UNSPECIFIED WHETHER NATIVE OR TRANSPLANTED HEART  . 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 Chronic liver disease   . Cirrhosis ([place])   . Closed right hip fracture ([place]) 02/18/2014  . Dizziness   . Dysrhythmia   . Elevated liver function tests   . GERD (gastroesophageal reflux disease)    treats with OTC Ranitidine  . Goals of care, counseling/discussion 06/20/2019  . Hepatocellular carcinoma metastatic to lung ([place])  07/08/2019  . 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 Hip fracture requiring operative repair ([place])  02/19/2014  . Hyperlipidemia   . Hypertension   . Iron deficiency anemia due to chronic blood loss 02/20/2020  . Metastasis to right adrenal gland of unknown origin ([place]) 06/20/2019  . 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 Osteoarthritis of knee, unilateral 03/01/2015  . Primary osteoarthritis of right knee 05/23/2015  . Thrombocytopenia ([place])     PAST SURGICAL HISTORY: Past Surgical History:  Procedure Laterality Date  . BIOPSY  05/04/2020   Procedure: BIOPSY;  Surgeon: Lavena Bullion, DO;  Location: [facility] ENDOSCOPY;  Service: Gastroenterology;;  . CARDIOVERSION N/A 09/13/2018   Procedure: CARDIOVERSION;  Surgeon: Thayer Headings, MD;  Location: [facility] ENDOSCOPY;  Service: Cardiovascular;  Laterality: N/A;  . ESOPHAGOGASTRODUODENOSCOPY (EGD) WITH PROPOFOL N/A 05/04/2020   Procedure: ESOPHAGOGASTRODUODENOSCOPY (EGD) WITH PROPOFOL;  Surgeon: Lavena Bullion, DO;  Location: [facility] ENDOSCOPY;  Service: Gastroenterology;  Laterality: N/A;  . FRACTURE SURGERY     right femur  . HERNIA REPAIR     umbilical  . INTERTROCHANTERIC HIP FRACTURE SURGERY Right 02/19/2014  . INTRAMEDULLARY (IM) NAIL INTERTROCHANTERIC Right 02/19/2014   Procedure: INTRAMEDULLARY (IM) NAIL INTERTROCHANTRIC;  Surgeon: [name], MD;  Location: [place];  Service: Orthopedics;  Laterality: Right;  . SHOULDER SURGERY Right   . TOTAL KNEE ARTHROPLASTY Left 03/01/2015   Procedure: TOTAL KNEE ARTHROPLASTY;  Surgeon: [name], MD;  Location: [place];  Service: Orthopedics;  Laterality: Left;  Left knee arthroplasty  . TOTAL KNEE ARTHROPLASTY Right 05/23/2015   Procedure: TOTAL KNEE ARTHROPLASTY W/HARDWARE REMOVAL FEMUR;  Surgeon: Frederik Pear, MD;  Location: [place];  Service: Orthopedics;  Laterality: Right;    FAMILY HISTORY:  Family History  Problem Relation Age of Onset  . Hypertension Mother   . Stroke Father   . Healthy Brother   . Colon cancer Neg  Hx   . Colon polyps Neg Hx   . Esophageal cancer Neg Hx     SOCIAL HISTORY:  Social History   Tobacco Use  . Smoking status: Never Smoker  . Smokeless tobacco: Never Used  Vaping Use  . Vaping Use: Never used  Substance Use Topics  . Alcohol use: Not Currently    Alcohol/week: 32.0 standard drinks    Types: 32 Cans of beer per week    Comment: Zero since 08/25/19.  . Drug use: No    ALLERGIES: No Known Allergies  MEDICATIONS:  Current Outpatient Medications  Medication Sig Dispense Refill  . acetaminophen (TYLENOL) 500 MG tablet Take 1,000 mg by mouth every 8 (eight) hours as needed for moderate pain.    Marland Kitchen amoxicillin (AMOXIL) 500 MG capsule Take 2,000 mg by mouth See admin instructions. Take 2000 mg 1 hour prior to dental work    . furosemide (LASIX) 40 MG tablet Take 1 tablet (40 mg total) by mouth daily. Take 1 hour AFTER metolazone 30 tablet 3  . metolazone (ZAROXOLYN) 5 MG tablet Take 1 tablet (5 mg total) by mouth daily. Take 1 hour BEFORE furosemide. 30 tablet 4  . metoprolol tartrate (LOPRESSOR) 100 MG tablet Take 1 tablet (100 mg total) by mouth 2 (two) times daily. 180 tablet 3  . Multiple Vitamin (MULTIVITAMIN WITH MINERALS) TABS tablet Take 1 tablet by mouth daily.    . ondansetron (ZOFRAN) 8 MG tablet Take 8 mg by mouth  every 8 (eight) hours as needed for vomiting or nausea.    . pantoprazole (PROTONIX) 40 MG tablet Take 1 tablet (40 mg total) by mouth 2 (two) times daily. 60 tablet 3  . polyethylene glycol (MIRALAX / GLYCOLAX) 17 g packet Take 17 g by mouth daily as needed.    . Potassium 99 MG TABS Take 99 mg by mouth daily.    . prochlorperazine (COMPAZINE) 10 MG tablet Take 10 mg by mouth every 6 (six) hours as needed for nausea or vomiting.    . Pseudoeph-Doxylamine-DM-APAP (NYQUIL PO) Take 1 capsule by mouth at bedtime.    . rivaroxaban (XARELTO) 10 MG TABS tablet Take 1 tablet (10 mg total) by mouth daily. 30 tablet 3  . silver sulfADIAZINE (SILVADENE) 1 %  cream Apply 1 application topically daily. (Patient taking differently: Apply 1 application topically 3 (three) times daily.) 400 g 1  . thiamine (VITAMIN B-1) 100 MG tablet Take 100 mg by mouth daily.    . traMADol (ULTRAM) 50 MG tablet TAKE 1 TABLET BY MOUTH EVERY 6 HOURS AS NEEDED (Patient taking differently: Take 50 mg by mouth every 6 (six) hours as needed for moderate pain.) 60 tablet 0  . trolamine salicylate (ASPERCREME) 10 % cream Apply 1 application topically as needed for muscle pain.    . vitamin B-12 (CYANOCOBALAMIN) 500 MCG tablet Take 500 mcg by mouth daily.    . famotidine (PEPCID) 10 MG tablet Take 10 mg by mouth every morning.  (Patient not taking: Reported on 05/09/2020)     No current facility-administered medications for this encounter.    REVIEW OF SYSTEMS:  A 10+ POINT REVIEW OF SYSTEMS WAS OBTAINED including neurology, dermatology, psychiatry, cardiac, respiratory, lymph, extremities, GI, GU, musculoskeletal, constitutional, reproductive, HEENT.    PHYSICAL EXAM:  height is 6\' 1"  (1.854 m) and weight is 218 lb 2 oz (98.9 kg). His temporal temperature is 97 F (36.1 C) (abnormal). His blood pressure is 108/73 and his pulse is 110 (abnormal). His respiration is 20 and oxygen saturation is 92%.   General: Alert and oriented, in no acute distress, remains in a wheelchair for further evaluation HEENT: Head is normocephalic. Extraocular movements are intact. Oropharynx is clear. Neck: Neck is supple, no palpable cervical or supraclavicular lymphadenopathy. Heart: Regular in rate and rhythm with no murmurs, rubs, or gallops. Chest: Clear to auscultation bilaterally, with no rhonchi, wheezes, or rales. Abdomen: Soft, nontender, nondistended, with no rigidity or guarding. Extremities: No cyanosis or edema. Lymphatics: see Neck Exam Skin: No concerning lesions. Musculoskeletal: symmetric strength and muscle tone throughout.  Limited abduction of the right arm and  shoulder Neurologic: Cranial nerves II through XII are grossly intact. No obvious focalities. Speech is fluent. Coordination is intact. Psychiatric: Judgment and insight are intact. Affect is appropriate.   ECOG = 2  0 - Asymptomatic (Fully active, able to carry on all predisease activities without restriction)  1 - Symptomatic but completely ambulatory (Restricted in physically strenuous activity but ambulatory and able to carry out work of a light or sedentary nature. For example, light housework, office work)  2 - Symptomatic, <50% in bed during the day (Ambulatory and capable of all self care but unable to carry out any work activities. Up and about more than 50% of waking hours)  3 - Symptomatic, >50% in bed, but not bedbound (Capable of only limited self-care, confined to bed or chair 50% or more of waking hours)  4 - Bedbound (Completely disabled. Cannot carry  on any self-care. Totally confined to bed or chair)  5 - Death   Eustace Pen MM, Creech RH, Tormey DC, et al. [phone]). "Toxicity and response criteria of the [facility] Group". [place] Oncol. 5 (6): 649-55  LABORATORY DATA:  Lab Results  Component Value Date   WBC 6.4 05/01/2020   HGB 8.7 (L) 05/01/2020   HCT 29.2 (L) 05/01/2020   MCV 113.6 (H) 05/01/2020   PLT 143 (L) 05/01/2020   NEUTROABS 4.9 05/01/2020   Lab Results  Component Value Date   NA 139 05/01/2020   K 4.4 05/01/2020   CL 106 05/01/2020   CO2 28 05/01/2020   GLUCOSE 178 (H) 05/01/2020   CREATININE 0.71 05/01/2020   CALCIUM 8.4 (L) 05/01/2020      RADIOGRAPHY: CT CHEST ABDOMEN PELVIS W CONTRAST  Result Date: 04/27/2020 CLINICAL DATA:  Metastatic hepatocellular carcinoma with ongoing chemotherapy. Prior radiation therapy to the lung. EXAM: CT CHEST, ABDOMEN, AND PELVIS WITH CONTRAST TECHNIQUE: Multidetector CT imaging of the chest, abdomen and pelvis was performed following the standard protocol during bolus administration of  intravenous contrast. CONTRAST:  159mL OMNIPAQUE IOHEXOL 300 MG/ML  SOLN COMPARISON:  Multiple exams, including 02/15/2020 FINDINGS: CT CHEST FINDINGS Cardiovascular: Right upper lobe lung mass invades the chest wall with associated destruction of the right first rib and part of the right second rib, and extending up slightly into the lower neck as well as along the right axilla. The mass demonstrates central necrosis and measures 10.9 by 8.4 by 12.2 cm (volume = 580 cm^3) on image 22 series 7, formerly 9.4 by 7.3 by 9.2 cm (volume = 330 cm^3). This abuts and displaces the subclavian and axillary vessels and is adjacent to the SVC although without SVC narrowing at this time. I am uncertain about patency of the right subclavian vein. Coronary, aortic arch, and branch vessel atherosclerotic vascular disease. Moderate cardiomegaly. Mitral valve calcification. Mediastinum/Nodes: Lower paratracheal node at the level of the carina 1.1 cm in short axis on image 35 series 7, stable. Small amount of fluid adjacent to the distal esophagus, possibly having extended up through the hiatus. Lungs/Pleura: Right upper lobe mass invading the right upper chest wall as detailed in the cardiovascular section above. There is associated passive atelectasis as well as increasing atelectasis in the right middle lobe and right lower lobe. Elevated right hemidiaphragm. Small right pleural effusion. Musculoskeletal: Bony destruction of the right first rib due to the right apical mass. There is some sclerosis in the right second rib along its margin with the mass potentially reflecting early bony involvement of the second rib for example on image 67 series 10. The mass abuts the right clavicle without visible clavicular erosion. Lower thoracic spondylosis. CT ABDOMEN PELVIS FINDINGS Hepatobiliary: Cirrhosis. Stable hypodense 1.2 cm hypodense lesion in the dome of the right hepatic lobe on image 73 series 7. Another hypodense lesion measures  1.0 cm in diameter on image 95 of series 7, which is stable. Other smaller hypodense lesions are present in the liver and appear stable. These correspond to lesions which were previously larger and enhancing on 06/20/2019, and accordingly likely represent treated hepatic metastatic disease. These lesions are hypodense on both portal venous and arterial phase images. I not see active arterial phase enhancing lesions to suggest active or progressive hepatocellular carcinoma. Small gallstones are present in the gallbladder. No biliary dilatation. Pancreas: Unremarkable Spleen: Unremarkable Adrenals/Urinary Tract: Unremarkable Stomach/Bowel: Unremarkable Vascular/Lymphatic: Aortoiliac atherosclerotic vascular disease. Reproductive: Dystrophic calcifications in the  prostate gland. Other: Increased ascites. Mild mesenteric edema. No definite nodularity or enhancement along the ascites. Musculoskeletal: Stable remote compression fracture at L2. Lumbar spondylosis and degenerative disc disease causing multilevel mild impingement. Right hip ORIF traversing known pathologic fracture site with appearance unchanged from prior. IMPRESSION: 1. 75% increase in volume of the right upper lobe lung mass invading the right upper chest wall since 02/15/2020, with associated destruction of the right first rib and early involvement of the right second rib. Patency of the right subclavian vein is very questionable given the extrinsic compression. 2. Stable mild subcarinal adenopathy. 3. Stable small right pleural effusion. Increased atelectasis in the right lung. 4. Stable appearance of the hypodense hepatic lesions representing residua from prior multifocal hepatocellular carcinoma. No current arterial phase enhancing lesion is identified. 5. Other imaging findings of potential clinical significance: Coronary atherosclerosis. Moderate cardiomegaly. Mitral valve calcification. Cirrhosis. Increased ascites. Cholelithiasis. Mild mesenteric  edema. Right hip ORIF traversing known pathologic fracture site with appearance unchanged from prior. Stable remote compression fracture at L2. Lumbar spondylosis and degenerative disc disease causing multilevel mild impingement. 6. Aortic atherosclerosis. Aortic Atherosclerosis (ICD10-I70.0). Electronically Signed   By: Van Clines M.D.   On: 04/27/2020 13:56      IMPRESSION: Metastatic hepatocellular carcinoma  The patient is having worsening pain in his right shoulder which is his major concern at this time.  Even though the patient did not have significant shrinkage with his previous radiation therapy to this area he did have significant improvement in his pain and shoulder mobility.  Given these findings the patient will be a candidate for additional short course of radiation therapy.  PLAN: Patient will be scheduled for CT simulation in the near future with treatments to begin soon afterward.  Anticipate 8 radiation treatments to the large mass in the right upper chest and shoulder region.  Total time spent in this encounter was 35 minutes which included reviewing the patient's most recent follow-ups, CT scans, treatment, colonoscopy, pathology report, physical examination, and documentation.  ------------------------------------------------  Blair Promise, PhD, MD  This document serves as a record of services personally performed by Gery Pray, MD. It was created on his behalf by Clerance Lav, a trained medical scribe. The creation of this record is based on the scribe's personal observations and the provider's statements to them. This document has been checked and approved by the attending provider.

## 2020-05-10 ENCOUNTER — Encounter: Payer: Self-pay | Admitting: *Deleted

## 2020-05-10 ENCOUNTER — Ambulatory Visit: Payer: Medicare Other | Admitting: Radiation Oncology

## 2020-05-10 ENCOUNTER — Telehealth: Payer: Self-pay | Admitting: *Deleted

## 2020-05-15 ENCOUNTER — Ambulatory Visit: Payer: Medicare Other | Admitting: Radiation Oncology

## 2020-05-16 ENCOUNTER — Telehealth: Payer: Self-pay | Admitting: Internal Medicine

## 2020-05-16 ENCOUNTER — Ambulatory Visit: Payer: Medicare Other

## 2020-05-16 NOTE — Telephone Encounter (Signed)
New Message:     Wife called and said pt passed away on 3-17--22. She wanted each one of you to know how much she appreciated all that you did for [facility]. She said she could never thank you enough. Wishing each of you the best.

## 2020-05-17 ENCOUNTER — Ambulatory Visit: Payer: Medicare Other

## 2020-05-18 ENCOUNTER — Ambulatory Visit: Payer: Medicare Other

## 2020-05-21 ENCOUNTER — Ambulatory Visit: Payer: Medicare Other | Admitting: Hematology & Oncology

## 2020-05-21 ENCOUNTER — Ambulatory Visit: Payer: Medicare Other

## 2020-05-21 ENCOUNTER — Other Ambulatory Visit: Payer: Medicare Other

## 2020-05-22 ENCOUNTER — Ambulatory Visit: Payer: Medicare Other

## 2020-05-23 ENCOUNTER — Ambulatory Visit: Payer: Medicare Other

## 2020-05-24 ENCOUNTER — Ambulatory Visit: Payer: Medicare Other

## 2020-05-25 ENCOUNTER — Ambulatory Visit: Payer: Medicare Other | Admitting: Gastroenterology

## 2020-05-25 NOTE — Progress Notes (Signed)
Office received word that patient has passed away at home. Dr Clabe Seal office notified of patient passing. Appointments cancelled.  Oncology Nurse Navigator Documentation  Oncology Nurse Navigator Flowsheets 2020-06-05  Abnormal Finding Date -  Confirmed Diagnosis Date -  Diagnosis Status -  Planned Course of Treatment -  Phase of Treatment -  Chemotherapy Actual Start Date: -  Chemo/Radiation Concurrent Pending- Reason: -  Radiation Actual Start Date: -  Navigator Follow Up Date: -  Navigator Follow Up Reason: -  Navigation Complete Date: 05-Jun-2020  Post Navigation: Continue to Follow Patient? No  Reason Not Navigating Patient: Hospice/Death  Navigator Location -  Referral Date to RadOnc/MedOnc -  Navigator Encounter Type -  Telephone -  Treatment Initiated Date -  Patient Visit Type -  Treatment Phase -  Barriers/Navigation Needs -  Education -  Interventions -  Acuity -  Referrals -  Coordination of Care -  Education Method -  Support Groups/Services -  Time Spent with Patient 30

## 2020-05-25 NOTE — Telephone Encounter (Signed)
Call received from [facility] with [facility] to inform Dr. Marin Olp that they were called to patient's house this AM and pt passed away within ten minutes of their arrival. Jory Ee NP notified.

## 2020-05-25 NOTE — Progress Notes (Signed)
Oncology Nurse Navigator Documentation  Oncology Nurse Navigator Flowsheets May 25, 2020  Abnormal Finding Date -  Confirmed Diagnosis Date -  Diagnosis Status -  Planned Course of Treatment Radiation  Phase of Treatment Radiation  Chemotherapy Actual Start Date: -  Chemo/Radiation Concurrent Pending- Reason: -  Radiation Actual Start Date: -  Navigator Follow Up Date: 05/15/2020  Navigator Follow Up Reason: Radiation  Navigator Location [facility]  Referral Date to RadOnc/MedOnc -  Navigator Encounter Type Appt/Treatment Plan Review  Telephone -  Treatment Initiated Date -  Patient Visit Type MedOnc  Treatment Phase Active Tx  Barriers/Navigation Needs Anxiety;Mobility Issues;Morbidities/Frailty;Pain  Education -  Interventions None Required  Acuity Level 2-Minimal Needs (1-2 Barriers Identified)  Referrals -  Coordination of Care -  Education Method -  Support Groups/Services Friends and Family  Time Spent with Patient 15

## 2020-05-25 DEATH — deceased

## 2020-06-04 ENCOUNTER — Encounter (HOSPITAL_BASED_OUTPATIENT_CLINIC_OR_DEPARTMENT_OTHER): Payer: Medicare Other | Admitting: Internal Medicine

## 2021-06-21 IMAGING — CT CT BIOPSY
1 of 3 series · 14 of 32 positions shown, 19 images · non-contrast
Comparison: none

INDICATION: 63-year-old with large mass involving the right hip. Tissue
diagnosis is needed.

[Series 2: i-spiral 5.0 b31f · axial · 0.98mm/px · z∈[-378,-238]mm · 14 of 46 slices shown, 19 images]
[im 3/46  soft-tissue]
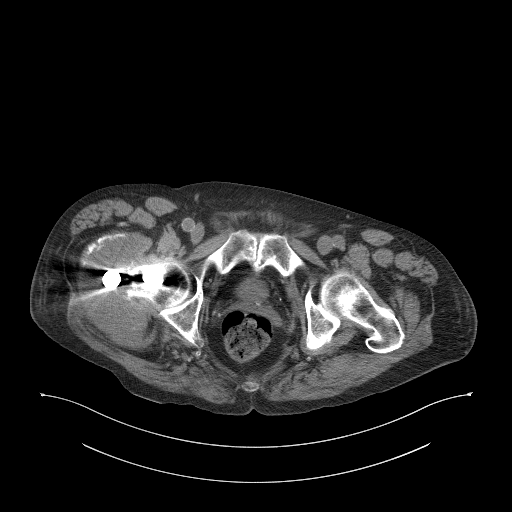
[im 3/46  bone]
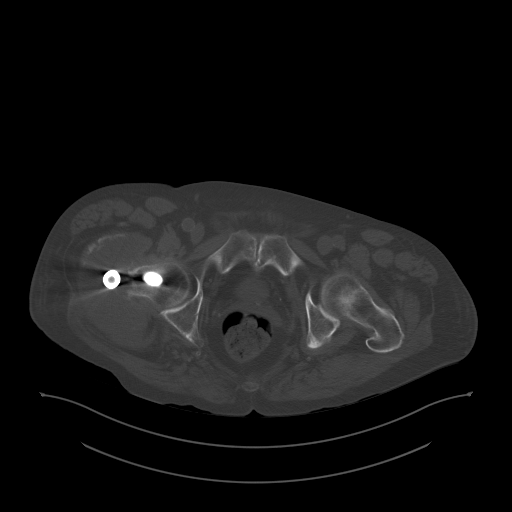
[im 6/46  soft-tissue]
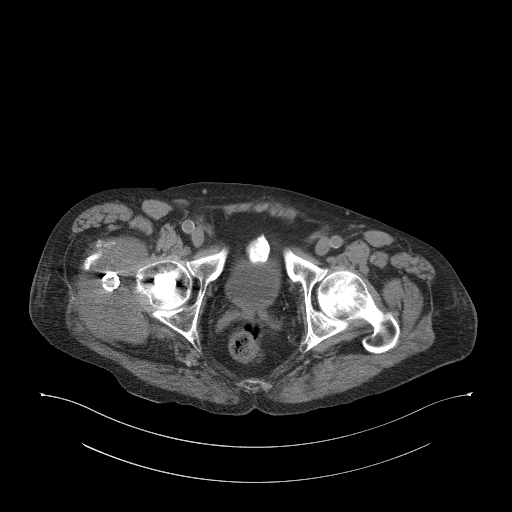
[im 11/46  soft-tissue]
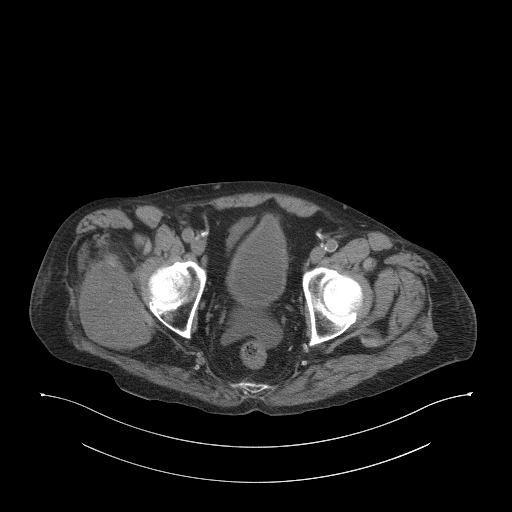
[im 13/46  soft-tissue]
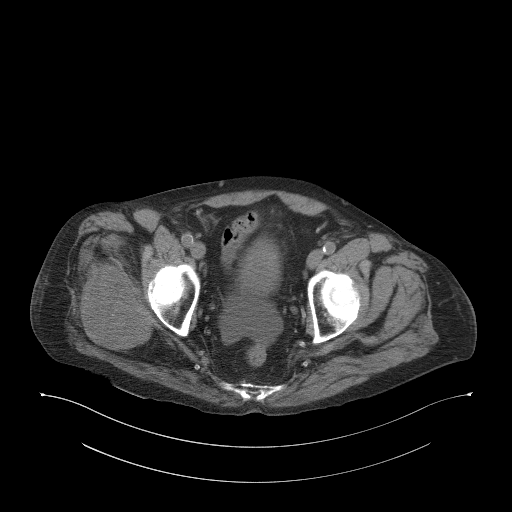
[im 16/46  soft-tissue]
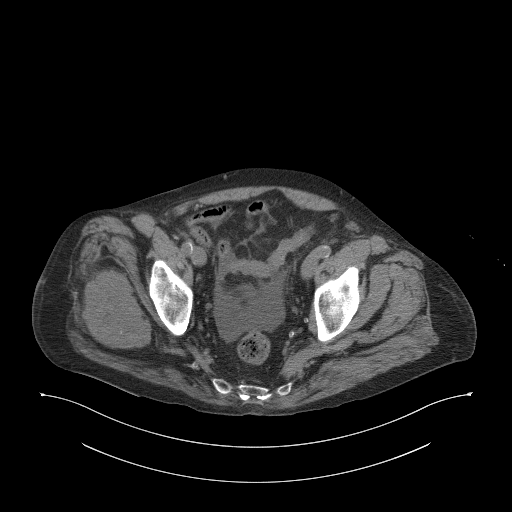
[im 21/46  soft-tissue]
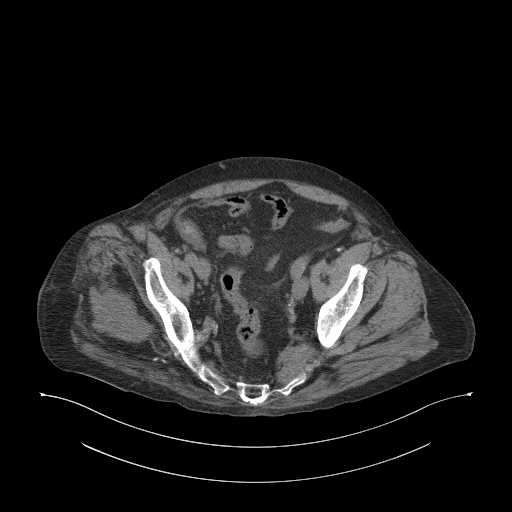
[im 23/46  soft-tissue]
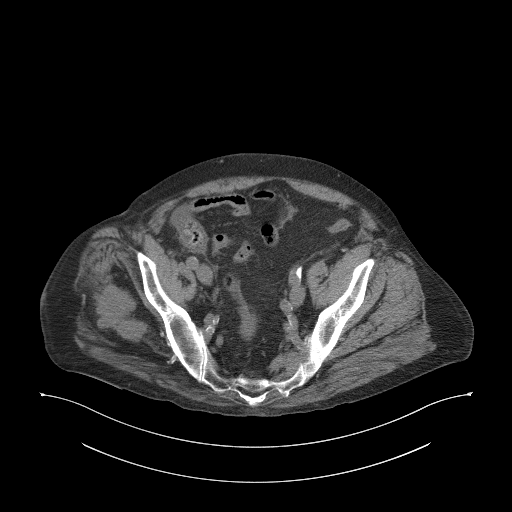
[im 26/46  soft-tissue]
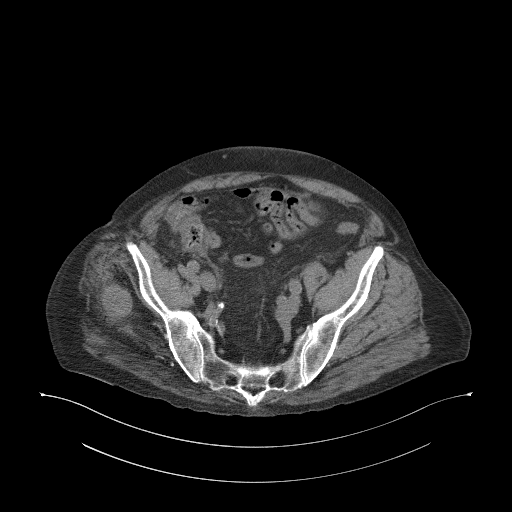
[im 31/46  soft-tissue]
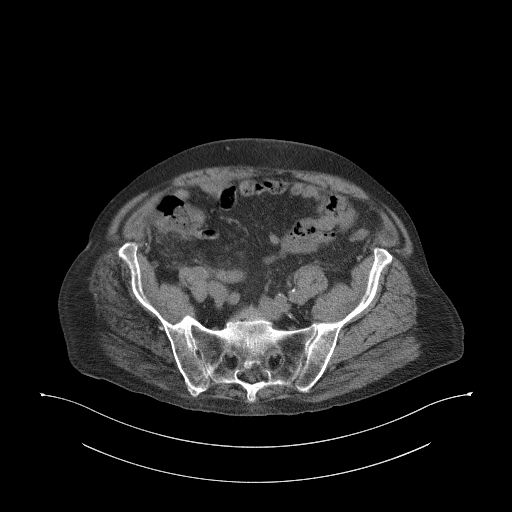
[im 31/46  bone]
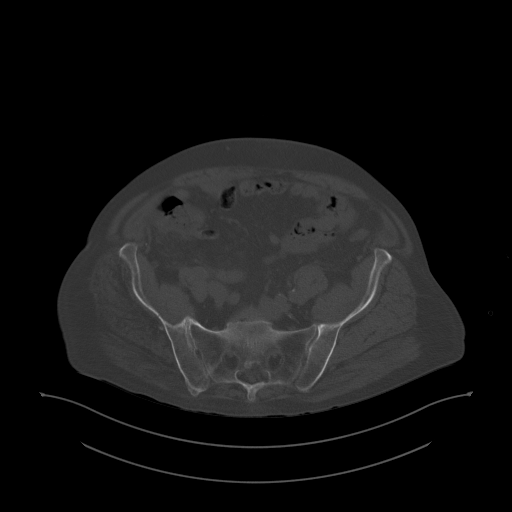
[im 33/46  soft-tissue]
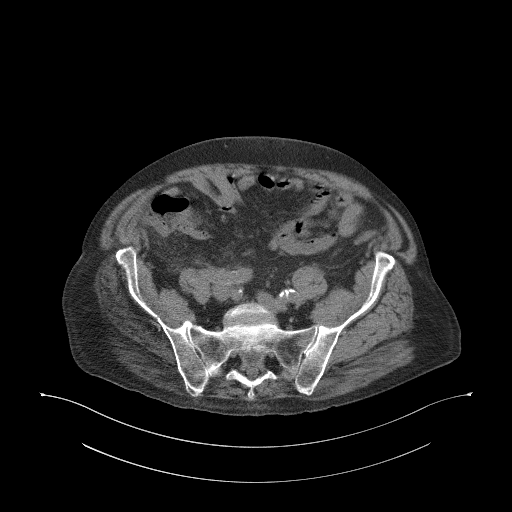
[im 36/46  soft-tissue]
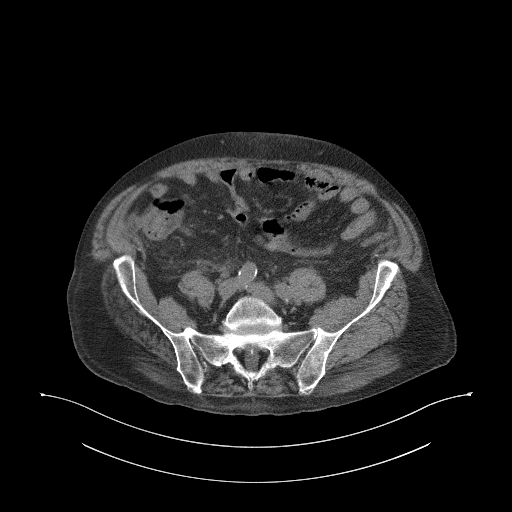
[im 36/46  lung]
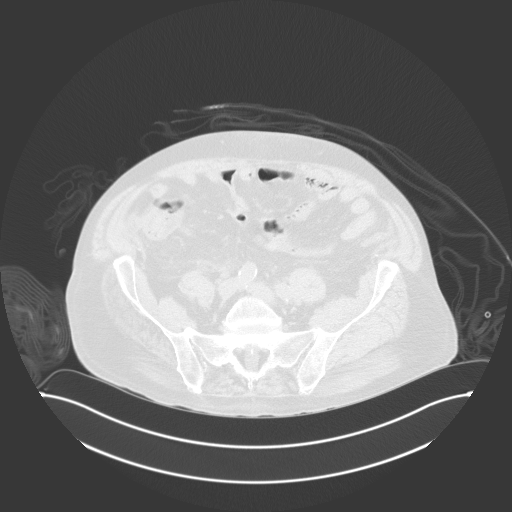
[im 38/46  lung]
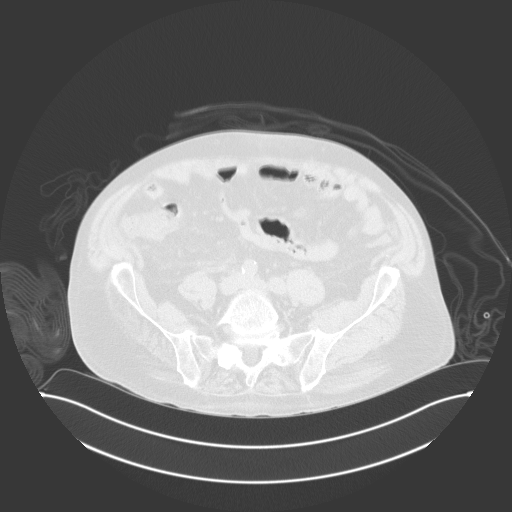
[im 41/46  soft-tissue]
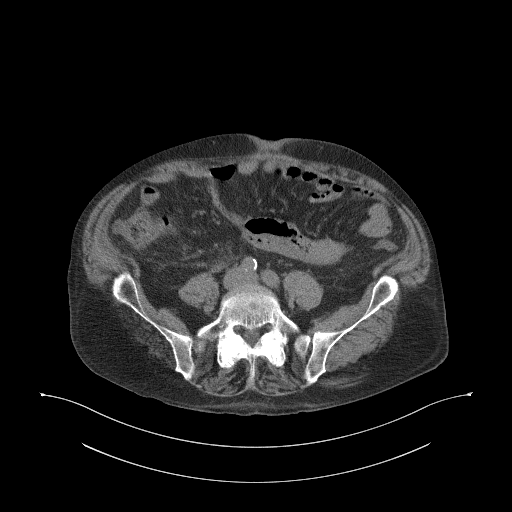
[im 41/46  lung]
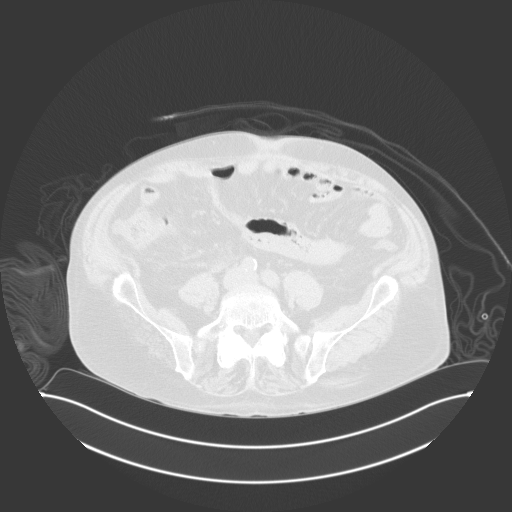
[im 43/46  soft-tissue]
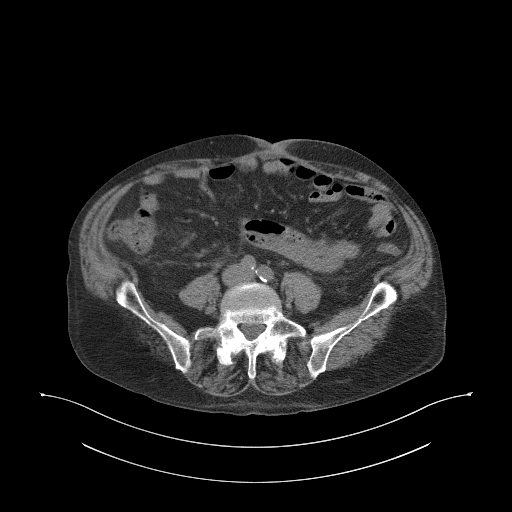
[im 43/46  lung]
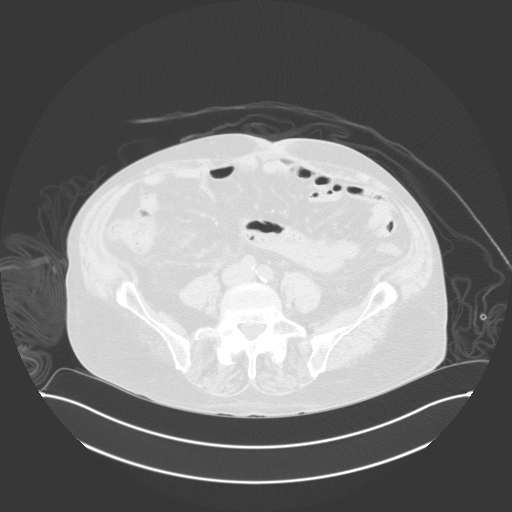

[14 of 32 positions shown; findings below may reference images not displayed]

EXAM:
CT-GUIDED RIGHT HIP MASS BIOPSY

MEDICATIONS:
None.

ANESTHESIA/SEDATION:
Moderate (conscious) sedation was employed during this procedure. A
total of Versed 4.0 mg and Fentanyl 100 mcg was administered
intravenously.

Moderate Sedation Time: 14 minutes. The patient's level of
consciousness and vital signs were monitored continuously by
radiology nursing throughout the procedure under my direct
supervision.

FLUOROSCOPY TIME:  None

COMPLICATIONS:
None immediate.

PROCEDURE:
Informed written consent was obtained from the patient after a
thorough discussion of the procedural risks, benefits and
alternatives. All questions were addressed. A timeout was performed
prior to the initiation of the procedure.

Patient was placed supine on the CT scanner. Images through the
pelvis were obtained. Large soft tissue mass involving the proximal
right femur was identified and targeted. The overlying skin was
prepped with chlorhexidine and sterile field was created. Skin and
soft tissues were anesthetized using 1% lidocaine. Using CT
guidance, 17 gauge coaxial needle was directed into the soft tissue
mass. Multiple core biopsies were obtained with an 18 gauge device.
Specimens placed in formalin. Bandage placed over the puncture site.
FINDINGS: Large mass involving the soft tissues of the right hip and
surrounding the proximal right femur.
IMPRESSION: CT-guided core biopsies of the large right hip soft tissue mass.

## 2022-04-14 IMAGING — CT CT CHEST-ABD-PELV W/ CM
2 of 11 series · 9 of 46 positions shown, 15 images · IV contrast (APPLIED)
Comparison: Multiple exams, including 02/15/2020

CLINICAL DATA: Metastatic hepatocellular carcinoma with ongoing
chemotherapy. Prior radiation therapy to the lung.

EXAM:
CT CHEST, ABDOMEN, AND PELVIS WITH CONTRAST
TECHNIQUE: Multidetector CT imaging of the chest, abdomen and pelvis was
performed following the standard protocol during bolus
administration of intravenous contrast.
CONTRAST:  100mL OMNIPAQUE IOHEXOL 300 MG/ML  SOLN

[Series 5: coronal arterial · coronal · arterial · 0.87mm/px · 2 of 117 slices shown, 3 images]
[im 39/117  soft-tissue]
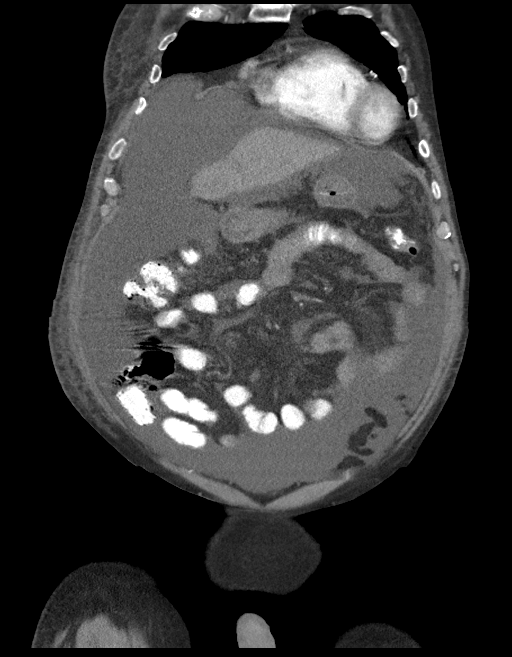
[im 39/117  bone]
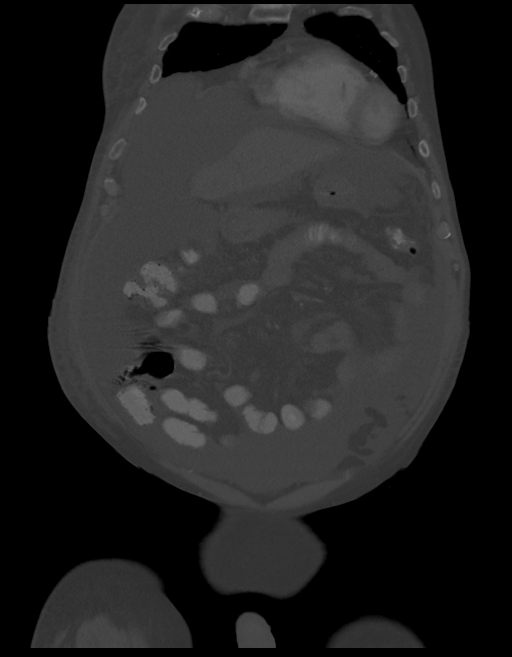
[im 78/117  soft-tissue]
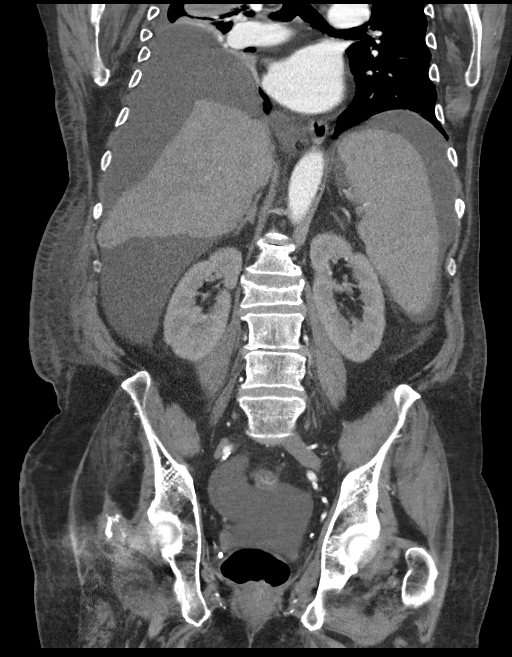

[Series 7: axial venous · axial · portal-venous · 0.86mm/px · z∈[-403,+89]mm · 7 of 220 slices shown, 12 images]
[im 28/220  soft-tissue]
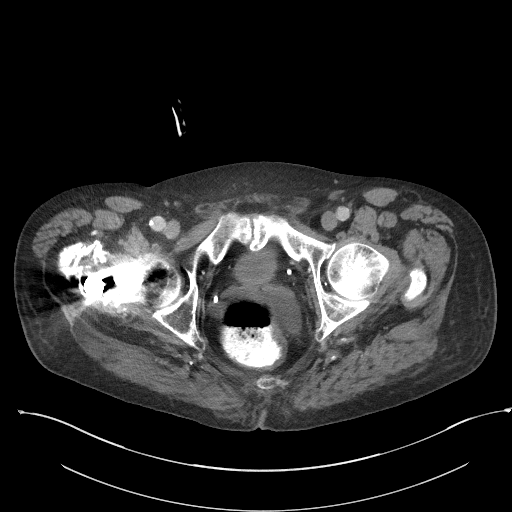
[im 28/220  bone]
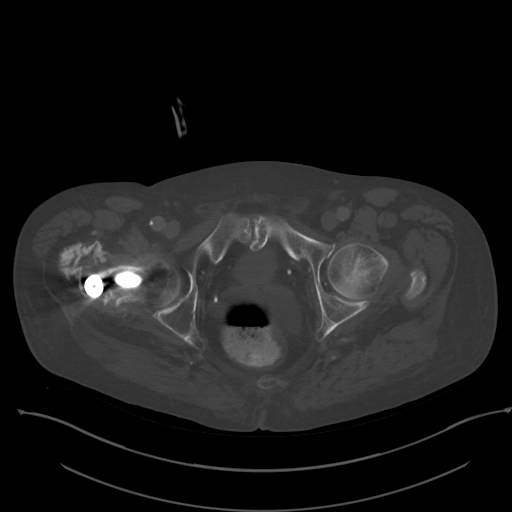
[im 55/220  soft-tissue]
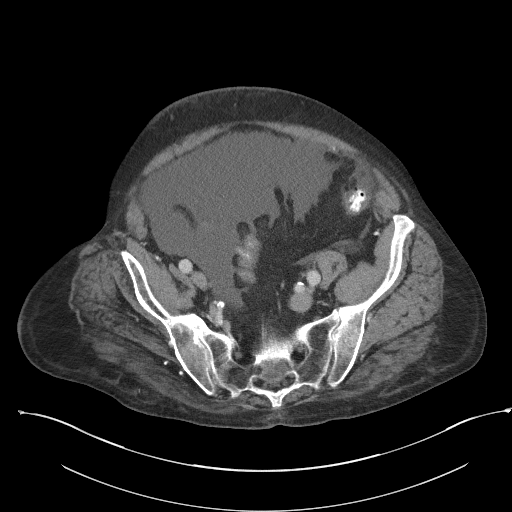
[im 83/220  soft-tissue]
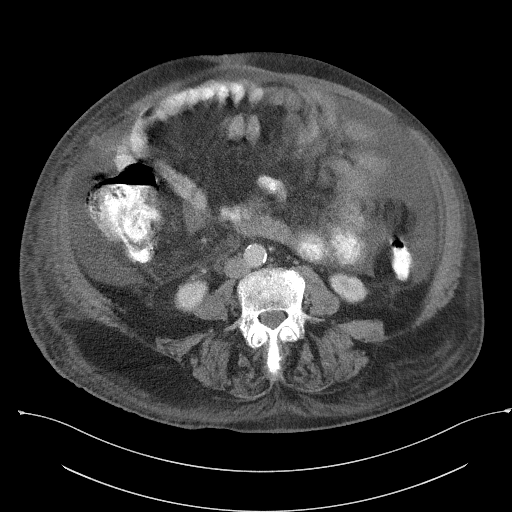
[im 110/220  soft-tissue]
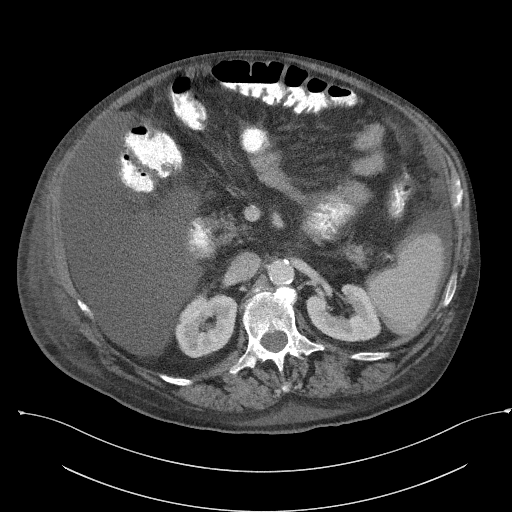
[im 110/220  lung]
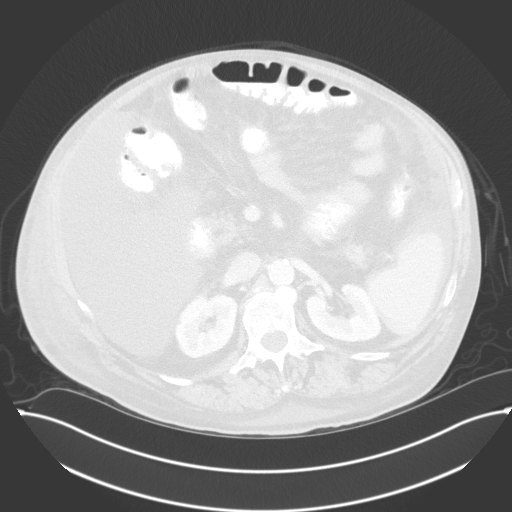
[im 137/220  soft-tissue]
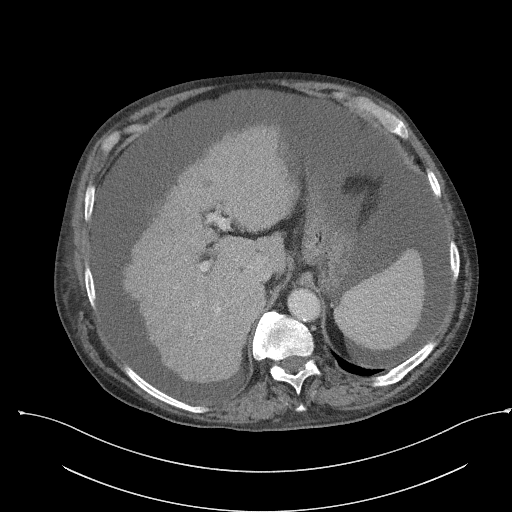
[im 137/220  lung]
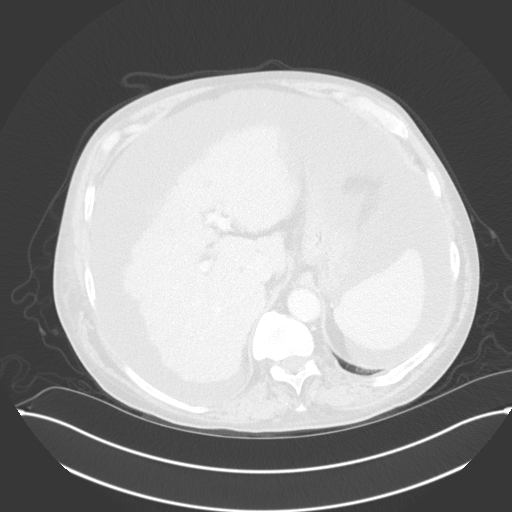
[im 165/220  soft-tissue]
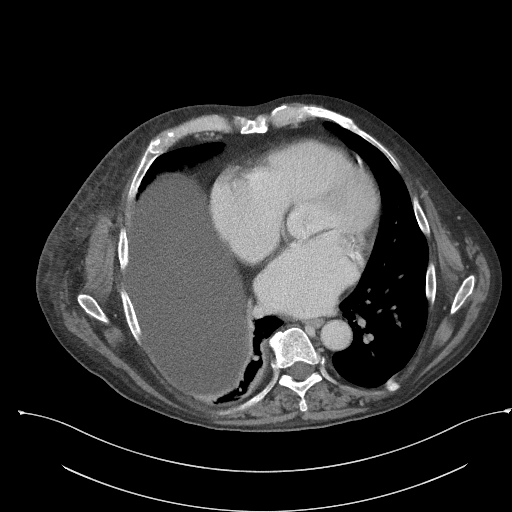
[im 165/220  lung]
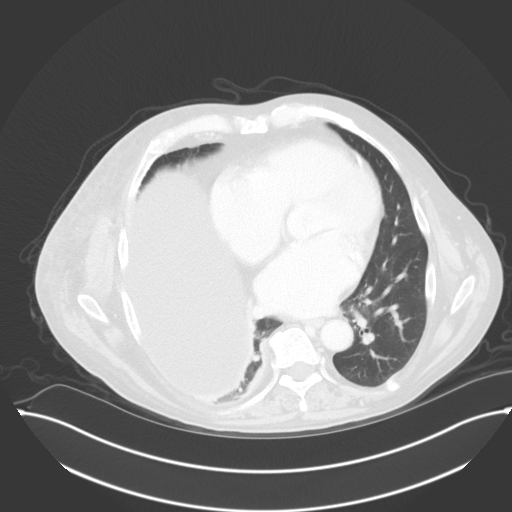
[im 192/220  soft-tissue]
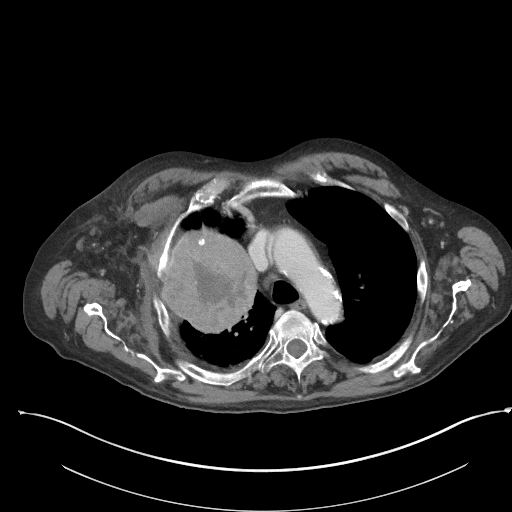
[im 192/220  lung]
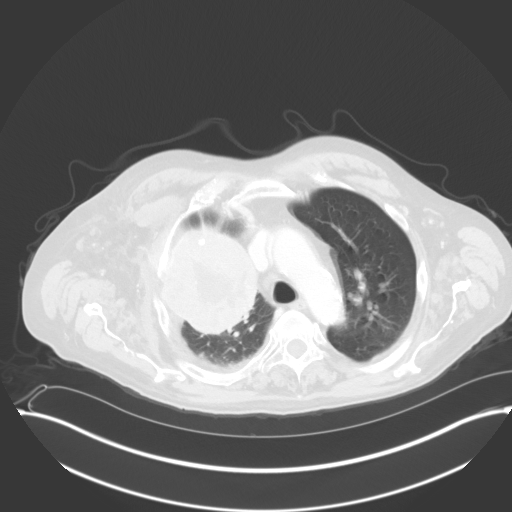

[9 of 46 positions shown; findings below may reference images not displayed]

FINDINGS: CT CHEST FINDINGS

Cardiovascular: Right upper lobe lung mass invades the chest wall
with associated destruction of the right first rib and part of the
right second rib, and extending up slightly into the lower neck as
well as along the right axilla. The mass demonstrates central
necrosis and measures 10.9 by 8.4 by 12.2 cm (volume = 580 cm^3)
on image 22 series 7, formerly 9.4 by 7.3 by 9.2 cm (volume = 330
cm^3). This abuts and displaces the subclavian and axillary
vessels and is adjacent to the SVC although without SVC narrowing at
this time. I am uncertain about patency of the right subclavian
vein.

Coronary, aortic arch, and branch vessel atherosclerotic vascular
disease. Moderate cardiomegaly. Mitral valve calcification.

Mediastinum/Nodes: Lower paratracheal node at the level of the
carina 1.1 cm in short axis on image 35 series 7, stable. Small
amount of fluid adjacent to the distal esophagus, possibly having
extended up through the hiatus.

Lungs/Pleura: Right upper lobe mass invading the right upper chest
wall as detailed in the cardiovascular section above. There is
associated passive atelectasis as well as increasing atelectasis in
the right middle lobe and right lower lobe. Elevated right
hemidiaphragm. Small right pleural effusion.

Musculoskeletal: Bony destruction of the right first rib due to the
right apical mass. There is some sclerosis in the right second rib
along its margin with the mass potentially reflecting early bony
involvement of the second rib for example on image 67 series 10. The
mass abuts the right clavicle without visible clavicular erosion.
Lower thoracic spondylosis.

CT ABDOMEN PELVIS FINDINGS

Hepatobiliary: Cirrhosis. Stable hypodense 1.2 cm hypodense lesion
in the dome of the right hepatic lobe on image 73 series 7. Another
hypodense lesion measures 1.0 cm in diameter on image 95 of series
7, which is stable. Other smaller hypodense lesions are present in
the liver and appear stable. These correspond to lesions which were
previously larger and enhancing on 06/20/2019, and accordingly
likely represent treated hepatic metastatic disease. These lesions
are hypodense on both portal venous and arterial phase images. I not
see active arterial phase enhancing lesions to suggest active or
progressive hepatocellular carcinoma.

Small gallstones are present in the gallbladder. No biliary
dilatation.

Pancreas: Unremarkable

Spleen: Unremarkable

Adrenals/Urinary Tract: Unremarkable

Stomach/Bowel: Unremarkable

Vascular/Lymphatic: Aortoiliac atherosclerotic vascular disease.

Reproductive: Dystrophic calcifications in the prostate gland.

Other: Increased ascites. Mild mesenteric edema. No definite
nodularity or enhancement along the ascites.

Musculoskeletal: Stable remote compression fracture at L2. Lumbar
spondylosis and degenerative disc disease causing multilevel mild
impingement. Right hip ORIF traversing known pathologic fracture
site with appearance unchanged from prior.
IMPRESSION: 1. 75% increase in volume of the right upper lobe lung mass invading
the right upper chest wall since 02/15/2020, with associated
destruction of the right first rib and early involvement of the
right second rib. Patency of the right subclavian vein is very
questionable given the extrinsic compression.
2. Stable mild subcarinal adenopathy.
3. Stable small right pleural effusion. Increased atelectasis in the
right lung.
4. Stable appearance of the hypodense hepatic lesions representing
residua from prior multifocal hepatocellular carcinoma. No current
arterial phase enhancing lesion is identified.
5. Other imaging findings of potential clinical significance:
Coronary atherosclerosis. Moderate cardiomegaly. Mitral valve
calcification. Cirrhosis. Increased ascites. Cholelithiasis. Mild
mesenteric edema. Right hip ORIF traversing known pathologic
fracture site with appearance unchanged from prior. Stable remote
compression fracture at L2. Lumbar spondylosis and degenerative disc
disease causing multilevel mild impingement.
6. Aortic atherosclerosis.

Aortic Atherosclerosis ([id number]).
